# Patient Record
Sex: Male | Born: 1965
Health system: Southern US, Community
[De-identification: ages and names within clinical notes are randomized; demographics above are authoritative.]

## PROBLEM LIST (undated history)

## (undated) DIAGNOSIS — E119 Type 2 diabetes mellitus without complications: Secondary | ICD-10-CM

## (undated) DIAGNOSIS — I10 Essential (primary) hypertension: Secondary | ICD-10-CM

## (undated) DIAGNOSIS — K5090 Crohn's disease, unspecified, without complications: Secondary | ICD-10-CM

## (undated) HISTORY — DX: Crohn's disease, unspecified, without complications: K50.90

## (undated) HISTORY — PX: FINGER AMPUTATION: SHX636

## (undated) HISTORY — PX: SCROTAL SURGERY: SHX2387

---

## 2001-01-10 ENCOUNTER — Emergency Department (HOSPITAL_COMMUNITY): Admission: EM | Admit: 2001-01-10 | Discharge: 2001-01-10 | Payer: Self-pay | Admitting: Emergency Medicine

## 2001-01-10 ENCOUNTER — Encounter: Payer: Self-pay | Admitting: Emergency Medicine

## 2001-12-06 ENCOUNTER — Emergency Department (HOSPITAL_COMMUNITY): Admission: EM | Admit: 2001-12-06 | Discharge: 2001-12-06 | Payer: Self-pay | Admitting: Emergency Medicine

## 2002-03-08 ENCOUNTER — Inpatient Hospital Stay (HOSPITAL_COMMUNITY): Admission: EM | Admit: 2002-03-08 | Discharge: 2002-03-10 | Payer: Self-pay | Admitting: Emergency Medicine

## 2002-03-08 ENCOUNTER — Encounter: Payer: Self-pay | Admitting: Emergency Medicine

## 2002-03-16 ENCOUNTER — Emergency Department (HOSPITAL_COMMUNITY): Admission: EM | Admit: 2002-03-16 | Discharge: 2002-03-17 | Payer: Self-pay

## 2002-03-17 ENCOUNTER — Encounter: Payer: Self-pay | Admitting: Emergency Medicine

## 2002-03-21 ENCOUNTER — Emergency Department (HOSPITAL_COMMUNITY): Admission: EM | Admit: 2002-03-21 | Discharge: 2002-03-21 | Payer: Self-pay | Admitting: Emergency Medicine

## 2002-03-23 ENCOUNTER — Emergency Department (HOSPITAL_COMMUNITY): Admission: EM | Admit: 2002-03-23 | Discharge: 2002-03-23 | Payer: Self-pay | Admitting: Emergency Medicine

## 2002-03-27 ENCOUNTER — Encounter: Admission: RE | Admit: 2002-03-27 | Discharge: 2002-03-27 | Payer: Self-pay | Admitting: Family Medicine

## 2002-03-30 ENCOUNTER — Encounter: Admission: RE | Admit: 2002-03-30 | Discharge: 2002-03-30 | Payer: Self-pay | Admitting: Family Medicine

## 2002-04-24 ENCOUNTER — Ambulatory Visit (HOSPITAL_COMMUNITY): Admission: RE | Admit: 2002-04-24 | Discharge: 2002-04-24 | Payer: Self-pay | Admitting: Sports Medicine

## 2002-06-27 ENCOUNTER — Encounter: Admission: RE | Admit: 2002-06-27 | Discharge: 2002-06-27 | Payer: Self-pay | Admitting: Family Medicine

## 2002-07-10 ENCOUNTER — Encounter: Admission: RE | Admit: 2002-07-10 | Discharge: 2002-10-08 | Payer: Self-pay | Admitting: Family Medicine

## 2006-06-18 ENCOUNTER — Emergency Department (HOSPITAL_COMMUNITY): Admission: EM | Admit: 2006-06-18 | Discharge: 2006-06-18 | Payer: Self-pay | Admitting: Emergency Medicine

## 2006-11-15 DIAGNOSIS — K5090 Crohn's disease, unspecified, without complications: Secondary | ICD-10-CM

## 2006-11-15 HISTORY — DX: Crohn's disease, unspecified, without complications: K50.90

## 2009-01-27 ENCOUNTER — Encounter: Admission: RE | Admit: 2009-01-27 | Discharge: 2009-01-27 | Payer: Self-pay | Admitting: Gastroenterology

## 2009-09-13 ENCOUNTER — Emergency Department (HOSPITAL_COMMUNITY): Admission: EM | Admit: 2009-09-13 | Discharge: 2009-09-13 | Payer: Self-pay | Admitting: Emergency Medicine

## 2010-09-07 ENCOUNTER — Inpatient Hospital Stay (HOSPITAL_COMMUNITY): Admission: EM | Admit: 2010-09-07 | Discharge: 2010-09-10 | Payer: Self-pay | Admitting: Emergency Medicine

## 2010-10-16 ENCOUNTER — Emergency Department (HOSPITAL_COMMUNITY)
Admission: EM | Admit: 2010-10-16 | Discharge: 2010-10-16 | Payer: Self-pay | Source: Home / Self Care | Admitting: Emergency Medicine

## 2010-12-09 ENCOUNTER — Emergency Department (HOSPITAL_COMMUNITY)
Admission: EM | Admit: 2010-12-09 | Discharge: 2010-12-09 | Payer: Self-pay | Source: Home / Self Care | Admitting: Emergency Medicine

## 2010-12-09 LAB — RAPID STREP SCREEN (MED CTR MEBANE ONLY): Streptococcus, Group A Screen (Direct): NEGATIVE

## 2010-12-14 ENCOUNTER — Emergency Department (HOSPITAL_COMMUNITY)
Admission: EM | Admit: 2010-12-14 | Discharge: 2010-12-15 | Payer: Self-pay | Source: Home / Self Care | Admitting: Emergency Medicine

## 2011-01-26 LAB — GLUCOSE, CAPILLARY: Glucose-Capillary: 92 mg/dL (ref 70–99)

## 2011-01-27 LAB — POCT I-STAT, CHEM 8
BUN: 9 mg/dL (ref 6–23)
Calcium, Ion: 1.15 mmol/L (ref 1.12–1.32)
Chloride: 108 mEq/L (ref 96–112)
Creatinine, Ser: 0.9 mg/dL (ref 0.4–1.5)
Glucose, Bld: 188 mg/dL — ABNORMAL HIGH (ref 70–99)
HCT: 51 % (ref 39.0–52.0)
Hemoglobin: 17.3 g/dL — ABNORMAL HIGH (ref 13.0–17.0)
Potassium: 3.4 mEq/L — ABNORMAL LOW (ref 3.5–5.1)
Sodium: 143 mEq/L (ref 135–145)
TCO2: 24 mmol/L (ref 0–100)

## 2011-01-27 LAB — GLUCOSE, CAPILLARY
Glucose-Capillary: 104 mg/dL — ABNORMAL HIGH (ref 70–99)
Glucose-Capillary: 112 mg/dL — ABNORMAL HIGH (ref 70–99)
Glucose-Capillary: 147 mg/dL — ABNORMAL HIGH (ref 70–99)
Glucose-Capillary: 150 mg/dL — ABNORMAL HIGH (ref 70–99)
Glucose-Capillary: 153 mg/dL — ABNORMAL HIGH (ref 70–99)
Glucose-Capillary: 157 mg/dL — ABNORMAL HIGH (ref 70–99)
Glucose-Capillary: 163 mg/dL — ABNORMAL HIGH (ref 70–99)
Glucose-Capillary: 172 mg/dL — ABNORMAL HIGH (ref 70–99)
Glucose-Capillary: 180 mg/dL — ABNORMAL HIGH (ref 70–99)
Glucose-Capillary: 183 mg/dL — ABNORMAL HIGH (ref 70–99)
Glucose-Capillary: 188 mg/dL — ABNORMAL HIGH (ref 70–99)
Glucose-Capillary: 191 mg/dL — ABNORMAL HIGH (ref 70–99)
Glucose-Capillary: 192 mg/dL — ABNORMAL HIGH (ref 70–99)
Glucose-Capillary: 193 mg/dL — ABNORMAL HIGH (ref 70–99)
Glucose-Capillary: 199 mg/dL — ABNORMAL HIGH (ref 70–99)
Glucose-Capillary: 199 mg/dL — ABNORMAL HIGH (ref 70–99)
Glucose-Capillary: 208 mg/dL — ABNORMAL HIGH (ref 70–99)

## 2011-01-27 LAB — COMPREHENSIVE METABOLIC PANEL
ALT: 16 U/L (ref 0–53)
ALT: 18 U/L (ref 0–53)
AST: 14 U/L (ref 0–37)
AST: 19 U/L (ref 0–37)
Albumin: 2.8 g/dL — ABNORMAL LOW (ref 3.5–5.2)
Albumin: 3.7 g/dL (ref 3.5–5.2)
Alkaline Phosphatase: 63 U/L (ref 39–117)
Alkaline Phosphatase: 90 U/L (ref 39–117)
BUN: 10 mg/dL (ref 6–23)
BUN: 10 mg/dL (ref 6–23)
CO2: 24 mEq/L (ref 19–32)
CO2: 25 mEq/L (ref 19–32)
Calcium: 8.1 mg/dL — ABNORMAL LOW (ref 8.4–10.5)
Calcium: 9.2 mg/dL (ref 8.4–10.5)
Chloride: 107 mEq/L (ref 96–112)
Chloride: 108 mEq/L (ref 96–112)
Creatinine, Ser: 0.84 mg/dL (ref 0.4–1.5)
Creatinine, Ser: 0.87 mg/dL (ref 0.4–1.5)
GFR calc Af Amer: >60 mL/min (ref >60)
GFR calc Af Amer: >60 mL/min (ref >60)
GFR calc non Af Amer: >60 mL/min (ref >60)
GFR calc non Af Amer: >60 mL/min (ref >60)
Glucose, Bld: 194 mg/dL — ABNORMAL HIGH (ref 70–99)
Glucose, Bld: 216 mg/dL — ABNORMAL HIGH (ref 70–99)
Potassium: 3.3 mEq/L — ABNORMAL LOW (ref 3.5–5.1)
Potassium: 3.8 mEq/L (ref 3.5–5.1)
Sodium: 139 mEq/L (ref 135–145)
Sodium: 139 mEq/L (ref 135–145)
Total Bilirubin: 0.4 mg/dL (ref 0.3–1.2)
Total Bilirubin: 0.5 mg/dL (ref 0.3–1.2)
Total Protein: 6.2 g/dL (ref 6.0–8.3)
Total Protein: 7.8 g/dL (ref 6.0–8.3)

## 2011-01-27 LAB — SEDIMENTATION RATE: Sed Rate: 29 mm/hr — ABNORMAL HIGH (ref 0–16)

## 2011-01-27 LAB — BASIC METABOLIC PANEL
BUN: 8 mg/dL (ref 6–23)
BUN: 9 mg/dL (ref 6–23)
CO2: 25 mEq/L (ref 19–32)
CO2: 27 mEq/L (ref 19–32)
Calcium: 8 mg/dL — ABNORMAL LOW (ref 8.4–10.5)
Calcium: 8.6 mg/dL (ref 8.4–10.5)
Chloride: 108 mEq/L (ref 96–112)
Chloride: 108 mEq/L (ref 96–112)
Creatinine, Ser: 0.8 mg/dL (ref 0.4–1.5)
Creatinine, Ser: 0.82 mg/dL (ref 0.4–1.5)
GFR calc Af Amer: >60 mL/min (ref >60)
GFR calc Af Amer: >60 mL/min (ref >60)
GFR calc non Af Amer: >60 mL/min (ref >60)
GFR calc non Af Amer: >60 mL/min (ref >60)
Glucose, Bld: 154 mg/dL — ABNORMAL HIGH (ref 70–99)
Glucose, Bld: 163 mg/dL — ABNORMAL HIGH (ref 70–99)
Potassium: 3.6 mEq/L (ref 3.5–5.1)
Potassium: 4 mEq/L (ref 3.5–5.1)
Sodium: 141 mEq/L (ref 135–145)
Sodium: 141 mEq/L (ref 135–145)

## 2011-01-27 LAB — LACTIC ACID, PLASMA
Lactic Acid, Venous: 1.1 mmol/L (ref 0.5–2.2)
Lactic Acid, Venous: 2.5 mmol/L — ABNORMAL HIGH (ref 0.5–2.2)

## 2011-01-27 LAB — URINALYSIS, ROUTINE W REFLEX MICROSCOPIC
Glucose, UA: 250 mg/dL — AB
Ketones, ur: 15 mg/dL — AB
Leukocytes, UA: NEGATIVE
Nitrite: NEGATIVE
Protein, ur: 30 mg/dL — AB
Specific Gravity, Urine: 1.038 — ABNORMAL HIGH (ref 1.005–1.030)
Urobilinogen, UA: 1 mg/dL (ref 0.0–1.0)
pH: 5.5 (ref 5.0–8.0)

## 2011-01-27 LAB — CBC
HCT: 39.7 % (ref 39.0–52.0)
HCT: 41.3 % (ref 39.0–52.0)
HCT: 41.5 % (ref 39.0–52.0)
HCT: 47.7 % (ref 39.0–52.0)
Hemoglobin: 13.3 g/dL (ref 13.0–17.0)
Hemoglobin: 13.8 g/dL (ref 13.0–17.0)
Hemoglobin: 14 g/dL (ref 13.0–17.0)
Hemoglobin: 16.1 g/dL (ref 13.0–17.0)
MCH: 28.8 pg (ref 26.0–34.0)
MCH: 29.1 pg (ref 26.0–34.0)
MCH: 29.1 pg (ref 26.0–34.0)
MCH: 29.2 pg (ref 26.0–34.0)
MCHC: 33.1 g/dL (ref 30.0–36.0)
MCHC: 33.6 g/dL (ref 30.0–36.0)
MCHC: 33.7 g/dL (ref 30.0–36.0)
MCHC: 33.9 g/dL (ref 30.0–36.0)
MCV: 86.2 fL (ref 78.0–100.0)
MCV: 86.3 fL (ref 78.0–100.0)
MCV: 86.8 fL (ref 78.0–100.0)
MCV: 87 fL (ref 78.0–100.0)
Platelets: 259 10*3/uL (ref 150–400)
Platelets: 270 10*3/uL (ref 150–400)
Platelets: 274 10*3/uL (ref 150–400)
Platelets: 286 10*3/uL (ref 150–400)
RBC: 4.57 MIL/uL (ref 4.22–5.81)
RBC: 4.77 MIL/uL (ref 4.22–5.81)
RBC: 4.79 MIL/uL (ref 4.22–5.81)
RBC: 5.53 MIL/uL (ref 4.22–5.81)
RDW: 14.7 % (ref 11.5–15.5)
RDW: 15.1 % (ref 11.5–15.5)
RDW: 15.3 % (ref 11.5–15.5)
RDW: 15.4 % (ref 11.5–15.5)
WBC: 10.4 10*3/uL (ref 4.0–10.5)
WBC: 10.7 10*3/uL — ABNORMAL HIGH (ref 4.0–10.5)
WBC: 15.5 10*3/uL — ABNORMAL HIGH (ref 4.0–10.5)
WBC: 9.1 10*3/uL (ref 4.0–10.5)

## 2011-01-27 LAB — LIPASE, BLOOD: Lipase: 28 U/L (ref 11–59)

## 2011-01-27 LAB — TYPE AND SCREEN
ABO/RH(D): B POS
Antibody Screen: NEGATIVE

## 2011-01-27 LAB — PROTIME-INR
INR: 1.02 (ref 0.00–1.49)
INR: 1.15 (ref 0.00–1.49)
Prothrombin Time: 13.6 seconds (ref 11.6–15.2)
Prothrombin Time: 14.9 seconds (ref 11.6–15.2)

## 2011-01-27 LAB — MAGNESIUM: Magnesium: 2 mg/dL (ref 1.5–2.5)

## 2011-01-27 LAB — URINE MICROSCOPIC-ADD ON

## 2011-01-27 LAB — DIFFERENTIAL
Basophils Absolute: 0 10*3/uL (ref 0.0–0.1)
Basophils Relative: 0 % (ref 0–1)
Eosinophils Absolute: 0 10*3/uL (ref 0.0–0.7)
Eosinophils Relative: 0 % (ref 0–5)
Lymphocytes Relative: 5 % — ABNORMAL LOW (ref 12–46)
Lymphs Abs: 0.8 10*3/uL (ref 0.7–4.0)
Monocytes Absolute: 0.8 10*3/uL (ref 0.1–1.0)
Monocytes Relative: 5 % (ref 3–12)
Neutro Abs: 13.8 10*3/uL — ABNORMAL HIGH (ref 1.7–7.7)
Neutrophils Relative %: 89 % — ABNORMAL HIGH (ref 43–77)

## 2011-01-27 LAB — HEMOGLOBIN A1C
Hgb A1c MFr Bld: 8.6 % — ABNORMAL HIGH (ref <5.7)
Mean Plasma Glucose: 200 mg/dL — ABNORMAL HIGH (ref <117)

## 2011-01-27 LAB — APTT: aPTT: 28 seconds (ref 24–37)

## 2011-01-27 LAB — ABO/RH: ABO/RH(D): B POS
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
## 2011-04-02 NOTE — Discharge Summary (Signed)
[place]. [facility]  Patient:    Jared [name], Jared [name] Visit Number: [id number] MRN: [id number]          Service Type: MED Location: [place] Attending Physician:  Tiburcio Pea Dictated by:   Early Chars. Waymon Amato, M.D. Admit Date:  03/08/2002 Discharge Date: 03/10/2002   CC:         Kelle Darting, M.D.   Discharge Summary  DATE OF BIRTH:  12-09-1965.  DISCHARGE DIAGNOSES: 1. Headache. 2. Viral meningitis. 3. Questionable elevated glucose with family history of diabetes.  PROCEDURES:  Patient underwent a head CT scan which was negative on March 08, 2002 and a lumbar puncture with results as reported below.  DISCHARGE MEDICATIONS: 1. Ibuprofen 800 mg one tablet p.o. q.6-8h. p.r.n. with food. 2. Vicodin ES one tablet p.o. q.6h. p.r.n. as needed for pain. 3. Ultram 75 mg one tablet p.o. q.6-8h. p.r.n. pain.  ACTIVITY:  Bedrest, no heavy lifting, no straining or vigorous exercise for three to four days or until well.  HOSPITAL FOLLOW UP:  The patient is instructed to call [facility] for an appointment with either Dr. Anitra Lauth or Dr. Waymon Amato in four to five business days.  If patient worsens he is to contact [facility] or return to the emergency department at that time.  BRIEF HISTORY OF PRESENT ILLNESS:  The patient is a 45 year old African-American male who presented to [facility] emergency department with headache times two days that was progressively worsening. Upon arrival to the emergency department he rated the pain as 20:10, felt like pain going through his eyeballs and his neck.  He did admit to mild photophobia but no other neurological changes by his report.  The patient was evaluated  and it was considered that he potentially had an acute neurological change consistent with interventricular or intervascular hemorrhage or meningitis versus stroke, etc.  LABORATORY DATA:  Initial blood  work showed normal BMP except for glucose of 138.  Blood cultures were drawn.  CSF showed no organisms, no red blood cells, 345 white blood cells, 82% lymphocytes, protein was 65 and glucose 75.  The patient was thus admitted to [place] for observation and awaiting culture return.  HOSPITAL COURSE: #1 - HEADACHE:  By the day of discharge the patient reports his pain is still about 5 to 6:10, however, it was improved when he was given proper pain control.  The patient was controlled on morphine until yesterday when he was switched over to Percocet.  He did not feel the Percocet was adequately cont6rolling his pain.  Cultures had not returned by date of discharge, results are still pending.  The patients white blood cell count was 9.3 on admission and initial CSF cultures showed no organisms present at 24 hours and thus it was felt the patient was a good candidate for discharge.  #2 - ELEVATED GLUCOSE LEVEL:  On admission the patient had a glucose level of 138 on his BMP, was 136 on comprehensive metabolic panel later that evening and he had a value of 151 on March 09, 2002 at 4 a.m. when he was fasting which is suggestive of diabetes mellitus or glucose intolerance consistent with the patients family history of multiple members having diabetes mellitus. DISCHARGE HISTORY AND PHYSICAL EXAMINATION:  The patient is doing well today. He tolerated p.o. food and p.o. pain medications fairly well considering his viral meningitis.  DISPOSITION:  The patient will be  discharge in stable condition today to home and will have follow up with [facility].  The patient is given adequate prescriptions and will be added in Ultram 75 mg one p.o. q.6-8h. p.r.n. pain.  He will be given #15 of the Ultram and #30 of the ibuprofen, #30 of Vicodin. Dictated by:   Early Chars. Waymon Amato, M.D. Attending Physician:  Tiburcio Pea DD:  03/10/02 TD:   03/12/02 Job: [phone]

## 2011-07-14 ENCOUNTER — Emergency Department (HOSPITAL_COMMUNITY): Payer: Self-pay

## 2011-07-14 ENCOUNTER — Inpatient Hospital Stay (HOSPITAL_COMMUNITY)
Admission: EM | Admit: 2011-07-14 | Discharge: 2011-07-16 | DRG: 386 | Disposition: A | Discharge status: Home / Self Care | Payer: Self-pay | Attending: Internal Medicine | Admitting: Internal Medicine

## 2011-07-14 DIAGNOSIS — F17200 Nicotine dependence, unspecified, uncomplicated: Secondary | ICD-10-CM | POA: Diagnosis present

## 2011-07-14 DIAGNOSIS — E119 Type 2 diabetes mellitus without complications: Secondary | ICD-10-CM | POA: Diagnosis present

## 2011-07-14 DIAGNOSIS — K5669 Other intestinal obstruction: Secondary | ICD-10-CM | POA: Diagnosis present

## 2011-07-14 DIAGNOSIS — K56609 Unspecified intestinal obstruction, unspecified as to partial versus complete obstruction: Secondary | ICD-10-CM

## 2011-07-14 DIAGNOSIS — K5000 Crohn's disease of small intestine without complications: Principal | ICD-10-CM | POA: Diagnosis present

## 2011-07-14 DIAGNOSIS — K5090 Crohn's disease, unspecified, without complications: Secondary | ICD-10-CM

## 2011-07-14 DIAGNOSIS — E871 Hypo-osmolality and hyponatremia: Secondary | ICD-10-CM | POA: Diagnosis present

## 2011-07-14 LAB — GLUCOSE, CAPILLARY
Glucose-Capillary: 221 mg/dL — ABNORMAL HIGH (ref 70–99)
Glucose-Capillary: 103 mg/dL — ABNORMAL HIGH (ref 70–99)
Glucose-Capillary: 107 mg/dL — ABNORMAL HIGH (ref 70–99)

## 2011-07-14 LAB — CBC
HCT: 44.5 % (ref 39.0–52.0)
Hemoglobin: 15.1 g/dL (ref 13.0–17.0)
MCH: 28.6 pg (ref 26.0–34.0)
MCHC: 33.9 g/dL (ref 30.0–36.0)
MCV: 84.3 fL (ref 78.0–100.0)
Platelets: 298 10*3/uL (ref 150–400)
RBC: 5.28 MIL/uL (ref 4.22–5.81)
RDW: 13.4 % (ref 11.5–15.5)
WBC: 11.9 10*3/uL — ABNORMAL HIGH (ref 4.0–10.5)

## 2011-07-14 LAB — DIFFERENTIAL
Basophils Absolute: 0 10*3/uL (ref 0.0–0.1)
Basophils Relative: 0 % (ref 0–1)
Eosinophils Absolute: 0 10*3/uL (ref 0.0–0.7)
Eosinophils Relative: 0 % (ref 0–5)
Lymphocytes Relative: 17 % (ref 12–46)
Lymphs Abs: 2 10*3/uL (ref 0.7–4.0)
Monocytes Absolute: 1.1 10*3/uL — ABNORMAL HIGH (ref 0.1–1.0)
Monocytes Relative: 9 % (ref 3–12)
Neutro Abs: 8.8 10*3/uL — ABNORMAL HIGH (ref 1.7–7.7)
Neutrophils Relative %: 74 % (ref 43–77)

## 2011-07-14 LAB — HEPATIC FUNCTION PANEL
ALT: 37 U/L (ref 0–53)
AST: 17 U/L (ref 0–37)
Albumin: 3.4 g/dL — ABNORMAL LOW (ref 3.5–5.2)
Alkaline Phosphatase: 95 U/L (ref 39–117)
Bilirubin, Direct: 0.1 mg/dL (ref 0.0–0.3)
Indirect Bilirubin: 0.3 mg/dL (ref 0.3–0.9)
Total Bilirubin: 0.4 mg/dL (ref 0.3–1.2)
Total Protein: 7.3 g/dL (ref 6.0–8.3)

## 2011-07-14 LAB — LIPASE, BLOOD: Lipase: 34 U/L (ref 11–59)

## 2011-07-14 LAB — LACTIC ACID, PLASMA: Lactic Acid, Venous: 1.1 mmol/L (ref 0.5–2.2)

## 2011-07-14 NOTE — H&P (Signed)
Jared [name], Jared [name]               ACCOUNT NO.:  [id number]  MEDICAL RECORD NO.:  [id number]  LOCATION:  WLED                         FACILITY:  [facility]  PHYSICIAN:  Quintella Baton, MD      DATE OF BIRTH:  1966-01-27  DATE OF ADMISSION:  07/14/2011 DATE OF DISCHARGE:                             HISTORY & PHYSICAL   PCP:  None.  CODE STATUS:  FULL CODE.  The patient goes to team 2.  CHIEF COMPLAINTS:  Abdominal pain.  HISTORY OF PRESENT ILLNESS:  This is a 45 year old gentleman who has a history of Crohn disease as well as a history of small bowel obstruction once, approximately a year ago.  He states that today he developed abdominal pain and bloating, it was present all day; however, over the last couple of hours this has got severe, rated 10/10, sharp, crampy, and intermittent.  He had no nausea, no vomiting, no diarrhea, no fevers, no chills.  He states that the pain was epigastric in location and radiated down to suprapubic area centrally.  He finally decided to come to the ER.  He is on no medication for Crohn disease.  He does not follow with a gastroenterologist.  History obtained from the patient who appears reliable.  PAST MEDICAL HISTORY: 1. Crohn disease. 2. Diabetes mellitus.  PAST SURGICAL HISTORY:  None.  MEDICATIONS:  Metformin.  ALLERGIES:  No known drug allergies.  SOCIAL HISTORY:  Positive for tobacco.  Negative alcohol or illicit drugs.  No home oxygen.  Does use a walker, a cane.  FAMILY HISTORY:  Significant for diabetes mellitus and hypertension.  REVIEW OF SYSTEMS:  All 10-point systems reviewed and negative except as noted in HPI.  PHYSICAL EXAMINATION:  VITAL SIGNS:  Blood pressure 111/67, pulse 79, respirations 24, temperature 97.5, and satting 100% on room air. GENERAL:  Alert and oriented male in mild distress. HEENT:  Eyes, pink conjunctiva.  PERRLA.  ENT, moist oral mucosa. Trachea midline. NECK:  Supple.  No thyromegaly. LUNGS:   Clear to auscultation bilaterally.  No use of accessory muscles. No wheezing. CARDIOVASCULAR:  Regular rate and rhythm without murmurs, rigors, or gallops.  No JVD. ABDOMEN:  Soft.  No bowel sounds appreciated, slightly distended, positive tenderness to palpation.  Generally, no guarding and rebound, not acute surgical abdomen. NEURO:  Cranial nerves II through XII grossly intact.  Sensation intact. MUSCULOSKELETAL:  Strength 5/5 in all extremities.  No clubbing, cyanosis, or edema. SKIN:  No rash.  No subcutaneous crepitation.  LABORATORY DATA:  Lipase 34.  LFTs are normal.  Lactic acid 1.1.  White blood count 7.9, hemoglobin 15.1, and platelets 298.  KUB:  Dilated small bowel sounds, concerning for small bowel obstruction.  ASSESSMENT AND PLAN: 1. Abdominal pain. 2. Likely small bowel obstruction.  The patient will be admitted,     nothing by mouth, intravenous fluids, pain medications will be     ordered.  Dr. Lucia Gaskins, the surgeon will be called by the emergency     room physician. 3. History of Crohn disease.  The patient is on no medications for his     Crohn disease.  However, this seems to be stable.  A CAT scan in     the morning, if desired by morning team. 4. Diabetes mellitus.  The patient will be n.p.o.  We will place the     patient on sliding scale insulin q.4 hours.  Metformin will be     held.          ______________________________ Quintella Baton, MD     DC/MEDQ  D:  07/14/2011  T:  07/14/2011  Job:  [id number]  Electronically Signed by Quintella Baton MD on 07/14/2011 07:44:21 PM

## 2011-07-15 ENCOUNTER — Inpatient Hospital Stay (HOSPITAL_COMMUNITY): Payer: Self-pay

## 2011-07-15 LAB — CBC
HCT: 41.7 % (ref 39.0–52.0)
Hemoglobin: 14 g/dL (ref 13.0–17.0)
MCH: 28.4 pg (ref 26.0–34.0)
MCHC: 33.6 g/dL (ref 30.0–36.0)
MCV: 84.6 fL (ref 78.0–100.0)
Platelets: 257 10*3/uL (ref 150–400)
RBC: 4.93 MIL/uL (ref 4.22–5.81)
RDW: 13.3 % (ref 11.5–15.5)
WBC: 8.2 10*3/uL (ref 4.0–10.5)

## 2011-07-15 LAB — GLUCOSE, CAPILLARY
Glucose-Capillary: 173 mg/dL — ABNORMAL HIGH (ref 70–99)
Glucose-Capillary: 184 mg/dL — ABNORMAL HIGH (ref 70–99)
Glucose-Capillary: 207 mg/dL — ABNORMAL HIGH (ref 70–99)
Glucose-Capillary: 143 mg/dL — ABNORMAL HIGH (ref 70–99)
Glucose-Capillary: 173 mg/dL — ABNORMAL HIGH (ref 70–99)
Glucose-Capillary: 224 mg/dL — ABNORMAL HIGH (ref 70–99)

## 2011-07-15 LAB — BASIC METABOLIC PANEL
BUN: 14 mg/dL (ref 6–23)
CO2: 24 mEq/L (ref 19–32)
Calcium: 8.8 mg/dL (ref 8.4–10.5)
Chloride: 101 mEq/L (ref 96–112)
Creatinine, Ser: 0.83 mg/dL (ref 0.50–1.35)
GFR calc Af Amer: >60 mL/min (ref >60)
GFR calc non Af Amer: >60 mL/min (ref >60)
Glucose, Bld: 197 mg/dL — ABNORMAL HIGH (ref 70–99)
Potassium: 4.3 mEq/L (ref 3.5–5.1)
Sodium: 134 mEq/L — ABNORMAL LOW (ref 135–145)

## 2011-07-16 LAB — GLUCOSE, CAPILLARY
Glucose-Capillary: 245 mg/dL — ABNORMAL HIGH (ref 70–99)
Glucose-Capillary: 231 mg/dL — ABNORMAL HIGH (ref 70–99)
Glucose-Capillary: 264 mg/dL — ABNORMAL HIGH (ref 70–99)

## 2011-07-20 NOTE — Discharge Summary (Signed)
Jared [name], Jared [name]               ACCOUNT NO.:  [id number]  MEDICAL RECORD NO.:  [id number]  LOCATION:  8676                         FACILITY:  [facility]  PHYSICIAN:  Jacquelynn Cree, M.D.   DATE OF BIRTH:  Feb 20, 1966  DATE OF ADMISSION:  07/14/2011 DATE OF DISCHARGE:  07/16/2011                              DISCHARGE SUMMARY   PRIMARY CARE PHYSICIAN:  Dr. Maryan Rued at [facility]  GASTROENTEROLOGIST:  Dr. Amedeo Plenty.  DISCHARGE DIAGNOSES: 1. Small bowel obstruction secondary to Crohn disease flare. 2. Crohn disease. 3. Type 2 diabetes. 4. Mild hyponatremia.  DISCHARGE MEDICATIONS: 1. Pentasa 1000 mg p.o. q.i.d. for 8 weeks. 2. Prednisone 40 mg times 1 week then 30 mg times 1 week then 20 mg     times 1 week then 10 mg times 1 week. 3. Vicodin 5/500 1-2 tablets p.o. q.6 h p.r.n. pain. 4. Lasix at preadmission dosage 1 tablet p.o. daily. 5. Metformin 850 mg p.o. b.i.d.  CONSULTATIONS:  Wonda Horner, M.D. of Gastroenterology. Dr. Redmond Pulling of General Surgery.  BRIEF ADMISSION HISTORY OF PRESENT ILLNESS:  The patient is a 45 year old male past medical history of Crohn disease of the terminal ileum, who presented to the hospital with chief complaint of abdominal pain and upon initial evaluation, was found to have a small bowel obstruction. Subsequently, he was referred to the hospitalist service for further evaluation and treatment.  For the full details, please see the dictated report done by Dr. Claria Dice.  PROCEDURES AND DIAGNOSTIC STUDIES: 1. Acute abdominal series on July 14, 2011, showed dilated loops of     small bowel concerning for small bowel obstruction. 2. Two views of the abdomen on July 15, 2011, showed normal bowel     gas pattern with no acute findings.  DISCHARGE LABORATORY VALUES:  Sodium was 134, potassium 4.3, chloride 101, bicarb 24, BUN 14, creatinine 0.83, glucose 197, calcium 8.8. White blood cell count was 8.2, hemoglobin 14, hematocrit 41.7, platelets  257.  Lipase was 34.  Lactic acid was 1.1.  HOSPITAL COURSE BY PROBLEM: 1. Small bowel obstruction secondary to Crohn disease flare:  The     patient was admitted and an NG tube was placed for gastric     decompression.  This was promptly removed as the patient did not     have any preadmission nausea or vomiting and the NG tube was not     draining a significant amount of gastric material.  The patient was     treated symptomatically with steroids and had dramatic improvement     within 24 hours.  His follow up abdominal films showed resolution     of the small bowel obstruction and his diet was slowly advanced.     At this point, GI does recommend that he follow up next week with     them and that he be discharged on 40 mg of prednisone daily with a     slow taper.  They also recommended Pentasa and indicated that he     could possibly get samples from [facility] or Dr. Amedeo Plenty office.     The patient will follow up with [facility] as an outpatient for  further treatment recommendations. 2. Type 2 diabetes:  The patient was maintained on insulin therapy     while in the hospital.  He can resume metformin at discharge. 3. Mild hyponatremia:  Likely reflective of the patient's elevated     blood glucoses.  No further diagnostic evaluation was undertaken     given his extremely mild.  DISPOSITION:  The patient is medically stable and be discharged home later today as long as he tolerates advancement of diet.  DISCHARGE INSTRUCTIONS:  Activity:  No restrictions.  Diet:  As tolerated, diabetic.  Start with full liquids and slowly advance to solid as tolerated.  Follow up:  With Dr. Maryan Rued at [facility] on July 27, 2011, at 10:30 a.m.  Follow up with [facility] as needed.  Follow up with Dr. Watt Climes and Dr. Amedeo Plenty at [place] in 1-2 weeks, call for an appointment.  The patient has been instructed to call either Dr. Amedeo Plenty or [facility] to see if they have any  Pentasa samples on hand.  The patient indicates that he does have a supply of this at home and he will be provided with 3 days per the indigent fund.  Time spent coordinating care for discharge and discharge instructions including face-to-face time is approximately 35 minutes.     Jacquelynn Cree, M.D.     CR/MEDQ  D:  07/16/2011  T:  07/16/2011  Job:  [id number]  cc:   Dr. Maryan Rued  Dr. Amedeo Plenty  Electronically Signed by Jacquelynn Cree M.D. on 07/20/2011 05:23:58 PM

## 2011-07-28 NOTE — Consult Note (Signed)
  NAMEJAKORIAN, Jared [name] NO.:  [id number]  MEDICAL RECORD NO.:  [id number]  LOCATION:  4628                         FACILITY:  [place]:  Wonda Horner, M.D.   DATE OF BIRTH:  Apr 06, 1966  DATE OF CONSULTATION:  07/14/2011 DATE OF DISCHARGE:                                CONSULTATION   REASON FOR CONSULTATION:  The patient is a 45 year old black male, who has a history of Crohn disease of the terminal ileum.  He presented to the hospital with complaints of abdominal pain and abdominal x-ray revealed evidence of dilated loops of small bowel concerning for small bowel obstruction.  Patient had an NG tube placed, but it is out now. This has happened to him before and last year he was in the hospital for partial small bowel obstruction and was treated with steroids and sent home on a prednisone taper.  Patient states that he has not been on any medications at all for Crohn disease in quite some time.  His last steroid dose was some time around the end of December of last year. Patient has seen Dr. Amedeo Plenty in the office in the past, however, has not seen him in 2-1/2 years.  Patient has not been on any Crohn medication and apparently did not show up for a couple of appointments in the past at the office and now we are seeing him again in the hospital.  He was seen last year in the hospital by Dr. Watt Climes in consultation for this similar problem of small bowel obstruction related to Crohn disease.  PAST MEDICAL HISTORY:  Diabetes.  PRIOR SURGERIES:  None.  MEDICATIONS:  Metformin.  ALLERGIES:  None known.  SOCIAL HISTORY:  Smokes cigarettes.  Does not drink alcohol or use any drugs.  REVIEW OF SYSTEMS:  No complaints of chest pain, shortness of breath, cough, or sputum production.  PHYSICAL EXAMINATION:  VITAL SIGNS:  Stable. GENERAL:  He does not appear in any acute distress. HEENT:  Nonicteric. HEART:  Regular rhythm.  No murmurs. LUNGS:   Clear. ABDOMEN:  Soft, nontender.  LABORATORY DATA:  White blood cell count 11,900, hemoglobin 15.1.  Acute abdomen series shows dilated loops of small bowel concerning for small bowel obstruction.  IMPRESSION:  Small bowel obstruction related to Crohn ileitis, would be the most likely etiology at this time.  PLAN:  I would recommend starting Solu-Medrol 40 mg IV b.i.d. and then when he improves more, switch him over to prednisone.          ______________________________ Wonda Horner, M.D.     SFG/MEDQ  D:  07/14/2011  T:  07/14/2011  Job:  [id number]  cc:   [place]. Amedeo Plenty, M.D. Fax: [phone]  Electronically Signed by Anson Fret MD on 07/28/2011 08:55:13 AM

## 2011-07-30 NOTE — Consult Note (Signed)
Jared [name], Jared [name]               ACCOUNT NO.:  [id number]  MEDICAL RECORD NO.:  [id number]  LOCATION:  36                         FACILITY:  [facility]  PHYSICIAN:  Leighton Ruff. Redmond Pulling, MD     DATE OF BIRTH:  Oct 04, 1966  DATE OF CONSULTATION:  07/14/2011 DATE OF DISCHARGE:                                CONSULTATION   REFERRING PHYSICIAN:  Jacquelynn Cree, M.D.  CONSULTING SURGEON:  Leighton Ruff. Redmond Pulling, MD.  GASTROENTEROLOGIST:  Elyse Jarvis. Amedeo Plenty, M.D.  REASON FOR CONSULTATION:  Small bowel obstruction.  HISTORY OF PRESENT ILLNESS:  Jared [name] is a 45 year old black male with a history of Crohn disease and diabetes mellitus, who developed partial small bowel obstruction last year secondary to his Crohn disease.  He was medically treated with Crohn's medication and it ultimately resolved.  The patient has been doing well since then.  He took his normal Crohn's medication for a month after discharge, but has since not taken any medication.  He has not followed up with the gastroenterologist as he does not have any insurance.  Yesterday, the patient noticed he has some abdominal bloating.  His pain started later in the day and progressively worsened in the epigastric and suprapubic region as well as the lower and mid abdomen late last night around 12 p.m.  The patient denies any nausea or vomiting.  He is still passing flatus and had a bowel movement yesterday.  He presented to the emergency department for further evaluation.  He had an abdominal x-ray which revealed a small bowel obstruction.  The patient was then admitted.  We have been asked to evaluate the patient.  REVIEW OF SYSTEMS:  Please see HPI.  Otherwise, all other systems have been reviewed and are negative.  FAMILY HISTORY:  Noncontributory.  PAST MEDICAL HISTORY: 1. Crohn disease with a history of a partial small bowel obstruction. 2. Diabetes mellitus.  PAST SURGICAL HISTORY:  None.  SOCIAL HISTORY:  The patient is  single.  He is a Administrator.  He admits to smoking approximately 1 cigarette every 3 days.  He rarely uses alcohol.  ALLERGIES:  NKDA.  MEDICATIONS AT HOME:  Metformin.  PHYSICAL EXAMINATION:  GENERAL:  Jared [name] is a pleasant 45 year old black male, who is well developed, well nourished, and currently lying in bed in no acute distress. VITAL SIGNS:  Temperature 98.2, pulse 77, respirations 17, blood pressure 104/66. HEENT:  Head is normocephalic, atraumatic.  Sclerae noninjected.  Pupils are equal, round, and reactive to light.  Ears and nose without any obvious masses or lesions.  No rhinorrhea.  However, the patient does have an NG tube present with no output.  His mouth is pink.  Throat shows no exudate. HEART:  Regular rate and rhythm.  Normal S1 and S2.  No murmurs, gallops, or rubs are noted.  He does have palpable carotid, radial, and pedal pulses bilaterally. LUNGS:  Clear to auscultation bilaterally.  No wheezes, rhonchi, or rales noted.  Respirations nonlabored. ABDOMEN:  Soft with moderate diffuse tenderness, but no peritoneal signs, guarding, or rebounding.  He does have active bowel sounds.  He is, otherwise, mildly distended.  No masses, hernias, organomegaly, or  previous scars are noted. MUSCULOSKELETAL:  All 4 extremities are symmetrical with no cyanosis, clubbing, or edema. SKIN:  Warm and dry with no masses, lesions, or rashes. PSYCH:  The patient is alert and oriented x3 with an appropriate affect.  LABS:  Lipase is 34.  LFTs are unremarkable.  White blood cell count is 11,900, hemoglobin 15.1, hematocrit 44.5, platelet count is 298,000.  DIAGNOSTICS:  Abdominal film reveals dilated small bowel up to 5.2 cm with air-fluid level.  There is some gas seen within the colon, but is otherwise relatively decompressed.  This is suspicious for partial small bowel obstruction.  IMPRESSION: 1. Partial small bowel obstruction, likely secondary to Crohn disease. 2.  Diabetes mellitus.  PLAN:  Currently, the patient is without any nausea or vomiting.  He is still passing some flatus.  He does have an NG tube placed; however, this has had no output at all.  It has been checked and it is in the correct location.  At this time, we will discontinue this as it appears the patient does not currently need it.  We will, otherwise, continue him NPO and on bowel rest.  We agree with a Gastroenterology consultation as this is likely secondary to his Crohn disease.  The patient has no past surgical history that would indicate this is related to adhesive disease.  We will repeat abdominal films in the morning.  The patient may need steroids versus some other type of Crohn's medications; however, we will defer this to Gastroenterology. We will follow the patient.     Henreitta Cea, PA   ______________________________ Leighton Ruff. Redmond Pulling, MD    [place]  D:  07/14/2011  T:  07/15/2011  Job:  [id number]  cc:   Jacquelynn Cree, M.D.  [place] [facility]. Amedeo Plenty, M.D. Fax: [phone]  Electronically Signed by Saverio Danker PA on 07/21/2011 11:51:15 AM Electronically Signed by Greer Pickerel M.D. on 07/30/2011 08:01:35 AM

## 2011-10-22 ENCOUNTER — Ambulatory Visit (INDEPENDENT_AMBULATORY_CARE_PROVIDER_SITE_OTHER): Payer: Self-pay | Admitting: Surgery

## 2011-10-29 ENCOUNTER — Encounter (INDEPENDENT_AMBULATORY_CARE_PROVIDER_SITE_OTHER): Payer: Self-pay | Admitting: Surgery

## 2011-12-27 ENCOUNTER — Telehealth (INDEPENDENT_AMBULATORY_CARE_PROVIDER_SITE_OTHER): Payer: Self-pay

## 2011-12-27 NOTE — Telephone Encounter (Signed)
LMOM for sister Peaches to get the pt Jared [name] to call me back b/c Im trying to cancel his appt with  Dr Johney Maine. The pt does not have any good phone #'s to reach him on.

## 2011-12-28 ENCOUNTER — Ambulatory Visit (INDEPENDENT_AMBULATORY_CARE_PROVIDER_SITE_OTHER): Payer: Self-pay | Admitting: Surgery

## 2012-01-13 ENCOUNTER — Encounter (INDEPENDENT_AMBULATORY_CARE_PROVIDER_SITE_OTHER): Payer: Self-pay | Admitting: General Surgery

## 2012-01-17 ENCOUNTER — Ambulatory Visit (INDEPENDENT_AMBULATORY_CARE_PROVIDER_SITE_OTHER): Payer: Self-pay | Admitting: General Surgery

## 2012-01-20 ENCOUNTER — Encounter (HOSPITAL_COMMUNITY): Payer: Self-pay | Admitting: Emergency Medicine

## 2012-01-20 ENCOUNTER — Emergency Department (HOSPITAL_COMMUNITY): Payer: Self-pay

## 2012-01-20 ENCOUNTER — Inpatient Hospital Stay (HOSPITAL_COMMUNITY)
Admission: EM | Admit: 2012-01-20 | Discharge: 2012-02-08 | DRG: 329 | Disposition: A | Discharge status: Home Health | Payer: Self-pay | Attending: Internal Medicine | Admitting: Internal Medicine

## 2012-01-20 DIAGNOSIS — K56600 Partial intestinal obstruction, unspecified as to cause: Secondary | ICD-10-CM

## 2012-01-20 DIAGNOSIS — K651 Peritoneal abscess: Secondary | ICD-10-CM | POA: Diagnosis not present

## 2012-01-20 DIAGNOSIS — R82998 Other abnormal findings in urine: Secondary | ICD-10-CM | POA: Diagnosis not present

## 2012-01-20 DIAGNOSIS — R824 Acetonuria: Secondary | ICD-10-CM | POA: Diagnosis not present

## 2012-01-20 DIAGNOSIS — R000 Tachycardia, unspecified: Secondary | ICD-10-CM | POA: Diagnosis not present

## 2012-01-20 DIAGNOSIS — F17200 Nicotine dependence, unspecified, uncomplicated: Secondary | ICD-10-CM | POA: Diagnosis present

## 2012-01-20 DIAGNOSIS — S68118A Complete traumatic metacarpophalangeal amputation of other finger, initial encounter: Secondary | ICD-10-CM

## 2012-01-20 DIAGNOSIS — K929 Disease of digestive system, unspecified: Secondary | ICD-10-CM | POA: Diagnosis not present

## 2012-01-20 DIAGNOSIS — T8140XA Infection following a procedure, unspecified, initial encounter: Secondary | ICD-10-CM | POA: Diagnosis not present

## 2012-01-20 DIAGNOSIS — Z79899 Other long term (current) drug therapy: Secondary | ICD-10-CM

## 2012-01-20 DIAGNOSIS — A419 Sepsis, unspecified organism: Secondary | ICD-10-CM | POA: Diagnosis not present

## 2012-01-20 DIAGNOSIS — Z91199 Patient's noncompliance with other medical treatment and regimen due to unspecified reason: Secondary | ICD-10-CM

## 2012-01-20 DIAGNOSIS — K56609 Unspecified intestinal obstruction, unspecified as to partial versus complete obstruction: Secondary | ICD-10-CM | POA: Diagnosis present

## 2012-01-20 DIAGNOSIS — Z9119 Patient's noncompliance with other medical treatment and regimen: Secondary | ICD-10-CM

## 2012-01-20 DIAGNOSIS — Z9889 Other specified postprocedural states: Secondary | ICD-10-CM

## 2012-01-20 DIAGNOSIS — E119 Type 2 diabetes mellitus without complications: Secondary | ICD-10-CM | POA: Diagnosis present

## 2012-01-20 DIAGNOSIS — K5090 Crohn's disease, unspecified, without complications: Principal | ICD-10-CM | POA: Diagnosis present

## 2012-01-20 DIAGNOSIS — E876 Hypokalemia: Secondary | ICD-10-CM | POA: Diagnosis not present

## 2012-01-20 DIAGNOSIS — K560 Paralytic ileus: Secondary | ICD-10-CM | POA: Diagnosis not present

## 2012-01-20 HISTORY — DX: Type 2 diabetes mellitus without complications: E11.9

## 2012-01-20 LAB — GLUCOSE, CAPILLARY
Glucose-Capillary: 177 mg/dL — ABNORMAL HIGH (ref 70–99)
Glucose-Capillary: 171 mg/dL — ABNORMAL HIGH (ref 70–99)
Glucose-Capillary: 190 mg/dL — ABNORMAL HIGH (ref 70–99)
Glucose-Capillary: 188 mg/dL — ABNORMAL HIGH (ref 70–99)

## 2012-01-20 LAB — CBC
HCT: 44.9 % (ref 39.0–52.0)
Hemoglobin: 15.9 g/dL (ref 13.0–17.0)
MCH: 29.6 pg (ref 26.0–34.0)
MCHC: 35.4 g/dL (ref 30.0–36.0)
MCV: 83.5 fL (ref 78.0–100.0)
Platelets: 266 10*3/uL (ref 150–400)
RBC: 5.38 MIL/uL (ref 4.22–5.81)
RDW: 14 % (ref 11.5–15.5)
WBC: 8.4 10*3/uL (ref 4.0–10.5)

## 2012-01-20 LAB — DIFFERENTIAL
Basophils Absolute: 0 10*3/uL (ref 0.0–0.1)
Basophils Relative: 0 % (ref 0–1)
Eosinophils Absolute: 0.1 10*3/uL (ref 0.0–0.7)
Eosinophils Relative: 1 % (ref 0–5)
Lymphocytes Relative: 20 % (ref 12–46)
Lymphs Abs: 1.7 10*3/uL (ref 0.7–4.0)
Monocytes Absolute: 1.1 10*3/uL — ABNORMAL HIGH (ref 0.1–1.0)
Monocytes Relative: 13 % — ABNORMAL HIGH (ref 3–12)
Neutro Abs: 5.5 10*3/uL (ref 1.7–7.7)
Neutrophils Relative %: 66 % (ref 43–77)

## 2012-01-20 LAB — LACTIC ACID, PLASMA: Lactic Acid, Venous: 1 mmol/L (ref 0.5–2.2)

## 2012-01-20 LAB — COMPREHENSIVE METABOLIC PANEL
ALT: 23 U/L (ref 0–53)
AST: 24 U/L (ref 0–37)
Albumin: 3.6 g/dL (ref 3.5–5.2)
Alkaline Phosphatase: 85 U/L (ref 39–117)
BUN: 9 mg/dL (ref 6–23)
CO2: 22 mEq/L (ref 19–32)
Calcium: 9.7 mg/dL (ref 8.4–10.5)
Chloride: 103 mEq/L (ref 96–112)
Creatinine, Ser: 0.77 mg/dL (ref 0.50–1.35)
GFR calc Af Amer: >90 mL/min (ref >90)
GFR calc non Af Amer: >90 mL/min (ref >90)
Glucose, Bld: 186 mg/dL — ABNORMAL HIGH (ref 70–99)
Potassium: 3.6 mEq/L (ref 3.5–5.1)
Sodium: 138 mEq/L (ref 135–145)
Total Bilirubin: 0.3 mg/dL (ref 0.3–1.2)
Total Protein: 7.4 g/dL (ref 6.0–8.3)

## 2012-01-20 LAB — MAGNESIUM: Magnesium: 2 mg/dL (ref 1.5–2.5)

## 2012-01-20 LAB — HEMOGLOBIN A1C
Hgb A1c MFr Bld: 10.3 % — ABNORMAL HIGH (ref <5.7)
Mean Plasma Glucose: 249 mg/dL — ABNORMAL HIGH (ref <117)

## 2012-01-20 LAB — PHOSPHORUS: Phosphorus: 2.7 mg/dL (ref 2.3–4.6)

## 2012-01-20 LAB — LIPASE, BLOOD: Lipase: 51 U/L (ref 11–59)

## 2012-01-20 MED ORDER — ONDANSETRON HCL 4 MG/2ML IJ SOLN
4.0000 mg | Freq: Once | INTRAMUSCULAR | Status: AC
  Administered 2012-01-20: 4 mg via INTRAVENOUS
  Filled 2012-01-20: qty 2

## 2012-01-20 MED ORDER — METHYLPREDNISOLONE SODIUM SUCC 125 MG IJ SOLR
125.0000 mg | Freq: Once | INTRAMUSCULAR | Status: AC
  Administered 2012-01-20: 125 mg via INTRAVENOUS
  Filled 2012-01-20: qty 2

## 2012-01-20 MED ORDER — ONDANSETRON HCL 4 MG/2ML IJ SOLN: 4.0000 mg | Freq: Four times a day (QID) | INTRAMUSCULAR | Status: DC | PRN

## 2012-01-20 MED ORDER — ENOXAPARIN SODIUM 40 MG/0.4ML ~~LOC~~ SOLN
40.0000 mg | SUBCUTANEOUS | Status: DC
  Administered 2012-01-20 – 2012-01-23 (×4): 40 mg via SUBCUTANEOUS
  Filled 2012-01-20 (×6): qty 0.4

## 2012-01-20 MED ORDER — ONDANSETRON HCL 4 MG PO TABS: 4.0000 mg | ORAL_TABLET | Freq: Four times a day (QID) | ORAL | Status: DC | PRN

## 2012-01-20 MED ORDER — MORPHINE SULFATE 4 MG/ML IJ SOLN
4.0000 mg | Freq: Once | INTRAMUSCULAR | Status: AC
  Administered 2012-01-20: 4 mg via INTRAVENOUS
  Filled 2012-01-20: qty 1

## 2012-01-20 MED ORDER — HYDROMORPHONE HCL PF 1 MG/ML IJ SOLN
1.0000 mg | INTRAMUSCULAR | Status: DC | PRN
  Administered 2012-01-20 – 2012-01-24 (×7): 1 mg via INTRAVENOUS
  Filled 2012-01-20 (×8): qty 1

## 2012-01-20 MED ORDER — HYDROMORPHONE HCL PF 1 MG/ML IJ SOLN
INTRAMUSCULAR | Status: AC
  Filled 2012-01-20: qty 1

## 2012-01-20 MED ORDER — IOHEXOL 300 MG/ML  SOLN
80.0000 mL | Freq: Once | INTRAMUSCULAR | Status: AC | PRN
  Administered 2012-01-20: 80 mL via INTRAVENOUS

## 2012-01-20 MED ORDER — IOHEXOL 300 MG/ML  SOLN: 20.0000 mL | INTRAMUSCULAR | Status: AC

## 2012-01-20 MED ORDER — SODIUM CHLORIDE 0.9 % IV SOLN
INTRAVENOUS | Status: DC
  Administered 2012-01-20 – 2012-01-23 (×7): via INTRAVENOUS
  Administered 2012-01-23: 1000 mL via INTRAVENOUS
  Administered 2012-01-24 – 2012-01-25 (×4): via INTRAVENOUS

## 2012-01-20 MED ORDER — METHYLPREDNISOLONE SODIUM SUCC 40 MG IJ SOLR
40.0000 mg | Freq: Two times a day (BID) | INTRAMUSCULAR | Status: DC
  Administered 2012-01-20 – 2012-01-23 (×6): 40 mg via INTRAVENOUS
  Filled 2012-01-20 (×8): qty 1

## 2012-01-20 MED ORDER — INSULIN ASPART 100 UNIT/ML ~~LOC~~ SOLN
0.0000 [IU] | Freq: Three times a day (TID) | SUBCUTANEOUS | Status: DC
  Administered 2012-01-20 – 2012-01-21 (×3): 2 [IU] via SUBCUTANEOUS
  Filled 2012-01-20: qty 3

## 2012-01-20 NOTE — ED Provider Notes (Signed)
Ct Abdomen Pelvis W Contrast  01/20/2012  *RADIOLOGY REPORT*  Clinical Data: 46 year old male with right lower quadrant pain, nausea, vomiting.  History of Crohn disease.  CT ABDOMEN AND PELVIS WITH CONTRAST  Technique:  Multidetector CT imaging of the abdomen and pelvis was performed following the standard protocol during bolus administration of intravenous contrast.  Contrast: 20m OMNIPAQUE IOHEXOL 300 MG/ML IJ SOLN  Comparison: 09/06/2010.  Findings: Linear atelectasis in the left lower lobe.  No pericardial or pleural effusion.  Degenerative changes at the costovertebral junctions. No acute osseous abnormality identified.  Small volume of pelvic free fluid, decreased compared the prior. Rectum and sigmoid within normal limits.  Unremarkable bladder. Stable small pelvic phleboliths.  The colon appears normal to the level of the neo-terminal ileum. The TIA is abnormally thickened and patulous.  Wall thickening up to 7 mm (previously 9 mm).  Mild stenotic lesion in the proximal TI re-identified (series 2 image 75) with upstream flocculated material and mildly dilated small bowel loops up to the 42 mm diameter. There might be a second tandem stenosis in the distal small bowel on image 69, versus kinked appearance.  Trace interloop fluid (image 52).  Further upstream small bowel loops are dilated with some air-fluid levels.  Oral contrast has not reached the segment.  There is a low tapering of the more proximal small bowel.  No confluent mesenteric inflammation.  The most proximal jejunum is unremarkable.  The stomach and duodenum are not dilated.   Negative liver, gallbladder, spleen and adrenal glands.  Stable prominent main pancreatic duct, otherwise negative pancreas.  Portal venous system and major arterial structures are within normal limits for age.  Stable kidneys. Small retroperitoneal and mesenteric lymph nodes are stable. No free air.  IMPRESSION: 1.  Sequelae of chronic inflammatory bowel disease.   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  Ileum wall thickening has decreased from prior. Small volume of mesenteric and pelvic free fluid. 2.  No new abnormality in the abdomen or pelvis. 3.  The left lower lobe atelectasis.  Original Report Authenticated By: HRandall An M.D.   The patient was seen by Dr. CVanessa Kickthis morning. Please see his note for additional information   the CT scan suggests partial small bowel obstruction due to terminal ileum inflammatory stenosis. The patient is still complaining of pain and bloating. I will consult medicine for admission. I ordered a dose of steroids. Patient will likely need GI consultation as well.  JKathalene Frames MD 01/20/12 [phone]

## 2012-01-20 NOTE — ED Notes (Signed)
Patient transported to CT 

## 2012-01-20 NOTE — ED Notes (Signed)
PT reported morphine does not help him pain; when asked what did; pt reported "diludid".

## 2012-01-20 NOTE — ED Notes (Signed)
PT. REPORTS ABDOMINAL PAIN -" CROHNS FLARE UP" ONSET YESTERDAY WITH DIARREA, DENIES NAUSEA OR VOMITTING .

## 2012-01-20 NOTE — ED Provider Notes (Signed)
History     CSN: [id number]  Arrival date & time 01/20/12  0423   First MD Initiated Contact with Patient 01/20/12 0430      Chief Complaint  Patient presents with  . Abdominal Pain    (Consider location/radiation/quality/duration/timing/severity/associated sxs/prior treatment) Patient is a 46 y.o. male presenting with abdominal pain. The history is provided by the patient.  Abdominal Pain The primary symptoms of the illness include abdominal pain and diarrhea. The primary symptoms of the illness do not include fever, shortness of breath, nausea or vomiting.  Symptoms associated with the illness do not include chills or constipation.   the patient is a 46 year old, male, with history of Crohn's disease, and diabetes mellitus who presents to emergency department, abdominal pain.  He had one episode of diarrhea today, without blood in his stool.  He denies nausea, vomiting, fevers, chills, rash.  He has not changed his medications for his diabetes.  He is not taking any medications for Crohn's disease.  Past Medical History  Diagnosis Date  . Small bowel obstruction   . Crohn disease   . Type 2 diabetes mellitus   . Hyponatremia     mild  . Diabetes mellitus   . Crohn's disease     History reviewed. No pertinent past surgical history.  No family history on file.  History  Substance Use Topics  . Smoking status: Current Everyday Smoker  . Smokeless tobacco: Not on file  . Alcohol Use: Yes      Review of Systems  Constitutional: Negative for fever and chills.  Respiratory: Negative for cough and shortness of breath.   Gastrointestinal: Positive for abdominal pain and diarrhea. Negative for nausea, vomiting and constipation.  Skin: Negative for rash.  Neurological: Negative for weakness and headaches.  Psychiatric/Behavioral: Negative for confusion.  All other systems reviewed and are negative.    Allergies  Review of patient's allergies indicates no known  allergies.  Home Medications   Current Outpatient Rx  Name Route Sig Dispense Refill  . GLYBURIDE 5 MG PO TABS Oral Take 5 mg by mouth daily with breakfast.    . METFORMIN HCL 500 MG PO TABS Oral Take 500 mg by mouth 2 (two) times daily with a meal.      BP 136/76  Pulse 87  Temp(Src) 98 F (36.7 C) (Oral)  Resp 20  SpO2 99%  Physical Exam  Vitals reviewed. Constitutional: He is oriented to person, place, and time. He appears well-developed and well-nourished. No distress.  HENT:  Head: Normocephalic and atraumatic.  Eyes: EOM are normal. Pupils are equal, round, and reactive to light.  Neck: Normal range of motion. Neck supple.  Cardiovascular: Normal rate, regular rhythm and normal heart sounds.   No murmur heard. Pulmonary/Chest: Effort normal and breath sounds normal. No respiratory distress. He has no wheezes. He has no rales.  Abdominal: Soft. Bowel sounds are normal. He exhibits no distension and no mass. There is tenderness. There is guarding. There is no rebound.       Diffuse tenderness, mostly on the right side, with guarding.  Musculoskeletal: Normal range of motion. He exhibits no edema and no tenderness.  Neurological: He is alert and oriented to person, place, and time. No cranial nerve deficit.  Skin: Skin is warm and dry. He is not diaphoretic.  Psychiatric: He has a normal mood and affect. His behavior is normal.    ED Course  Procedures (including critical care time) 46 year old, male, with a history of  Crohn's disease, diabetes.  Presents to emergency are red with abdominal pain.  He has diffuse tenderness predominantly on the right side.  Lacunar right upper quadrant and right lower quadrant, associated with guarding.  We will perform laboratory testing.  Establish an IV and treat his symptoms and perform a CAT scan and is well Labs Reviewed  GLUCOSE, CAPILLARY - Abnormal; Notable for the following:    Glucose-Capillary 177 (*)    All other components  within normal limits  CBC  DIFFERENTIAL  COMPREHENSIVE METABOLIC PANEL  LIPASE, BLOOD   No results found.   No diagnosis found.    MDM  Abdominal pain        Elmer Picker, MD 01/21/12 [phone]

## 2012-01-20 NOTE — ED Notes (Signed)
Pt returned for CT; requesting pain control. MD to be made aware.

## 2012-01-20 NOTE — ED Notes (Signed)
CT called to let them know that pt has finished contrast and is ready to go home.

## 2012-01-20 NOTE — Consult Note (Signed)
[facility] Consult Note  Referring Provider: No ref. provider found Primary Care Physician:  Blanchie Serve., MD, MD Primary Gastroenterologist:  Dr.  Laurel Dimmer Complaint: Abdominal pain HPI: Jared [name] is an 46 y.o. black male.  Admitted with acute onset of abdominal pain bloating and distention with nausea but no vomiting. On CT scan he was found to have evidence of Crohn's disease of the terminal ileum with thickening and proximal distention. He has had similar bouts in the past similar presentation and disease location most recently in August 2012. He is been treated for short term with IV corticosteroids and has been on Pentasa at times. However his course has been hampered by lack of outpatient followup. It is not clear to me how long as it has been but he has not seen me in the office in several years. Apparently after his September admission when he was seen by Dr. Penelope Coop here he was given followup with me in did not keep it. He denies any diarrhea or vomiting.  Past Medical History  Diagnosis Date  . Crohn disease 2008    with hospital admission in 2011, and 8/12 for flare up and SBO relieved with bowel rest. no suregery, no meds for CD  . Diabetes mellitus type II     Past Surgical History  Procedure Date  . 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 Scrotal surgery ~ 1971    "took out a pellet"    Medications Prior to Admission  Medication Dose Route Frequency Provider Last Rate Last Dose  . 0.9 %  sodium chloride infusion   Intravenous Continuous Madhav Devani, MD      . enoxaparin (LOVENOX) injection 40 mg  40 mg Subcutaneous Q24H Madhav Devani, MD      . HYDROmorphone (DILAUDID) 1 MG/ML injection           . HYDROmorphone (DILAUDID) injection 1 mg  1 mg Intravenous Q4H PRN Lester Elko, MD   1 mg at 01/20/12 1338  . insulin aspart (novoLOG) injection 0-9 Units  0-9 Units Subcutaneous TID WC Madhav Devani, MD      . iohexol (OMNIPAQUE) 300 MG/ML  solution 20 mL  20 mL Oral Q1 Hr x 2 Medication Radiologist, MD      . iohexol (OMNIPAQUE) 300 MG/ML solution 80 mL  80 mL Intravenous Once PRN Medication Radiologist, MD   80 mL at 01/20/12 0714  . methylPREDNISolone sodium succinate (SOLU-MEDROL) 125 MG injection 125 mg  125 mg Intravenous Once Kathalene Frames, MD   125 mg at 01/20/12 0835  . methylPREDNISolone sodium succinate (SOLU-MEDROL) 40 MG injection 40 mg  40 mg Intravenous Q12H Madhav Devani, MD      . morphine 4 MG/ML injection 4 mg  4 mg Intravenous Once Elmer Picker, MD   4 mg at 01/20/12 0501  . morphine 4 MG/ML injection 4 mg  4 mg Intravenous Once Kathalene Frames, MD   4 mg at 01/20/12 1448  . ondansetron (ZOFRAN) injection 4 mg  4 mg Intravenous Once Elmer Picker, MD   4 mg at 01/20/12 0501  . ondansetron (ZOFRAN) injection 4 mg  4 mg Intravenous Once Kathalene Frames, MD   4 mg at 01/20/12 1856  . ondansetron (ZOFRAN) tablet 4 mg  4 mg Oral Q6H PRN Lester Campbellton, MD       Or  . ondansetron (ZOFRAN) injection 4 mg  4 mg Intravenous Q6H PRN Madhav Devani,  MD       No current outpatient prescriptions on file as of 01/20/2012.    Allergies: No Known Allergies  History reviewed. No pertinent family history.  Social History:  reports that he has been smoking Cigarettes.  He has a 7.5 pack-year smoking history. He quit smokeless tobacco use about 18 years ago. His smokeless tobacco use included Chew. He reports that he drinks alcohol. He reports that he does not use illicit drugs.  Negative except as above   Blood pressure 112/72, pulse 68, temperature 98 F (36.7 C), temperature source Oral, resp. rate 20, SpO2 95.00%. Head: Normocephalic, without obvious abnormality, atraumatic Neck: no adenopathy, no carotid bruit, no JVD, supple, symmetrical, trachea midline and thyroid not enlarged, symmetric, no tenderness/mass/nodules Resp: clear to auscultation bilaterally Cardio: regular rate and rhythm, S1, S2 normal, no murmur,  click, rub or gallop GI: Abdomen somewhat taut and firm with mild diffuse tenderness right lower quadrant greater than left. Extremities: extremities normal, atraumatic, no cyanosis or edema  Results for orders placed during the hospital encounter of 01/20/12 (from the past 48 hour(s))  GLUCOSE, CAPILLARY     Status: Abnormal   Collection Time   01/20/12  4:44 AM      Component Value Range Comment   Glucose-Capillary 177 (*) 70 - 99 (mg/dL)   CBC     Status: Normal   Collection Time   01/20/12  5:00 AM      Component Value Range Comment   WBC 8.4  4.0 - 10.5 (K/uL)    RBC 5.38  4.22 - 5.81 (MIL/uL)    Hemoglobin 15.9  13.0 - 17.0 (g/dL)    HCT 44.9  39.0 - 52.0 (%)    MCV 83.5  78.0 - 100.0 (fL)    MCH 29.6  26.0 - 34.0 (pg)    MCHC 35.4  30.0 - 36.0 (g/dL)    RDW 14.0  11.5 - 15.5 (%)    Platelets 266  150 - 400 (K/uL)   DIFFERENTIAL     Status: Abnormal   Collection Time   01/20/12  5:00 AM      Component Value Range Comment   Neutrophils Relative 66  43 - 77 (%)    Neutro Abs 5.5  1.7 - 7.7 (K/uL)    Lymphocytes Relative 20  12 - 46 (%)    Lymphs Abs 1.7  0.7 - 4.0 (K/uL)    Monocytes Relative 13 (*) 3 - 12 (%)    Monocytes Absolute 1.1 (*) 0.1 - 1.0 (K/uL)    Eosinophils Relative 1  0 - 5 (%)    Eosinophils Absolute 0.1  0.0 - 0.7 (K/uL)    Basophils Relative 0  0 - 1 (%)    Basophils Absolute 0.0  0.0 - 0.1 (K/uL)   COMPREHENSIVE METABOLIC PANEL     Status: Abnormal   Collection Time   01/20/12  5:00 AM      Component Value Range Comment   Sodium 138  135 - 145 (mEq/L)    Potassium 3.6  3.5 - 5.1 (mEq/L)    Chloride 103  96 - 112 (mEq/L)    CO2 22  19 - 32 (mEq/L)    Glucose, Bld 186 (*) 70 - 99 (mg/dL)    BUN 9  6 - 23 (mg/dL)    Creatinine, Ser 0.77  0.50 - 1.35 (mg/dL)    Calcium 9.7  8.4 - 10.5 (mg/dL)    Total Protein 7.4  6.0 -  8.3 (g/dL)    Albumin 3.6  3.5 - 5.2 (g/dL)    AST 24  0 - 37 (U/L)    ALT 23  0 - 53 (U/L)    Alkaline Phosphatase 85  39 - 117 (U/L)     Total Bilirubin 0.3  0.3 - 1.2 (mg/dL)    GFR calc non Af Amer >90  >90 (mL/min)    GFR calc Af Amer >90  >90 (mL/min)   LIPASE, BLOOD     Status: Normal   Collection Time   01/20/12  5:00 AM      Component Value Range Comment   Lipase 51  11 - 59 (U/L)   GLUCOSE, CAPILLARY     Status: Abnormal   Collection Time   01/20/12 10:04 AM      Component Value Range Comment   Glucose-Capillary 171 (*) 70 - 99 (mg/dL)   MAGNESIUM     Status: Normal   Collection Time   01/20/12 11:46 AM      Component Value Range Comment   Magnesium 2.0  1.5 - 2.5 (mg/dL)   PHOSPHORUS     Status: Normal   Collection Time   01/20/12 11:46 AM      Component Value Range Comment   Phosphorus 2.7  2.3 - 4.6 (mg/dL)   LACTIC ACID, PLASMA     Status: Normal   Collection Time   01/20/12 11:46 AM      Component Value Range Comment   Lactic Acid, Venous 1.0  0.5 - 2.2 (mmol/L)    Ct Abdomen Pelvis W Contrast  01/20/2012  *RADIOLOGY REPORT*  Clinical Data: 46 year old male with right lower quadrant pain, nausea, vomiting.  History of Crohn disease.  CT ABDOMEN AND PELVIS WITH CONTRAST  Technique:  Multidetector CT imaging of the abdomen and pelvis was performed following the standard protocol during bolus administration of intravenous contrast.  Contrast: 60m OMNIPAQUE IOHEXOL 300 MG/ML IJ SOLN  Comparison: 09/06/2010.  Findings: Linear atelectasis in the left lower lobe.  No pericardial or pleural effusion.  Degenerative changes at the costovertebral junctions. No acute osseous abnormality identified.  Small volume of pelvic free fluid, decreased compared the prior. Rectum and sigmoid within normal limits.  Unremarkable bladder. Stable small pelvic phleboliths.  The colon appears normal to the level of the neo-terminal ileum. The TIA is abnormally thickened and patulous.  Wall thickening up to 7 mm (previously 9 mm).  Mild stenotic lesion in the proximal TI re-identified (series 2 image 75) with upstream flocculated material and  mildly dilated small bowel loops up to the 42 mm diameter. There might be a second tandem stenosis in the distal small bowel on image 69, versus kinked appearance.  Trace interloop fluid (image 52).  Further upstream small bowel loops are dilated with some air-fluid levels.  Oral contrast has not reached the segment.  There is a low tapering of the more proximal small bowel.  No confluent mesenteric inflammation.  The most proximal jejunum is unremarkable.  The stomach and duodenum are not dilated.   Negative liver, gallbladder, spleen and adrenal glands.  Stable prominent main pancreatic duct, otherwise negative pancreas.  Portal venous system and major arterial structures are within normal limits for age.  Stable kidneys. Small retroperitoneal and mesenteric lymph nodes are stable. No free air.  IMPRESSION: 1.  Sequelae of chronic inflammatory bowel disease.  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  Ileum wall thickening has decreased from prior. Small  volume of mesenteric and pelvic free fluid. 2.  No new abnormality in the abdomen or pelvis. 3.  The left lower lobe atelectasis.  Original Report Authenticated By: Randall An, M.D.    Assessment: Active Crohn's disease with small bowel obstruction Plan:  Bowel rest IV corticosteroids and serial radiologic examination. Will transitioned him over to a 5-ASA product of small bowel obstruction improves Ercil Cassis C 01/20/2012, 2:46 PM

## 2012-01-20 NOTE — H&P (Signed)
Hospital Admission Note Date: 01/20/2012  Patient name:  Jared [name]   Medical record number:  [id number] Date of birth:  [birth year]  Age: 46 y.o. Gender:  male PCP:    Blanchie Serve., MD, MD  Medical Service:   Internal Medicine Teaching Service   Attending physician:  Dr. Orene Desanctis First Contact:   Dr. Nicoletta Dress      Pager: [phone] Second Contact:   Dr. Posey Pronto           Pager: [phone] After Hours:    First Contact   Pager: [phone]      Second Contact  Pager: [phone]   Chief Complaint: abdominal pain and distension  History of Present Illness: Patient is a 46 y.o. male with a PMHx of Crohn's disease and diabetes , who presents to [facility] for abdominal pain and distention since yesterday evening. Patient was fine until yesterday evening. After he had his dinner at about 6 PM he felt bloating and abdominal distention for a few hours. He also had one loose stool without any blood in it. At about 11 PM he started having sharp abdominal pain which was diffuse but more on the right side of his abdomen. He did not have any nausea or vomiting. He never noticed any blood in his stools. He did not eat or drink anything since the onset of pain. He had similar episodes before when he was diagnosed with Crohn's disease flareup and was treated with bowel rest and steroids in the hospital. His usual Crohn's disease flareup lasts for about a week. He does not take any maintenance medication for his Crohn's disease. He has never seen GI outpatient but has seen Dr. Amedeo Plenty in the hospital. He had a colonoscopy in the hospital last year which showed polyps.   Current Outpatient Medications: Metformin Gliburide  Allergies: No Known Allergies   Past Medical History: Past Medical History  Diagnosis Date  . Crohn disease 2008    with hospital admission in 2011, and 8/12 for flare up and SBO relieved with bowel rest. no suregery, no meds for CD  . Diabetes mellitus type II     Past Surgical History: Past Surgical  History  Procedure Date  . 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 Scrotal surgery ~ 1971    "took out a pellet"    Family History: History reviewed. No pertinent family history.  Social History: History   Social History  . Marital Status: Single    Spouse Name: N/A    Number of Children: N/A  . Years of Education: N/A   Occupational History  . Not on file.   Social History Main Topics  . Smoking status: Current Everyday Smoker -- 0.2 packs/day for 30 years    Types: Cigarettes  . Smokeless tobacco: Former Systems developer    Types: [place] date: 11/15/1993   Comment: consult entered (01/20/12)  . Alcohol Use: Yes     01/20/12 "a beer ~ q 3-4 months"  . Drug Use: No  . Sexually Active: Not Currently   Other Topics Concern  . Not on file   Social History Narrative   Lives in [place].Is single. Has a sister in [place]. Smokes 2-3 cigarettes a day. Drinks beer once every few months. No history of drug abuse. Studied up to 12th grade. Has orange card and follows [facility] . No health insurance. 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  Review of Systems: Constitutional:  Denies fever, chills, diaphoresis, appetite change and fatigue.  HEENT: Denies photophobia, eye pain, redness, hearing loss, ear pain, congestion, sore throat, rhinorrhea, sneezing, mouth sores, trouble swallowing, neck pain, neck stiffness and tinnitus.  Respiratory: Denies SOB, DOE, cough, chest tightness, and wheezing.  Cardiovascular: Denies chest pain, palpitations and leg swelling.  Gastrointestinal:  as per history of present illness   Genitourinary: Denies dysuria, urgency, frequency, hematuria, flank pain and difficulty urinating.  Musculoskeletal: Denies myalgias, back pain, joint swelling, arthralgias and gait problem.   Skin: Denies pallor, rash and wound.  Neurological: Denies dizziness, seizures, syncope, weakness, light-headedness, numbness and  headaches.   Hematological: Denies adenopathy. Easy bruising, personal or family bleeding history.  Psychiatric/ Behavioral: Denies suicidal ideation, mood changes, confusion, nervousness, sleep disturbance and agitation.   Filed Vitals:   01/20/12 0905  BP: 112/72  Pulse: 68  Temp:  afebrile   Resp: 20    Physical Exam: General: Vital signs reviewed and noted. Well-developed, well-nourished, in no acute distress; alert, appropriate and cooperative throughout examination.  Head: Normocephalic, atraumatic.  Eyes: PERRL, EOMI, No signs of anemia or jaundince.  Nose: Mucous membranes moist, not inflammed, nonerythematous.  Throat: Oropharynx nonerythematous, no exudate appreciated.   Neck: No deformities, masses, or tenderness noted.Supple, No carotid Bruits, no JVD.  Lungs:  Normal respiratory effort. Clear to auscultation BL without crackles or wheezes.  Heart: RRR. S1 and S2 normal without gallop, murmur, or rubs.  Abdomen:   distended with soreness on deep palpation diffusely, hyperactive bowel sounds on the right , no rigidity or guarding  Extremities: No pretibial edema.  Neurologic: A&O X3, CN II - XII are grossly intact. Motor strength is 5/5 in the all 4 extremities, Sensations intact to light touch, Cerebellar signs negative.  Skin: No visible rashes, scars.   Lab results: Basic Metabolic Panel: Recent Labs  Basename 01/20/12 1146 01/20/12 0500   NA -- 138   K -- 3.6   CL -- 103   CO2 -- 22   GLUCOSE -- 186*   BUN -- 9   CREATININE -- 0.77   CALCIUM -- 9.7   MG 2.0 --   PHOS 2.7 --   Liver Function Tests: Recent Labs  Basename 01/20/12 0500   AST 24   ALT 23   ALKPHOS 85   BILITOT 0.3   PROT 7.4   ALBUMIN 3.6   Recent Labs  Basename 01/20/12 0500   LIPASE 51   AMYLASE --   CBC: Recent Labs  Basename 01/20/12 0500   WBC 8.4   NEUTROABS 5.5   HGB 15.9   HCT 44.9   MCV 83.5   PLT 266   CBG: Recent Labs  Basename 01/20/12 1004 01/20/12 0444    GLUCAP 171* 177*     Imaging results:  Ct Abdomen Pelvis W Contrast  01/20/2012  *RADIOLOGY REPORT*  Clinical Data: 46 year old male with right lower quadrant pain, nausea, vomiting.  History of Crohn disease.  CT ABDOMEN AND PELVIS WITH CONTRAST  Technique:  Multidetector CT imaging of the abdomen and pelvis was performed following the standard protocol during bolus administration of intravenous contrast.  Contrast: 40m OMNIPAQUE IOHEXOL 300 MG/ML IJ SOLN  Comparison: 09/06/2010.  Findings: Linear atelectasis in the left lower lobe.  No pericardial or pleural effusion.  Degenerative changes at the costovertebral junctions. No acute osseous abnormality identified.  Small volume of pelvic free fluid, decreased compared the prior. Rectum and sigmoid within normal limits.  Unremarkable bladder. Stable small pelvic phleboliths.  The colon appears normal to the level of the neo-terminal ileum. The TIA is abnormally thickened and patulous.  Wall thickening up to 7 mm (previously 9 mm).  Mild stenotic lesion in the proximal TI re-identified (series 2 image 75) with upstream flocculated material and mildly dilated small bowel loops up to the 42 mm diameter. There might be a second tandem stenosis in the distal small bowel on image 69, versus kinked appearance.  Trace interloop fluid (image 52).  Further upstream small bowel loops are dilated with some air-fluid levels.  Oral contrast has not reached the segment.  There is a low tapering of the more proximal small bowel.  No confluent mesenteric inflammation.  The most proximal jejunum is unremarkable.  The stomach and duodenum are not dilated.   Negative liver, gallbladder, spleen and adrenal glands.  Stable prominent main pancreatic duct, otherwise negative pancreas.  Portal venous system and major arterial structures are within normal limits for age.  Stable kidneys. Small retroperitoneal and mesenteric lymph nodes are stable. No free air.  IMPRESSION: 1.  Sequelae  of chronic inflammatory bowel disease.  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  Ileum wall thickening has decreased from prior. Small volume of mesenteric and pelvic free fluid. 2.  No new abnormality in the abdomen or pelvis. 3.  The left lower lobe atelectasis.  Original Report Authenticated By: Randall An, M.D.    Assessment & Plan: 46 year old man with past medical history of Crohn's disease for past 5 years and diabetes comes to the emergency department with what seems to be his usual Crohn's disease flareup and small bowel obstruction as a complication.  #1 Crohn's disease flareup and SBO - Patient is currently not nauseous or vomiting. He is passing gas - His pain is controlled with narcotic pain medication and he is hemodynamically stable  Plan - N.p.o. with ice chips - IV fluid resuscitation with normal saline - IV Solu-Medrol 40 mg twice a day - Place NG tube if patient starts vomiting - Dilaudid for pain control if needed - Zofran for nausea and vomiting - Repeat abdominal x-ray in the morning to assess for obstruction - GI consult for advice on chronic maintenance medication. He was on Pentasa before but seems that it wasn't working. He last took her 2 years ago.  #2 diabetes - Patient has mild anion gap acidosis. Check lactate. Repeat chemistry in the morning - Check A1c - Correction insulin - Restart metformin and gliburide at discharge  #3 DVT PPX - Lovenox     Lester [place], M.D. (PGY3):  ____________________________________    Date/ Time:     ____________________________________

## 2012-01-20 NOTE — Progress Notes (Signed)
UR completed. Jared [name] [place] 01/20/2012 [phone]

## 2012-01-20 NOTE — ED Notes (Signed)
Admitting at bedside 

## 2012-01-20 NOTE — ED Notes (Signed)
EDP at bedside  

## 2012-01-20 NOTE — ED Notes (Addendum)
Pt dx with Cron's in 2008 at urgent care; no GI MD; has had hx of 5 flare ups. Was hospitalized in August for it but this incident is reportedly not as bad as that time; diffuse abd cramping 8/10. Pt takes metformin only. Pt reports his CBG's are not well controlled and metformin not controlling them well; checks CBG them when he "feels like sugars are up". Reports he tried to get insulin through [facility] but was not able.

## 2012-01-20 NOTE — ED Notes (Signed)
Patient is resting comfortably watching TV; no signs of distress; first cup of contrast completed.

## 2012-01-21 ENCOUNTER — Inpatient Hospital Stay (HOSPITAL_COMMUNITY): Payer: Self-pay

## 2012-01-21 LAB — URINALYSIS, ROUTINE W REFLEX MICROSCOPIC
Bilirubin Urine: NEGATIVE
Glucose, UA: 500 mg/dL — AB
Hgb urine dipstick: NEGATIVE
Ketones, ur: 15 mg/dL — AB
Leukocytes, UA: NEGATIVE
Nitrite: NEGATIVE
Protein, ur: NEGATIVE mg/dL
Specific Gravity, Urine: 1.011 (ref 1.005–1.030)
Urobilinogen, UA: 0.2 mg/dL (ref 0.0–1.0)
pH: 6 (ref 5.0–8.0)

## 2012-01-21 LAB — CBC
HCT: 44.3 % (ref 39.0–52.0)
Hemoglobin: 15.5 g/dL (ref 13.0–17.0)
MCH: 29.1 pg (ref 26.0–34.0)
MCHC: 35 g/dL (ref 30.0–36.0)
MCV: 83.3 fL (ref 78.0–100.0)
Platelets: 276 10*3/uL (ref 150–400)
RBC: 5.32 MIL/uL (ref 4.22–5.81)
RDW: 13.8 % (ref 11.5–15.5)
WBC: 10.7 10*3/uL — ABNORMAL HIGH (ref 4.0–10.5)

## 2012-01-21 LAB — GLUCOSE, CAPILLARY
Glucose-Capillary: 184 mg/dL — ABNORMAL HIGH (ref 70–99)
Glucose-Capillary: 174 mg/dL — ABNORMAL HIGH (ref 70–99)
Glucose-Capillary: 204 mg/dL — ABNORMAL HIGH (ref 70–99)
Glucose-Capillary: 212 mg/dL — ABNORMAL HIGH (ref 70–99)

## 2012-01-21 LAB — BASIC METABOLIC PANEL
BUN: 9 mg/dL (ref 6–23)
CO2: 24 mEq/L (ref 19–32)
Calcium: 8.7 mg/dL (ref 8.4–10.5)
Chloride: 101 mEq/L (ref 96–112)
Creatinine, Ser: 0.73 mg/dL (ref 0.50–1.35)
GFR calc Af Amer: >90 mL/min (ref >90)
GFR calc non Af Amer: >90 mL/min (ref >90)
Glucose, Bld: 186 mg/dL — ABNORMAL HIGH (ref 70–99)
Potassium: 3.8 mEq/L (ref 3.5–5.1)
Sodium: 136 mEq/L (ref 135–145)

## 2012-01-21 MED ORDER — INSULIN ASPART 100 UNIT/ML ~~LOC~~ SOLN
0.0000 [IU] | Freq: Three times a day (TID) | SUBCUTANEOUS | Status: DC
  Administered 2012-01-21: 5 [IU] via SUBCUTANEOUS
  Administered 2012-01-22: 3 [IU] via SUBCUTANEOUS
  Administered 2012-01-22: 2 [IU] via SUBCUTANEOUS
  Administered 2012-01-22 – 2012-01-23 (×2): 3 [IU] via SUBCUTANEOUS
  Administered 2012-01-23: 7 [IU] via SUBCUTANEOUS
  Filled 2012-01-21: qty 3

## 2012-01-21 NOTE — Progress Notes (Signed)
Medical Student Daily Progress Note  Subjective: Pt is a 46 yo male with a Hx of Crohn disease and diabetes, who presents with 1 day hx of abdominal pain and distention. CT is consistent with acute Crohn flare up. Pt had 7x flare ups in the past. Problem with med adherence due to socioeconomic reasons. 24 hr events:  - seen by GI consult, advanced to clears diet This morning:   - feeling "alright"  - pain is moderately ctrled with dilaudid (pain 5 -> 9 2hr after getting dilaudid)  - passing gas, but no stool yet.  - bloating is "better".  - ambulating  - V/S.  - no fever, wt loss, sweating, eye pain, vision probs, anal pain, blood in stool, mouth ulcers, diarrhea, erythema nodosum, arthritis, sob, chest pain.   Objective: Vital signs in last 24 hours: Filed Vitals:   01/20/12 0830 01/20/12 0905 01/20/12 2136 01/21/12 0502  BP: 120/68 112/72 114/70 125/75  Pulse: 71 68 76 78  Temp:   98 F (36.7 C) 97.7 F (36.5 C)  TempSrc:   Oral   Resp:  20 18 18   SpO2: 97% 95% 95% 96%   Weight change:   Intake/Output Summary (Last 24 hours) at 01/21/12 1334 Last data filed at 01/21/12 0700  Gross per 24 hour  Intake   2100 ml  Output      0 ml  Net   2100 ml   Physical Exam:  Vitals reviewed. General: resting in bed, eating popsicles, (standing up watching TV later), NAD Cardiac: RRR, no rubs, murmurs or gallops Pulm: clear to auscultation bilaterally, no wheezes, rales, or rhonchi Abd: BS hyperactive, distended, nontender, no rebound tenderness, percussion tympanic x4 quadrants. Ext: warm and well perfused, no pedal edema, no tenderness.  Lab Results: Basic Metabolic Panel:  Lab 88/82/80 0510 01/20/12 1146 01/20/12 0500  NA 136 -- 138  K 3.8 -- 3.6  CL 101 -- 103  CO2 24 -- 22  GLUCOSE 186* -- 186*  BUN 9 -- 9  CREATININE 0.73 -- 0.77  CALCIUM 8.7 -- 9.7  MG -- 2.0 --  PHOS -- 2.7 --   Liver Function Tests:  Lab 01/20/12 0500  AST 24  ALT 23  ALKPHOS 85  BILITOT  0.3  PROT 7.4  ALBUMIN 3.6    Lab 01/20/12 0500  LIPASE 51  AMYLASE --   No results found for this basename: AMMONIA:2 in the last 168 hours CBC:  Lab 01/21/12 0510 01/20/12 0500  WBC 10.7* 8.4  NEUTROABS -- 5.5  HGB 15.5 15.9  HCT 44.3 44.9  MCV 83.3 83.5  PLT 276 266    CBG:  Lab 01/21/12 1057 01/21/12 0617 01/20/12 2137 01/20/12 1631 01/20/12 1004 01/20/12 0444  GLUCAP 174* 184* 188* 190* 171* 177*   Hemoglobin A1C:  Lab 01/20/12 1312  HGBA1C 10.3*    Studies/Results: Abd 1 View (kub)  01/21/2012  *RADIOLOGY REPORT*  Clinical Data: Crohn's disease.  Abdominal pain.  ABDOMEN - 1 VIEW  Comparison: CT abdomen pelvis 01/20/2012.  Findings: Contrast material from the patient's CT scan is now in the ascending colon.  There is persistent dilatation of small bowel with loops measuring up to 5.5 cm.  IMPRESSION: Findings compatible with persistent partial small bowel obstruction.  Original Report Authenticated By: Arvid Right. Luther Parody, M.D.   Ct Abdomen Pelvis W Contrast  01/20/2012  *RADIOLOGY REPORT*  Clinical Data: 46 year old male with right lower quadrant pain, nausea, vomiting.  History of Crohn disease.  CT ABDOMEN AND PELVIS WITH CONTRAST  Technique:  Multidetector CT imaging of the abdomen and pelvis was performed following the standard protocol during bolus administration of intravenous contrast.  Contrast: 87m OMNIPAQUE IOHEXOL 300 MG/ML IJ SOLN  Comparison: 09/06/2010.  Findings: Linear atelectasis in the left lower lobe.  No pericardial or pleural effusion.  Degenerative changes at the costovertebral junctions. No acute osseous abnormality identified.  Small volume of pelvic free fluid, decreased compared the prior. Rectum and sigmoid within normal limits.  Unremarkable bladder. Stable small pelvic phleboliths.  The colon appears normal to the level of the neo-terminal ileum. The TIA is abnormally thickened and patulous.  Wall thickening up to 7 mm (previously 9 mm).  Mild  stenotic lesion in the proximal TI re-identified (series 2 image 75) with upstream flocculated material and mildly dilated small bowel loops up to the 42 mm diameter. There might be a second tandem stenosis in the distal small bowel on image 69, versus kinked appearance.  Trace interloop fluid (image 52).  Further upstream small bowel loops are dilated with some air-fluid levels.  Oral contrast has not reached the segment.  There is a low tapering of the more proximal small bowel.  No confluent mesenteric inflammation.  The most proximal jejunum is unremarkable.  The stomach and duodenum are not dilated.   Negative liver, gallbladder, spleen and adrenal glands.  Stable prominent main pancreatic duct, otherwise negative pancreas.  Portal venous system and major arterial structures are within normal limits for age.  Stable kidneys. Small retroperitoneal and mesenteric lymph nodes are stable. No free air.  IMPRESSION: 1.  Sequelae of chronic inflammatory bowel disease.  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  Ileum wall thickening has decreased from prior. Small volume of mesenteric and pelvic free fluid. 2.  No new abnormality in the abdomen or pelvis. 3.  The left lower lobe atelectasis.  Original Report Authenticated By: HRandall An M.D.   Medications: I have reviewed the patient's current medications. Scheduled Meds:   . enoxaparin  40 mg Subcutaneous Q24H  . HYDROmorphone      . insulin aspart  0-9 Units Subcutaneous TID WC  . methylPREDNISolone (SOLU-MEDROL) injection  40 mg Intravenous Q12H   Continuous Infusions:   . sodium chloride 150 mL/hr at 01/21/12 0934   PRN Meds:.HYDROmorphone, ondansetron (ZOFRAN) IV, ondansetron Assessment/Plan:  46year old man with past medical history of Crohn's disease for past 5 years and diabetes comes to the emergency department with what seems to be his usual Crohn's disease flareup and small bowel obstruction  as a complication.  #1 Crohn's disease flareup and SBO  - Patient is currently not nauseous or vomiting. He is passing gas. No stool yet. - His pain is moderately controlled with narcotic pain medication and he is hemodynamically stable. - WBC 8.4->10.7. Most likely due to steroids. Pt afebrile, no rebound tenderness.   - advanced to clears diet today. Monitor tomorrow for possible advancement if obstr improves.  - no change to pain med today. Spoke with pt, pain is acceptable on current regimen.  - monitor for passage of stool   - cont steroid for at least another day.  - KUB this am shows partial obstruction. Repeat KUB tomorrow.  - Per GI consult: could need surgical consult repeat CT scan at some point etc.  - f/u C diff test  - consider social work involvement due to socioeconomic factors in med adherence.  - consider counselling for smoking  cessation.  - f/u w GI consult recs tomorrow.   - IV fluid resuscitation with normal saline   - IV Solu-Medrol 40 mg twice a day   - Place NG tube if patient starts vomiting   - Dilaudid for pain control if needed   - Zofran for nausea and vomiting   - Repeat abdominal x-ray in the morning to assess for obstruction   - GI consult for advice on chronic maintenance medication. He was on Pentasa before but seems that it wasn't working. He last took her 2 years ago.   #2 diabetes  - CBG 184 - Patient has mild anion gap acidosis.   - lactate nl at 1.  - f/u w repeat lactate lvl. - A1c 10.3, was 8.6 a year ago. - Correction insulin  - Restart metformin and gliburide at discharge   #3 DVT PPX - Lovenox     LOS: 1 day   This is a Careers information officer Note.  The care of the patient was discussed with Dr. Nicoletta Dress and the assessment and plan formulated with their assistance.  Please see their attached note for official documentation of the daily encounter.  Anola Gurney 01/21/2012, 1:34 PM

## 2012-01-21 NOTE — Progress Notes (Signed)
01/21/12  HgbA1C is 10.3%.  Will need follow up with PCP on reevaluating diabetes medications at discharge.  Will continue to follow while in hospital.  Harvel Ricks RN BSN

## 2012-01-21 NOTE — Progress Notes (Signed)
Attending note   Jared [name] has flare of his Crohn disease very similar to previous admissions and had already been seen by Dr. Amedeo Plenty ,a gastroenterologist. I have reviewed his care and finding with [facility] Nicoletta Dress and housestaff team. He has moderate illeus and agree with bowel rest and parenteral steroids. His previous lack of health insurance has suppressed his seeing care but now that he has Medicaid he can access chronic suppressive medication.    His exam shows very quiet bowel sounds and focal tenderness but nothing to suggest perforation and CT of abdomen confirms. Lars Mage

## 2012-01-21 NOTE — Progress Notes (Signed)
[facility] Progress Note  Subjective: Still having lower abdominal pain and tenderness relieved by morphine abdomen feels subjective loose slightly less distended  Objective: Vital signs in last 24 hours: Temp:  [97.7 F (36.5 C)-98 F (36.7 C)] 97.7 F (36.5 C) (03/08 0502) Pulse Rate:  [76-78] 78  (03/08 0502) Resp:  [18] 18  (03/08 0502) BP: (114-125)/(70-75) 125/75 mmHg (03/08 0502) SpO2:  [95 %-96 %] 96 % (03/08 0502) Weight change:    PE: Abdomen still somewhat firm with mild to moderate lower abdominal tenderness worse on the right  Lab Results: Results for orders placed during the hospital encounter of 01/20/12 (from the past 24 hour(s))  HEMOGLOBIN A1C     Status: Abnormal   Collection Time   01/20/12  1:12 PM      Component Value Range   Hemoglobin A1C 10.3 (*) <5.7 (%)   Mean Plasma Glucose 249 (*) <117 (mg/dL)  GLUCOSE, CAPILLARY     Status: Abnormal   Collection Time   01/20/12  4:31 PM      Component Value Range   Glucose-Capillary 190 (*) 70 - 99 (mg/dL)   Comment 1 Notify RN    GLUCOSE, CAPILLARY     Status: Abnormal   Collection Time   01/20/12  9:37 PM      Component Value Range   Glucose-Capillary 188 (*) 70 - 99 (mg/dL)  BASIC METABOLIC PANEL     Status: Abnormal   Collection Time   01/21/12  5:10 AM      Component Value Range   Sodium 136  135 - 145 (mEq/L)   Potassium 3.8  3.5 - 5.1 (mEq/L)   Chloride 101  96 - 112 (mEq/L)   CO2 24  19 - 32 (mEq/L)   Glucose, Bld 186 (*) 70 - 99 (mg/dL)   BUN 9  6 - 23 (mg/dL)   Creatinine, Ser 0.73  0.50 - 1.35 (mg/dL)   Calcium 8.7  8.4 - 10.5 (mg/dL)   GFR calc non Af Amer >90  >90 (mL/min)   GFR calc Af Amer >90  >90 (mL/min)  CBC     Status: Abnormal   Collection Time   01/21/12  5:10 AM      Component Value Range   WBC 10.7 (*) 4.0 - 10.5 (K/uL)   RBC 5.32  4.22 - 5.81 (MIL/uL)   Hemoglobin 15.5  13.0 - 17.0 (g/dL)   HCT 44.3  39.0 - 52.0 (%)   MCV 83.3  78.0 - 100.0 (fL)   MCH 29.1  26.0 -  34.0 (pg)   MCHC 35.0  30.0 - 36.0 (g/dL)   RDW 13.8  11.5 - 15.5 (%)   Platelets 276  150 - 400 (K/uL)  GLUCOSE, CAPILLARY     Status: Abnormal   Collection Time   01/21/12  6:17 AM      Component Value Range   Glucose-Capillary 184 (*) 70 - 99 (mg/dL)  GLUCOSE, CAPILLARY     Status: Abnormal   Collection Time   01/21/12 10:57 AM      Component Value Range   Glucose-Capillary 174 (*) 70 - 99 (mg/dL)    Studies/Results: Abd 1 View (kub)  01/21/2012  *RADIOLOGY REPORT*  Clinical Data: Crohn's disease.  Abdominal pain.  ABDOMEN - 1 VIEW  Comparison: CT abdomen pelvis 01/20/2012.  Findings: Contrast material from the patient's CT scan is now in the ascending colon.  There is persistent dilatation of small bowel with loops measuring up  to 5.5 cm.  IMPRESSION: Findings compatible with persistent partial small bowel obstruction.  Original Report Authenticated By: Arvid Right. Luther Parody, M.D.   Ct Abdomen Pelvis W Contrast  01/20/2012  *RADIOLOGY REPORT*  Clinical Data: 46 year old male with right lower quadrant pain, nausea, vomiting.  History of Crohn disease.  CT ABDOMEN AND PELVIS WITH CONTRAST  Technique:  Multidetector CT imaging of the abdomen and pelvis was performed following the standard protocol during bolus administration of intravenous contrast.  Contrast: 64m OMNIPAQUE IOHEXOL 300 MG/ML IJ SOLN  Comparison: 09/06/2010.  Findings: Linear atelectasis in the left lower lobe.  No pericardial or pleural effusion.  Degenerative changes at the costovertebral junctions. No acute osseous abnormality identified.  Small volume of pelvic free fluid, decreased compared the prior. Rectum and sigmoid within normal limits.  Unremarkable bladder. Stable small pelvic phleboliths.  The colon appears normal to the level of the neo-terminal ileum. The TIA is abnormally thickened and patulous.  Wall thickening up to 7 mm (previously 9 mm).  Mild stenotic lesion in the proximal TI re-identified (series 2 image 75) with  upstream flocculated material and mildly dilated small bowel loops up to the 42 mm diameter. There might be a second tandem stenosis in the distal small bowel on image 69, versus kinked appearance.  Trace interloop fluid (image 52).  Further upstream small bowel loops are dilated with some air-fluid levels.  Oral contrast has not reached the segment.  There is a low tapering of the more proximal small bowel.  No confluent mesenteric inflammation.  The most proximal jejunum is unremarkable.  The stomach and duodenum are not dilated.   Negative liver, gallbladder, spleen and adrenal glands.  Stable prominent main pancreatic duct, otherwise negative pancreas.  Portal venous system and major arterial structures are within normal limits for age.  Stable kidneys. Small retroperitoneal and mesenteric lymph nodes are stable. No free air.  IMPRESSION: 1.  Sequelae of chronic inflammatory bowel disease.  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  Ileum wall thickening has decreased from prior. Small volume of mesenteric and pelvic free fluid. 2.  No new abnormality in the abdomen or pelvis. 3.  The left lower lobe atelectasis.  Original Report Authenticated By: HJoni FearsIII, M.D.    KUB reveals a 5-1/2 cm dilated small intestine or with partial small bowel obstruction. There is a moderate amount of stool in the right colon with contrast from the CT scan having reached the colon  Assessment: Partial small bowel obstruction associated with Crohn's disease.  Plan: We'll continue to treat with IV corticosteroids short-term for at least another day and repeat KUB. Will advance to clear liquid diet and reassess for improvement tomorrow. Could need surgical consult repeat CT scan at some point etc.    Ipek Westra C 01/21/2012, 12:00 PM

## 2012-01-21 NOTE — Progress Notes (Signed)
Patient NPO with a history of diabetes.  Noted that patient's CBG and insulin schedule is AC/HS.  Notified Dr. Dorthula Nettles of current CBG schedule and asked if he would like CBGs taken more frequently due to NPO status.  No new orders received at this time.  Will continue to monitor.

## 2012-01-21 NOTE — Progress Notes (Signed)
Resident Co-sign Daily Note: I have seen the patient and reviewed the daily progress note by Sharyon Cable MS 3 and discussed the care of the patient with them.  See below for documentation of my findings, assessment, and plans.  Subjective: abd pain improved since admission, distension improved as well. tolerated clears today. Required pain meds early this am, nothing after that.   Objective: Vital signs in last 24 hours: Filed Vitals:   01/20/12 0905 01/20/12 2136 01/21/12 0502 01/21/12 1348  BP: 112/72 114/70 125/75 116/68  Pulse: 68 76 78 83  Temp:  98 F (36.7 C) 97.7 F (36.5 C) 98.7 F (37.1 C)  TempSrc:  Oral  Oral  Resp: 20 18 18 20   SpO2: 95% 95% 96% 96%   Physical Exam:   General Appearance:     Filed Vitals:   01/20/12 0905 01/20/12 2136 01/21/12 0502 01/21/12 1348  BP: 112/72 114/70 125/75 116/68  Pulse: 68 76 78 83  Temp:  98 F (36.7 C) 97.7 F (36.5 C) 98.7 F (37.1 C)  TempSrc:  Oral  Oral  Resp: 20 18 18 20   SpO2: 95% 95% 96% 96%     Alert, cooperative, no distress, appears stated age  Lungs:     Clear to auscultation bilaterally, respirations unlabored  Chest wall:    No tenderness or deformity  Heart:    Regular rate and rhythm, S1 and S2 normal, no murmur, rub   or gallop  Abdomen:     Mildly distended, Soft, non-tender, bowel sounds active all four quadrants,    no masses, no organomegaly  Extremities:   Extremities normal, atraumatic, no cyanosis or edema  Pulses:   2+ and symmetric all extremities  Skin:   Skin color, texture, turgor normal, no rashes or lesions  Neurologic:  nonfocal grossly    Lab Results: Reviewed and documented in Electronic Record Micro Results: Reviewed and documented in Electronic Record Studies/Results: Reviewed and documented in Electronic Record Medications: I have reviewed the patient's current medications. Scheduled Meds:   . enoxaparin  40 mg Subcutaneous Q24H  . HYDROmorphone      . insulin aspart  0-9 Units  Subcutaneous TID WC  . methylPREDNISolone (SOLU-MEDROL) injection  40 mg Intravenous Q12H   Continuous Infusions:   . sodium chloride 150 mL/hr at 01/21/12 0934   PRN Meds:.HYDROmorphone, ondansetron (ZOFRAN) IV, ondansetron Assessment/Plan: Active Problems:  CD (Crohn's disease)  DM (diabetes mellitus)  1. CD flare - improving clinically with iv steroid 40 bid, dilaudid - repeat abd xray tomorrow am to follow progress to follow Small bowel loop dilatation (5.5 cm today)  - advance to full liquid tomorrow and may switch to po meds. GI recommends surgical consult, will consider if clinical course deteriorates.  Will appreciate GI recs on starting 5-ASA (pentasa did not work well 2 yrs ago) or other maintenance treatment  to prevent frequent exacerbations and hospitalization from CD flare in this patient.  - follow chemistry, C-diff pcr  2. DM - CBGs 170-190 on SSI. Change to moderate scale    LOS: 1 day   Juston Goheen 01/21/2012, 3:12 PM

## 2012-01-22 ENCOUNTER — Inpatient Hospital Stay (HOSPITAL_COMMUNITY): Payer: Self-pay

## 2012-01-22 DIAGNOSIS — K5090 Crohn's disease, unspecified, without complications: Principal | ICD-10-CM

## 2012-01-22 LAB — CLOSTRIDIUM DIFFICILE BY PCR: Toxigenic C. Difficile by PCR: NEGATIVE

## 2012-01-22 LAB — CBC
HCT: 41 % (ref 39.0–52.0)
Hemoglobin: 13.9 g/dL (ref 13.0–17.0)
MCH: 28.4 pg (ref 26.0–34.0)
MCHC: 33.9 g/dL (ref 30.0–36.0)
MCV: 83.8 fL (ref 78.0–100.0)
Platelets: 257 10*3/uL (ref 150–400)
RBC: 4.89 MIL/uL (ref 4.22–5.81)
RDW: 14.1 % (ref 11.5–15.5)
WBC: 7.4 10*3/uL (ref 4.0–10.5)

## 2012-01-22 LAB — GLUCOSE, CAPILLARY
Glucose-Capillary: 196 mg/dL — ABNORMAL HIGH (ref 70–99)
Glucose-Capillary: 143 mg/dL — ABNORMAL HIGH (ref 70–99)
Glucose-Capillary: 182 mg/dL — ABNORMAL HIGH (ref 70–99)
Glucose-Capillary: 245 mg/dL — ABNORMAL HIGH (ref 70–99)

## 2012-01-22 LAB — BASIC METABOLIC PANEL
BUN: 9 mg/dL (ref 6–23)
CO2: 24 mEq/L (ref 19–32)
Calcium: 8.3 mg/dL — ABNORMAL LOW (ref 8.4–10.5)
Chloride: 108 mEq/L (ref 96–112)
Creatinine, Ser: 0.74 mg/dL (ref 0.50–1.35)
GFR calc Af Amer: >90 mL/min (ref >90)
GFR calc non Af Amer: >90 mL/min (ref >90)
Glucose, Bld: 205 mg/dL — ABNORMAL HIGH (ref 70–99)
Potassium: 4 mEq/L (ref 3.5–5.1)
Sodium: 141 mEq/L (ref 135–145)

## 2012-01-22 MED ORDER — INSULIN GLARGINE 100 UNIT/ML ~~LOC~~ SOLN
5.0000 [IU] | Freq: Every day | SUBCUTANEOUS | Status: DC
  Administered 2012-01-22: 5 [IU] via SUBCUTANEOUS
  Filled 2012-01-22: qty 3

## 2012-01-22 NOTE — Progress Notes (Signed)
Subjective: Patient feels better. Did not have bowel movement yet- but feels it coming. Advanced to full liquid diet. Denies any chest pain, short of breath, nausea, vomiting, abdominal pain.  Objective: Vital signs in last 24 hours: Filed Vitals:   01/21/12 0502 01/21/12 1348 01/21/12 2206 01/22/12 0619  BP: 125/75 116/68 123/69 110/56  Pulse: 78 83 74 67  Temp: 97.7 F (36.5 C) 98.7 F (37.1 C) 98.2 F (36.8 C) 97 F (36.1 C)  TempSrc:  Oral    Resp: 18 20 18 18   SpO2: 96% 96% 96% 95%   Weight change:   Intake/Output Summary (Last 24 hours) at 01/22/12 1417 Last data filed at 01/21/12 2300  Gross per 24 hour  Intake 2967.5 ml  Output      0 ml  Net 2967.5 ml   Physical Exam: General: resting in bed HEENT: PERRL, EOMI, no scleral icterus Cardiac: RRR, no rubs, murmurs or gallops Pulm: clear to auscultation bilaterally, moving normal volumes of air Abd: soft, nontender, mildly distended, BS hypoactive. Ext: warm and well perfused, no pedal edema Neuro: alert and oriented X3, cranial nerves II-XII grossly intact  Lab Results: Basic Metabolic Panel:  Lab 54/62/70 0630 01/21/12 0510 01/20/12 1146  NA 141 136 --  K 4.0 3.8 --  CL 108 101 --  CO2 24 24 --  GLUCOSE 205* 186* --  BUN 9 9 --  CREATININE 0.74 0.73 --  CALCIUM 8.3* 8.7 --  MG -- -- 2.0  PHOS -- -- 2.7   Liver Function Tests:  Lab 01/20/12 0500  AST 24  ALT 23  ALKPHOS 85  BILITOT 0.3  PROT 7.4  ALBUMIN 3.6    Lab 01/20/12 0500  LIPASE 51  AMYLASE --   CBC:  Lab 01/22/12 0630 01/21/12 0510 01/20/12 0500  WBC 7.4 10.7* --  NEUTROABS -- -- 5.5  HGB 13.9 15.5 --  HCT 41.0 44.3 --  MCV 83.8 83.3 --  PLT 257 276 --   CBG:  Lab 01/22/12 1125 01/22/12 0559 01/21/12 2157 01/21/12 1640 01/21/12 1057 01/21/12 0617  GLUCAP 143* 196* 212* 204* 174* 184*   Hemoglobin A1C:  Lab 01/20/12 1312  HGBA1C 10.3*   Urinalysis:  Lab 01/21/12 1503  COLORURINE YELLOW  LABSPEC 1.011  PHURINE  6.0  GLUCOSEU 500*  HGBUR NEGATIVE  BILIRUBINUR NEGATIVE  KETONESUR 15*  PROTEINUR NEGATIVE  UROBILINOGEN 0.2  NITRITE NEGATIVE  LEUKOCYTESUR NEGATIVE   Studies/Results: Abd 1 View (kub)  01/21/2012  *RADIOLOGY REPORT*  Clinical Data: Crohn's disease.  Abdominal pain.  ABDOMEN - 1 VIEW  Comparison: CT abdomen pelvis 01/20/2012.  Findings: Contrast material from the patient's CT scan is now in the ascending colon.  There is persistent dilatation of small bowel with loops measuring up to 5.5 cm.  IMPRESSION: Findings compatible with persistent partial small bowel obstruction.  Original Report Authenticated By: Arvid Right. Luther Parody, M.D.   Dg Abd 2 Views  01/22/2012  *RADIOLOGY REPORT*  Clinical Data: 46 year old male - follow-up partial small bowel obstruction.  ABDOMEN - 2 VIEW  Comparison: 01/21/2012  Findings: Dilated small bowel loops of decreased in caliber since the prior study. Oral contrast within the colon is again noted. There is no evidence of pneumoperitoneum or suspicious calcifications.  IMPRESSION: Improving dilated small bowel loops/partial small bowel obstruction.  Original Report Authenticated By: Lura Em, M.D.   Medications: I have reviewed the patient's current medications. Scheduled Meds:   . enoxaparin  40 mg Subcutaneous Q24H  .  insulin aspart  0-15 Units Subcutaneous TID WC  . methylPREDNISolone (SOLU-MEDROL) injection  40 mg Intravenous Q12H  . DISCONTD: insulin aspart  0-9 Units Subcutaneous TID WC   Continuous Infusions:   . sodium chloride 150 mL/hr at 01/22/12 1151   PRN Meds:.HYDROmorphone, ondansetron (ZOFRAN) IV, ondansetron Assessment/Plan:    Acute CD (Crohn's disease)  Flare: Patient with partial small bowel obstruction likely due to acute CD flareup. 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 Not on any medications at home. - Continue IV Solu-Medrol- transition to by mouth on Monday and taper gradually. - When  necessary Zofran for nausea and vomiting. - Followup closely with GI as outpatient. - Will likely need immuno suppressive therapy- Azathioprine versus infliximab/adalimumab for maintenance therapy- per GI. - Will likely need surgery in near future.   DM (diabetes mellitus): Last HbA1c 10.3- 01/20/12  - Continue sensitive sliding scale. - Will add Lantus 5 units each bedtime.   DVT prophylaxis: Lovenox    LOS: 2 days   Kymberly Blomberg 01/22/2012, 2:17 PM

## 2012-01-22 NOTE — Progress Notes (Signed)
Subjective: No nausea, vomiting. Less abdominal pain. No stool, but is passing large amounts of flatus. Tolerating clear liquid diet.  Objective: Vital signs in last 24 hours: Temp:  [97 F (36.1 C)-98.7 F (37.1 C)] 97 F (36.1 C) (03/09 0619) Pulse Rate:  [67-83] 67  (03/09 0619) Resp:  [18-20] 18  (03/09 0619) BP: (110-123)/(56-69) 110/56 mmHg (03/09 0619) SpO2:  [95 %-96 %] 95 % (03/09 0619) Weight change:  Last BM Date: 01/19/12  PE: GEN:  NAD ABD:  Soft, very mildly distended, minimal tympany, normoactive bowel sounds  Lab Results: CBC    Component Value Date/Time   WBC 7.4 01/22/2012 0630   RBC 4.89 01/22/2012 0630   HGB 13.9 01/22/2012 0630   HCT 41.0 01/22/2012 0630   PLT 257 01/22/2012 0630   MCV 83.8 01/22/2012 0630   MCH 28.4 01/22/2012 0630   MCHC 33.9 01/22/2012 0630   RDW 14.1 01/22/2012 0630   LYMPHSABS 1.7 01/20/2012 0500   MONOABS 1.1* 01/20/2012 0500   EOSABS 0.1 01/20/2012 0500   BASOSABS 0.0 01/20/2012 0500    CMP     Component Value Date/Time   NA 141 01/22/2012 0630   K 4.0 01/22/2012 0630   CL 108 01/22/2012 0630   CO2 24 01/22/2012 0630   GLUCOSE 205* 01/22/2012 0630   BUN 9 01/22/2012 0630   CREATININE 0.74 01/22/2012 0630   CALCIUM 8.3* 01/22/2012 0630   PROT 7.4 01/20/2012 0500   ALBUMIN 3.6 01/20/2012 0500   AST 24 01/20/2012 0500   ALT 23 01/20/2012 0500   ALKPHOS 85 01/20/2012 0500   BILITOT 0.3 01/20/2012 0500   GFRNONAA >90 01/22/2012 0630   GFRAA >90 01/22/2012 0630   ABD xray:  I have reviewed; Small bowel diameter yesterday + today both ~ 5 cm.  Assessment:  1.  Partial small bowel obstruction from Crohn's disease.  I suspect component of fibrostenotic and active inflammatory disease as both contributing to his partial SBO.  Plan:  1.  Continue supportive care with IV steroids, as well as prn antiemetics. 2.  Advance diet to full liquids. 3.  IV steroids likely for another day or two, with hopeful transition to oral steroid taper thereafter, if he continues to  improve.   4.  Patient will need regular outpatient follow-up, and will likely require chronic immunomodulator (azathioprine) or biologic therapy (e.g., infliximab or adalimumab) for maintenance of remission therapy as outpatient. 5.  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   Landry Dyke 01/22/2012, 10:24 AM

## 2012-01-23 LAB — GLUCOSE, CAPILLARY
Glucose-Capillary: 197 mg/dL — ABNORMAL HIGH (ref 70–99)
Glucose-Capillary: 316 mg/dL — ABNORMAL HIGH (ref 70–99)
Glucose-Capillary: 301 mg/dL — ABNORMAL HIGH (ref 70–99)
Glucose-Capillary: 338 mg/dL — ABNORMAL HIGH (ref 70–99)

## 2012-01-23 MED ORDER — INSULIN ASPART 100 UNIT/ML ~~LOC~~ SOLN
0.0000 [IU] | Freq: Every day | SUBCUTANEOUS | Status: DC
  Administered 2012-01-23: 4 [IU] via SUBCUTANEOUS
  Administered 2012-01-24: 3 [IU] via SUBCUTANEOUS
  Administered 2012-01-25 – 2012-01-26 (×2): 2 [IU] via SUBCUTANEOUS

## 2012-01-23 MED ORDER — INSULIN GLARGINE 100 UNIT/ML ~~LOC~~ SOLN
20.0000 [IU] | Freq: Every day | SUBCUTANEOUS | Status: DC
  Administered 2012-01-23 – 2012-01-24 (×2): 20 [IU] via SUBCUTANEOUS

## 2012-01-23 MED ORDER — INSULIN ASPART 100 UNIT/ML ~~LOC~~ SOLN
0.0000 [IU] | Freq: Three times a day (TID) | SUBCUTANEOUS | Status: DC
  Administered 2012-01-23: 7 [IU] via SUBCUTANEOUS
  Administered 2012-01-24 (×3): 2 [IU] via SUBCUTANEOUS
  Administered 2012-01-26: 5 [IU] via SUBCUTANEOUS
  Administered 2012-01-26 – 2012-01-27 (×4): 3 [IU] via SUBCUTANEOUS
  Administered 2012-01-27: 09:00:00 via SUBCUTANEOUS
  Administered 2012-01-28 (×2): 3 [IU] via SUBCUTANEOUS

## 2012-01-23 MED ORDER — PREDNISONE 20 MG PO TABS
20.0000 mg | ORAL_TABLET | Freq: Two times a day (BID) | ORAL | Status: DC
  Administered 2012-01-23 – 2012-01-24 (×2): 20 mg via ORAL
  Filled 2012-01-23 (×4): qty 1

## 2012-01-23 NOTE — Progress Notes (Signed)
Subjective: Several bowel movements yesterday. Tolerating full liquids.  Objective: Vital signs in last 24 hours: Temp:  [97.7 F (36.5 C)-98.3 F (36.8 C)] 97.9 F (36.6 C) (03/10 0526) Pulse Rate:  [47-76] 47  (03/10 0526) Resp:  [16-18] 16  (03/10 0526) BP: (116-123)/(64-69) 123/67 mmHg (03/10 0526) SpO2:  [95 %-96 %] 96 % (03/09 2118) Weight change:  Last BM Date: 01/22/12  PE: GEN:  NAD ABD:  Still somewhat distended + tympanic, but non tender and no peritonitis  Assessment:  1.  Crohn's disease with partial small bowel obstruction, improving with medical therapy. 2.  Suspect patient has fibrostenotic and active inflammatory components to his Crohn's and small bowel obstruction.  Plan:  1.  Continue to advance diet. 2.  Transition to oral steroids. 3.  Hopefully home in the next day or two.  Will need close outpatient GI follow-up, for discussion of long-term maintenance of remission therapies.   Landry Dyke 01/23/2012, 10:12 AM

## 2012-01-23 NOTE — Progress Notes (Signed)
Subjective: Patient feels better. Had multiple bowel movements yesterday- mostly watery. Tolerated liquid diet yesterday- advanced to a regular diet today. Denies any chest pain, short of breath, nausea, vomiting, abdominal pain. DC home Monday or Tuesday per GI.  Objective: Vital signs in last 24 hours: Filed Vitals:   2012/02/18 0619 02-18-12 1417 February 18, 2012 2118 01/23/12 0526  BP: 110/56 116/69 121/64 123/67  Pulse: 67 62 76 47  Temp: 97 F (36.1 C) 98.3 F (36.8 C) 97.7 F (36.5 C) 97.9 F (36.6 C)  TempSrc:  Oral Oral Oral  Resp: 18 18 18 16   SpO2: 95% 95% 96%    Weight change:  No intake or output data in the 24 hours ending 01/23/12 1338 Physical Exam: General: resting in bed HEENT: PERRL, EOMI, no scleral icterus Cardiac: RRR, no rubs, murmurs or gallops Pulm: clear to auscultation bilaterally, moving normal volumes of air Abd: soft, nontender, mildly distended, BS normal. Ext: warm and well perfused, no pedal edema Neuro: alert and oriented X3, cranial nerves II-XII grossly intact  Lab Results: Basic Metabolic Panel:  Lab 68/34/19 0630 01/21/12 0510 01/20/12 1146  NA 141 136 --  K 4.0 3.8 --  CL 108 101 --  CO2 24 24 --  GLUCOSE 205* 186* --  BUN 9 9 --  CREATININE 0.74 0.73 --  CALCIUM 8.3* 8.7 --  MG -- -- 2.0  PHOS -- -- 2.7   Liver Function Tests:  Lab 01/20/12 0500  AST 24  ALT 23  ALKPHOS 85  BILITOT 0.3  PROT 7.4  ALBUMIN 3.6    Lab 01/20/12 0500  LIPASE 51  AMYLASE --   CBC:  Lab 02-18-12 0630 01/21/12 0510 01/20/12 0500  WBC 7.4 10.7* --  NEUTROABS -- -- 5.5  HGB 13.9 15.5 --  HCT 41.0 44.3 --  MCV 83.8 83.3 --  PLT 257 276 --   CBG:  Lab 01/23/12 1158 01/23/12 0529 18-Feb-2012 2117 02-18-12 1637 18-Feb-2012 1125 02/18/2012 0559  GLUCAP 316* 197* 245* 182* 143* 196*   Hemoglobin A1C:  Lab 01/20/12 1312  HGBA1C 10.3*   Urinalysis:  Lab 01/21/12 1503  COLORURINE YELLOW  LABSPEC 1.011  PHURINE 6.0  GLUCOSEU 500*  HGBUR  NEGATIVE  BILIRUBINUR NEGATIVE  KETONESUR 15*  PROTEINUR NEGATIVE  UROBILINOGEN 0.2  NITRITE NEGATIVE  LEUKOCYTESUR NEGATIVE   Studies/Results: Dg Abd 2 Views  02-18-2012  *RADIOLOGY REPORT*  Clinical Data: 46 year old male - follow-up partial small bowel obstruction.  ABDOMEN - 2 VIEW  Comparison: 01/21/2012  Findings: Dilated small bowel loops of decreased in caliber since the prior study. Oral contrast within the colon is again noted. There is no evidence of pneumoperitoneum or suspicious calcifications.  IMPRESSION: Improving dilated small bowel loops/partial small bowel obstruction.  Original Report Authenticated By: Lura Em, M.D.   Medications: I have reviewed the patient's current medications. Scheduled Meds:    . enoxaparin  40 mg Subcutaneous Q24H  . insulin aspart  0-5 Units Subcutaneous QHS  . insulin aspart  0-9 Units Subcutaneous TID WC  . insulin glargine  20 Units Subcutaneous QHS  . predniSONE  20 mg Oral BID WC  . DISCONTD: insulin aspart  0-15 Units Subcutaneous TID WC  . DISCONTD: insulin glargine  5 Units Subcutaneous QHS  . DISCONTD: methylPREDNISolone (SOLU-MEDROL) injection  40 mg Intravenous Q12H   Continuous Infusions:    . sodium chloride 1,000 mL (01/23/12 1300)   PRN Meds:.HYDROmorphone, ondansetron (ZOFRAN) IV, ondansetron Assessment/Plan:    Acute CD (Crohn's  disease)  Flare: Patient with partial small bowel obstruction likely due to acute CD flareup. 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 Not on any medications at home. - DC'd IV steroids today per Dr. Paulita Fujita- started on prednisone 20 mg twice a day. - When necessary Zofran for nausea and vomiting. - Followup closely with GI as outpatient. - Will likely need immuno suppressive therapy- Azathioprine versus infliximab/adalimumab for maintenance therapy- per GI. - Will likely need surgery in near future.   DM (diabetes mellitus): Last HbA1c 10.3- 01/20/12  -  Continue sensitive sliding scale and add bedtime coverage. - Increase Lantus 20 units each bedtime.   DVT prophylaxis: Lovenox    LOS: 3 days   Jared [name] 01/23/2012, 1:38 PM

## 2012-01-24 ENCOUNTER — Inpatient Hospital Stay (HOSPITAL_COMMUNITY): Payer: Self-pay

## 2012-01-24 DIAGNOSIS — K56609 Unspecified intestinal obstruction, unspecified as to partial versus complete obstruction: Secondary | ICD-10-CM

## 2012-01-24 DIAGNOSIS — K5090 Crohn's disease, unspecified, without complications: Secondary | ICD-10-CM

## 2012-01-24 LAB — BASIC METABOLIC PANEL
BUN: 9 mg/dL (ref 6–23)
CO2: 23 mEq/L (ref 19–32)
Calcium: 8.6 mg/dL (ref 8.4–10.5)
Chloride: 107 mEq/L (ref 96–112)
Creatinine, Ser: 0.78 mg/dL (ref 0.50–1.35)
GFR calc Af Amer: >90 mL/min (ref >90)
GFR calc non Af Amer: >90 mL/min (ref >90)
Glucose, Bld: 213 mg/dL — ABNORMAL HIGH (ref 70–99)
Potassium: 3.7 mEq/L (ref 3.5–5.1)
Sodium: 141 mEq/L (ref 135–145)

## 2012-01-24 LAB — SURGICAL PCR SCREEN
MRSA, PCR: NEGATIVE
Staphylococcus aureus: NEGATIVE

## 2012-01-24 LAB — GLUCOSE, CAPILLARY
Glucose-Capillary: 198 mg/dL — ABNORMAL HIGH (ref 70–99)
Glucose-Capillary: 183 mg/dL — ABNORMAL HIGH (ref 70–99)
Glucose-Capillary: 169 mg/dL — ABNORMAL HIGH (ref 70–99)
Glucose-Capillary: 265 mg/dL — ABNORMAL HIGH (ref 70–99)

## 2012-01-24 MED ORDER — METHYLPREDNISOLONE SODIUM SUCC 40 MG IJ SOLR
40.0000 mg | Freq: Two times a day (BID) | INTRAMUSCULAR | Status: DC
  Administered 2012-01-24: 40 mg via INTRAVENOUS
  Filled 2012-01-24 (×4): qty 1

## 2012-01-24 MED ORDER — TUBERCULIN PPD 5 UNIT/0.1ML ID SOLN
5.0000 [IU] | Freq: Once | INTRADERMAL | Status: AC
  Administered 2012-01-24: 5 [IU] via INTRADERMAL
  Filled 2012-01-24 (×2): qty 0.1

## 2012-01-24 MED ORDER — ERTAPENEM SODIUM 1 G IJ SOLR
1.0000 g | INTRAMUSCULAR | Status: AC
  Administered 2012-01-25: 1 g via INTRAVENOUS
  Filled 2012-01-24 (×2): qty 1

## 2012-01-24 MED ORDER — DOCUSATE SODIUM 100 MG PO CAPS
100.0000 mg | ORAL_CAPSULE | Freq: Two times a day (BID) | ORAL | Status: DC
  Administered 2012-01-24 (×2): 100 mg via ORAL
  Filled 2012-01-24 (×4): qty 1

## 2012-01-24 MED ORDER — HYDRALAZINE HCL 20 MG/ML IJ SOLN
10.0000 mg | INTRAMUSCULAR | Status: DC | PRN
  Filled 2012-01-24: qty 0.5

## 2012-01-24 MED ORDER — CHLORHEXIDINE GLUCONATE 4 % EX LIQD
1.0000 "application " | Freq: Once | CUTANEOUS | Status: AC
  Administered 2012-01-25: 1 via TOPICAL
  Filled 2012-01-24: qty 15

## 2012-01-24 MED ORDER — KETOROLAC TROMETHAMINE 30 MG/ML IJ SOLN
30.0000 mg | Freq: Four times a day (QID) | INTRAMUSCULAR | Status: DC | PRN
  Administered 2012-01-24 (×2): 30 mg via INTRAVENOUS
  Filled 2012-01-24 (×2): qty 1

## 2012-01-24 NOTE — Progress Notes (Signed)
Resident Co-sign Daily Note: I have seen the patient and reviewed the daily progress note by Anola Gurney MS 3 and discussed the care of the patient with them.  See below for documentation of my findings, assessment, and plans.  Medical Student Daily Progress Note  Subjective: Pt is a 46 yo male with a Hx of Crohn disease and diabetes, who presents with 1 day hx of abdominal pain and distention. CT is consistent with acute Crohn flare up. Pt had 7x flare ups in the past. Problem with med adherence due to socioeconomic reasons. 24 hr events:  - seen by GI consult: recs ADAT, po steroids, d/c today or tomorrow, f/u GI  - This morning:   - endorses worse pain on rt abd and bloating.  - last stool was on Sat 7-8x.  - ambulating  - V/S.  - no fever, sweating, eye pain, vision probs, anal pain, blood in stool, mouth ulcers, diarrhea, erythema nodosum, arthritis, sob, chest pain.   Objective: Vital signs in last 24 hours: Filed Vitals:   01/23/12 0526 01/23/12 1413 01/23/12 2245 01/24/12 0700  BP: 123/67 116/64 147/69 130/62  Pulse: 47 53 60 62  Temp: 97.9 F (36.6 C) 98.3 F (36.8 C) 98.4 F (36.9 C) 98.5 F (36.9 C)  TempSrc: Oral Oral    Resp: 16 18 18 18   SpO2:  98% 92% 92%   Weight change:   Intake/Output Summary (Last 24 hours) at 01/24/12 0851 Last data filed at 01/24/12 0100  Gross per 24 hour  Intake   7500 ml  Output      0 ml  Net   7500 ml   Physical Exam:  Vitals reviewed. General: resting in bed, already ate breakfast, good appetite, asked nurse for more. Cardiac: RRR, no rubs, murmurs or gallops Pulm: clear to auscultation bilaterally, no wheezes, rales, or rhonchi Abd: BS active, distended, nontender, no rebound tenderness, percussion tympanic x2 quadrants, dull in LLQ and RLQ. Ext: warm and well perfused, no pedal edema, no tenderness.  Lab Results: Basic Metabolic Panel:  Lab 88/50/27 0515 01/22/12 0630 01/20/12 1146  Simone Tuckey 141 141 --  K 3.7 4.0 --  CL 107  108 --  CO2 23 24 --  GLUCOSE 213* 205* --  BUN 9 9 --  CREATININE 0.78 0.74 --  CALCIUM 8.6 8.3* --  MG -- -- 2.0  PHOS -- -- 2.7   Liver Function Tests:  Lab 01/20/12 0500  AST 24  ALT 23  ALKPHOS 85  BILITOT 0.3  PROT 7.4  ALBUMIN 3.6    Lab 01/20/12 0500  LIPASE 51  AMYLASE --   No results found for this basename: AMMONIA:2 in the last 168 hours CBC:  Lab 01/22/12 0630 01/21/12 0510 01/20/12 0500  WBC 7.4 10.7* --  NEUTROABS -- -- 5.5  HGB 13.9 15.5 --  HCT 41.0 44.3 --  MCV 83.8 83.3 --  PLT 257 276 --    CBG:  Lab 01/24/12 0628 01/23/12 2210 01/23/12 1635 01/23/12 1158 01/23/12 0529 01/22/12 2117  GLUCAP 198* 338* 301* 316* 197* 245*   Hemoglobin A1C:  Lab 01/20/12 1312  HGBA1C 10.3*    Studies/Results: No results found. Medications: I have reviewed the patient's current medications. Scheduled Meds:    . docusate sodium  100 mg Oral BID  . enoxaparin  40 mg Subcutaneous Q24H  . insulin aspart  0-5 Units Subcutaneous QHS  . insulin aspart  0-9 Units Subcutaneous TID WC  . insulin glargine  20 Units Subcutaneous QHS  . predniSONE  20 mg Oral BID WC  . DISCONTD: insulin aspart  0-15 Units Subcutaneous TID WC  . DISCONTD: insulin glargine  5 Units Subcutaneous QHS  . DISCONTD: methylPREDNISolone (SOLU-MEDROL) injection  40 mg Intravenous Q12H   Continuous Infusions:    . sodium chloride 150 mL/hr at 01/24/12 0324   PRN Meds:.HYDROmorphone, ondansetron (ZOFRAN) IV, ondansetron Assessment/Plan:  46 year old man with past medical history of Crohn's disease for past 5 years and diabetes comes to the emergency department with what seems to be his usual Crohn's disease flareup and small bowel obstruction as a complication.  #1 Crohn's disease flareup and SBO  - Pt endorses worsening abd pain and bloating this am. Last stool was Sat 7-8x. - CBC and BMP wnl, stable.   - will keep pt for one more day bcz of worsening sx.  - per GI consult:    -  recs ADAT (currently on low fiber diet)   - IV solumedrol (was on po)   - likely need surg consult   - f/u GI outpt after d/c   - will likely require chronic immunomodulator (he was on pentasa before, but had prob w med adherence, dz worsened as a result)  - called surg consult, f/u with note  - f/u PPD test  - monitor for passage of stool  - on low fiber diet. ADAT.  - dilaudid -> toradol  - Last KUB on Sat shows obstruction is improving.  - C diff test negative  - consider SW/CM involvement due to socioeconomic factors in med adherence.  - consider counselling for smoking cessation.  - IV fluid resuscitation with normal saline   - Place NG tube if patient starts vomiting    - Zofran for nausea and vomiting    #2 DM, type II  - CBG 184 - A1c 10.3, was 8.6 a year ago. - SSI - lantus - will Restart metformin and gliburide at discharge   #3 DVT PPX - Lovenox     LOS: 4 days   This is a Careers information officer Note.  The care of the patient was discussed with Dr. Nicoletta Dress and the assessment and plan formulated with their assistance.  Please see their attached note for official documentation of the daily encounter.  Anola Gurney 01/24/2012, 8:51 AM

## 2012-01-24 NOTE — Progress Notes (Signed)
Internal Medicine Teaching Service Attending Note Date: 01/24/2012  Patient name: Jared [name]  Medical record number: [id number]  Date of birth: 11-01-1966    This patient has been seen and discussed with the house staff. Please see their note for complete details. I concur with their findings with the following additions/corrections:Mr. Wolfson is more distended over weekend and has quiet belly. Agree with Dr. Paulita Fujita that patient needs intensification of treatment and since he has appoinment with [facility] will get consult regarding ileal surgery given his many admissions for obstructive symptoms and narrowed ileum seen on abd CT 3 years ago.   Lars Mage 01/24/2012, 12:18 PM

## 2012-01-24 NOTE — Progress Notes (Signed)
Subjective: Worsening distention and pain after advancing diet. Passing flatus, but now no stool in  ~ 36 hours.  Objective: Vital signs in last 24 hours: Temp:  [98.3 F (36.8 C)-98.5 F (36.9 C)] 98.5 F (36.9 C) (03/11 0700) Pulse Rate:  [53-62] 62  (03/11 0700) Resp:  [18] 18  (03/11 0700) BP: (116-147)/(62-69) 130/62 mmHg (03/11 0700) SpO2:  [92 %-98 %] 92 % (03/11 0700) Weight change:  Last BM Date: 01/23/12  PE: GEN:  NAD ABD:  Worsening distention with tympany; mild generalized tenderness without peritonitis; + bowel sounds  Assessment:  1.  Crohn's with partial small bowel obstruction.  Symptoms worsening after advancing diet.  Plan:  1.  Decrease diet to sips clears. 2.  Change prednisone back to intravenous solumedrol. 3.  Abdominal xray today. 4.  Pending xray results, will likely need surgical consultation. 5.  Will follow.   Landry Dyke 01/24/2012, 8:56 AM

## 2012-01-24 NOTE — Consult Note (Signed)
Reason for Consult:Crohn's ileitis with stricture Consulting Surgeon: Cornett Referring Physician: Lane/Outlaw   HPI: Jared [name] is an 46 y.o. male with a hx of Crohn's X 5 yrs. He's been in the hospital multiple times for this and we have seen him before most recently in August of 2012. He has been on Prednisone and Pentasa in the past. He was actually scheduled to be seen as an outpt at [facility] but has now had another flare after eating some shrimp, vegetables, and rice. He has been admitted and treated by the medicine and GI teams. He did well over the past few days but his diet was advanced to solids yesterday and his distention and discomfort recurred. He is also no longer having BMs, though he is passing flatus. Surgery consult requested.  Past Medical History:  Past Medical History  Diagnosis Date  . Crohn disease 2008    with hospital admission in 2011, and 8/12 for flare up and SBO relieved with bowel rest. no suregery, no meds for CD  . Diabetes mellitus type II     Surgical History:  Past Surgical History  Procedure Date  . 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 Scrotal surgery ~ 1971    "took out a pellet"    Family History: History reviewed. No pertinent family history.  Social History:  reports that he has been smoking Cigarettes, about a pack a week.  He has a 7.5 pack-year smoking history. He quit smokeless tobacco use about 18 years ago. His smokeless tobacco use included Chew. He reports that he rarely drinks alcohol. He reports that he does not use illicit drugs.  Allergies: No Known Allergies  Medications:  Prior to Admission:  Prescriptions prior to admission  Medication Sig Dispense Refill  . glyBURIDE (DIABETA) 5 MG tablet Take 5 mg by mouth daily with breakfast.      . metFORMIN (GLUCOPHAGE) 500 MG tablet Take 500 mg by mouth 2 (two) times daily with a meal.        ROS: See HPI for pertinent findings, otherwise complete 10 system review  negative.  Physical Exam: Blood pressure 130/62, pulse 62, temperature 98.5 F (36.9 C), temperature source Oral, resp. rate 18, SpO2 92.00%.  General Appearance:  Alert, cooperative, no distress, appears stated age  Head:  Normocephalic, without obvious abnormality, atraumatic  ENT: Unremarkable  Neck: Supple, symmetrical, trachea midline, no adenopathy, thyroid: not enlarged, symmetric, no tenderness/mass/nodules  Lungs:   Clear to auscultation bilaterally, no w/r/r, respirations unlabored without use of accessory muscles.  Chest Wall:  No tenderness or deformity  Heart:  Regular rate and rhythm, S1, S2 normal, no murmur, rub or gallop. Carotids 2+ without bruit.  Abdomen:   Soft, but distended. Mildly tender with deep palpation of RLQ. Bowel sounds active all four quadrants,  no masses, no organomegaly.  Genitalia:  Normal. No hernias  Rectal:  Deferred.  Extremities: Extremities normal, atraumatic, no cyanosis or edema  Pulses: 2+ and symmetric  Skin: Skin color, texture, turgor normal, no rashes or lesions  Neurologic: Normal affect, no gross deficits.     Labs: CBC  Basename 01/22/12 0630  WBC 7.4  HGB 13.9  HCT 41.0  PLT 257   MET  Basename 01/24/12 0515 01/22/12 0630  NA 141 141  K 3.7 4.0  CL 107 108  CO2 23 24  GLUCOSE 213* 205*  BUN 9 9  CREATININE 0.78 0.74  CALCIUM 8.6 8.3*   No results  found for this basename: PROT,ALBUMIN,AST,ALT,ALKPHOS,BILITOT,BILIDIR,IBILI,LIPASE in the last 72 hours PT/INR No results found for this basename: LABPROT:2,INR:2 in the last 72 hours ABG No results found for this basename: PHART:2,PCO2:2,PO2:2,HCO3:2 in the last 72 hours    Dg Abd 2 Views  01/24/2012  *RADIOLOGY REPORT*  Clinical Data: Crohn's disease, small bowel obstruction and worsening abdominal distention.  ABDOMEN - 2 VIEW  Comparison: 01/22/2012 and CT from 01/20/2012  Findings: Upright and supine views of the abdomen were obtained. No evidence for free air.   There is gas and contrast within the colon.  There is dilated small bowel in the right abdomen but there appears to be fewer distended small bowel loops. Limited evaluation of the lung bases and difficult to exclude left basilar atelectasis.  IMPRESSION:  There are fewer dilated loops of small bowel in the right abdomen.  Findings are suggestive for improvement in the partial small bowel obstruction.  Original Report Authenticated By: Markus Daft, M.D.    Assessment/Plan: Active Problems:  Crohn's ileitis with stricture.  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 This may prove to require resection for definitive treatment, though his films actually look a little better. He is frustrated that it is interfering with his ability to work. Agree with reducing diet, IV steroids. Will d/w MD and return to discuss surgical options with pt. Thank you for consult.  DM (diabetes mellitus)-per primary.    Federico Flake PA-C 01/24/2012, 12:54 PM

## 2012-01-24 NOTE — Consult Note (Signed)
Pt seen and examined chart reviewed.  Agree with PA note.  He will need ileocecectomy at this point since he has failed medical management.  Risks include bleeding ,  Infection,  anastamosis leak,  Sepsis,  Hernia,  Wound infection,  Organ injury,  Death,  CVA,  MI,  DVT,  PE AND other operations.  He would like to proceed.

## 2012-01-24 NOTE — Progress Notes (Signed)
Inpatient Diabetes Program Recommendations  AACE/ADA: New Consensus Statement on Inpatient Glycemic Control (2009)  Target Ranges:  Prepandial:   less than 140 mg/dL      Peak postprandial:   less than 180 mg/dL (1-2 hours)      Critically ill patients:  140 - 180 mg/dL   Reason for Visit: Hyperglycemia  Inpatient Diabetes Program Recommendations Correction (SSI): Currently on sensitive correction scale.  Changing from prednisone to solumedrol.  Request increase in intensity of correction scale.  Request CBG's be checked q 4 hours and receive correction while NPO.  Thank you.  Results for Jared [name], Jared [name] (MRN [id number]) as of 01/24/2012 16:06  Ref. Range 01/23/2012 05:29 01/23/2012 11:58 01/23/2012 16:35 01/23/2012 22:10 01/24/2012 06:28  Glucose-Capillary Latest Range: 70-99 mg/dL 197 (H) 316 (H) 301 (H) 338 (H) 198 (H)

## 2012-01-24 NOTE — Progress Notes (Addendum)
Noted order for assistance with medication, pt received assistance in August 2012. Will attempt to assist however this is doubtful, also noted on last visit that application for Pentasa was completed by CM , however the CM was unable to reach the pt or family  post d/c to confirm completion of application.  Note also states that pt has an orange card and was referred to [facility] and [facility] on last admission.  Jasmine Pang RN MPH Case Manager [phone]

## 2012-01-24 NOTE — Progress Notes (Signed)
Met with pt and discussed d/c needs, pt states that he went to [facility] and was taking Metformin and gli  Only, nothing for his Crohns. Pt does not seem to recall application that was started for Pentasa during his last hospitalization, apparently this was not completed.  This CM discussed with pt that CM will attempt to start applications for any meds that GI consult may order however these will need to be completed by the patient post d/c as they often require copies of past tax records, etc. Also explained that some meds can be delivered to MD office only and may need to be administered by the MD.  Await GI consult.  Jasmine Pang RN MPH Case Manager [phone]

## 2012-01-24 NOTE — Progress Notes (Signed)
   Subjective: Patient states that he has not had any stool since Sunday. C/o abd "bloading."  endorses passing flatus. Objective: Vital signs in last 24 hours: Filed Vitals:   01/23/12 2245 01/24/12 0700 01/24/12 1300 01/24/12 1356  BP: 147/69 130/62 170/91 157/79  Pulse: 60 62 65 65  Temp: 98.4 F (36.9 C) 98.5 F (36.9 C)  98.5 F (36.9 C)  TempSrc:    Oral  Resp: 18 18 18 18   SpO2: 92% 92% 94% 100%   Physical Exam:  General: resting in bed, already ate breakfast, good appetite, asked nurse for more.  Cardiac: RRR, no rubs, murmurs or gallops  Pulm: clear to auscultation bilaterally, no wheezes, rales, or rhonchi  Abd: BS active, distended, nontender, no rebound tenderness, percussion tympanic. Ext: warm and well perfused, no pedal edema, no tenderness  Lab Results: Reviewed and documented in Electronic Record Micro Results: Reviewed and documented in Electronic Record Studies/Results: Reviewed and documented in Electronic Record Medications: I have reviewed the patient's current medications. Scheduled Meds:   . docusate sodium  100 mg Oral BID  . enoxaparin  40 mg Subcutaneous Q24H  . insulin aspart  0-5 Units Subcutaneous QHS  . insulin aspart  0-9 Units Subcutaneous TID WC  . insulin glargine  20 Units Subcutaneous QHS  . methylPREDNISolone (SOLU-MEDROL) injection  40 mg Intravenous Q12H  . tuberculin  5 Units Intradermal Once  . DISCONTD: predniSONE  20 mg Oral BID WC   Continuous Infusions:   . sodium chloride 150 mL/hr at 01/24/12 1242   PRN Meds:.hydrALAZINE, ketorolac, ondansetron (ZOFRAN) IV, ondansetron, DISCONTD: HYDROmorphone Assessment/Plan:  #1 Crohn's disease flareup and SBO      symptoms worsening with advanced diet. abd X ray imprpving. Gi recs increased steroids treatment and consult surgery. - surgery called and will follow - CL -steroids -IVF - PPD and LFT placed  #2 DM type II,  A1c 10.3, was 8.6 a year ago. -CBG monitor -SSI -  lantus  #3 DVT PPX - Lovenox   # Medical noncompliance, likely due to financial reasons -CM consulted.    LOS: 4 days   Lilliana Turner 01/24/2012, 2:14 PM

## 2012-01-25 ENCOUNTER — Other Ambulatory Visit: Payer: Self-pay

## 2012-01-25 ENCOUNTER — Encounter (HOSPITAL_COMMUNITY): Payer: Self-pay | Admitting: Anesthesiology

## 2012-01-25 ENCOUNTER — Inpatient Hospital Stay (HOSPITAL_COMMUNITY): Payer: Self-pay | Admitting: Anesthesiology

## 2012-01-25 ENCOUNTER — Encounter (HOSPITAL_COMMUNITY)
Admission: EM | Disposition: A | Discharge status: Home Health | Payer: Self-pay | Source: Home / Self Care | Attending: Internal Medicine

## 2012-01-25 DIAGNOSIS — K5090 Crohn's disease, unspecified, without complications: Secondary | ICD-10-CM

## 2012-01-25 HISTORY — PX: BOWEL RESECTION: SHX1257

## 2012-01-25 HISTORY — PX: LAPAROTOMY: SHX154

## 2012-01-25 LAB — GLUCOSE, CAPILLARY
Glucose-Capillary: 154 mg/dL — ABNORMAL HIGH (ref 70–99)
Glucose-Capillary: 124 mg/dL — ABNORMAL HIGH (ref 70–99)
Glucose-Capillary: 140 mg/dL — ABNORMAL HIGH (ref 70–99)
Glucose-Capillary: 141 mg/dL — ABNORMAL HIGH (ref 70–99)
Glucose-Capillary: 187 mg/dL — ABNORMAL HIGH (ref 70–99)
Glucose-Capillary: 213 mg/dL — ABNORMAL HIGH (ref 70–99)

## 2012-01-25 LAB — HEPATIC FUNCTION PANEL
ALT: 38 U/L (ref 0–53)
AST: 13 U/L (ref 0–37)
Albumin: 3 g/dL — ABNORMAL LOW (ref 3.5–5.2)
Alkaline Phosphatase: 74 U/L (ref 39–117)
Bilirubin, Direct: <0.1 mg/dL (ref 0.0–0.3)
Total Bilirubin: 0.4 mg/dL (ref 0.3–1.2)
Total Protein: 6.3 g/dL (ref 6.0–8.3)

## 2012-01-25 SURGERY — LAPAROTOMY, EXPLORATORY
Anesthesia: General | Site: Abdomen | Wound class: Clean Contaminated

## 2012-01-25 MED ORDER — VECURONIUM BROMIDE 10 MG IV SOLR
INTRAVENOUS | Status: DC | PRN
  Administered 2012-01-25: 1 mg via INTRAVENOUS

## 2012-01-25 MED ORDER — FENTANYL CITRATE 0.05 MG/ML IJ SOLN
INTRAMUSCULAR | Status: DC | PRN
  Administered 2012-01-25: 100 ug via INTRAVENOUS
  Administered 2012-01-25 (×4): 50 ug via INTRAVENOUS
  Administered 2012-01-25: 150 ug via INTRAVENOUS
  Administered 2012-01-25: 50 ug via INTRAVENOUS

## 2012-01-25 MED ORDER — NALOXONE HCL 0.4 MG/ML IJ SOLN: 0.4000 mg | INTRAMUSCULAR | Status: DC | PRN

## 2012-01-25 MED ORDER — KCL IN DEXTROSE-NACL 20-5-0.45 MEQ/L-%-% IV SOLN
INTRAVENOUS | Status: AC
  Administered 2012-01-25 – 2012-01-26 (×2): via INTRAVENOUS
  Administered 2012-01-26: 1000 mL via INTRAVENOUS
  Administered 2012-01-26 – 2012-01-27 (×2): via INTRAVENOUS
  Filled 2012-01-25 (×9): qty 1000

## 2012-01-25 MED ORDER — NEOSTIGMINE METHYLSULFATE 1 MG/ML IJ SOLN
INTRAMUSCULAR | Status: DC | PRN
  Administered 2012-01-25: 5 mg via INTRAVENOUS

## 2012-01-25 MED ORDER — KETOROLAC TROMETHAMINE 30 MG/ML IJ SOLN
INTRAMUSCULAR | Status: AC
  Filled 2012-01-25: qty 1

## 2012-01-25 MED ORDER — 0.9 % SODIUM CHLORIDE (POUR BTL) OPTIME
TOPICAL | Status: DC | PRN
  Administered 2012-01-25: 3000 mL

## 2012-01-25 MED ORDER — KETOROLAC TROMETHAMINE 15 MG/ML IJ SOLN
15.0000 mg | Freq: Four times a day (QID) | INTRAMUSCULAR | Status: AC | PRN
  Administered 2012-01-25: 15 mg via INTRAVENOUS
  Filled 2012-01-25: qty 1

## 2012-01-25 MED ORDER — SODIUM CHLORIDE 0.9 % IJ SOLN: 9.0000 mL | INTRAMUSCULAR | Status: DC | PRN

## 2012-01-25 MED ORDER — LACTATED RINGERS IV SOLN
INTRAVENOUS | Status: DC | PRN
  Administered 2012-01-25 (×3): via INTRAVENOUS

## 2012-01-25 MED ORDER — DIPHENHYDRAMINE HCL 50 MG/ML IJ SOLN
12.5000 mg | Freq: Four times a day (QID) | INTRAMUSCULAR | Status: DC | PRN
  Administered 2012-02-05 – 2012-02-06 (×5): 12.5 mg via INTRAVENOUS
  Administered 2012-02-06: 25 mg via INTRAVENOUS
  Administered 2012-02-06: 12.5 mg via INTRAVENOUS
  Filled 2012-01-25 (×6): qty 1

## 2012-01-25 MED ORDER — DIPHENHYDRAMINE HCL 12.5 MG/5ML PO ELIX: 12.5000 mg | ORAL_SOLUTION | Freq: Four times a day (QID) | ORAL | Status: DC | PRN

## 2012-01-25 MED ORDER — ONDANSETRON HCL 4 MG/2ML IJ SOLN
INTRAMUSCULAR | Status: DC | PRN
  Administered 2012-01-25: 4 mg via INTRAVENOUS

## 2012-01-25 MED ORDER — ONDANSETRON HCL 4 MG/2ML IJ SOLN: 4.0000 mg | Freq: Four times a day (QID) | INTRAMUSCULAR | Status: DC | PRN

## 2012-01-25 MED ORDER — GLYCOPYRROLATE 0.2 MG/ML IJ SOLN
INTRAMUSCULAR | Status: DC | PRN
  Administered 2012-01-25: .8 mg via INTRAVENOUS

## 2012-01-25 MED ORDER — ROCURONIUM BROMIDE 100 MG/10ML IV SOLN
INTRAVENOUS | Status: DC | PRN
  Administered 2012-01-25 (×2): 10 mg via INTRAVENOUS
  Administered 2012-01-25: 30 mg via INTRAVENOUS

## 2012-01-25 MED ORDER — MIDAZOLAM HCL 5 MG/5ML IJ SOLN
INTRAMUSCULAR | Status: DC | PRN
  Administered 2012-01-25: 2 mg via INTRAVENOUS

## 2012-01-25 MED ORDER — PROPOFOL 10 MG/ML IV EMUL
INTRAVENOUS | Status: DC | PRN
  Administered 2012-01-25: 180 mg via INTRAVENOUS
  Administered 2012-01-25: 20 mg via INTRAVENOUS

## 2012-01-25 MED ORDER — ENOXAPARIN SODIUM 40 MG/0.4ML ~~LOC~~ SOLN
40.0000 mg | SUBCUTANEOUS | Status: DC
  Administered 2012-01-26 – 2012-02-08 (×14): 40 mg via SUBCUTANEOUS
  Filled 2012-01-25 (×15): qty 0.4

## 2012-01-25 MED ORDER — ONDANSETRON HCL 4 MG/2ML IJ SOLN
4.0000 mg | Freq: Four times a day (QID) | INTRAMUSCULAR | Status: DC | PRN
Start: 2012-01-25 — End: 2012-01-25

## 2012-01-25 MED ORDER — HYDROMORPHONE 0.3 MG/ML IV SOLN
INTRAVENOUS | Status: DC
  Administered 2012-01-25: 0.6 mg via INTRAVENOUS
  Administered 2012-01-25: 1.1 mg via INTRAVENOUS
  Administered 2012-01-25: 12:00:00 via INTRAVENOUS
  Administered 2012-01-26: 3.3 mg via INTRAVENOUS
  Administered 2012-01-26: 1.8 mg via INTRAVENOUS
  Administered 2012-01-26: 0.3 mg via INTRAVENOUS
  Administered 2012-01-26: 14:00:00 via INTRAVENOUS
  Administered 2012-01-26: 2.4 mg via INTRAVENOUS
  Administered 2012-01-26: 05:00:00 via INTRAVENOUS
  Administered 2012-01-26 (×2): 1.8 mg via INTRAVENOUS
  Administered 2012-01-27: 1.5 mg via INTRAVENOUS
  Administered 2012-01-27: 06:00:00 via INTRAVENOUS
  Administered 2012-01-27: 0.3 mg via INTRAVENOUS
  Administered 2012-01-27: 0.6 mg via INTRAVENOUS
  Administered 2012-01-27: 3.6 mg via INTRAVENOUS
  Administered 2012-01-28: 1.2 mg via INTRAVENOUS
  Administered 2012-01-28: 3.3 mg via INTRAVENOUS
  Administered 2012-01-28: 1.5 mg via INTRAVENOUS
  Administered 2012-01-29: 2.7 mg via INTRAVENOUS
  Administered 2012-01-29: 3.6 mg via INTRAVENOUS
  Administered 2012-01-29: 23:00:00 via INTRAVENOUS
  Administered 2012-01-29: 3.6 mg via INTRAVENOUS
  Administered 2012-01-29: 12:00:00 via INTRAVENOUS
  Administered 2012-01-29 – 2012-01-30 (×2): 0.9 mg via INTRAVENOUS
  Administered 2012-01-30: 1.5 mg via INTRAVENOUS
  Administered 2012-01-30: 3.8 mg via INTRAVENOUS
  Administered 2012-01-30 – 2012-01-31 (×2): 0.9 mg via INTRAVENOUS
  Administered 2012-01-31: 13:00:00 via INTRAVENOUS
  Administered 2012-01-31: 0.9 mg via INTRAVENOUS
  Administered 2012-01-31: 1.5 mg via INTRAVENOUS
  Administered 2012-01-31: 0.9 mg via INTRAVENOUS
  Administered 2012-01-31: 0.3 mg via INTRAVENOUS
  Administered 2012-02-01: 16:00:00 via INTRAVENOUS
  Administered 2012-02-01: 0.6 mg via INTRAVENOUS
  Administered 2012-02-01: 2.1 mg via INTRAVENOUS
  Administered 2012-02-01: 2.7 mg via INTRAVENOUS
  Administered 2012-02-02: 1.2 mg via INTRAVENOUS
  Administered 2012-02-02: 15:00:00 via INTRAVENOUS
  Administered 2012-02-02: 0.6 mg via INTRAVENOUS
  Administered 2012-02-02: 3.9 mg via INTRAVENOUS
  Administered 2012-02-03: 1.2 mg via INTRAVENOUS
  Administered 2012-02-03: 0.6 mg via INTRAVENOUS
  Administered 2012-02-03: 1.8 mg via INTRAVENOUS
  Administered 2012-02-03: 4.5 mg via INTRAVENOUS
  Administered 2012-02-03: 09:00:00 via INTRAVENOUS
  Administered 2012-02-03: 1.2 mg via INTRAVENOUS
  Administered 2012-02-04: 2.4 mg via INTRAVENOUS
  Administered 2012-02-04: 0.6 mg via INTRAVENOUS
  Administered 2012-02-04: 20:00:00 via INTRAVENOUS
  Administered 2012-02-04: 1.8 mg via INTRAVENOUS
  Administered 2012-02-04: 4.2 mg via INTRAVENOUS
  Administered 2012-02-04: 0.6 mg via INTRAVENOUS
  Administered 2012-02-05: 2.1 mg via INTRAVENOUS
  Administered 2012-02-05: 2.7 mg via INTRAVENOUS
  Administered 2012-02-05: 4.2 mg via INTRAVENOUS
  Administered 2012-02-05: 1.8 mg via INTRAVENOUS
  Administered 2012-02-05: 3.12 mg via INTRAVENOUS
  Administered 2012-02-05: 1.2 mg via INTRAVENOUS
  Administered 2012-02-06: 1.83 mg via INTRAVENOUS
  Administered 2012-02-06: 2.7 mg via INTRAVENOUS
  Administered 2012-02-06: 1.5 mg via INTRAVENOUS
  Administered 2012-02-06 – 2012-02-07 (×3): 2.1 mg via INTRAVENOUS

## 2012-01-25 MED ORDER — ONDANSETRON HCL 4 MG PO TABS: 4.0000 mg | ORAL_TABLET | Freq: Four times a day (QID) | ORAL | Status: DC | PRN

## 2012-01-25 MED ORDER — METHYLPREDNISOLONE SODIUM SUCC 40 MG IJ SOLR
40.0000 mg | INTRAMUSCULAR | Status: DC
  Administered 2012-01-26: 40 mg via INTRAVENOUS
  Filled 2012-01-25 (×2): qty 1

## 2012-01-25 MED ORDER — HYDROMORPHONE HCL PF 1 MG/ML IJ SOLN
INTRAMUSCULAR | Status: AC
  Administered 2012-01-25: 0.5 mg via INTRAVENOUS
  Filled 2012-01-25: qty 1

## 2012-01-25 MED ORDER — HETASTARCH-ELECTROLYTES 6 % IV SOLN
INTRAVENOUS | Status: DC | PRN
  Administered 2012-01-25: 10:00:00 via INTRAVENOUS

## 2012-01-25 MED ORDER — LACTATED RINGERS IV SOLN
INTRAVENOUS | Status: DC
  Administered 2012-01-25: 09:00:00 via INTRAVENOUS

## 2012-01-25 MED ORDER — HYDROMORPHONE HCL PF 1 MG/ML IJ SOLN
0.2500 mg | INTRAMUSCULAR | Status: DC | PRN
  Administered 2012-01-25 (×4): 0.5 mg via INTRAVENOUS

## 2012-01-25 MED ORDER — HYDROMORPHONE 0.3 MG/ML IV SOLN
INTRAVENOUS | Status: AC
  Filled 2012-01-25: qty 25

## 2012-01-25 MED ORDER — SUCCINYLCHOLINE CHLORIDE 20 MG/ML IJ SOLN
INTRAMUSCULAR | Status: DC | PRN
  Administered 2012-01-25: 100 mg via INTRAVENOUS

## 2012-01-25 SURGICAL SUPPLY — 53 items
BANDAGE GAUZE ELAST BULKY 4 IN (GAUZE/BANDAGES/DRESSINGS) ×1 IMPLANT
BLADE SURG ROTATE 9660 (MISCELLANEOUS) ×1 IMPLANT
CANISTER SUCTION 2500CC (MISCELLANEOUS) ×2 IMPLANT
CHLORAPREP W/TINT 26ML (MISCELLANEOUS) ×2 IMPLANT
CLOTH BEACON ORANGE TIMEOUT ST (SAFETY) ×2 IMPLANT
COVER SURGICAL LIGHT HANDLE (MISCELLANEOUS) ×2 IMPLANT
DRAPE LAPAROSCOPIC ABDOMINAL (DRAPES) ×2 IMPLANT
DRAPE UTILITY 15X26 W/TAPE STR (DRAPE) ×4 IMPLANT
DRAPE WARM FLUID 44X44 (DRAPE) ×2 IMPLANT
ELECT BLADE 6.5 EXT (BLADE) ×1 IMPLANT
ELECT CAUTERY BLADE 6.4 (BLADE) ×1 IMPLANT
ELECT REM PT RETURN 9FT ADLT (ELECTROSURGICAL) ×2
ELECTRODE REM PT RTRN 9FT ADLT (ELECTROSURGICAL) ×1 IMPLANT
GLOVE BIO SURGEON STRL SZ7 (GLOVE) ×2 IMPLANT
GLOVE BIO SURGEON STRL SZ7.5 (GLOVE) ×1 IMPLANT
GLOVE BIO SURGEON STRL SZ8 (GLOVE) ×3 IMPLANT
GLOVE BIOGEL PI IND STRL 7.0 (GLOVE) IMPLANT
GLOVE BIOGEL PI IND STRL 7.5 (GLOVE) IMPLANT
GLOVE BIOGEL PI IND STRL 8 (GLOVE) ×1 IMPLANT
GLOVE BIOGEL PI INDICATOR 7.0 (GLOVE) ×2
GLOVE BIOGEL PI INDICATOR 7.5 (GLOVE) ×2
GLOVE BIOGEL PI INDICATOR 8 (GLOVE) ×2
GLOVE ECLIPSE 7.0 STRL STRAW (GLOVE) ×1 IMPLANT
GLOVE SURG SS PI 6.5 STRL IVOR (GLOVE) ×3 IMPLANT
GOWN STRL NON-REIN LRG LVL3 (GOWN DISPOSABLE) ×6 IMPLANT
KIT BASIN OR (CUSTOM PROCEDURE TRAY) ×2 IMPLANT
KIT ROOM TURNOVER OR (KITS) ×2 IMPLANT
LIGASURE IMPACT 36 18CM CVD LR (INSTRUMENTS) ×1 IMPLANT
NS IRRIG 1000ML POUR BTL (IV SOLUTION) ×3 IMPLANT
PACK GENERAL/GYN (CUSTOM PROCEDURE TRAY) ×2 IMPLANT
PAD ARMBOARD 7.5X6 YLW CONV (MISCELLANEOUS) ×4 IMPLANT
RELOAD PROXIMATE 75MM BLUE (ENDOMECHANICALS) ×4 IMPLANT
RELOAD STAPLE 75 3.8 BLU REG (ENDOMECHANICALS) IMPLANT
SPECIMEN JAR LARGE (MISCELLANEOUS) ×1 IMPLANT
SPONGE GAUZE 4X4 12PLY (GAUZE/BANDAGES/DRESSINGS) ×2 IMPLANT
SPONGE LAP 18X18 X RAY DECT (DISPOSABLE) ×1 IMPLANT
STAPLER GUN LINEAR PROX 60 (STAPLE) ×1 IMPLANT
STAPLER PROXIMATE 75MM BLUE (STAPLE) ×1 IMPLANT
STAPLER VISISTAT 35W (STAPLE) ×2 IMPLANT
SUCTION POOLE TIP (SUCTIONS) ×1 IMPLANT
SUT ETHILON 2 0 FS 18 (SUTURE) ×1 IMPLANT
SUT PDS AB 1 CTX 36 (SUTURE) IMPLANT
SUT VIC AB 2-0 SH 18 (SUTURE) ×2 IMPLANT
SUT VIC AB 2-0 SH 27 (SUTURE) ×4
SUT VIC AB 2-0 SH 27X BRD (SUTURE) IMPLANT
SUT VIC AB 3-0 SH 18 (SUTURE) ×4 IMPLANT
SUT VICRYL AB 3 0 TIES (SUTURE) ×2 IMPLANT
TAPE CLOTH SURG 4X10 WHT LF (GAUZE/BANDAGES/DRESSINGS) ×1 IMPLANT
TOWEL OR 17X24 6PK STRL BLUE (TOWEL DISPOSABLE) ×2 IMPLANT
TOWEL OR 17X26 10 PK STRL BLUE (TOWEL DISPOSABLE) ×2 IMPLANT
TRAY FOLEY CATH 14FRSI W/METER (CATHETERS) ×1 IMPLANT
WATER STERILE IRR 1000ML POUR (IV SOLUTION) ×1 IMPLANT
YANKAUER SUCT BULB TIP NO VENT (SUCTIONS) ×1 IMPLANT

## 2012-01-25 NOTE — Op Note (Signed)
Preoperative diagnosis: Small bowel obstruction secondary to Crohn's stricture x2 of terminal ileum  Postoperative diagnosis: Same  Procedure: Exploratory laparotomy with ileocecectomy  Surgeon: Erroll Luna M.D.  Assistant: Donnie Mesa M.D.  Anesthesia: Gen. endotracheal anesthesia  IV fluids 2 L of crystalloid  EBL 100 cc  Specimen: Terminal ileum and cecum  Drains none  Indications for procedure: The patient presents due to small bowel obstruction. 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 He has been managed medically for a number of years. He was admitted with a small bowel structure in a medical management was restarted. They tried to advance his diet but he developed nausea vomiting abdominal pain. After discussion of treatment options, she wished to proceed with surgical resection given he has failed medical management. Risk of bleeding, infection, anastomotic leakage require more surgery, sepsis, wound complications, organ injury, ureteral injury, any injury, DVT, death, are cardial infarction, stroke, and the need for further surgery discussed. He agreed to proceed.  Description of procedure: The patient was met in the holding area and questions were answered. He is taken back to the operating room and placed supine. After induction of general anesthesia, the abdomen was prepped and draped in a sterile fashion. Foley catheter was inserted sterilely. Timeout was done. He received 1 g of Invanz. A midline incision was used. Dissection was carried to the midline fascia this was opened under direct vision. A retractor was placed. The small bowel was run run the ligament of Treitz to the terminal ileum. He had to strictures in the terminal ileum the first involves the terminal ileum and ileocecal valve a second was 10 cm proximal. She had ample small bowel and the remainder of the small bowel was normal.  Stomach and colon were normal. I elected to resect the terminal ileum no strictures. A GIA stapling device was used to divide the small bowel. I mobilized the terminal ileum which was stuck to the right lower quadrant retroperitoneum carefully. The ureter was medial to this and identified. I mobilized the right colon from the white line of Toldt. I then used a LigaSure and 2-0 Vicryl ties to secure the mesentery. This was taken until I reached the proximal cecum which was divided just above the ileocecal valve with a stapler. A side-to-side functional end anastomosis was created using the GIA 75 stapling device and a TA 60. The staple line was oversewn with 3-0 Vicryl. The staple line was hemostatic. I then draped a small piece of pericolonic fat over the staple line and secured this with 3-0 Vicryl. A crotch stitch of 2-0 Vicryl was placed. The lumen was open 2 fingerbreadths in the anastomosis and was under no tension. The blood supply was excellent. Common mesenteric defect closed with 2-0 Vicryl irrigation was used and suctioned out. The small bowel was otherwise normal. The colon and rectum were normal. Stomach was normal. There are no liver masses. Gallbladder grossly normal. The fascia was then closed with #1 PDS. The skin was left open due to chronic steroid use. We was packed with saline soaked gauze. Dry dressings applied. All final counts sponge, needle instruments correct. The patient was extubated taken to recovery in satisfactory condition.

## 2012-01-25 NOTE — Anesthesia Preprocedure Evaluation (Addendum)
Anesthesia Evaluation  Patient identified by MRN, date of birth, ID band Patient awake    Reviewed: Allergy & Precautions, H&P , NPO status , Patient's Chart, lab work & pertinent test results, reviewed documented beta blocker date and time   Airway Mallampati: II  Neck ROM: full    Dental  (+) Teeth Intact and Dental Advisory Given   Pulmonary Current Smoker,          Cardiovascular     Neuro/Psych    GI/Hepatic   Endo/Other  Diabetes mellitus-, Type 2, Oral Hypoglycemic Agents  Renal/GU      Musculoskeletal   Abdominal   Peds  Hematology   Anesthesia Other Findings   Reproductive/Obstetrics                          Anesthesia Physical Anesthesia Plan  ASA: II  Anesthesia Plan: General   Post-op Pain Management:    Induction: Intravenous  Airway Management Planned: Oral ETT  Additional Equipment:   Intra-op Plan:   Post-operative Plan: Extubation in OR  Informed Consent: I have reviewed the patients History and Physical, chart, labs and discussed the procedure including the risks, benefits and alternatives for the proposed anesthesia with the patient or authorized representative who has indicated his/her understanding and acceptance.   Dental advisory given  Plan Discussed with: CRNA and Surgeon  Anesthesia Plan Comments:        Anesthesia Quick Evaluation

## 2012-01-25 NOTE — Interval H&P Note (Signed)
History and Physical Interval Note:  01/25/2012 9:44 AM  Jared [name]  has presented today for surgery, with the diagnosis of CHRONS Disease  The various methods of treatment have been discussed with the patient and family. After consideration of risks, benefits and other options for treatment, the patient has consented to  Procedure(s) (LRB): EXPLORATORY LAPAROTOMY (N/A) SMALL BOWEL RESECTION (N/A) as a surgical intervention .  The patients' history has been reviewed, patient examined, no change in status, stable for surgery.  I have reviewed the patients' chart and labs.  Questions were answered to the patient's satisfaction.     [name]

## 2012-01-25 NOTE — Progress Notes (Signed)
Patient ID: Jared [name], male   DOB: 26-Apr-1966, 47 y.o.   MRN: [id number] Patient heading to surgery this morning. Agree with that plan for treatment of his Crohn's and partial SBO with failure for symptoms to resolve with medical management.

## 2012-01-25 NOTE — Anesthesia Procedure Notes (Signed)
Procedure Name: Intubation Date/Time: 01/25/2012 9:52 AM Performed by: Chales Abrahams Pre-anesthesia Checklist: Patient identified, Timeout performed, Emergency Drugs available, Suction available and Patient being monitored Patient Re-evaluated:Patient Re-evaluated prior to inductionOxygen Delivery Method: Circle system utilized Preoxygenation: Pre-oxygenation with 100% oxygen Intubation Type: IV induction, Rapid sequence and Cricoid Pressure applied Laryngoscope Size: Mac and 3 Grade View: Grade I Tube type: Oral Tube size: 8.0 mm Number of attempts: 1 Airway Equipment and Method: Stylet Placement Confirmation: ETT inserted through vocal cords under direct vision,  breath sounds checked- equal and bilateral and positive ETCO2 Secured at: 23 cm Tube secured with: Tape Dental Injury: Teeth and Oropharynx as per pre-operative assessment

## 2012-01-25 NOTE — Progress Notes (Signed)
Medical Student Daily Progress Note  Subjective: Pt is a 46 yo male with a Hx of Crohn disease and diabetes, who presents with 1 day hx of abdominal pain and distention. CT is consistent with acute Crohn flare up. Pt had 7x flare ups in the past. Problem with med adherence due to socioeconomic reasons. 24 hr events:  - seen by Surg consult: ileocecectomy this am.  - seen by GI consult: steroids iv, f/u abd xray, surg consult  - this am: Pt is already out to surgery at 9:40am. No complaints per surgery note.   Objective: Vital signs in last 24 hours: Filed Vitals:   2012/02/08 1356 February 08, 2012 2130 01/25/12 0524 01/25/12 0742  BP: 157/79 150/85 133/79 158/82  Pulse: 65 57 59 57  Temp: 98.5 F (36.9 C) 98.3 F (36.8 C) 97.7 F (36.5 C) 98.3 F (36.8 C)  TempSrc: Oral Oral    Resp: 18 18 18 20   SpO2: 100% 99% 96% 97%   Weight change:   Intake/Output Summary (Last 24 hours) at 01/25/12 1011 Last data filed at 01/25/12 0900  Gross per 24 hour  Intake   1990 ml  Output    200 ml  Net   1790 ml   Physical Exam:  Pt is out to surgery this am.  Lab Results: Basic Metabolic Panel:  Lab 38/75/64 0515 01/22/12 0630 01/20/12 1146  NA 141 141 --  K 3.7 4.0 --  CL 107 108 --  CO2 23 24 --  GLUCOSE 213* 205* --  BUN 9 9 --  CREATININE 0.78 0.74 --  CALCIUM 8.6 8.3* --  MG -- -- 2.0  PHOS -- -- 2.7   Liver Function Tests:  Lab 01/25/12 0610 01/20/12 0500  AST 13 24  ALT 38 23  ALKPHOS 74 85  BILITOT 0.4 0.3  PROT 6.3 7.4  ALBUMIN 3.0* 3.6    Lab 01/20/12 0500  LIPASE 51  AMYLASE --   No results found for this basename: AMMONIA:2 in the last 168 hours CBC:  Lab 01/22/12 0630 01/21/12 0510 01/20/12 0500  WBC 7.4 10.7* --  NEUTROABS -- -- 5.5  HGB 13.9 15.5 --  HCT 41.0 44.3 --  MCV 83.8 83.3 --  PLT 257 276 --    CBG:  Lab 01/25/12 0830 01/25/12 0623 02-08-12 2206 02/08/12 1603 Feb 08, 2012 1125 08-Feb-2012 0628  GLUCAP 124* 154* 265* 169* 183* 198*   Hemoglobin  A1C:  Lab 01/20/12 1312  HGBA1C 10.3*    Studies/Results: Dg Abd 2 Views  Feb 08, 2012  *RADIOLOGY REPORT*  Clinical Data: Crohn's disease, small bowel obstruction and worsening abdominal distention.  ABDOMEN - 2 VIEW  Comparison: 01/22/2012 and CT from 01/20/2012  Findings: Upright and supine views of the abdomen were obtained. No evidence for free air.  There is gas and contrast within the colon.  There is dilated small bowel in the right abdomen but there appears to be fewer distended small bowel loops. Limited evaluation of the lung bases and difficult to exclude left basilar atelectasis.  IMPRESSION:  There are fewer dilated loops of small bowel in the right abdomen.  Findings are suggestive for improvement in the partial small bowel obstruction.  Original Report Authenticated By: Markus Daft, M.D.   Medications: I have reviewed the patient's current medications. Scheduled Meds:    . chlorhexidine  1 application Topical Once  . docusate sodium  100 mg Oral BID  . enoxaparin  40 mg Subcutaneous Q24H  . ertapenem  1 g  Intravenous 60 min Pre-Op  . insulin aspart  0-5 Units Subcutaneous QHS  . insulin aspart  0-9 Units Subcutaneous TID WC  . insulin glargine  20 Units Subcutaneous QHS  . methylPREDNISolone (SOLU-MEDROL) injection  40 mg Intravenous Q12H  . tuberculin  5 Units Intradermal Once   Continuous Infusions:    . sodium chloride 150 mL/hr at 01/25/12 0500  . lactated ringers 50 mL/hr at 01/25/12 0835   PRN Meds:.hydrALAZINE, ketorolac, ondansetron (ZOFRAN) IV, ondansetron Assessment/Plan:  46 year old man with past medical history of Crohn's disease for past 5 years and diabetes comes to the emergency department with what seems to be his usual Crohn's disease flareup and small bowel obstruction as a complication.  #1 Crohn's disease flareup and SBO  - Pt endorses worsening abd pain and bloating this am. Last stool was Sat 7-8x. - CBC and BMP wnl, stable.  - Surgery today.  F/u after surgery for sx, changes to pain meds, etc  - NPO, ADAT after surgery  - IV solumedrol  - f/u GI outpt after d/c  - will likely require chronic immunomodulator (he was on pentasa before, but had prob w med adherence, dz worsened as a result)  - f/u PPD test  - monitor for passage of stool  - Toradol for pain, f/u after surgery  - Last KUB on Mon shows obstruction is improving.  - C diff test negative  - consider SW/CM involvement due to socioeconomic factors in med adherence.  - consider counselling for smoking cessation.  - IV fluid resuscitation with normal saline   - Place NG tube if patient starts vomiting    - Zofran for nausea and vomiting    #2 DM, type II  - CBG 184 - A1c 10.3, was 8.6 a year ago. - SSI - lantus - will Restart metformin and gliburide at discharge   #3 DVT PPX - Lovenox     LOS: 5 days   This is a Careers information officer Note.  The care of the patient was discussed with Dr. Posey Pronto and the assessment and plan formulated with their assistance.  Please see their attached note for official documentation of the daily encounter.  Anola Gurney 01/25/2012, 10:11 AM  Resident Co-sign Daily Note: I have seen the patient and reviewed the daily progress note by Anola Gurney MS 3 and discussed the care of the patient with them.  See below for documentation of my findings, assessment, and plans.  Subjective:  Patient in no acute distress. Is getting surgery today. Denies any nausea, vomiting, abdominal pain- per discussing with RN.  Objective: Vital signs in last 24 hours: Filed Vitals:   01/24/12 1356 01/24/12 2130 01/25/12 0524 01/25/12 0742  BP: 157/79 150/85 133/79 158/82  Pulse: 65 57 59 57  Temp: 98.5 F (36.9 C) 98.3 F (36.8 C) 97.7 F (36.5 C) 98.3 F (36.8 C)  TempSrc: Oral Oral    Resp: 18 18 18 20   SpO2: 100% 99% 96% 97%   Physical Exam:  General: resting in bed HEENT: PERRL, EOMI, no scleral icterus Cardiac: RRR, no rubs, murmurs or  gallops Pulm: clear to auscultation bilaterally, moving normal volumes of air Abd: soft, nontender, nondistended, BS present Ext: warm and well perfused, no pedal edema Neuro: alert and oriented X3, cranial nerves II-XII grossly intact  Lab Results: Reviewed and documented in Electronic Record Micro Results: Reviewed and documented in Electronic Record Studies/Results: Reviewed and documented in Electronic Record Medications: I have reviewed the patient's current medications.  Scheduled Meds:   . chlorhexidine  1 application Topical Once  . docusate sodium  100 mg Oral BID  . enoxaparin  40 mg Subcutaneous Q24H  . ertapenem  1 g Intravenous 60 min Pre-Op  . insulin aspart  0-5 Units Subcutaneous QHS  . insulin aspart  0-9 Units Subcutaneous TID WC  . insulin glargine  20 Units Subcutaneous QHS  . methylPREDNISolone (SOLU-MEDROL) injection  40 mg Intravenous Q12H  . tuberculin  5 Units Intradermal Once   Continuous Infusions:   . sodium chloride 150 mL/hr at 01/25/12 0500  . lactated ringers 50 mL/hr at 01/25/12 0835   PRN Meds:.0.9 % irrigation (POUR BTL), hydrALAZINE, ketorolac, ondansetron (ZOFRAN) IV, ondansetron Assessment/Plan: Active Problems:  CD flareup (Crohn's disease): Patient getting ileocecectomy this morning. Already in surgery.    DM (diabetes mellitus): Continue Lantus and sliding scale.  Will followup post surgery.   LOS: 5 days   Tyhir Schwan 01/25/2012, 11:40 AM

## 2012-01-25 NOTE — H&P (View-Only) (Signed)
Day of Surgery  Subjective: No COMPLAINTS  Objective: Vital signs in last 24 hours: Temp:  [97.7 F (36.5 C)-98.5 F (36.9 C)] 98.3 F (36.8 C) (03/12 0742) Pulse Rate:  [57-65] 57  (03/12 0742) Resp:  [18-20] 20  (03/12 0742) BP: (133-170)/(79-91) 158/82 mmHg (03/12 0742) SpO2:  [94 %-100 %] 97 % (03/12 0742) Last BM Date: 01/23/12  Intake/Output from previous day: 02/08/23 0701 - 03/12 0700 In: 2230 [P.O.:840; I.V.:1390] Out: 200 [Urine:200] Intake/Output this shift:    GI: SLIGHT DISTENSION AND rlq TENTERNESS  Lab Results:  No results found for this basename: WBC:2,HGB:2,HCT:2,PLT:2 in the last 72 hours BMET  [facility] 08-Feb-2012 0515  NA 141  K 3.7  CL 107  CO2 23  GLUCOSE 213*  BUN 9  CREATININE 0.78  CALCIUM 8.6   PT/INR No results found for this basename: LABPROT:2,INR:2 in the last 72 hours ABG No results found for this basename: PHART:2,PCO2:2,PO2:2,HCO3:2 in the last 72 hours  Studies/Results: Dg Abd 2 Views  02-08-12  *RADIOLOGY REPORT*  Clinical Data: Crohn's disease, small bowel obstruction and worsening abdominal distention.  ABDOMEN - 2 VIEW  Comparison: 01/22/2012 and CT from 01/20/2012  Findings: Upright and supine views of the abdomen were obtained. No evidence for free air.  There is gas and contrast within the colon.  There is dilated small bowel in the right abdomen but there appears to be fewer distended small bowel loops. Limited evaluation of the lung bases and difficult to exclude left basilar atelectasis.  IMPRESSION:  There are fewer dilated loops of small bowel in the right abdomen.  Findings are suggestive for improvement in the partial small bowel obstruction.  Original Report Authenticated By: Markus Daft, M.D.    Anti-infectives: Anti-infectives     Start     Dose/Rate Route Frequency Ordered Stop   February 08, 2012 1722   ertapenem (INVANZ) 1 g in sodium chloride 0.9 % 50 mL IVPB        1 g 100 mL/hr over 30 Minutes Intravenous 60 min pre-op  Feb 08, 2012 1722            Assessment/Plan: s/p Procedure(s) (LRB): EXPLORATORY LAPAROTOMY (N/A) SMALL BOWEL RESECTION (N/A) Small bowel obstruction chronic Recommend exploratory laparotomy for chronic small bowel obstruction.  Risks include bleeding,  Infection,  Poor wound healing,  anastamotic leak and sepsis,  CVA ,MI,  Death,  Hernia .  Alternatives include medical management.  He wishes to proceed with surgery.  LOS: 5 days    Jared Mano A. 01/25/2012

## 2012-01-25 NOTE — Transfer of Care (Signed)
Immediate Anesthesia Transfer of Care Note  Patient: Jared [name]  Procedure(s) Performed: Procedure(s) (LRB): EXPLORATORY LAPAROTOMY (N/A) SMALL BOWEL RESECTION (N/A)  Patient Location: PACU  Anesthesia Type: General  Level of Consciousness: awake and alert   Airway & Oxygen Therapy: Patient Spontanous Breathing and Patient connected to face mask oxygen  Post-op Assessment: Report given to PACU RN and Post -op Vital signs reviewed and stable  Post vital signs: Reviewed  Complications: No apparent anesthesia complications

## 2012-01-25 NOTE — Progress Notes (Signed)
Day of Surgery  Subjective: No COMPLAINTS  Objective: Vital signs in last 24 hours: Temp:  [97.7 F (36.5 C)-98.5 F (36.9 C)] 98.3 F (36.8 C) (03/12 0742) Pulse Rate:  [57-65] 57  (03/12 0742) Resp:  [18-20] 20  (03/12 0742) BP: (133-170)/(79-91) 158/82 mmHg (03/12 0742) SpO2:  [94 %-100 %] 97 % (03/12 0742) Last BM Date: 01/23/12  Intake/Output from previous day: February 04, 2023 0701 - 03/12 0700 In: 2230 [P.O.:840; I.V.:1390] Out: 200 [Urine:200] Intake/Output this shift:    GI: SLIGHT DISTENSION AND rlq TENTERNESS  Lab Results:  No results found for this basename: WBC:2,HGB:2,HCT:2,PLT:2 in the last 72 hours BMET  [facility] 02-04-12 0515  NA 141  K 3.7  CL 107  CO2 23  GLUCOSE 213*  BUN 9  CREATININE 0.78  CALCIUM 8.6   PT/INR No results found for this basename: LABPROT:2,INR:2 in the last 72 hours ABG No results found for this basename: PHART:2,PCO2:2,PO2:2,HCO3:2 in the last 72 hours  Studies/Results: Dg Abd 2 Views  02-04-12  *RADIOLOGY REPORT*  Clinical Data: Crohn's disease, small bowel obstruction and worsening abdominal distention.  ABDOMEN - 2 VIEW  Comparison: 01/22/2012 and CT from 01/20/2012  Findings: Upright and supine views of the abdomen were obtained. No evidence for free air.  There is gas and contrast within the colon.  There is dilated small bowel in the right abdomen but there appears to be fewer distended small bowel loops. Limited evaluation of the lung bases and difficult to exclude left basilar atelectasis.  IMPRESSION:  There are fewer dilated loops of small bowel in the right abdomen.  Findings are suggestive for improvement in the partial small bowel obstruction.  Original Report Authenticated By: Markus Daft, M.D.    Anti-infectives: Anti-infectives     Start     Dose/Rate Route Frequency Ordered Stop   Feb 04, 2012 1722   ertapenem (INVANZ) 1 g in sodium chloride 0.9 % 50 mL IVPB        1 g 100 mL/hr over 30 Minutes Intravenous 60 min pre-op  February 04, 2012 1722            Assessment/Plan: s/p Procedure(s) (LRB): EXPLORATORY LAPAROTOMY (N/A) SMALL BOWEL RESECTION (N/A) Small bowel obstruction chronic Recommend exploratory laparotomy for chronic small bowel obstruction.  Risks include bleeding,  Infection,  Poor wound healing,  anastamotic leak and sepsis,  CVA ,MI,  Death,  Hernia .  Alternatives include medical management.  He wishes to proceed with surgery.  LOS: 5 days    Doctor Sheahan A. 01/25/2012

## 2012-01-25 NOTE — Preoperative (Signed)
Beta Blockers   Reason not to administer Beta Blockers:Not Applicable. [place]

## 2012-01-25 NOTE — Anesthesia Postprocedure Evaluation (Signed)
Anesthesia Post Note  Patient: Jared [name]  Procedure(s) Performed: Procedure(s) (LRB): EXPLORATORY LAPAROTOMY (N/A) SMALL BOWEL RESECTION (N/A)  Anesthesia type: General  Patient location: PACU  Post pain: Pain level controlled and Adequate analgesia  Post assessment: Post-op Vital signs reviewed, Patient's Cardiovascular Status Stable, Respiratory Function Stable, Patent Airway and Pain level controlled  Last Vitals:  Filed Vitals:   01/25/12 1245  BP: 148/82  Pulse: 87  Temp:   Resp: 22    Post vital signs: Reviewed and stable  Level of consciousness: awake, alert  and oriented  Complications: No apparent anesthesia complications

## 2012-01-26 LAB — GLUCOSE, CAPILLARY
Glucose-Capillary: 209 mg/dL — ABNORMAL HIGH (ref 70–99)
Glucose-Capillary: 227 mg/dL — ABNORMAL HIGH (ref 70–99)
Glucose-Capillary: 236 mg/dL — ABNORMAL HIGH (ref 70–99)
Glucose-Capillary: 236 mg/dL — ABNORMAL HIGH (ref 70–99)
Glucose-Capillary: 240 mg/dL — ABNORMAL HIGH (ref 70–99)
Glucose-Capillary: 268 mg/dL — ABNORMAL HIGH (ref 70–99)

## 2012-01-26 LAB — BASIC METABOLIC PANEL
BUN: 7 mg/dL (ref 6–23)
CO2: 27 mEq/L (ref 19–32)
Calcium: 7.7 mg/dL — ABNORMAL LOW (ref 8.4–10.5)
Chloride: 100 mEq/L (ref 96–112)
Creatinine, Ser: 0.85 mg/dL (ref 0.50–1.35)
GFR calc Af Amer: >90 mL/min (ref >90)
GFR calc non Af Amer: >90 mL/min (ref >90)
Glucose, Bld: 234 mg/dL — ABNORMAL HIGH (ref 70–99)
Potassium: 3.2 mEq/L — ABNORMAL LOW (ref 3.5–5.1)
Sodium: 135 mEq/L (ref 135–145)

## 2012-01-26 LAB — CBC
HCT: 43.3 % (ref 39.0–52.0)
Hemoglobin: 14.6 g/dL (ref 13.0–17.0)
MCH: 28.4 pg (ref 26.0–34.0)
MCHC: 33.7 g/dL (ref 30.0–36.0)
MCV: 84.2 fL (ref 78.0–100.0)
Platelets: 292 10*3/uL (ref 150–400)
RBC: 5.14 MIL/uL (ref 4.22–5.81)
RDW: 13.9 % (ref 11.5–15.5)
WBC: 11.1 10*3/uL — ABNORMAL HIGH (ref 4.0–10.5)

## 2012-01-26 LAB — MAGNESIUM: Magnesium: 1.6 mg/dL (ref 1.5–2.5)

## 2012-01-26 MED ORDER — HYDROCORTISONE SOD SUCCINATE 100 MG IJ SOLR
50.0000 mg | Freq: Every day | INTRAMUSCULAR | Status: AC
  Administered 2012-01-27: 50 mg via INTRAVENOUS
  Filled 2012-01-26: qty 1

## 2012-01-26 MED ORDER — HYDROMORPHONE 0.3 MG/ML IV SOLN
INTRAVENOUS | Status: AC
  Filled 2012-01-26: qty 25

## 2012-01-26 MED ORDER — POTASSIUM CHLORIDE 10 MEQ/100ML IV SOLN
10.0000 meq | INTRAVENOUS | Status: AC
  Administered 2012-01-26 (×4): 10 meq via INTRAVENOUS
  Filled 2012-01-26 (×4): qty 100

## 2012-01-26 NOTE — Progress Notes (Signed)
Patient ID: Jared [name], male   DOB: [birth year], 46 y.o.   MRN: [id number] 1 Day Post-Op  Subjective: Pt doesn't feel well this morning.  Having abdominal pain.  No flatus.  Objective: Vital signs in last 24 hours: Temp:  [98 F (36.7 C)-100.2 F (37.9 C)] 99.9 F (37.7 C) (03/13 0544) Pulse Rate:  [87-116] 116  (03/13 0544) Resp:  [15-96] 27  (03/13 0900) BP: (122-160)/(58-88) 126/80 mmHg (03/13 0544) SpO2:  [91 %-100 %] 96 % (03/13 0900) Last BM Date: 01/23/12  Intake/Output from previous day: 03/12 0701 - 03/13 0700 In: 4235 [I.V.:3735; IV Piggyback:500] Out: 1390 [Urine:1375; Emesis/NG output:15] Intake/Output this shift:    PE: Abd: soft, mildly distended, -BS, quite tender, incision is clean.  NGT has minimal output. Heart: regular Lungs: CTAB  Lab Results:   Basename 01/26/12 0500  WBC 11.1*  HGB 14.6  HCT 43.3  PLT 292   BMET  Basename 01/26/12 0500 February 12, 2012 0515  NA 135 141  K 3.2* 3.7  CL 100 107  CO2 27 23  GLUCOSE 234* 213*  BUN 7 9  CREATININE 0.85 0.78  CALCIUM 7.7* 8.6   PT/INR No results found for this basename: LABPROT:2,INR:2 in the last 72 hours   Studies/Results: Dg Abd 2 Views  February 12, 2012  *RADIOLOGY REPORT*  Clinical Data: Crohn's disease, small bowel obstruction and worsening abdominal distention.  ABDOMEN - 2 VIEW  Comparison: 01/22/2012 and CT from 01/20/2012  Findings: Upright and supine views of the abdomen were obtained. No evidence for free air.  There is gas and contrast within the colon.  There is dilated small bowel in the right abdomen but there appears to be fewer distended small bowel loops. Limited evaluation of the lung bases and difficult to exclude left basilar atelectasis.  IMPRESSION:  There are fewer dilated loops of small bowel in the right abdomen.  Findings are suggestive for improvement in the partial small bowel obstruction.  Original Report Authenticated By: Markus Daft, M.D.    Anti-infectives: Anti-infectives       Start     Dose/Rate Route Frequency Ordered Stop   2012-02-12 1722   ertapenem (INVANZ) 1 g in sodium chloride 0.9 % 50 mL IVPB        1 g 100 mL/hr over 30 Minutes Intravenous 60 min pre-op 02/12/12 1722 01/25/12 0948           Assessment/Plan  1. Crohn's stricture x2, s/p ex lap with SBR 2. Post op ileus  Plan: 1. Cont bowel rest and NGT for now. 2. OOB to chair today 3. Foley has been discontinued.  4. Pt is having quite a bit of abdominal pain this morning, suspect he is sensitive to pain and will watch closely.  He does not appear septic or as if he has any complications.   LOS: 6 days    Allexis Bordenave E 01/26/2012

## 2012-01-26 NOTE — Progress Notes (Signed)
IM Attending Note          Patient seen and examined with housestaff team. He has expected ileus after resection and so far is stable. It will be several days for recovery of peristalsis. Will turn over fluid management to surgery until advised other wise since multiple prescribers has not been helpful in my experience. Lars Mage

## 2012-01-26 NOTE — Progress Notes (Signed)
Pt's PPD test was read. 67m induration noted. Test result is negative.

## 2012-01-26 NOTE — Progress Notes (Signed)
Medical Student Daily Progress Note  Subjective: Pt is a 46 yo male with a Hx of Crohn disease and diabetes, who presents with 1 day hx of abdominal pain and distention. CT is consistent with acute Crohn flare up. Pt had 7x flare ups in the past. Problem with med adherence due to socioeconomic reasons. 24 hr events:   - s/p ileocecectomy [name]/12.  - temperature 100.2 -> 99.9 this am.  - having pain. On PCA dilaudid.  - endorses continued distention.  - NG tube in.  - diet: ice chips.  - foley out POD1 am, V/S  - have not had chance to ambulate after surgery.  - have not passed after surgery.  - denies CP, endorses some SOB when swallowing.     Objective: Vital signs in last 24 hours: Filed Vitals:   01/26/12 0205 01/26/12 0400 01/26/12 0544 01/26/12 0900  BP: 124/73  126/80   Pulse: 106  116   Temp: 100.2 F (37.9 C)  99.9 F (37.7 C)   TempSrc:      Resp: 18 96 20 27  SpO2: 97%  95% 96%   Weight change:   Intake/Output Summary (Last 24 hours) at 01/26/12 1210 Last data filed at 01/26/12 0600  Gross per 24 hour  Intake   1435 ml  Output   1015 ml  Net    420 ml   Physical Exam:  Vitals reviewed. General: resting in bed, looks uncomfortable, but NAD. NG tube in. Cardiac: RRR, no rubs, murmurs or gallops Pulm: clear to auscultation bilaterally, no wheezes, rales, or rhonchi Abd: BS not heard, very tender, mildly distended. Unable to palpate deeply due to pain. Dressing over midline incision intact, no leakage. Ext: warm and well perfused, no pedal edema, no tenderness, SCDs in place.   Lab Results: Basic Metabolic Panel:  Lab 36/14/43 0500 01/24/12 0515 01/20/12 1146  Jared [name] 135 141 --  K [name].2* [name].7 --  CL 100 107 --  CO2 27 23 --  GLUCOSE 234* 213* --  BUN 7 9 --  CREATININE 0.85 0.78 --  CALCIUM 7.7* 8.6 --  MG 1.6 -- 2.0  PHOS -- -- 2.7   Liver Function Tests:  Lab 01/25/12 0610 01/20/12 0500  AST 13 24  ALT 38 23  ALKPHOS 74 85  BILITOT 0.4 0.[name]  PROT  6.[name] 7.4  ALBUMIN [name].0* [name].6    Lab 01/20/12 0500  LIPASE 51  AMYLASE --   No results found for this basename: AMMONIA:2 in the last 168 hours CBC:  Lab 01/26/12 0500 01/22/12 0630 01/20/12 0500  WBC 11.1* 7.4 --  NEUTROABS -- -- 5.5  HGB 14.6 13.9 --  HCT 43.[name] 41.0 --  MCV 84.2 83.8 --  PLT 292 257 --    CBG:  Lab 01/26/12 1138 01/26/12 0626 01/26/12 0400 01/26/12 01/25/12 2243 01/25/12 2001  GLUCAP 268* 240* 236* 209* 213* 187*   Hemoglobin A1C:  Lab 01/20/12 1312  HGBA1C 10.[name]*    Studies/Results: No results found. Medications: I have reviewed the patient's current medications. Scheduled Meds:    . enoxaparin  40 mg Subcutaneous Q24H  . HYDROmorphone PCA 0.[name] mg/mL   Intravenous Q4H  . HYDROmorphone PCA 0.[name] mg/mL      . insulin aspart  0-5 Units Subcutaneous QHS  . insulin aspart  0-9 Units Subcutaneous TID WC  . ketorolac      . methylPREDNISolone (SOLU-MEDROL) injection  40 mg Intravenous Q24H  . potassium chloride  10 mEq Intravenous  Q1 Hr x 4  . DISCONTD: docusate sodium  100 mg Oral BID  . DISCONTD: enoxaparin  40 mg Subcutaneous Q24H  . DISCONTD: insulin glargine  20 Units Subcutaneous QHS  . DISCONTD: methylPREDNISolone (SOLU-MEDROL) injection  40 mg Intravenous Q12H   Continuous Infusions:    . dextrose 5 % and 0.45 % NaCl with KCl 20 mEq/L 100 mL/hr at 01/26/12 0241  . DISCONTD: sodium chloride 150 mL/hr at 01/25/12 0500  . DISCONTD: lactated ringers 50 mL/hr at 01/25/12 0835   PRN Meds:.diphenhydrAMINE, diphenhydrAMINE, hydrALAZINE, ketorolac, naloxone, ondansetron (ZOFRAN) IV, ondansetron, sodium chloride, DISCONTD: 0.9 % irrigation (POUR BTL), DISCONTD: HYDROmorphone, DISCONTD: ketorolac, DISCONTD: ondansetron (ZOFRAN) IV, DISCONTD: ondansetron (ZOFRAN) IV, DISCONTD: ondansetron (ZOFRAN) IV, DISCONTD: ondansetron Assessment/Plan:  46 year old man with past medical history of Crohn's disease for past 5 years and diabetes comes to the emergency  department with what seems to be his usual Crohn's disease flareup and small bowel obstruction as a complication.  #1 Crohn's disease flareup and SBO  - Pt s/p ileocecectomy POD1. Endorses pain and continued distention. - H 116, R 27 - CBC: WBC 11.1, H/H normal. BMP: [name] 135, K [name].2, Mg 1.6 (lower end of normal), BUN 7, Crt 0.85. Low lytes possibly due to NG suction or dilution effect.  - Pain despite PCA dilaudid, consider tylenol prn.  - Tolerating ice chips, ADAT  - monitor for passage of gas and stool  - NG tube in. Monitor lytes.  - Hypokalemia, KCl x4 ordered. Replete prn.  - Mg is 1.6 lower end of normal. Cont monitor, replete prn.  - encourage IS, OOB.  - IV solumedrol  - f/u GI outpt after d/c  - will likely require chronic immunomodulator (he was on pentasa before, but had prob w med adherence, dz worsened as a result)  - f/u PPD test  - f/u Path images for surg specimen.  - Toradol for pain, f/u after surgery  - Last KUB on Mon shows obstruction is improving.  - C diff test negative  - consider SW/CM involvement due to socioeconomic factors in med adherence.  - consider counselling for smoking cessation.  - IV fluid resuscitation with normal saline   - Zofran for nausea and vomiting    #2 DM, type II  - CBG 268 - A1c 10.[name], was 8.6 a year ago. - Novolog - lantus - will Restart metformin and gliburide at discharge   #[name] DVT PPX - Lovenox, SCDs     LOS: 6 days   This is a Careers information officer Note.  The care of the patient was discussed with Dr. Nicoletta Dress and the assessment and plan formulated with their assistance.  Please see their attached note for official documentation of the daily encounter.  [name]/13/2013, 12:10 PM  Resident Co-sign Daily Note: I have seen the patient and reviewed the daily progress note by Jared [name] and discussed the care of the patient with them.  See below for documentation of my findings, assessment, and plans.  Subjective: Post op Day  #1 Still c/o abd pain, which is relatively controlled with PCA dilaudid.  Objective: Vital signs in last 24 hours: Filed Vitals:   01/26/12 0544 01/26/12 0900 01/26/12 1250 01/26/12 1500  BP: 126/80   137/77  Pulse: 116   108  Temp: 99.9 F (37.7 C)   97.6 F (36.4 C)  TempSrc:      Resp: 20 27 14 17   SpO2: 95% 96% 96% 94%   Physical Exam:  General: resting in bed, looks uncomfortable, but NAD. NG tube in.  Cardiac: RRR, no rubs, murmurs or gallops  Pulm: clear to auscultation bilaterally, no wheezes, rales, or rhonchi  Abd: BS not heard, very tender, mildly distended.  Dressing over midline incision intact, no leakage.  Ext: warm and well perfused, no pedal edema, no tenderness, SCDs in place.   Lab Results: Reviewed and documented in Electronic Record Micro Results: Reviewed and documented in Electronic Record Studies/Results: Reviewed and documented in Electronic Record Medications: I have reviewed the patient's current medications. Scheduled Meds:   . enoxaparin  40 mg Subcutaneous Q24H  . hydrocortisone sod succinate (SOLU-CORTEF) injection  50 mg Intravenous Daily  . HYDROmorphone PCA 0.[name] mg/mL   Intravenous Q4H  . HYDROmorphone PCA 0.[name] mg/mL      . HYDROmorphone PCA 0.[name] mg/mL      . insulin aspart  0-5 Units Subcutaneous QHS  . insulin aspart  0-9 Units Subcutaneous TID WC  . ketorolac      . potassium chloride  10 mEq Intravenous Q1 Hr x 4  . DISCONTD: methylPREDNISolone (SOLU-MEDROL) injection  40 mg Intravenous Q24H   Continuous Infusions:   . dextrose 5 % and 0.45 % NaCl with KCl 20 mEq/L 100 mL/hr at 01/26/12 1248   PRN Meds:.diphenhydrAMINE, diphenhydrAMINE, hydrALAZINE, ketorolac, naloxone, ondansetron (ZOFRAN) IV, ondansetron, sodium chloride, DISCONTD: HYDROmorphone, DISCONTD: ondansetron (ZOFRAN) IV Assessment/Plan:   # CD flareup (Crohn's disease): s/p ileocecectomy day one. Pain control and Surgical service follow.  # DM (diabetes mellitus):  Continue Lantus and sliding scale.       LOS: 6 days   Marvelle Caudill [name]/13/2013, 4:23 PM

## 2012-01-26 NOTE — Progress Notes (Signed)
Patient ID: Jared [name], male   DOB: June 10, 1966, 46 y.o.   MRN: [id number]  S/P Ileocecectomy yesterday for his Crohn's and pSBO. Postop care with bowel rest and Dilaudid PCA by surgery. Pt. Reports ice chips overnight. Role of an immunomodulator like azathioprine will need to be determined by his GI doc as outpt and will depend on his compliance with regular lab checks and follow ups etc. No new recs. Call us if questions.

## 2012-01-26 NOTE — Progress Notes (Signed)
Stable.  oob will cont to wean steroids.

## 2012-01-26 NOTE — Progress Notes (Signed)
Inpatient Diabetes Recommendations  Results for Jared [name], Jared [name] (MRN [id number]) as of 01/26/2012 11:30  Ref. Range 01/25/2012 08:30 01/25/2012 12:16 01/25/2012 16:02 01/25/2012 20:01 01/25/2012 22:43 01/26/2012 00:00 01/26/2012 04:00 01/26/2012 06:26  Glucose-Capillary Latest Range: 70-99 mg/dL 124 (H) 140 (H) 141 (H) 187 (H) 213 (H) 209 (H) 236 (H) 240 (H)    Pt will likely need basal insulin even though NPO.  Recommend Lantus 15 units QHS.  Will follow.

## 2012-01-27 ENCOUNTER — Telehealth (INDEPENDENT_AMBULATORY_CARE_PROVIDER_SITE_OTHER): Payer: Self-pay | Admitting: General Surgery

## 2012-01-27 LAB — GLUCOSE, CAPILLARY
Glucose-Capillary: 239 mg/dL — ABNORMAL HIGH (ref 70–99)
Glucose-Capillary: 231 mg/dL — ABNORMAL HIGH (ref 70–99)
Glucose-Capillary: 238 mg/dL — ABNORMAL HIGH (ref 70–99)
Glucose-Capillary: 194 mg/dL — ABNORMAL HIGH (ref 70–99)

## 2012-01-27 LAB — BASIC METABOLIC PANEL
BUN: 7 mg/dL (ref 6–23)
CO2: 29 mEq/L (ref 19–32)
Calcium: 8.8 mg/dL (ref 8.4–10.5)
Chloride: 97 mEq/L (ref 96–112)
Creatinine, Ser: 0.8 mg/dL (ref 0.50–1.35)
GFR calc Af Amer: >90 mL/min (ref >90)
GFR calc non Af Amer: >90 mL/min (ref >90)
Glucose, Bld: 222 mg/dL — ABNORMAL HIGH (ref 70–99)
Potassium: 3.6 mEq/L (ref 3.5–5.1)
Sodium: 136 mEq/L (ref 135–145)

## 2012-01-27 LAB — MAGNESIUM: Magnesium: 2.2 mg/dL (ref 1.5–2.5)

## 2012-01-27 MED ORDER — PHENOL 1.4 % MT LIQD
1.0000 | OROMUCOSAL | Status: DC | PRN
  Filled 2012-01-27: qty 177

## 2012-01-27 MED ORDER — HYDROMORPHONE 0.3 MG/ML IV SOLN
INTRAVENOUS | Status: AC
  Filled 2012-01-27: qty 25

## 2012-01-27 MED ORDER — MENTHOL 3 MG MT LOZG
1.0000 | LOZENGE | OROMUCOSAL | Status: DC | PRN
  Filled 2012-01-27 (×2): qty 9

## 2012-01-27 NOTE — Telephone Encounter (Signed)
Calling back to report she re-listened to phone message about NO SHOW for appt 01/17/12.  Since then Jared [name] has had surgery (Dr. Brantley Stage.)

## 2012-01-27 NOTE — Progress Notes (Signed)
Patient ID: Jared [name], male   DOB: Nov 08, 1966, 46 y.o.   MRN: [id number] 2 Days Post-Op  Subjective: Pt still c/o some pain, but feeling a little better.  Had over 800cc of NGT output overnight.  No flatus. Objective: Vital signs in last 24 hours: Temp:  [97.5 F (36.4 C)-97.7 F (36.5 C)] 97.5 F (36.4 C) (03/14 1093) Pulse Rate:  [98-116] 98  (03/14 0633) Resp:  [14-23] 16  (03/14 0900) BP: (130-151)/(72-77) 130/74 mmHg (03/14 0633) SpO2:  [94 %-96 %] 95 % (03/14 0900) FiO2 (%):  [32 %-36 %] 34 % (03/14 0900) Last BM Date: 01/23/12  Intake/Output from previous day: 03/13 0701 - 03/14 0700 In: 20 [NG/GT:20] Out: 2370 [Urine:2350; Emesis/NG output:20] Intake/Output this shift:    PE: Abd: soft, -BS, still quite tender, distended still.  NGT with bilious output.  Lab Results:   Basename 01/26/12 0500  WBC 11.1*  HGB 14.6  HCT 43.3  PLT 292   BMET  Basename 01/26/12 0500  NA 135  K 3.2*  CL 100  CO2 27  GLUCOSE 234*  BUN 7  CREATININE 0.85  CALCIUM 7.7*   PT/INR No results found for this basename: LABPROT:2,INR:2 in the last 72 hours   Studies/Results: No results found.  Anti-infectives: Anti-infectives     Start     Dose/Rate Route Frequency Ordered Stop   01/24/12 1722   ertapenem (INVANZ) 1 g in sodium chloride 0.9 % 50 mL IVPB        1 g 100 mL/hr over 30 Minutes Intravenous 60 min pre-op 01/24/12 1722 01/25/12 0948           Assessment/Plan  1. S/p ex lap with SBR 2. Post op ileus  Plan: 1. Cont NGT as after flushing, he started having a good deal of output. 2. Pt needs to get OOB today and ambulate. 3. Await bowel function. 4. D/c steroids after today.   LOS: 7 days    Dailey Buccheri E 01/27/2012

## 2012-01-27 NOTE — Progress Notes (Signed)
Medical Student Daily Progress Note  Subjective: Pt is a 46 yo male with a Hx of Crohn disease and diabetes, who presents with 1 day hx of abdominal pain and distention. CT is consistent with acute Crohn flare up. Pt had 7x flare ups in the past. Problem with med adherence due to socioeconomic reasons. 24 hr events:   - s/p ileocecectomy 3/12.  - temperature 100.2 -> 99.9 -> 97.5.  - pain partially controlled by PCA dilaudid. But has a very good output from his NG tube suction.             - have not passed gas after surgery (endorses "almost" passed gas)   - diet: ice chips, no n/v.  - foley out POD1 am, V/S  - have not had chance to ambulate after surgery..  - denies CP, SOB.     Objective: Vital signs in last 24 hours: Filed Vitals:   01/27/12 0607 01/27/12 0633 01/27/12 0900 01/27/12 1210  BP:  130/74    Pulse:  98    Temp:  97.5 F (36.4 C)    TempSrc:      Resp: 23 20 16 19   SpO2: 96% 95% 95% 92%   Weight change:   Intake/Output Summary (Last 24 hours) at 01/27/12 1324 Last data filed at 01/27/12 0636  Gross per 24 hour  Intake      0 ml  Output   2350 ml  Net  -2350 ml   Physical Exam:  Vitals reviewed. General: resting in bed, looks uncomfortable, but NAD. NG tube in. HEENT: NG in place. Brown drainage collected. Cardiac: RRR, no rubs, murmurs or gallops Pulm: clear to auscultation bilaterally, no wheezes, rales, or rhonchi Abd: BS not heard, mild tenderness noted.  mildly distended. Unable to palpate deeply due to pain. Dressing over midline incision intact, no leakage. No rebound. Ext: warm and well perfused, no pedal edema, no tenderness, SCDs in place.   Lab Results: Basic Metabolic Panel:  Lab 51/88/41 1157 01/26/12 0500  Chyanne Kohut 136 135  K 3.6 3.2*  CL 97 100  CO2 29 27  GLUCOSE 222* 234*  BUN 7 7  CREATININE 0.80 0.85  CALCIUM 8.8 7.7*  MG 2.2 1.6  PHOS -- --   Liver Function Tests:  Lab 01/25/12 0610  AST 13  ALT 38  ALKPHOS 74  BILITOT  0.4  PROT 6.3  ALBUMIN 3.0*   CBC:  Lab 01/26/12 0500 01/22/12 0630  WBC 11.1* 7.4  NEUTROABS -- --  HGB 14.6 13.9  HCT 43.3 41.0  MCV 84.2 83.8  PLT 292 257    CBG:  Lab 01/27/12 1110 01/27/12 0631 01/26/12 2114 01/26/12 1642 01/26/12 1138 01/26/12 0626  GLUCAP 231* 239* 227* 236* 268* 240*   Hemoglobin A1C: No results found for this basename: HGBA1C in the last 168 hours  Studies/Results: No results found. Medications: I have reviewed the patient's current medications. Scheduled Meds:    . enoxaparin  40 mg Subcutaneous Q24H  . hydrocortisone sod succinate (SOLU-CORTEF) injection  50 mg Intravenous Daily  . HYDROmorphone PCA 0.3 mg/mL   Intravenous Q4H  . insulin aspart  0-5 Units Subcutaneous QHS  . insulin aspart  0-9 Units Subcutaneous TID WC  . potassium chloride  10 mEq Intravenous Q1 Hr x 4  . DISCONTD: methylPREDNISolone (SOLU-MEDROL) injection  40 mg Intravenous Q24H   Continuous Infusions:    . dextrose 5 % and 0.45 % NaCl with KCl 20 mEq/L 1,000 mL (01/26/12  2310)   PRN Meds:.diphenhydrAMINE, diphenhydrAMINE, hydrALAZINE, ketorolac, menthol-cetylpyridinium, naloxone, ondansetron (ZOFRAN) IV, ondansetron, phenol, sodium chloride Assessment/Plan:  46 year old man with past medical history of Crohn's disease for past 5 years and diabetes comes to the emergency department with what seems to be his usual Crohn's disease flareup and small bowel obstruction as a complication.  #1 Crohn's disease flareup and SBO  - Pt s/p ileocecectomy POD1. Endorses pain and continued distention. - H 98, R 16, afebrile - CBC: WBC 11.1, H/H normal. BMP: Britiney Blahnik 135, K 3.2, Mg 1.6 (lower end of normal), BUN 7, Crt 0.85. Low lytes possibly due to NG suction or dilution effect.  - continue PCA dilaudid.   - throat lozenges for discomfort 2/2 NG.  - Tolerating ice chips, ADAT  - monitor for passage of gas and stool (none yet)  - NG tube in. Monitor lytes.  - F/u Hypokalemia, Labs  N/A this am. Yesterday KCl x4 ordered. Replete prn.  - F/u Mg. Was 1.6 yesterday. Cont monitor, replete prn.  - encourage IS, OOB.  - IV solumedrol. Dr Maxwell Caul recs d/c steroids after today.  - f/u GI outpt after d/c  - will likely require chronic immunomodulator (he was on pentasa before, but had prob w med adherence, dz worsened as a result)  - PPD negative  - Toradol for pain  - Last KUB on Mon shows obstruction is improving.  - C diff test negative  - consider SW/CM involvement due to socioeconomic factors in med adherence.  - consider counselling for smoking cessation.  - IV fluid resuscitation with normal saline   - Zofran for nausea and vomiting    #2 DM, type II  - CBG 239 - A1c 10.3, was 8.6 a year ago. - Novolog - lantus - will Restart metformin and gliburide at discharge   #3 DVT PPX - Lovenox, SCDs     LOS: 7 days   This is a Careers information officer Note.  The care of the patient was discussed with Dr. Nicoletta Dress and the assessment and plan formulated with their assistance.  Please see their attached note for official documentation of the daily encounter. Resident Co-sign Daily Note: I have seen the patient and reviewed the daily progress note by Nicoletta Dress MS 3 and discussed the care of the patient with them.  See below for documentation of my findings, assessment, and plans.  Subjective: Post op Day #2  Still c/o mild abd pain, which is relatively controlled with PCA dilaudid  Objective: Vital signs in last 24 hours: Filed Vitals:   01/27/12 0607 01/27/12 0633 01/27/12 0900 01/27/12 1210  BP:  130/74    Pulse:  98    Temp:  97.5 F (36.4 C)    TempSrc:      Resp: 23 20 16 19   SpO2: 96% 95% 95% 92%   Physical Exam: General: resting in bed, looks uncomfortable, but NAD. NG tube in.  Cardiac: RRR, no rubs, murmurs or gallops  Pulm: clear to auscultation bilaterally, no wheezes, rales, or rhonchi  Abd: BS not heard, mild tender, mildly distended. Dressing over midline incision  intact, no leakage.  Ext: warm and well perfused, no pedal edema, no tenderness, SCDs in place.   Lab Results: Reviewed and documented in Electronic Record Micro Results: Reviewed and documented in Electronic Record Studies/Results: Reviewed and documented in Electronic Record Medications: I have reviewed the patient's current medications. Scheduled Meds:   . enoxaparin  40 mg Subcutaneous Q24H  . hydrocortisone sod succinate (SOLU-CORTEF)  injection  50 mg Intravenous Daily  . HYDROmorphone PCA 0.3 mg/mL   Intravenous Q4H  . insulin aspart  0-5 Units Subcutaneous QHS  . insulin aspart  0-9 Units Subcutaneous TID WC  . potassium chloride  10 mEq Intravenous Q1 Hr x 4  . DISCONTD: methylPREDNISolone (SOLU-MEDROL) injection  40 mg Intravenous Q24H   Continuous Infusions:   . dextrose 5 % and 0.45 % NaCl with KCl 20 mEq/L 1,000 mL (01/26/12 2310)   PRN Meds:.diphenhydrAMINE, diphenhydrAMINE, hydrALAZINE, ketorolac, menthol-cetylpyridinium, naloxone, ondansetron (ZOFRAN) IV, ondansetron, phenol, sodium chloride Assessment/Plan:   # CD flareup (Crohn's disease): s/p ileocecectomy day one. Pain control and Surgical service follow.  Surgical service follows on wound, pain and diet. We will follow on fluid and electrolytes and other medical issues.  # DM (diabetes mellitus): Continue Lantus and sliding scale     LOS: 7 days   Yovana Scogin 01/27/2012, 1:25 PM  Brinlynn Gorton 01/27/2012, 1:24 PM

## 2012-01-27 NOTE — Progress Notes (Signed)
Ileus.  Wean steroids to off.

## 2012-01-27 NOTE — Telephone Encounter (Signed)
Calling to return missed call, but doesn't know who called.

## 2012-01-28 DIAGNOSIS — E46 Unspecified protein-calorie malnutrition: Secondary | ICD-10-CM

## 2012-01-28 LAB — GLUCOSE, CAPILLARY
Glucose-Capillary: 229 mg/dL — ABNORMAL HIGH (ref 70–99)
Glucose-Capillary: 241 mg/dL — ABNORMAL HIGH (ref 70–99)
Glucose-Capillary: 156 mg/dL — ABNORMAL HIGH (ref 70–99)
Glucose-Capillary: 217 mg/dL — ABNORMAL HIGH (ref 70–99)

## 2012-01-28 MED ORDER — POTASSIUM CHLORIDE IN NACL 20-0.45 MEQ/L-% IV SOLN
INTRAVENOUS | Status: DC
  Administered 2012-01-28 – 2012-01-29 (×3): via INTRAVENOUS
  Filled 2012-01-28 (×6): qty 1000

## 2012-01-28 MED ORDER — SODIUM CHLORIDE 0.9 % IJ SOLN
10.0000 mL | INTRAMUSCULAR | Status: DC | PRN
  Administered 2012-01-30 – 2012-02-08 (×5): 10 mL

## 2012-01-28 MED ORDER — HYDROMORPHONE 0.3 MG/ML IV SOLN
INTRAVENOUS | Status: AC
  Administered 2012-01-28: 06:00:00
  Filled 2012-01-28: qty 25

## 2012-01-28 MED ORDER — FAT EMULSION 20 % IV EMUL
250.0000 mL | INTRAVENOUS | Status: AC
  Administered 2012-01-28: 250 mL via INTRAVENOUS
  Filled 2012-01-28: qty 250

## 2012-01-28 MED ORDER — TRACE MINERALS CR-CU-MN-SE-ZN 10-1000-500-60 MCG/ML IV SOLN
INTRAVENOUS | Status: AC
  Administered 2012-01-28: 18:00:00 via INTRAVENOUS
  Filled 2012-01-28: qty 1000

## 2012-01-28 MED ORDER — HYDROMORPHONE 0.3 MG/ML IV SOLN
INTRAVENOUS | Status: AC
  Administered 2012-01-28: 22:00:00
  Filled 2012-01-28: qty 25

## 2012-01-28 MED ORDER — INSULIN ASPART 100 UNIT/ML ~~LOC~~ SOLN
0.0000 [IU] | SUBCUTANEOUS | Status: DC
  Administered 2012-01-28: 3 [IU] via SUBCUTANEOUS
  Administered 2012-01-28: 2 [IU] via SUBCUTANEOUS
  Administered 2012-01-29: 3 [IU] via SUBCUTANEOUS
  Administered 2012-01-29: 5 [IU] via SUBCUTANEOUS
  Administered 2012-01-29 – 2012-01-30 (×5): 3 [IU] via SUBCUTANEOUS
  Administered 2012-01-30: 5 [IU] via SUBCUTANEOUS

## 2012-01-28 NOTE — Progress Notes (Signed)
Medical Student Daily Progress Note  Subjective: Pt is a 46 yo male with a Hx of Crohn disease and diabetes, who presents with 1 day hx of abdominal pain and distention. CT is consistent with acute Crohn flare up. Pt had 7x flare ups in the past. Problem with med adherence due to socioeconomic reasons. 24 hr events:   - s/p ileocecectomy 3/12. POD3  - afebrile  - endorses bloating LLQ this am. Still endorses worsened pain, bloating. Good NG output.             - still have not passed gas after surgery (endorses "almost" passed gas)   - diet: ice chips, swab, endorses 1 episode of nausea, no emesis.  - foley out POD1 am, V/S  - ambulated yesterday to chair.  - denies CP, SOB.     Objective: Vital signs in last 24 hours: Filed Vitals:   01/27/12 2207 01/28/12 0005 01/28/12 0340 01/28/12 0606  BP: 148/69   139/57  Pulse: 106   107  Temp: 98 F (36.7 C)   98.4 F (36.9 C)  TempSrc:      Resp: 20 21 24 18   SpO2: 93% 94% 96% 96%   Weight change:   Intake/Output Summary (Last 24 hours) at 01/28/12 0826 Last data filed at 01/28/12 8182  Gross per 24 hour  Intake    800 ml  Output   1750 ml  Net   -950 ml   Physical Exam:  Vitals reviewed. General: resting in bed, looks uncomfortable, but NAD. NG tube in. HEENT: NG in place. Brown drainage collected. Cardiac: RRR, no rubs, gallops. Flow murmur heard loudest at the aortic valve. Pulm: clear to auscultation bilaterally, no wheezes, rales, or rhonchi Abd: BS not heard, tender, mildly distended. Unable to palpate deeply due to pain. Dressing over midline incision intact, no leakage. No rebound. Ext: warm and well perfused, no pedal edema, no tenderness, SCDs off (pt endorses discomfort, nurse said will put back on).   Lab Results: Basic Metabolic Panel:  Lab 99/37/16 1157 01/26/12 0500  Halia Franey 136 135  K 3.6 3.2*  CL 97 100  CO2 29 27  GLUCOSE 222* 234*  BUN 7 7  CREATININE 0.80 0.85  CALCIUM 8.8 7.7*  MG 2.2 1.6  PHOS --  --   Liver Function Tests:  Lab 01/25/12 0610  AST 13  ALT 38  ALKPHOS 74  BILITOT 0.4  PROT 6.3  ALBUMIN 3.0*   CBC:  Lab 01/26/12 0500 01/22/12 0630  WBC 11.1* 7.4  NEUTROABS -- --  HGB 14.6 13.9  HCT 43.3 41.0  MCV 84.2 83.8  PLT 292 257    CBG:  Lab 01/28/12 0603 01/27/12 2205 01/27/12 1638 01/27/12 1110 01/27/12 0631 01/26/12 2114  GLUCAP 229* 194* 238* 231* 239* 227*  Medications: I have reviewed the patient's current medications. Scheduled Meds:    . enoxaparin  40 mg Subcutaneous Q24H  . hydrocortisone sod succinate (SOLU-CORTEF) injection  50 mg Intravenous Daily  . HYDROmorphone PCA 0.3 mg/mL   Intravenous Q4H  . HYDROmorphone PCA 0.3 mg/mL      . insulin aspart  0-5 Units Subcutaneous QHS  . insulin aspart  0-9 Units Subcutaneous TID WC   Continuous Infusions:    . dextrose 5 % and 0.45 % NaCl with KCl 20 mEq/L 100 mL/hr at 01/27/12 1533   PRN Meds:.diphenhydrAMINE, diphenhydrAMINE, hydrALAZINE, ketorolac, menthol-cetylpyridinium, naloxone, ondansetron (ZOFRAN) IV, ondansetron, phenol, sodium chloride Assessment/Plan:  46 year old man with past medical  history of Crohn's disease for past 5 years and diabetes comes to the emergency department with what seems to be his usual Crohn's disease flareup and small bowel obstruction as a complication.  #1 Crohn's disease flareup and SBO  - Pt s/p ileocecectomy POD1. Endorses pain and continued distention. - H 107, R 18, afebrile - CBC: WBC 11.1, H/H normal. BMP: Andrell Tallman 135 -> 136, K 3.2 -> 3.6, Mg 1.6 -> 2.2, BUN 7, Crt 0.80. Low lytes possibly due to NG suction or dilution effect.  - continue PCA dilaudid.   - throat lozenges for discomfort 2/2 NG.  - Tolerating ice chips, ADAT  - monitor for passage of gas and stool (none yet)  - NG tube in. Lytes (K, Mg) wnl, cont monitor, replete prn.  - encourage IS, OOB.  - steroid d/ced.  - f/u GI outpt after d/c  - will likely require chronic immunomodulator (he was  on pentasa before, but had prob w med adherence, dz worsened as a result)  - PPD negative  - Last KUB on 3/11 shows obstruction is improving.  - C diff test negative  - D5 1/2NS w 74mq KCl 100cc/hr  - consider SW/CM involvement due to socioeconomic factors in med adherence.  - consider counselling for smoking cessation.  - Zofran for nausea and vomiting    #2 DM, type II  - CBG 229 - A1c 10.3, was 8.6 a year ago. - Novolog - lantus d/ced - will Restart metformin and gliburide at discharge   #3 DVT PPX - Lovenox, SCDs     LOS: 8 days  Resident Co-sign Daily Note: I have seen the patient and reviewed the daily progress note by LNicoletta DressMS 3 and discussed the care of the patient with them.  See below for documentation of my findings, assessment, and plans.  Subjective: Patient states that he feels a little better. Pain well controlled by PCA. Not passing gas yet. Still NPO with NG suction. Tolerated well.  Objective: Vital signs in last 24 hours: Filed Vitals:   01/28/12 0606 01/28/12 1500 01/28/12 1651 01/28/12 2048  BP: 139/57 107/62  133/83  Pulse: 107 101  118  Temp: 98.4 F (36.9 C) 99.4 F (37.4 C)  99.7 F (37.6 C)  TempSrc:      Resp: 18 20 14 20   Height:  6' 2"  (1.88 m)    Weight:  210 lb (95.255 kg)    SpO2: 96% 94% 98% 93%   Physical Exam: Vitals reviewed.  General: resting in bed, looks uncomfortable, but NAD. NG tube in.  HEENT: NG in place. Brown drainage collected.  Cardiac: RRR, no rubs, gallops. Pulm: clear to auscultation bilaterally, no wheezes, rales, or rhonchi  Abd: BS not heard, tender, mildly distended. Unable to palpate deeply due to pain. Dressing over midline incision intact, no leakage. No rebound.  Ext: warm and well perfused, no pedal edema, no tenderness, SCDs off (pt endorses discomfort, nurse said will put back on).     Lab Results: Reviewed and documented in Electronic Record Micro Results: Reviewed and documented in Electronic  Record Studies/Results: Reviewed and documented in Electronic Record Medications: I have reviewed the patient's current medications. Scheduled Meds:   . enoxaparin  40 mg Subcutaneous Q24H  . HYDROmorphone PCA 0.3 mg/mL   Intravenous Q4H  . HYDROmorphone PCA 0.3 mg/mL      . insulin aspart  0-9 Units Subcutaneous Q4H  . DISCONTD: insulin aspart  0-5 Units Subcutaneous QHS  . DISCONTD:  insulin aspart  0-9 Units Subcutaneous TID WC   Continuous Infusions:   . 0.45 % NaCl with KCl 20 mEq / L 60 mL/hr at 01/28/12 1351  . dextrose 5 % and 0.45 % NaCl with KCl 20 mEq/L 100 mL/hr at 01/27/12 1533  . TPN (CLINIMIX) +/- additives 40 mL/hr at 01/28/12 1823   And  . fat emulsion 250 mL (01/28/12 1824)   PRN Meds:.diphenhydrAMINE, diphenhydrAMINE, hydrALAZINE, ketorolac, menthol-cetylpyridinium, naloxone, ondansetron (ZOFRAN) IV, ondansetron, phenol, sodium chloride, sodium chloride Assessment/Plan:  # Crohn disease flareup and ABO - continue NG suction, pain control, IVF - monitor electrolytes imbalance - encourage OOB -GI consult follow  #2 DM, type II  - CBG 229  - A1c 10.3, was 8.6 a year ago.  - Novolog  - will Restart metformin and gliburide at discharge    LOS: 8 days   Etta Gassett 01/28/2012, 9:38 PM

## 2012-01-28 NOTE — Progress Notes (Signed)
PARENTERAL NUTRITION CONSULT NOTE - INITIAL  Pharmacy Consult for TPN Indication: post-op ileus s/p SBR  No Known Allergies  Patient Measurements:  RN to weigh pt after PICC placement procedure over  Vital Signs: Temp: 98.4 F (36.9 C) (03/15 0606) BP: 139/57 mmHg (03/15 0606) Pulse Rate: 107  (03/15 0606) Intake/Output from previous day: 03/14 0701 - 03/15 0700 In: 800 [I.V.:800] Out: 1750 [Urine:700; Emesis/NG output:1050] Intake/Output from this shift:    Labs:  Basename 01/26/12 0500  WBC 11.1*  HGB 14.6  HCT 43.3  PLT 292  APTT --  INR --     Basename 01/27/12 1157 01/26/12 0500  NA 136 135  K 3.6 3.2*  CL 97 100  CO2 29 27  GLUCOSE 222* 234*  BUN 7 7  CREATININE 0.80 0.85  LABCREA -- --  CREAT24HRUR -- --  CALCIUM 8.8 7.7*  MG 2.2 1.6  PHOS -- --  PROT -- --  ALBUMIN -- --  AST -- --  ALT -- --  ALKPHOS -- --  BILITOT -- --  BILIDIR -- --  IBILI -- --  PREALBUMIN -- --  TRIG -- --  CHOLHDL -- --  CHOL -- --  phosphorus 2.7 on 01/20/12  CrCl is unknown because there is no height on file for the current visit.   Medical History: Past Medical History  Diagnosis Date  . Crohn disease 2008    with hospital admission in 2011, and 8/12 for flare up and SBO relieved with bowel rest. no suregery, no meds for CD  . Diabetes mellitus type II     Medications:  Scheduled:    . enoxaparin  40 mg Subcutaneous Q24H  . HYDROmorphone PCA 0.3 mg/mL   Intravenous Q4H  . HYDROmorphone PCA 0.3 mg/mL      . insulin aspart  0-5 Units Subcutaneous QHS  . insulin aspart  0-9 Units Subcutaneous TID WC   Infusions:    . dextrose 5 % and 0.45 % NaCl with KCl 20 mEq/L 100 mL/hr at 01/27/12 1533    Basename 01/28/12 1134 01/28/12 0603 01/27/12 2205  GLUCAP 241* 229* 194*   Insulin Requirements in the past 24 hours:  6 units SSI on sens scale TIDac with HS coverage  Current Nutrition:  NPO  Nutritional Goals:   kCal,  grams of protein per  day  Assessment: 46 yo M, 3 days post-op SBR for Crohn's dz, now with ileus.  GI ngt with thick dark bilious outpt; abd soft w/some distention  Endo cbg elevated at baseline  Lytes/Renal No labs this am but have been stable  Hepatobil lfts wnl  Pulm/Neuro 96% on RA  Cardiology bp 139/57 P100s  ID afeb off abx  Px Sq lovenox    Plan:  1.  Start tpn with Clinimix E 5/15 at 40 ml/hr 2.  MVI/TE/lipids at 10 ml/hr MWF only 2/2 national shortage 3.  F/u am tna labs 4.  Add insulin to tna 20 units/bag (=10 units/L)  5.  Change cbg / ssi to q4h 6.  Change IVF to 0.45NSw20K at 60 ml/hr when tna starts 7.  F/u pt ht/wt and RD rec's for goals (no ht/wt on file; currently placing PICC line, RN to weight pt after procedure completed)  Bryson Ha L. Amada Jupiter, PharmD, [place] Clinical Pharmacist Pager: [phone] 01/28/2012 12:21 PM

## 2012-01-28 NOTE — Progress Notes (Signed)
Await ileus resolution.  Add TNA for ileus and nutritional supplementation

## 2012-01-28 NOTE — Progress Notes (Signed)
Received call from Marcelino Scot of [facility], per Ms Ma Rings, pt is activate with [facility] and has an orange card.  Will continue to follow for d/c needs.  Jasmine Pang RN MPH Case manager [phone]

## 2012-01-28 NOTE — Progress Notes (Signed)
Patient ID: Jared [name], male   DOB: 09-13-1966, 46 y.o.   MRN: [id number] 3 Days Post-Op  Subjective: Pt c/o abdominal pain.  No flatus.  Did get up once yesterday.  Objective: Vital signs in last 24 hours: Temp:  [97.6 F (36.4 C)-98.4 F (36.9 C)] 98.4 F (36.9 C) (03/15 0606) Pulse Rate:  [97-107] 107  (03/15 0606) Resp:  [16-26] 18  (03/15 0606) BP: (129-148)/(57-69) 139/57 mmHg (03/15 0606) SpO2:  [92 %-96 %] 96 % (03/15 0606) FiO2 (%):  [31 %-34 %] 34 % (03/14 1600) Last BM Date: 01/23/12  Intake/Output from previous day: 03/14 0701 - 03/15 0700 In: 800 [I.V.:800] Out: 1750 [Urine:700; Emesis/NG output:1050] Intake/Output this shift:    PE: Abd: soft, still with some distention.  Still quite tender.  Dressing removed.  Wound unpacked.  Wound is clean.  NGT with thick dark bilious output.  Lab Results:   Basename 01/26/12 0500  WBC 11.1*  HGB 14.6  HCT 43.3  PLT 292   BMET  Basename 01/27/12 1157 01/26/12 0500  NA 136 135  K 3.6 3.2*  CL 97 100  CO2 29 27  GLUCOSE 222* 234*  BUN 7 7  CREATININE 0.80 0.85  CALCIUM 8.8 7.7*   PT/INR No results found for this basename: LABPROT:2,INR:2 in the last 72 hours   Studies/Results: No results found.  Anti-infectives: Anti-infectives     Start     Dose/Rate Route Frequency Ordered Stop   01/24/12 1722   ertapenem (INVANZ) 1 g in sodium chloride 0.9 % 50 mL IVPB        1 g 100 mL/hr over 30 Minutes Intravenous 60 min pre-op 01/24/12 1722 01/25/12 0948           Assessment/Plan  1. Crohn's disease 2. S/p SBR 3. Post op ileus 4. Steroid use, d/c 5. PCM/TNA  Plan: 1. Given that the patient has been on steroids and has an ileus, we will start TNA today to give patient nutritional support and protein to help with healing of wound and anastomosis.   2. Cont NGT and bowel rest 3. Begin NS WD dressing changes to abdominal wound 4. Steroids stopped yesterday 5. OOB and ambulate TID.   LOS: 8 days     Veanna Dower E 01/28/2012

## 2012-01-29 LAB — CBC
HCT: 42.2 % (ref 39.0–52.0)
Hemoglobin: 14.3 g/dL (ref 13.0–17.0)
MCH: 28.8 pg (ref 26.0–34.0)
MCHC: 33.9 g/dL (ref 30.0–36.0)
MCV: 84.9 fL (ref 78.0–100.0)
Platelets: 288 10*3/uL (ref 150–400)
RBC: 4.97 MIL/uL (ref 4.22–5.81)
RDW: 13.8 % (ref 11.5–15.5)
WBC: 13.6 10*3/uL — ABNORMAL HIGH (ref 4.0–10.5)

## 2012-01-29 LAB — COMPREHENSIVE METABOLIC PANEL
ALT: 13 U/L (ref 0–53)
AST: 10 U/L (ref 0–37)
Albumin: 2.4 g/dL — ABNORMAL LOW (ref 3.5–5.2)
Alkaline Phosphatase: 65 U/L (ref 39–117)
BUN: 9 mg/dL (ref 6–23)
CO2: 27 mEq/L (ref 19–32)
Calcium: 8.6 mg/dL (ref 8.4–10.5)
Chloride: 93 mEq/L — ABNORMAL LOW (ref 96–112)
Creatinine, Ser: 0.79 mg/dL (ref 0.50–1.35)
GFR calc Af Amer: >90 mL/min (ref >90)
GFR calc non Af Amer: >90 mL/min (ref >90)
Glucose, Bld: 220 mg/dL — ABNORMAL HIGH (ref 70–99)
Potassium: 3.2 mEq/L — ABNORMAL LOW (ref 3.5–5.1)
Sodium: 133 mEq/L — ABNORMAL LOW (ref 135–145)
Total Bilirubin: 0.8 mg/dL (ref 0.3–1.2)
Total Protein: 6.4 g/dL (ref 6.0–8.3)

## 2012-01-29 LAB — MAGNESIUM: Magnesium: 2 mg/dL (ref 1.5–2.5)

## 2012-01-29 LAB — DIFFERENTIAL
Basophils Absolute: 0 10*3/uL (ref 0.0–0.1)
Basophils Relative: 0 % (ref 0–1)
Eosinophils Absolute: 0.1 10*3/uL (ref 0.0–0.7)
Eosinophils Relative: 1 % (ref 0–5)
Lymphocytes Relative: 11 % — ABNORMAL LOW (ref 12–46)
Lymphs Abs: 1.5 10*3/uL (ref 0.7–4.0)
Monocytes Absolute: 1.9 10*3/uL — ABNORMAL HIGH (ref 0.1–1.0)
Monocytes Relative: 14 % — ABNORMAL HIGH (ref 3–12)
Neutro Abs: 10.6 10*3/uL — ABNORMAL HIGH (ref 1.7–7.7)
Neutrophils Relative %: 75 % (ref 43–77)

## 2012-01-29 LAB — GLUCOSE, CAPILLARY
Glucose-Capillary: 215 mg/dL — ABNORMAL HIGH (ref 70–99)
Glucose-Capillary: 220 mg/dL — ABNORMAL HIGH (ref 70–99)
Glucose-Capillary: 234 mg/dL — ABNORMAL HIGH (ref 70–99)
Glucose-Capillary: 248 mg/dL — ABNORMAL HIGH (ref 70–99)
Glucose-Capillary: 266 mg/dL — ABNORMAL HIGH (ref 70–99)
Glucose-Capillary: 276 mg/dL — ABNORMAL HIGH (ref 70–99)

## 2012-01-29 LAB — PHOSPHORUS: Phosphorus: 3.1 mg/dL (ref 2.3–4.6)

## 2012-01-29 LAB — TRIGLYCERIDES: Triglycerides: 167 mg/dL — ABNORMAL HIGH (ref <150)

## 2012-01-29 LAB — PREALBUMIN: Prealbumin: 8 mg/dL — ABNORMAL LOW (ref 17.0–34.0)

## 2012-01-29 LAB — CHOLESTEROL, TOTAL: Cholesterol: 158 mg/dL (ref 0–200)

## 2012-01-29 MED ORDER — INSULIN REGULAR HUMAN 100 UNIT/ML IJ SOLN
INTRAVENOUS | Status: AC
  Administered 2012-01-29: 18:00:00 via INTRAVENOUS
  Filled 2012-01-29: qty 2000

## 2012-01-29 MED ORDER — HYDROMORPHONE 0.3 MG/ML IV SOLN
INTRAVENOUS | Status: AC
  Administered 2012-01-29: 1.8 mg
  Filled 2012-01-29: qty 25

## 2012-01-29 MED ORDER — POTASSIUM CHLORIDE 10 MEQ/50ML IV SOLN
10.0000 meq | INTRAVENOUS | Status: AC
  Administered 2012-01-29 (×3): 10 meq via INTRAVENOUS
  Filled 2012-01-29 (×3): qty 50

## 2012-01-29 MED ORDER — HYDROMORPHONE 0.3 MG/ML IV SOLN
INTRAVENOUS | Status: AC
  Filled 2012-01-29: qty 25

## 2012-01-29 NOTE — Progress Notes (Signed)
INITIAL ADULT NUTRITION ASSESSMENT Date: 01/29/2012   Time: 10:23 AM  Reason for Assessment: New TPN  ASSESSMENT: Male 45 y.o.  Dx: Small Bowel Resection  Hx:  Past Medical History  Diagnosis Date  . Crohn disease 2008    with hospital admission in 2011, and 8/12 for flare up and SBO relieved with bowel rest. no suregery, no meds for CD  . Diabetes mellitus type II    Related Meds:     . enoxaparin  40 mg Subcutaneous Q24H  . HYDROmorphone PCA 0.3 mg/mL   Intravenous Q4H  . HYDROmorphone PCA 0.3 mg/mL      . insulin aspart  0-9 Units Subcutaneous Q4H  . DISCONTD: insulin aspart  0-5 Units Subcutaneous QHS  . DISCONTD: insulin aspart  0-9 Units Subcutaneous TID WC   Ht: 6' 2"  (188 cm)  Wt: 210 lb (95.255 kg)  Ideal Wt: 86.4 kg % Ideal Wt: 110%  Usual Wt: 220 lb (100 kg) per patient  % Usual Wt: 95.3%  Body mass index is 26.96 kg/(m^2). Pt meets criteria for overweight status.  Food/Nutrition Related Hx: unable to obtain much hx from pt at this time; pt somnolent. Noted elevated HgbA1c of 10.3 and hx of medical noncompliance.  Labs:  CMP     Component Value Date/Time   NA 133* 01/29/2012 0600   K 3.2* 01/29/2012 0600   CL 93* 01/29/2012 0600   CO2 27 01/29/2012 0600   GLUCOSE 220* 01/29/2012 0600   BUN 9 01/29/2012 0600   CREATININE 0.79 01/29/2012 0600   CALCIUM 8.6 01/29/2012 0600   PROT 6.4 01/29/2012 0600   ALBUMIN 2.4* 01/29/2012 0600   AST 10 01/29/2012 0600   ALT 13 01/29/2012 0600   ALKPHOS 65 01/29/2012 0600   BILITOT 0.8 01/29/2012 0600   GFRNONAA >90 01/29/2012 0600   GFRAA >90 01/29/2012 0600  Phosphorus 3.1 WNL Magnesium 2.0 WNL  CBG (last 3)   Basename 01/29/12 0854 01/29/12 0412 01/29/12 0038  GLUCAP 266* 276* 215*   Lab Results  Component Value Date   HGBA1C 10.3* 01/20/2012   Lipid Panel     Component Value Date/Time   CHOL 158 01/29/2012 0600   TRIG 167* 01/29/2012 0600   Intake/Output I/O last 3 completed shifts: In: 2120 [I.V.:1520] Out:  4900 [Urine:2450; Emesis/NG output:2450]    Diet Order:   NPO  Supplements/Tube Feeding: TPN  IVF:    0.45 % NaCl with KCl 20 mEq / L Last Rate: 60 mL/hr at 01/29/12 0120  dextrose 5 % and 0.45 % NaCl with KCl 20 mEq/L Last Rate: 100 mL/hr at 01/27/12 1533  TPN (CLINIMIX) +/- additives Last Rate: 40 mL/hr at 01/28/12 1823  And   fat emulsion Last Rate: 250 mL (01/28/12 1824)   Estimated Nutritional Needs:   Kcal: 2100 - 2300 kcal Protein:  115 - 130 grams  Fluid:  2.1 - 2.3 L/d  Admitted 3/7 with abdominal pain and distention. Notable hx of Crohn's disease for 5 years and DM. Work-up has revealed sx consistent with pt's usual Crohn's flare-up and small bowel obstruction as a complication. Hx of medical noncompliance 2/2 financial reasons.  Initially made NPO. Advanced to clears by day 2 of admission and tolerated well. Advanced to full liquids on day 3, tolerating well and then ordered for low fiber on day 4 of admission. Symptoms worsened with advancement of diet, BMs stopped and distention reoccured. Diet subsequently downgraded to clears.  Exp-lap and small bowel resection (ileocecectomy) performed  3/12. Developed expected ileus s/p surgery. NGT placed for suctioning. Output within 24 hours has been +1400 cc.  TPN initiated 3/15 for post-op ileus. Patient is receiving TPN with Clinimix E 5/15 @ 40 ml/hr.  Lipids (20% IVFE @ 10 ml/hr), multivitamins, and trace elements are provided 3 times weekly (MWF) due to national backorder.  Provides 1162 kcal and 48 grams protein daily (based on weekly average).  Meets 55% minimum estimated kcal and 42% minimum estimated protein needs.  Additional IVF with 0.45% NaCl with KCl 20 mEq @ 60 ml/hr.  NUTRITION DIAGNOSIS: -Inadequate oral intake (NI-2.1).  Status: Ongoing  RELATED TO: inability to eat  AS EVIDENCE BY: NPO status, need for TPN.  MONITORING/EVALUATION(Goals): Goal: TPN to provide at least 90% of kcal and protein needs.  Unmet. Monitor: TPN use, weights, labs, ability to advance diet  EDUCATION NEEDS: -Education not appropriate at this time  INTERVENTION: 1. TPN per pharmacy, RD to adjust interventions in accordance with TPN pharmacist 2. Pt would benefit from DM education as HgbA1c is 10.3. Will educate when appropriate. 3. RD to follow nutrition care plan  Dietitian #: [phone]  [place] Per approved criteria  -Not Applicable    Asencion Partridge 01/29/2012, 10:23 AM

## 2012-01-29 NOTE — Progress Notes (Signed)
Medical Student Daily Progress Note  Subjective: Pt is a 46 yo male with a Hx of Crohn disease and diabetes, who presents with 1 day hx of abdominal pain and distention. CT is consistent with acute Crohn flare up. Pt had 7x flare ups in the past. Problem with med adherence due to socioeconomic reasons. 24 hr events:   - s/p ileocecectomy 3/12. POD3  - afebrile  - still endorses bloating and pain LLQ this am. Said severity is about the same. Good NG output.             - still have not passed gas after surgery (endorses "almost" passed gas)   - diet: TPN, ice chips  - foley out POD1 am, V/S  - ambulating.  - denies CP, SOB.     Objective: Vital signs in last 24 hours: Filed Vitals:   01/28/12 2048 01/29/12 0004 01/29/12 0327 01/29/12 0614  BP: 133/83   142/75  Pulse: 118   117  Temp: 99.7 F (37.6 C)   98 F (36.7 C)  TempSrc:      Resp: 20 21 20 20   Height:      Weight:      SpO2: 93% 94% 92% 91%   Weight change:   Intake/Output Summary (Last 24 hours) at 01/29/12 1116 Last data filed at 01/29/12 0500  Gross per 24 hour  Intake   1320 ml  Output   2900 ml  Net  -1580 ml   Physical Exam:  Vitals reviewed. General: resting in bed, looks uncomfortable, but NAD. NG tube in. HEENT: NG in place. Brown drainage collected. Cardiac: RRR, no rubs, gallops. Flow murmur heard loudest at the aortic valve. Pulm: clear to auscultation bilaterally, no wheezes, rales, or rhonchi Abd: BS not heard, minimally distended, tender, but much improved from previously. Unable to palpate deeply due to pain. Dressing over midline incision intact, no leakage. No rebound. Ext: warm and well perfused, no pedal edema, no tenderness, SCDs off.   Lab Results: Basic Metabolic Panel:  Lab 16/10/96 0600 01/27/12 1157  Gal Feldhaus 133* 136  K 3.2* 3.6  CL 93* 97  CO2 27 29  GLUCOSE 220* 222*  BUN 9 7  CREATININE 0.79 0.80  CALCIUM 8.6 8.8  MG 2.0 2.2  PHOS 3.1 --   Liver Function Tests:  Lab  01/29/12 0600 01/25/12 0610  AST 10 13  ALT 13 38  ALKPHOS 65 74  BILITOT 0.8 0.4  PROT 6.4 6.3  ALBUMIN 2.4* 3.0*   CBC:  Lab 01/29/12 0600 01/26/12 0500  WBC 13.6* 11.1*  NEUTROABS 10.6* --  HGB 14.3 14.6  HCT 42.2 43.3  MCV 84.9 84.2  PLT 288 292    CBG:  Lab 01/29/12 0854 01/29/12 0412 01/29/12 0038 01/28/12 2021 01/28/12 1633 01/28/12 1134  GLUCAP 266* 276* 215* 217* 156* 241*  Medications: I have reviewed the patient's current medications. Scheduled Meds:    . enoxaparin  40 mg Subcutaneous Q24H  . HYDROmorphone PCA 0.3 mg/mL   Intravenous Q4H  . HYDROmorphone PCA 0.3 mg/mL      . insulin aspart  0-9 Units Subcutaneous Q4H  . potassium chloride  10 mEq Intravenous Q1 Hr x 3  . DISCONTD: insulin aspart  0-5 Units Subcutaneous QHS  . DISCONTD: insulin aspart  0-9 Units Subcutaneous TID WC   Continuous Infusions:    . 0.45 % NaCl with KCl 20 mEq / L 60 mL/hr at 01/29/12 0120  . dextrose 5 % and  0.45 % NaCl with KCl 20 mEq/L 100 mL/hr at 01/27/12 1533  . TPN (CLINIMIX) +/- additives 40 mL/hr at 01/28/12 1823   And  . fat emulsion 250 mL (01/28/12 1824)  . TPN (CLINIMIX) +/- additives     PRN Meds:.diphenhydrAMINE, diphenhydrAMINE, hydrALAZINE, ketorolac, menthol-cetylpyridinium, naloxone, ondansetron (ZOFRAN) IV, ondansetron, phenol, sodium chloride, sodium chloride Assessment/Plan:  46 year old man with past medical history of Crohn's disease for past 5 years and diabetes comes to the emergency department with what seems to be his usual Crohn's disease flareup and small bowel obstruction as a complication.  #1 Crohn's disease flareup and SBO  - Pt s/p ileocecectomy POD1. Endorses pain and continued distention. - H 117, R 20, afebrile - CBC: WBC 11.1 -> 13.6 , H/H normal. BMP: Finnlee Guarnieri 136 -> 133, K 3.6 -> 3.2, Mg 2.2 -> 2.0, BUN 9, Crt 0.79. Low lytes possibly due to NG suction or dilution effect.  - continue PCA dilaudid.   - throat lozenges for discomfort 2/2  NG.  - TPN, ice chips, ADAT  - monitor for passage of gas and stool (none yet)  - NG tube in. K is low at 3.2, replete.  - encourage IS, OOB.  - steroid d/ced.  - f/u GI outpt after d/c  - will likely require chronic immunomodulator (he was on pentasa before, but had prob w med adherence, dz worsened as a result)  - PPD negative  - Last KUB on 3/11 shows obstruction is improving.  - C diff test negative  - 1/2NS w 45mq KCl 100cc/hr  - consider SW/CM involvement due to socioeconomic factors in med adherence.  - consider counselling for smoking cessation.  - Zofran for nausea and vomiting    #2 DM, type II  - CBG 220 - A1c 10.3, was 8.6 a year ago. - Novolog - lantus d/ced - will Restart metformin and gliburide at discharge   #3 DVT PPX - Lovenox, SCDs    LOS: 9 days   This is a MCareers information officerNote. The care of the patient was discussed with Dr. LNicoletta Dressand the assessment and plan formulated with their assistance. Please see their attached note for official documentation of the daily encounter.   LAnola Gurney3/16/2013, 11:16 AM Resident Co-sign Daily Note: I have seen the patient and reviewed the daily progress note by LNicoletta DressMS 3 and discussed the care of the patient with them.  See below for documentation of my findings, assessment, and plans.  Subjective: Patient states that his pain is well controlled by PCA. NG suction 1400/last 24 hours. No c/o N/V.  Objective: Vital signs in last 24 hours: Filed Vitals:   01/28/12 2048 01/29/12 0004 01/29/12 0327 01/29/12 0614  BP: 133/83   142/75  Pulse: 118   117  Temp: 99.7 F (37.6 C)   98 F (36.7 C)  TempSrc:      Resp: 20 21 20 20   Height:      Weight:      SpO2: 93% 94% 92% 91%   Physical Exam:. Vitals reviewed.  General: resting in bed, looks uncomfortable, but NAD. NG tube in.  HEENT: NG in place. Brown drainage collected.  Cardiac: RRR, no rubs, gallops. Pulm: clear to auscultation bilaterally, no wheezes, rales, or  rhonchi  Abd: BS not heard, minimally distended, tender, but much improved from previously. Unable to palpate deeply due to pain. Dressing over midline incision intact, no leakage. No rebound.  Ext: warm and well perfused, no pedal edema,  no tenderness, SCDs off.  Lab Results: Reviewed and documented in Electronic Record Micro Results: Reviewed and documented in Electronic Record Studies/Results: Reviewed and documented in Electronic Record Medications: I have reviewed the patient's current medications. Scheduled Meds:   . enoxaparin  40 mg Subcutaneous Q24H  . HYDROmorphone PCA 0.3 mg/mL   Intravenous Q4H  . HYDROmorphone PCA 0.3 mg/mL      . HYDROmorphone PCA 0.3 mg/mL      . insulin aspart  0-9 Units Subcutaneous Q4H  . potassium chloride  10 mEq Intravenous Q1 Hr x 3   Continuous Infusions:   . 0.45 % NaCl with KCl 20 mEq / L 60 mL/hr at 01/29/12 0120  . dextrose 5 % and 0.45 % NaCl with KCl 20 mEq/L 100 mL/hr at 01/27/12 1533  . TPN (CLINIMIX) +/- additives 40 mL/hr at 01/28/12 1823   And  . fat emulsion 250 mL (01/28/12 1824)  . TPN (CLINIMIX) +/- additives     PRN Meds:.diphenhydrAMINE, diphenhydrAMINE, hydrALAZINE, ketorolac, menthol-cetylpyridinium, naloxone, ondansetron (ZOFRAN) IV, ondansetron, phenol, sodium chloride, sodium chloride Assessment/Plan:  LOS: 9 days   # Crohn disease flareup and ABO  - continue NG suction, pain control, IVF  - monitor electrolytes imbalance  - encourage OOB  -GI consult follow   # hypokalemia Replaced Follow BMP  #2 DM, type II  - CBG 229  - A1c 10.3, was 8.6 a year ago.  - Novolog  - will Restart metformin and gliburide at discharge     [place], Lavern Crimi 01/29/2012, 2:07 PM

## 2012-01-29 NOTE — Progress Notes (Signed)
PARENTERAL NUTRITION CONSULT NOTE - FOLLOW UP  Pharmacy Consult for TPN Indication: post-op ileus s/p SBR   No Known Allergies  Patient Measurements: Height: 6' 2"  (188 cm) Weight: 210 lb (95.255 kg) IBW/kg (Calculated) : 82.2   Vital Signs: Temp: 98 F (36.7 C) (03/16 0614) BP: 142/75 mmHg (03/16 0614) Pulse Rate: 117  (03/16 0614) Intake/Output from previous day: 03/15 0701 - 03/16 0700 In: 1320 [I.V.:720; TPN:600] Out: 2900 [Urine:1500; Emesis/NG output:1400] Intake/Output from this shift:    Labs:  Basename 01/29/12 0600  WBC 13.6*  HGB 14.3  HCT 42.2  PLT 288  APTT --  INR --     Basename 01/29/12 0600 01/27/12 1157  NA 133* 136  K 3.2* 3.6  CL 93* 97  CO2 27 29  GLUCOSE 220* 222*  BUN 9 7  CREATININE 0.79 0.80  LABCREA -- --  CREAT24HRUR -- --  CALCIUM 8.6 8.8  MG 2.0 2.2  PHOS 3.1 --  PROT 6.4 --  ALBUMIN 2.4* --  AST 10 --  ALT 13 --  ALKPHOS 65 --  BILITOT 0.8 --  BILIDIR -- --  IBILI -- --  PREALBUMIN -- --  TRIG 167* --  CHOLHDL -- --  CHOL 158 --   Estimated Creatinine Clearance: 135.6 ml/min (by C-G formula based on Cr of 0.79).    Basename 01/29/12 0854 01/29/12 0412 01/29/12 0038  GLUCAP 266* 276* 215*    Medications:  Scheduled:    . enoxaparin  40 mg Subcutaneous Q24H  . HYDROmorphone PCA 0.3 mg/mL   Intravenous Q4H  . HYDROmorphone PCA 0.3 mg/mL      . insulin aspart  0-9 Units Subcutaneous Q4H  . DISCONTD: insulin aspart  0-5 Units Subcutaneous QHS  . DISCONTD: insulin aspart  0-9 Units Subcutaneous TID WC    Insulin Requirements in the past 24 hours:  11 units  Current Nutrition:  NPO  Assessment: 46 yo M, 3 days post-op SBR for Crohn's dz, now with ileus.  1.  Lytes- K is low 2.  Endo- cbg's increasing on reduced rate of TPN 3.  Nutr- TG 167, palb is pending.  RD consult  Nutritional Goals:   Plan:  1.  K runs x 3 2.  Continue TPN at current rate due to cbg's 3.  BMet in AM 4.  RD consult 5.   Increase insulin in tpn  Corinthian Kemler P 01/29/2012,10:38 AM

## 2012-01-29 NOTE — Progress Notes (Signed)
Patient ID: Jared [name], male   DOB: May 27, 1966, 46 y.o.   MRN: [id number] 4 Days Post-Op  Subjective: Feels about the same.  Some abdominal pain not severe. No flatus or BM  Objective: Vital signs in last 24 hours: Temp:  [98 F (36.7 C)-99.7 F (37.6 C)] 98 F (36.7 C) (03/16 0034) Pulse Rate:  [101-118] 117  (03/16 0614) Resp:  [14-21] 20  (03/16 0614) BP: (107-142)/(62-83) 142/75 mmHg (03/16 0614) SpO2:  [91 %-98 %] 91 % (03/16 0614) FiO2 (%):  [31 %-94 %] 94 % (03/16 0800) Weight:  [210 lb (95.255 kg)] 210 lb (95.255 kg) (03/15 1500) Last BM Date: 01/23/12  Intake/Output from previous day: 03/15 0701 - 03/16 0700 In: 1320 [I.V.:720; TPN:600] Out: 2900 [Urine:1500; Emesis/NG output:1400] Intake/Output this shift:    General appearance: alert and mild distress GI: abnormal findings:  mild tenderness in the entire abdomen Incision/Wound: Packed, clean  Lab Results:   Basename 01/29/12 0600  WBC 13.6*  HGB 14.3  HCT 42.2  PLT 288   BMET  Basename 01/29/12 0600 01/27/12 1157  NA 133* 136  K 3.2* 3.6  CL 93* 97  CO2 27 29  GLUCOSE 220* 222*  BUN 9 7  CREATININE 0.79 0.80  CALCIUM 8.6 8.8     Studies/Results: No results found.  Anti-infectives: Anti-infectives     Start     Dose/Rate Route Frequency Ordered Stop   01/24/12 1722   ertapenem (INVANZ) 1 g in sodium chloride 0.9 % 50 mL IVPB        1 g 100 mL/hr over 30 Minutes Intravenous 60 min pre-op 01/24/12 1722 01/25/12 0948          Assessment/Plan: s/p Procedure(s): EXPLORATORY LAPAROTOMY SMALL BOWEL RESECTION Ileus, not enexpected.  Continue TNA, NG.  Encouraged to cough, OOB Hypokalemia- pharmacy managing TNA Check lab in AM   LOS: 9 days    Adaleena Mooers T 01/29/2012

## 2012-01-30 LAB — BASIC METABOLIC PANEL
BUN: 9 mg/dL (ref 6–23)
CO2: 27 mEq/L (ref 19–32)
Calcium: 8.6 mg/dL (ref 8.4–10.5)
Chloride: 95 mEq/L — ABNORMAL LOW (ref 96–112)
Creatinine, Ser: 0.82 mg/dL (ref 0.50–1.35)
GFR calc Af Amer: >90 mL/min (ref >90)
GFR calc non Af Amer: >90 mL/min (ref >90)
Glucose, Bld: 252 mg/dL — ABNORMAL HIGH (ref 70–99)
Potassium: 3.8 mEq/L (ref 3.5–5.1)
Sodium: 132 mEq/L — ABNORMAL LOW (ref 135–145)

## 2012-01-30 LAB — CBC
HCT: 40.2 % (ref 39.0–52.0)
Hemoglobin: 14 g/dL (ref 13.0–17.0)
MCH: 29 pg (ref 26.0–34.0)
MCHC: 34.8 g/dL (ref 30.0–36.0)
MCV: 83.4 fL (ref 78.0–100.0)
Platelets: 261 10*3/uL (ref 150–400)
RBC: 4.82 MIL/uL (ref 4.22–5.81)
RDW: 13.4 % (ref 11.5–15.5)
WBC: 15.5 10*3/uL — ABNORMAL HIGH (ref 4.0–10.5)

## 2012-01-30 LAB — GLUCOSE, CAPILLARY
Glucose-Capillary: 235 mg/dL — ABNORMAL HIGH (ref 70–99)
Glucose-Capillary: 243 mg/dL — ABNORMAL HIGH (ref 70–99)
Glucose-Capillary: 252 mg/dL — ABNORMAL HIGH (ref 70–99)
Glucose-Capillary: 253 mg/dL — ABNORMAL HIGH (ref 70–99)
Glucose-Capillary: 259 mg/dL — ABNORMAL HIGH (ref 70–99)
Glucose-Capillary: 260 mg/dL — ABNORMAL HIGH (ref 70–99)

## 2012-01-30 LAB — MAGNESIUM: Magnesium: 2 mg/dL (ref 1.5–2.5)

## 2012-01-30 MED ORDER — SODIUM CHLORIDE 0.9 % IV BOLUS (SEPSIS)
500.0000 mL | Freq: Once | INTRAVENOUS | Status: AC
  Administered 2012-01-30: 500 mL via INTRAVENOUS

## 2012-01-30 MED ORDER — INSULIN ASPART 100 UNIT/ML ~~LOC~~ SOLN
0.0000 [IU] | SUBCUTANEOUS | Status: DC
  Administered 2012-01-30: 11 [IU] via SUBCUTANEOUS
  Administered 2012-01-30 – 2012-01-31 (×4): 7 [IU] via SUBCUTANEOUS
  Administered 2012-01-31: 11 [IU] via SUBCUTANEOUS
  Administered 2012-01-31 (×2): 4 [IU] via SUBCUTANEOUS
  Administered 2012-02-01: 3 [IU] via SUBCUTANEOUS
  Administered 2012-02-01 (×3): 4 [IU] via SUBCUTANEOUS
  Administered 2012-02-04: 3 [IU] via SUBCUTANEOUS
  Administered 2012-02-04: 11 [IU] via SUBCUTANEOUS
  Administered 2012-02-04: 3 [IU] via SUBCUTANEOUS
  Administered 2012-02-05: 7 [IU] via SUBCUTANEOUS
  Administered 2012-02-05: 3 [IU] via SUBCUTANEOUS
  Administered 2012-02-06: 4 [IU] via SUBCUTANEOUS
  Administered 2012-02-06: 7 [IU] via SUBCUTANEOUS
  Administered 2012-02-06: 4 [IU] via SUBCUTANEOUS
  Administered 2012-02-06: 7 [IU] via SUBCUTANEOUS
  Administered 2012-02-06: 4 [IU] via SUBCUTANEOUS
  Administered 2012-02-06: 7 [IU] via SUBCUTANEOUS
  Administered 2012-02-07 (×2): 11 [IU] via SUBCUTANEOUS
  Administered 2012-02-07: 3 [IU] via SUBCUTANEOUS
  Administered 2012-02-07: 4 [IU] via SUBCUTANEOUS

## 2012-01-30 MED ORDER — INSULIN REGULAR HUMAN 100 UNIT/ML IJ SOLN
INTRAVENOUS | Status: AC
  Administered 2012-01-30: 18:00:00 via INTRAVENOUS
  Filled 2012-01-30: qty 2000

## 2012-01-30 MED ORDER — HYDROMORPHONE 0.3 MG/ML IV SOLN
INTRAVENOUS | Status: AC
  Administered 2012-01-30: 1.8 mg
  Filled 2012-01-30: qty 25

## 2012-01-30 NOTE — Progress Notes (Signed)
PARENTERAL NUTRITION CONSULT NOTE - FOLLOW UP  Pharmacy Consult for TPN Indication: post-op ileus s/p SBR  No Known Allergies  Patient Measurements: Height: 6' 2"  (188 cm) Weight: 210 lb (95.255 kg) IBW/kg (Calculated) : 82.2   Vital Signs: Temp: 99.8 F (37.7 C) (03/17 0514) BP: 128/78 mmHg (03/17 0514) Pulse Rate: 119  (03/17 0514) Intake/Output from previous day: 03/16 0701 - 03/17 0700 In: 1440 [P.O.:240; I.V.:480; NG/GT:400; TPN:320] Out: 1550 [Urine:1150; Emesis/NG output:400] Intake/Output from this shift:    Labs:  Basename 01/30/12 0530 01/29/12 0600  WBC 15.5* 13.6*  HGB 14.0 14.3  HCT 40.2 42.2  PLT 261 288  APTT -- --  INR -- --     Basename 01/30/12 0530 01/29/12 0600 01/27/12 1157  NA 132* 133* 136  K 3.8 3.2* 3.6  CL 95* 93* 97  CO2 27 27 29   GLUCOSE 252* 220* 222*  BUN 9 9 7   CREATININE 0.82 0.79 0.80  LABCREA -- -- --  CREAT24HRUR -- -- --  CALCIUM 8.6 8.6 8.8  MG 2.0 2.0 2.2  PHOS -- 3.1 --  PROT -- 6.4 --  ALBUMIN -- 2.4* --  AST -- 10 --  ALT -- 13 --  ALKPHOS -- 65 --  BILITOT -- 0.8 --  BILIDIR -- -- --  IBILI -- -- --  PREALBUMIN -- 8.0* --  TRIG -- 167* --  CHOLHDL -- -- --  CHOL -- 158 --   Estimated Creatinine Clearance: 132.3 ml/min (by C-G formula based on Cr of 0.82).    Basename 01/30/12 1114 01/30/12 0816 01/30/12 0515  GLUCAP 252* 259* 253*    Medications:  Scheduled:    . enoxaparin  40 mg Subcutaneous Q24H  . HYDROmorphone PCA 0.3 mg/mL   Intravenous Q4H  . HYDROmorphone PCA 0.3 mg/mL      . insulin aspart  0-9 Units Subcutaneous Q4H  . potassium chloride  10 mEq Intravenous Q1 Hr x 3  . sodium chloride  500 mL Intravenous Once    Insulin Requirements in the past 24 hours:  11 units SSI, on sensitive scale q4.  Current Nutrition:  NPO  Assessment: 46 yo M, 3 days post-op SBR for Crohn's dz, now with ileus.  GI ?passing flatus  Endo ?cbgs > 200.   Pt with h/o DM, on glybride/metformin PTA    Lytes/Renal ?wnl.  Good uop  Hepatobil ?LFTs wnl on 3/16  Pulm/Neuro ?RA  Cardiology ?128/78, 119- fluid bolus to be given  ID ?afeb, on no abx  Px   Enoxaparin for vte px      Nutritional Goals:  2100-2300 kcal, 115-130 gm pro  Plan:  1.  Continue to hold advancement of tpn rate as cbgs elevated 2.  Increase insulin in tpn and change ssi to resistant scale 3.  TPN labs in AM  Shaheen Star P 01/30/2012,11:48 AM

## 2012-01-30 NOTE — Progress Notes (Signed)
Subjective: Patient is still having significant pain requiring PCA.  Still no flatus or BM yet.     Objective: Vital signs in last 24 hours: Filed Vitals:   01/29/12 2317 01/30/12 0207 01/30/12 0457 01/30/12 0514  BP:  113/76  128/78  Pulse:  119  119  Temp:  97.8 F (36.6 C)  99.8 F (37.7 C)  TempSrc:      Resp: 24 20 18 20   Height:      Weight:      SpO2: 94% 96% 96% 96%   Weight change:   Intake/Output Summary (Last 24 hours) at 01/30/12 0919 Last data filed at 01/30/12 0600  Gross per 24 hour  Intake   1440 ml  Output   1550 ml  Net   -110 ml   Physical Exam:  General: resting in bed, looks uncomfortable, but NAD. NG tube in.  HEENT: NG in place. Brown drainage collected.  Cardiac: RRR, no rubs, gallops.  Pulm: clear to auscultation bilaterally, no wheezes, rales, or rhonchi  Abd: BS not heard, minimally distended, tender diffusely to light touch.  Unable to palpate deeply due to pain. Dressing over midline incision intact, no leakage.  Ext: warm and well perfused, no pedal edema, no tenderness,   Lab Results: Basic Metabolic Panel:  Lab 74/25/95 0530 01/29/12 0600  NA 132* 133*  K 3.8 3.2*  CL 95* 93*  CO2 27 27  GLUCOSE 252* 220*  BUN 9 9  CREATININE 0.82 0.79  CALCIUM 8.6 8.6  MG 2.0 2.0  PHOS -- 3.1   Liver Function Tests:  Lab 01/29/12 0600 01/25/12 0610  AST 10 13  ALT 13 38  ALKPHOS 65 74  BILITOT 0.8 0.4  PROT 6.4 6.3  ALBUMIN 2.4* 3.0*   CBC:  Lab 01/30/12 0530 01/29/12 0600  WBC 15.5* 13.6*  NEUTROABS -- 10.6*  HGB 14.0 14.3  HCT 40.2 42.2  MCV 83.4 84.9  PLT 261 288   CBG:  Lab 01/30/12 0816 01/30/12 0515 01/30/12 0043 01/29/12 2028 01/29/12 1631 01/29/12 1205  GLUCAP 259* 253* 243* 220* 248* 234*   Fasting Lipid Panel:  Lab 01/29/12 0600  CHOL 158  HDL --  LDLCALC --  TRIG 167*  CHOLHDL --  LDLDIRECT --    Micro Results: Recent Results (from the past 240 hour(s))  CLOSTRIDIUM DIFFICILE BY PCR     Status: Normal    Collection Time   01/22/12  7:38 PM      Component Value Range Status Comment   C difficile by pcr NEGATIVE  NEGATIVE  Final   SURGICAL PCR SCREEN     Status: Normal   Collection Time   01/24/12  8:45 PM      Component Value Range Status Comment   MRSA, PCR NEGATIVE  NEGATIVE  Final    Staphylococcus aureus NEGATIVE  NEGATIVE  Final    Medications: I have reviewed the patient's current medications. Scheduled Meds:   . enoxaparin  40 mg Subcutaneous Q24H  . HYDROmorphone PCA 0.3 mg/mL   Intravenous Q4H  . HYDROmorphone PCA 0.3 mg/mL      . insulin aspart  0-9 Units Subcutaneous Q4H  . potassium chloride  10 mEq Intravenous Q1 Hr x 3   Continuous Infusions:   . 0.45 % NaCl with KCl 20 mEq / L 60 mL/hr at 01/30/12 0600  . TPN (CLINIMIX) +/- additives 40 mL/hr at 01/28/12 1823   And  . fat emulsion 250 mL (01/28/12 1824)  .  TPN (CLINIMIX) +/- additives 40 mL/hr at 01/30/12 0600   PRN Meds:.diphenhydrAMINE, diphenhydrAMINE, hydrALAZINE, ketorolac, menthol-cetylpyridinium, naloxone, ondansetron (ZOFRAN) IV, ondansetron, phenol, sodium chloride, sodium chloride Assessment/Plan:   # Crohn disease flareup and ABO:  Underwent ileocecectomy on 3/12.  Still no BM or flatus since surgery, postoperative ileus.  Surgery is managing wound, bowels, pain control.  Pharmacy is managing TPN. NGT output 400 recorded in past 24 hrs.  If this is being recorded correctly, then NGT output is slowing.  Urine output 1150cc yesterday.   -Pain control via PCA per surgery team -NGT management per surgery team -Will monitor electrolytes along with pharmacy team administering TPN - encourage OOB as tolerated.  # hypokalemia: K 3.8 today, doing well on current TPN and fluids. Recheck BMET in AM  #2 DM, type II: Sugars consistently/all in the 200s for the past day.  Getting regular insulin as part of TPN and then SSI sensitive.  - Spoke with pharmacy this morning, they are going to increase insulin given  with TPN - A1c 10.3, was 8.6 a year ago.  - Continue SSI sensitive - will Restart metformin and gliburide at discharge     LOS: 10 days   Orvilla Fus, BRAD 01/30/2012, 9:19 AM

## 2012-01-30 NOTE — Progress Notes (Signed)
5 Days Post-Op  Subjective: Tiny flatus. Has not been ambulating  Objective: Vital signs in last 24 hours: Temp:  [97.8 F (36.6 C)-100.2 F (37.9 C)] 99.8 F (37.7 C) (03/17 0514) Pulse Rate:  [112-119] 119  (03/17 0514) Resp:  [18-24] 20  (03/17 0514) BP: (113-128)/(65-78) 128/78 mmHg (03/17 0514) SpO2:  [94 %-96 %] 96 % (03/17 0514) Last BM Date: 01/23/12  Intake/Output from previous day: 03/16 0701 - 03/17 0700 In: 1440 [P.O.:240; I.V.:480; NG/GT:400; TPN:320] Out: 1550 [Urine:1150; Emesis/NG output:400] Intake/Output this shift:    General appearance: alert and cooperative Resp: clear to auscultation bilaterally Cardio: regular rate and rhythm GI: dressing dry, quiet, NT tachy Lab Results:   Basename 01/30/12 0530 01/29/12 0600  WBC 15.5* 13.6*  HGB 14.0 14.3  HCT 40.2 42.2  PLT 261 288   BMET  Basename 01/30/12 0530 01/29/12 0600  NA 132* 133*  K 3.8 3.2*  CL 95* 93*  CO2 27 27  GLUCOSE 252* 220*  BUN 9 9  CREATININE 0.82 0.79  CALCIUM 8.6 8.6   PT/INR No results found for this basename: LABPROT:2,INR:2 in the last 72 hours ABG No results found for this basename: PHART:2,PCO2:2,PO2:2,HCO3:2 in the last 72 hours  Studies/Results: No results found.  Anti-infectives: Anti-infectives     Start     Dose/Rate Route Frequency Ordered Stop   01/24/12 1722   ertapenem (INVANZ) 1 g in sodium chloride 0.9 % 50 mL IVPB        1 g 100 mL/hr over 30 Minutes Intravenous 60 min pre-op 01/24/12 1722 01/25/12 0948          Assessment/Plan: s/p Procedure(s) (LRB): EXPLORATORY LAPAROTOMY (N/A) SMALL BOWEL RESECTION (N/A) S/P SBR Ileus - needs to ambulate, cont NGT Mild tachy - try fluid bolus FEN TNA VTE - lovenox  LOS: 10 days    Jared [name] 01/30/2012

## 2012-01-31 ENCOUNTER — Inpatient Hospital Stay (HOSPITAL_COMMUNITY): Payer: Self-pay

## 2012-01-31 ENCOUNTER — Encounter (HOSPITAL_COMMUNITY): Payer: Self-pay | Admitting: Surgery

## 2012-01-31 LAB — URINALYSIS, ROUTINE W REFLEX MICROSCOPIC
Glucose, UA: >1000 mg/dL — AB
Hgb urine dipstick: NEGATIVE
Ketones, ur: >80 mg/dL — AB
Nitrite: POSITIVE — AB
Protein, ur: 30 mg/dL — AB
Specific Gravity, Urine: 1.035 — ABNORMAL HIGH (ref 1.005–1.030)
Urobilinogen, UA: 2 mg/dL — ABNORMAL HIGH (ref 0.0–1.0)
pH: 6 (ref 5.0–8.0)

## 2012-01-31 LAB — GLUCOSE, CAPILLARY
Glucose-Capillary: 166 mg/dL — ABNORMAL HIGH (ref 70–99)
Glucose-Capillary: 173 mg/dL — ABNORMAL HIGH (ref 70–99)
Glucose-Capillary: 211 mg/dL — ABNORMAL HIGH (ref 70–99)
Glucose-Capillary: 214 mg/dL — ABNORMAL HIGH (ref 70–99)
Glucose-Capillary: 237 mg/dL — ABNORMAL HIGH (ref 70–99)
Glucose-Capillary: 262 mg/dL — ABNORMAL HIGH (ref 70–99)

## 2012-01-31 LAB — DIFFERENTIAL
Basophils Absolute: 0 10*3/uL (ref 0.0–0.1)
Basophils Relative: 0 % (ref 0–1)
Eosinophils Absolute: 0 10*3/uL (ref 0.0–0.7)
Eosinophils Relative: 0 % (ref 0–5)
Lymphocytes Relative: 5 % — ABNORMAL LOW (ref 12–46)
Lymphs Abs: 1.2 10*3/uL (ref 0.7–4.0)
Monocytes Absolute: 2.7 10*3/uL — ABNORMAL HIGH (ref 0.1–1.0)
Monocytes Relative: 11 % (ref 3–12)
Neutro Abs: 20.8 10*3/uL — ABNORMAL HIGH (ref 1.7–7.7)
Neutrophils Relative %: 84 % — ABNORMAL HIGH (ref 43–77)

## 2012-01-31 LAB — MAGNESIUM: Magnesium: 2.2 mg/dL (ref 1.5–2.5)

## 2012-01-31 LAB — COMPREHENSIVE METABOLIC PANEL
ALT: 16 U/L (ref 0–53)
AST: 18 U/L (ref 0–37)
Albumin: 2 g/dL — ABNORMAL LOW (ref 3.5–5.2)
Alkaline Phosphatase: 93 U/L (ref 39–117)
BUN: 10 mg/dL (ref 6–23)
CO2: 26 mEq/L (ref 19–32)
Calcium: 8.8 mg/dL (ref 8.4–10.5)
Chloride: 96 mEq/L (ref 96–112)
Creatinine, Ser: 0.78 mg/dL (ref 0.50–1.35)
GFR calc Af Amer: >90 mL/min (ref >90)
GFR calc non Af Amer: >90 mL/min (ref >90)
Glucose, Bld: 216 mg/dL — ABNORMAL HIGH (ref 70–99)
Potassium: 4 mEq/L (ref 3.5–5.1)
Sodium: 136 mEq/L (ref 135–145)
Total Bilirubin: 1.7 mg/dL — ABNORMAL HIGH (ref 0.3–1.2)
Total Protein: 6.5 g/dL (ref 6.0–8.3)

## 2012-01-31 LAB — CBC
HCT: 39.8 % (ref 39.0–52.0)
Hemoglobin: 14 g/dL (ref 13.0–17.0)
MCH: 29.2 pg (ref 26.0–34.0)
MCHC: 35.2 g/dL (ref 30.0–36.0)
MCV: 82.9 fL (ref 78.0–100.0)
Platelets: 217 10*3/uL (ref 150–400)
RBC: 4.8 MIL/uL (ref 4.22–5.81)
RDW: 13.4 % (ref 11.5–15.5)
WBC: 24.7 10*3/uL — ABNORMAL HIGH (ref 4.0–10.5)

## 2012-01-31 LAB — CHOLESTEROL, TOTAL: Cholesterol: 148 mg/dL (ref 0–200)

## 2012-01-31 LAB — TRIGLYCERIDES: Triglycerides: 146 mg/dL (ref <150)

## 2012-01-31 LAB — PHOSPHORUS: Phosphorus: 3.7 mg/dL (ref 2.3–4.6)

## 2012-01-31 LAB — URINE MICROSCOPIC-ADD ON

## 2012-01-31 LAB — PREALBUMIN: Prealbumin: 4.3 mg/dL — ABNORMAL LOW (ref 17.0–34.0)

## 2012-01-31 MED ORDER — HYDROMORPHONE 0.3 MG/ML IV SOLN
INTRAVENOUS | Status: AC
  Filled 2012-01-31: qty 25

## 2012-01-31 MED ORDER — PIPERACILLIN-TAZOBACTAM 3.375 G IVPB
3.3750 g | Freq: Three times a day (TID) | INTRAVENOUS | Status: DC
  Administered 2012-01-31 – 2012-02-02 (×6): 3.375 g via INTRAVENOUS
  Filled 2012-01-31 (×7): qty 50

## 2012-01-31 MED ORDER — IOHEXOL 300 MG/ML  SOLN: 80.0000 mL | Freq: Once | INTRAMUSCULAR | Status: AC | PRN

## 2012-01-31 MED ORDER — INSULIN GLARGINE 100 UNIT/ML ~~LOC~~ SOLN: 5.0000 [IU] | Freq: Every day | SUBCUTANEOUS | Status: DC

## 2012-01-31 MED ORDER — TRACE MINERALS CR-CU-MN-SE-ZN 10-1000-500-60 MCG/ML IV SOLN
INTRAVENOUS | Status: AC
  Administered 2012-01-31: 18:00:00 via INTRAVENOUS
  Filled 2012-01-31: qty 1000

## 2012-01-31 MED ORDER — IOHEXOL 300 MG/ML  SOLN
20.0000 mL | INTRAMUSCULAR | Status: AC
  Administered 2012-01-31 (×2): 20 mL via ORAL

## 2012-01-31 MED ORDER — FAT EMULSION 20 % IV EMUL
250.0000 mL | INTRAVENOUS | Status: AC
  Administered 2012-01-31: 250 mL via INTRAVENOUS
  Filled 2012-01-31: qty 250

## 2012-01-31 MED ORDER — INSULIN GLARGINE 100 UNIT/ML ~~LOC~~ SOLN
10.0000 [IU] | Freq: Every day | SUBCUTANEOUS | Status: DC
  Administered 2012-02-01 – 2012-02-03 (×3): 10 [IU] via SUBCUTANEOUS

## 2012-01-31 MED ORDER — POTASSIUM CHLORIDE IN NACL 20-0.9 MEQ/L-% IV SOLN
INTRAVENOUS | Status: DC
  Administered 2012-01-31: 12:00:00 via INTRAVENOUS
  Filled 2012-01-31 (×8): qty 1000

## 2012-01-31 NOTE — Progress Notes (Signed)
Medical Student Daily Progress Note  Subjective: Pt is a 46 yo male with a Hx of Crohn disease and diabetes, who presents with 1 day hx of abdominal pain and distention. CT is consistent with acute Crohn flare up. Pt had 7x flare ups in the past. Problem with med adherence due to socioeconomic reasons. 24 hr events:   - s/p ileocecectomy 3/12. POD6  - afebrile  - LLQ pain and bloating. Said severity is worse. Good NG output.             - still have not passed gas after surgery (endorses "almost" passed gas)  - endorses nausea that pt attributes to pain meds, no emesis.   - diet: TPN, ice chips, had ginger ale at bedside, said uses it to wash mouth but not swallow.  - foley out POD1 am, V/S. Endorses burning urination.  - ambulating.  - endorses CP on left anterior axillary line below nipple, and some SOB.     Objective: Vital signs in last 24 hours: Filed Vitals:   01/31/12 0000 01/31/12 0400 01/31/12 0649 01/31/12 0750  BP:   128/65   Pulse:   113   Temp:   98.7 F (37.1 C)   TempSrc:      Resp: 16 20 15 22   Height:      Weight:      SpO2: 96% 97% 95% 95%   Weight change:   Intake/Output Summary (Last 24 hours) at 01/31/12 0921 Last data filed at 01/31/12 0700  Gross per 24 hour  Intake   2130 ml  Output   1600 ml  Net    530 ml   Physical Exam:  Vitals reviewed. General: resting in bed, looks uncomfortable, but NAD. NG tube in. HEENT: NG in place. Brown drainage collected. Cardiac: tachycardic, RRR, no rubs, gallops. Flow murmur. Pulm: clear to auscultation bilaterally, no wheezes, rales, or rhonchi. Left anterior axillary line listened, no abnormal sounds. Tender to palp. Abd: BS active, minimally distended, tender at LLQ. Positive rebound tenderness. Dressing over midline incision intact, no leakage. Ext: warm and well perfused, no pedal edema, no tenderness, SCDs off.   Lab Results: Basic Metabolic Panel:  Lab 01/77/93 0635 01/30/12 0530 01/29/12 0600  Jared [name]  136 132* --  K 4.0 3.8 --  CL 96 95* --  CO2 26 27 --  GLUCOSE 216* 252* --  BUN 10 9 --  CREATININE 0.78 0.82 --  CALCIUM 8.8 8.6 --  MG 2.2 2.0 --  PHOS 3.7 -- 3.1   Liver Function Tests:  Lab 01/31/12 0635 01/29/12 0600  AST 18 10  ALT 16 13  ALKPHOS 93 65  BILITOT 1.7* 0.8  PROT 6.5 6.4  ALBUMIN 2.0* 2.4*   CBC:  Lab 01/31/12 0635 01/30/12 0530 01/29/12 0600  WBC 24.7* 15.5* --  NEUTROABS 20.8* -- 10.6*  HGB 14.0 14.0 --  HCT 39.8 40.2 --  MCV 82.9 83.4 --  PLT 217 261 --    CBG:  Lab 01/31/12 0820 01/31/12 0421 01/31/12 0019 01/30/12 2018 01/30/12 1625 01/30/12 1114  GLUCAP 237* 211* 166* 235* 260* 252*  Medications: I have reviewed the patient's current medications. Scheduled Meds:    . enoxaparin  40 mg Subcutaneous Q24H  . HYDROmorphone PCA 0.3 mg/mL   Intravenous Q4H  . HYDROmorphone PCA 0.3 mg/mL      . insulin aspart  0-20 Units Subcutaneous Q4H  . sodium chloride  500 mL Intravenous Once  . DISCONTD: insulin aspart  0-9 Units Subcutaneous Q4H   Continuous Infusions:    . 0.45 % NaCl with KCl 20 mEq / L 60 mL/hr at 01/31/12 0639  . TPN (CLINIMIX) +/- additives 40 mL/hr at 01/30/12 0600  . TPN (CLINIMIX) +/- additives 40 mL/hr at 01/31/12 0639   PRN Meds:.diphenhydrAMINE, diphenhydrAMINE, hydrALAZINE, ketorolac, menthol-cetylpyridinium, naloxone, ondansetron (ZOFRAN) IV, ondansetron, phenol, sodium chloride, sodium chloride Assessment/Plan:  46 year old man with past medical history of Crohn's disease for past 5 years and diabetes comes to the emergency department with what seems to be his usual Crohn's disease flareup and small bowel obstruction as a complication.  #1 Crohn's disease flareup and SBO  - Pt s/p ileocecectomy POD1. Endorses pain and continued distention. - H 113, R 22, afebrile - CBC: WBC 15.5 -> 24.7 , H/H normal. BMP: Jared [name] 136, K 4, Mg 2.2, BUN 10, Crt 0.78.    - For leukocytosis   - Per surg team: get CT of abd & pelvis to  eval for abscess. Start IV abx.   - get UA, blood cx, CXR  - continue PCA dilaudid.   - throat lozenges for discomfort 2/2 NG.  - TPN, ice chips, ADAT  - monitor for passage of gas and stool (none yet)  - NG tube in. Monitor lytes.  - encourage IS, OOB.  - steroid d/ced.  - f/u GI outpt after d/c  - will likely require chronic immunomodulator (he was on pentasa before, but had prob w med adherence, dz worsened as a result)  - PPD negative  - Last KUB on 3/11 shows obstruction is improving.  - C diff test negative  - 1/2NS w 12mq KCl 100cc/hr  - consider SW/CM involvement due to socioeconomic factors in med adherence.  - consider counselling for smoking cessation.  - Zofran for nausea and vomiting    #2 DM, type II  - CBG 237 - A1c 10.3, was 8.6 a year ago. - Novolog - Pharmacy will inc insulin given with TPN. - lantus d/ced - will Restart metformin and gliburide at discharge   #3 DVT PPX - Lovenox, SCDs    LOS: 11 days   This is a MCareers information officerNote. The care of the patient was discussed with Dr. LNicoletta Dressand the assessment and plan formulated with their assistance. Please see their attached note for official documentation of the daily encounter.   Jared Gurney3/18/2013, 9:21 AM  Resident Co-sign Daily Note: I have seen the patient and reviewed the daily progress note by LNicoletta DressMS 3 and discussed the care of the patient with them.  See below for documentation of my findings, assessment, and plans.  Subjective: Patient c/o mild abd pain.relatively controlled by PCA. NG suction dark green/brown fluid noted.  Objective: Vital signs in last 24 hours: Filed Vitals:   01/31/12 0750 01/31/12 1226 01/31/12 1450 01/31/12 1555  BP:   128/71   Pulse:   110   Temp:   98.3 F (36.8 C)   TempSrc:   Oral   Resp: 22 16 18 26   Height:      Weight:      SpO2: 95% 95% 98% 93%   Physical Exam:  Lab Results: Reviewed and documented in Electronic Record Micro Results: Reviewed and  documented in Electronic Record Studies/Results: Reviewed and documented in Electronic Record Medications: I have reviewed the patient's current medications. Scheduled Meds:   . enoxaparin  40 mg Subcutaneous Q24H  . HYDROmorphone PCA 0.3 mg/mL   Intravenous Q4H  .  HYDROmorphone PCA 0.3 mg/mL      . HYDROmorphone PCA 0.3 mg/mL      . insulin aspart  0-20 Units Subcutaneous Q4H  . insulin glargine  10 Units Subcutaneous Daily  . iohexol  20 mL Oral Q1 Hr x 2  . piperacillin-tazobactam (ZOSYN)  IV  3.375 g Intravenous Q8H  . DISCONTD: insulin glargine  5 Units Subcutaneous Daily   Continuous Infusions:   . 0.9 % NaCl with KCl 20 mEq / L 60 mL/hr at 01/31/12 1217  . fat emulsion    . TPN (CLINIMIX) +/- additives    . TPN (CLINIMIX) +/- additives 40 mL/hr at 01/30/12 0600  . TPN (CLINIMIX) +/- additives 40 mL/hr at 01/31/12 0639  . DISCONTD: 0.45 % NaCl with KCl 20 mEq / L 60 mL/hr at 01/31/12 0639   PRN Meds:.diphenhydrAMINE, diphenhydrAMINE, hydrALAZINE, iohexol, menthol-cetylpyridinium, naloxone, ondansetron (ZOFRAN) IV, ondansetron, phenol, sodium chloride, sodium chloride Assessment/Plan:  Crohn disease flareup and ABO  New leukocytosis noted. Afebrile  - continue NG suction, pain control, IVF  - monitor electrolytes imbalance  - Zosyn  -abd CT pending, see my note at 330pm on 3/18 - Surgical service f/u # hypokalemia  Replaced  Follow BMP   #2 DM, type II  - CBG 229  - A1c 10.3, was 8.6 a year ago.  - Novolog  - will Restart metformin and gliburide at discharge    LOS: 11 days   Jared [name] 01/31/2012, 5:20 PM

## 2012-01-31 NOTE — Progress Notes (Signed)
Internal Medicine Attending  Date: 01/31/2012  Patient name: Jabori Henegar Medical record number: [id number] Date of birth: 04-30-1966 Age: 46 y.o. Gender: male  I saw and evaluated the patient. I reviewed the resident's note by Dr.Li and I agree with the resident's findings and plans as documented in her progress note.  Mr.Herrbin was seen and examined this afternoon on rounds. He looks acutely ill and in mild discomfort. He just returned to his chair after getting up with physical therapy and walking the halls despite his discomfort. When the NG tube was placed back to suction a fair amount of dark brown fluid was aspirated into the wall canister. His abdominal exam shows some mild left-sided tenderness to palpation and hypoactive bowel sounds, some of which may have been related to the NG tube being placed back on suction. CT scan suggests possible peri anastomotic abscess not amenable to CT drainage. Surgery has evaluated Mr. Man and feel the best course at this point is to continue the IV antibiotics and to follow him clinically.  If there is no improvement or worsening in his clinical status over the next several days he will likely require intra-abdominal re-evaluation via surgery.  We will continue current supportive care.

## 2012-01-31 NOTE — Progress Notes (Signed)
6 Days Post-Op  Subjective: Complains of pain. mimimal flatus  Objective: Vital signs in last 24 hours: Temp:  [98.7 F (37.1 C)-99.1 F (37.3 C)] 98.7 F (37.1 C) (03/18 0649) Pulse Rate:  [113-125] 113  (03/18 0649) Resp:  [15-22] 22  (03/18 0750) BP: (127-129)/(60-70) 128/65 mmHg (03/18 0649) SpO2:  [95 %-97 %] 95 % (03/18 0750) Last BM Date: 01/23/12  Intake/Output from previous day: 03/17 0701 - 03/18 0700 In: 2130 [P.O.:480; I.V.:720; NG/GT:450; TPN:480] Out: 1600 [Urine:1300; Emesis/NG output:300] Intake/Output this shift:    Wound clean, no purulence or erythema Distended and tender abdomen  Lab Results:   Basename 01/31/12 0635 01/30/12 0530  WBC 24.7* 15.5*  HGB 14.0 14.0  HCT 39.8 40.2  PLT 217 261   BMET  Basename 01/31/12 0635 01/30/12 0530  NA 136 132*  K 4.0 3.8  CL 96 95*  CO2 26 27  GLUCOSE 216* 252*  BUN 10 9  CREATININE 0.78 0.82  CALCIUM 8.8 8.6   PT/INR No results found for this basename: LABPROT:2,INR:2 in the last 72 hours ABG No results found for this basename: PHART:2,PCO2:2,PO2:2,HCO3:2 in the last 72 hours  Studies/Results: No results found.  Anti-infectives: Anti-infectives     Start     Dose/Rate Route Frequency Ordered Stop   01/24/12 1722   ertapenem (INVANZ) 1 g in sodium chloride 0.9 % 50 mL IVPB        1 g 100 mL/hr over 30 Minutes Intravenous 60 min pre-op 01/24/12 1722 01/25/12 0948          Assessment/Plan: s/p Procedure(s) (LRB): EXPLORATORY LAPAROTOMY (N/A) SMALL BOWEL RESECTION (N/A)  WBC increasing.  Will get CT of abdomen and pelvis today to evaluate for abscess Start IV antibiotics  LOS: 11 days    Alyxis Grippi A 01/31/2012

## 2012-01-31 NOTE — Progress Notes (Signed)
OT Cancellation Note  Treatment cancelled today due to medical issues with patient which prohibited therapy. Pt not feeling well. N/V. Will attempt to complete eval tomorrow.  [place], OTR/L  [phone] 01/31/2012 01/31/2012, 3:28 PM

## 2012-01-31 NOTE — Progress Notes (Signed)
Physical Therapy Evaluation Patient Details Name: Jared [name] MRN: [id number] DOB: 1966-04-27 Today's Date: 01/31/2012  Problem List:  Patient Active Problem List  Diagnoses  . CD (Crohn's disease)  . DM (diabetes mellitus)    Past Medical History:  Past Medical History  Diagnosis Date  . Crohn disease 2008    with hospital admission in 2011, and 8/12 for flare up and SBO relieved with bowel rest. no suregery, no meds for CD  . Diabetes mellitus type II    Past Surgical History:  Past Surgical History  Procedure Date  . 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 Scrotal surgery ~ 1971    "took out a pellet"  . Laparotomy 01/25/2012    Procedure: EXPLORATORY LAPAROTOMY;  Surgeon: Jared Faster. Cornett, MD;  Location: [facility];  Service: General;  Laterality: N/A;  . Bowel resection 01/25/2012    Procedure: SMALL BOWEL RESECTION;  Surgeon: Jared Faster. Cornett, MD;  Location: [facility];  Service: General;  Laterality: N/A;    PT Assessment/Plan/Recommendation PT Assessment Clinical Impression Statement: 46 yo male s/p small bowel resection who has been slow to progress with mobility presents with decr functional mobility, decr activity tolerance; will benefit from PT to maximize independence and safety with mobility/amb/activity tol and enable safe dc home PT Recommendation/Assessment: Patient will need skilled PT in the acute care venue PT Problem List: Decreased strength;Decreased range of motion;Decreased activity tolerance;Decreased mobility;Decreased coordination;Decreased knowledge of use of DME;Pain PT Therapy Diagnosis : Difficulty walking;Acute pain;Generalized weakness PT Plan PT Frequency: Min 3X/week PT Treatment/Interventions: DME instruction;Gait training;Stair training;Functional mobility training;Therapeutic activities;Therapeutic exercise;Patient/family education PT Recommendation Recommendations for Other Services: OT consult Follow Up Recommendations: Home  health PT;Supervision - Intermittent Equipment Recommended: Rolling walker with 5" wheels;3 in 1 bedside comode (May progress to not needing these) PT Goals  Acute Rehab PT Goals PT Goal Formulation: With patient Time For Goal Achievement: 2 weeks Pt will go Supine/Side to Sit: with modified independence PT Goal: Supine/Side to Sit - Progress: Goal set today Pt will go Sit to Supine/Side: with modified independence PT Goal: Sit to Supine/Side - Progress: Goal set today Pt will go Sit to Stand: with modified independence PT Goal: Sit to Stand - Progress: Goal set today Pt will go Stand to Sit: with modified independence PT Goal: Stand to Sit - Progress: Goal set today Pt will Ambulate: >150 feet;with modified independence;with least restrictive assistive device PT Goal: Ambulate - Progress: Goal set today Pt will Go Up / Down Stairs: 3-5 stairs;with modified independence;with rail(s) PT Goal: Up/Down Stairs - Progress: Goal set today  PT Evaluation Precautions/Restrictions  Precautions Precaution Comments: NG tube Prior Functioning  Home Living Lives With: Alone Receives Help From: Family (Sister plans to come stay wih pt) Type of Home: House Home Layout: One level Home Access: Stairs to enter Entrance Stairs-Rails: None Entrance Stairs-Number of Steps: 2 Prior Function Level of Independence: Independent with homemaking with ambulation Driving: Yes Vocation: Full time employment Cognition Cognition Arousal/Alertness: Awake/alert Overall Cognitive Status: Appears within functional limits for tasks assessed Orientation Level: Oriented X4 Sensation/Coordination Coordination Gross Motor Movements are Fluid and Coordinated: No Coordination and Movement Description: Limited by pain Extremity Assessment RUE Assessment RUE Assessment: Within Functional Limits LUE Assessment LUE Assessment: Within Functional Limits RLE Assessment RLE Assessment:  (generally weak, requiring mod  assist for sit to stand) LLE Assessment LLE Assessment:  (generally weak, requiring mod assist for sit to stand) Mobility (including Balance) Bed Mobility Bed  Mobility: Yes Rolling Left: 4: Min assist Rolling Left Details (indicate cue type and reason): cues for technique, use of rails to minimize abdominal discomfort Left Sidelying to Sit: 3: Mod assist Left Sidelying to Sit Details (indicate cue type and reason): cues for technique Sitting - Scoot to Edge of Bed: 4: Min assist Sitting - Scoot to [place] of Bed Details (indicate cue type and reason): cues for technique; very good use of bil UEs to unweigh hips to scoot to EOB Transfers Transfers: Yes Sit to Stand: 3: Mod assist;With upper extremity assist;From bed Sit to Stand Details (indicate cue type and reason): cues for safe technique and to initiate Stand to Sit: 1: +2 Total assist;Patient percentage (comment);To chair/3-in-1 (Pt= 60%) Stand to Sit Details: Plus 2 for safety, as pt began heaving/resembling vomiting with whole body shuddering, so pulled chair behind pt to sit down quickly  Ambulation/Gait Ambulation/Gait: Yes Ambulation/Gait Assistance: 4: Min assist (second person for many lines (2 IV poles)) Ambulation/Gait Assistance Details (indicate cue type and reason): cues for RW proximity, posture; overall did well, however amb ended early secondary to heaving/vomiting-like action; RN aware Ambulation Distance (Feet): 45 Feet Assistive device: Rolling walker Gait Pattern: Step-through pattern Gait velocity: slow Stairs: No        End of Session PT - End of Session Activity Tolerance:  (limited by nausea/dry-heaving) Patient left: in chair;with call bell in reach (RN with pt) Nurse Communication: Mobility status for ambulation General Behavior During Session: [facility] for tasks performed Cognition: [facility] for tasks performed  Jared [name] Jared [name] [facility], [place]  01/31/2012, 5:11 PM

## 2012-01-31 NOTE — Progress Notes (Signed)
S: Patient still c/o abd pain relatively controlled with PCA pump O: General: acute ill appearing      Lungs Bibasilar lung sounds diminished. No rales or wheezing.      Heart: ST, No M/G/R      Abd: still tender to touch, LLL rebound tenderness. abd distension noted. A/P -Discussed with Radiologist about patient Abd CT findings>>>Postive fluid collection>>>?abcess - Surgeon Dr, Rush Farmer was notified by radiologist - Zosyn started today. - will follow

## 2012-01-31 NOTE — Progress Notes (Signed)
PARENTERAL NUTRITION CONSULT NOTE - FOLLOW UP  Pharmacy Consult for TPN Indication: post-op ileus s/p SBR  No Known Allergies  Patient Measurements: Height: 6' 2"  (188 cm) Weight: 210 lb (95.255 kg) IBW/kg (Calculated) : 82.2   Vital Signs: Temp: 98.7 F (37.1 C) (03/18 0649) BP: 128/65 mmHg (03/18 0649) Pulse Rate: 113  (03/18 0649) Intake/Output from previous day: 03/17 0701 - 03/18 0700 In: 2130 [P.O.:480; I.V.:720; NG/GT:450; TPN:480] Out: 1600 [Urine:1300; Emesis/NG output:300] Intake/Output from this shift:    Labs:  [facility] 01/31/12 0635 01/30/12 0530 01/29/12 0600  WBC 24.7* 15.5* 13.6*  HGB 14.0 14.0 14.3  HCT 39.8 40.2 42.2  PLT 217 261 288  APTT -- -- --  INR -- -- --     Basename 01/31/12 0635 01/30/12 0530 01/29/12 0600  NA 136 132* 133*  K 4.0 3.8 3.2*  CL 96 95* 93*  CO2 26 27 27   GLUCOSE 216* 252* 220*  BUN 10 9 9   CREATININE 0.78 0.82 0.79  LABCREA -- -- --  CREAT24HRUR -- -- --  CALCIUM 8.8 8.6 8.6  MG 2.2 2.0 2.0  PHOS 3.7 -- 3.1  PROT 6.5 -- 6.4  ALBUMIN 2.0* -- 2.4*  AST 18 -- 10  ALT 16 -- 13  ALKPHOS 93 -- 65  BILITOT 1.7* -- 0.8  BILIDIR -- -- --  IBILI -- -- --  PREALBUMIN -- -- 8.0*  TRIG 146 -- 167*  CHOLHDL -- -- --  CHOL 148 -- 158   Estimated Creatinine Clearance: 135.6 ml/min (by C-G formula based on Cr of 0.78).    Basename 01/31/12 0820 01/31/12 0421 01/31/12 0019  GLUCAP 237* 211* 166*    Medications:  Scheduled:     . enoxaparin  40 mg Subcutaneous Q24H  . HYDROmorphone PCA 0.3 mg/mL   Intravenous Q4H  . HYDROmorphone PCA 0.3 mg/mL      . insulin aspart  0-20 Units Subcutaneous Q4H  . piperacillin-tazobactam (ZOSYN)  IV  3.375 g Intravenous Q8H  . sodium chloride  500 mL Intravenous Once  . DISCONTD: insulin aspart  0-9 Units Subcutaneous Q4H    Insulin Requirements in the past 24 hours:  29 units SSI (scale changed to resistant)  Nutritional Goals:  2100-2300 kcal, 115-130 gm protein  daily  Assessment: 4 YOM with post-op ileus s/p SBR (POD#6).  Patient to continue on TPN. - Lytes all WNL - Endo:  CBGs uncontrolled with 50 units insulin in TPN.  Will increase to max amount today and add Lantus - Renal:  SCr stable, decent UOP, 45NS with 20 mEq KCL at 60 ml/hr - LFTs / tbili / TG / TC WNL   Plan:  - Continue Clinimix E 5/15 at 40 ml/hr, will not advance to goal rate d/t elevated CBGs - Lipids 10 ml/hr on MWF d/t national shortage - Multivitamins and trace elements on MWF - Increase insulin to 65 units in TPN and add Lantus 5 units SQ daily - Adjust IVF to prevent hyponatremia (keep KCL at 38mq/L and rate at 60 ml/hr) - F/U daily    TJohnnette Gourd PharmD 01/31/2012,10:08 AM

## 2012-01-31 NOTE — Progress Notes (Signed)
ANTIBIOTIC CONSULT NOTE - INITIAL  Pharmacy Consult for Zosyn Indication: r/o intraabdominal infection/abscess  No Known Allergies  Patient Measurements: Height: 6' 2"  (188 cm) Weight: 210 lb (95.255 kg) IBW/kg (Calculated) : 82.2    Vital Signs: Temp: 98.7 F (37.1 C) (03/18 0649) BP: 128/65 mmHg (03/18 0649) Pulse Rate: 113  (03/18 0649) Intake/Output from previous day: 03/17 0701 - 03/18 0700 In: 2130 [P.O.:480; I.V.:720; NG/GT:450; TPN:480] Out: 1600 [Urine:1300; Emesis/NG output:300] Intake/Output from this shift:    Labs:  Basename 01/31/12 0635 01/30/12 0530 01/29/12 0600  WBC 24.7* 15.5* 13.6*  HGB 14.0 14.0 14.3  PLT 217 261 288  LABCREA -- -- --  CREATININE 0.78 0.82 0.79   Estimated Creatinine Clearance: 135.6 ml/min (by C-G formula based on Cr of 0.78). No results found for this basename: VANCOTROUGH:2,VANCOPEAK:2,VANCORANDOM:2,GENTTROUGH:2,GENTPEAK:2,GENTRANDOM:2,TOBRATROUGH:2,TOBRAPEAK:2,TOBRARND:2,AMIKACINPEAK:2,AMIKACINTROU:2,AMIKACIN:2, in the last 72 hours   Microbiology: Recent Results (from the past 720 hour(s))  CLOSTRIDIUM DIFFICILE BY PCR     Status: Normal   Collection Time   01/22/12  7:38 PM      Component Value Range Status Comment   C difficile by pcr NEGATIVE  NEGATIVE  Final   SURGICAL PCR SCREEN     Status: Normal   Collection Time   01/24/12  8:45 PM      Component Value Range Status Comment   MRSA, PCR NEGATIVE  NEGATIVE  Final    Staphylococcus aureus NEGATIVE  NEGATIVE  Final     Medical History: Past Medical History  Diagnosis Date  . Crohn disease 2008    with hospital admission in 2011, and 8/12 for flare up and SBO relieved with bowel rest. no suregery, no meds for CD  . Diabetes mellitus type II     Medications:  Prescriptions prior to admission  Medication Sig Dispense Refill  . glyBURIDE (DIABETA) 5 MG tablet Take 5 mg by mouth daily with breakfast.      . metFORMIN (GLUCOPHAGE) 500 MG tablet Take 500 mg by mouth 2  (two) times daily with a meal.       Assessment: 46 yo man with h/o Chron's disease s/p small bowel resection with increasing WBC to start empiric zosyn r/o intraabdominal infection.  CrCl >100 ml/min.  Goal of Therapy:  Appropriate antibiotic therapy  Plan:  Zosyn 3.375 gm IV q8 hours. F/u CT results  Excell Seltzer Poteet 01/31/2012,10:03 AM

## 2012-01-31 NOTE — Progress Notes (Signed)
Patient ID: Jared [name], male   DOB: Oct 31, 1966, 46 y.o.   MRN: [id number]   CT shows fluid around anastomosis consistent with developing abscess.  Does not appear to be leaking contrast.  Not a clear window to drain with CT guidance.  Patient with mildly tender abdomen.  Will continue antibiotics and repeat CT in a few days unless he worsens.  Then, wound have to proceed with laparotomy

## 2012-02-01 LAB — CBC
HCT: 40 % (ref 39.0–52.0)
Hemoglobin: 13.8 g/dL (ref 13.0–17.0)
MCH: 28.9 pg (ref 26.0–34.0)
MCHC: 34.5 g/dL (ref 30.0–36.0)
MCV: 83.7 fL (ref 78.0–100.0)
Platelets: 271 10*3/uL (ref 150–400)
RBC: 4.78 MIL/uL (ref 4.22–5.81)
RDW: 13.7 % (ref 11.5–15.5)
WBC: 17.5 10*3/uL — ABNORMAL HIGH (ref 4.0–10.5)

## 2012-02-01 LAB — GLUCOSE, CAPILLARY
Glucose-Capillary: 189 mg/dL — ABNORMAL HIGH (ref 70–99)
Glucose-Capillary: 138 mg/dL — ABNORMAL HIGH (ref 70–99)
Glucose-Capillary: 126 mg/dL — ABNORMAL HIGH (ref 70–99)
Glucose-Capillary: 120 mg/dL — ABNORMAL HIGH (ref 70–99)

## 2012-02-01 LAB — BASIC METABOLIC PANEL
BUN: 12 mg/dL (ref 6–23)
CO2: 32 mEq/L (ref 19–32)
Calcium: 8.7 mg/dL (ref 8.4–10.5)
Chloride: 94 mEq/L — ABNORMAL LOW (ref 96–112)
Creatinine, Ser: 0.87 mg/dL (ref 0.50–1.35)
GFR calc Af Amer: >90 mL/min (ref >90)
GFR calc non Af Amer: >90 mL/min (ref >90)
Glucose, Bld: 185 mg/dL — ABNORMAL HIGH (ref 70–99)
Potassium: 3.2 mEq/L — ABNORMAL LOW (ref 3.5–5.1)
Sodium: 138 mEq/L (ref 135–145)

## 2012-02-01 LAB — MAGNESIUM: Magnesium: 2.3 mg/dL (ref 1.5–2.5)

## 2012-02-01 MED ORDER — POTASSIUM CHLORIDE 10 MEQ/100ML IV SOLN
10.0000 meq | INTRAVENOUS | Status: AC
  Administered 2012-02-01 (×4): 10 meq via INTRAVENOUS
  Filled 2012-02-01 (×4): qty 100

## 2012-02-01 MED ORDER — IOHEXOL 300 MG/ML  SOLN: 80.0000 mL | Freq: Once | INTRAMUSCULAR | Status: AC | PRN

## 2012-02-01 MED ORDER — INSULIN REGULAR HUMAN 100 UNIT/ML IJ SOLN
INTRAVENOUS | Status: AC
  Administered 2012-02-01: 18:00:00 via INTRAVENOUS
  Filled 2012-02-01: qty 2000

## 2012-02-01 MED ORDER — HYDROMORPHONE 0.3 MG/ML IV SOLN
INTRAVENOUS | Status: AC
  Administered 2012-02-01: 16:00:00
  Filled 2012-02-01: qty 25

## 2012-02-01 NOTE — Progress Notes (Addendum)
Subjective:  Patient states that he pass minimum flatulence  4 time yesterday. He still reports generalized abd pain especially in LLQ. States that his abd pain only partially relieved by PCA.  Patient states that he vomited a few times last night. Per nursing report, NG tube suction > 800 ml last night  Objective: Vital signs in last 24 hours: Filed Vitals:   01/31/12 1555 01/31/12 2149 02/01/12 0017 02/01/12 0606  BP:  124/78  118/77  Pulse:  115  114  Temp:  97.3 F (36.3 C)  97.9 F (36.6 C)  TempSrc:      Resp: 26 20  20   Height:      Weight:      SpO2: 93% 97% 96% 96%   Weight change:   Intake/Output Summary (Last 24 hours) at 02/01/12 0819 Last data filed at 02/01/12 0600  Gross per 24 hour  Intake   3225 ml  Output   3500 ml  Net   -275 ml   Physical Exam: General: Acute ill appearing Nose: NG tube with suction, dark brownish output noted. Lungs: Bibasilar lung sounds diminished. No wheezing, rales or rhonchi noted. Heart: RRR, Sinus tachycardia HR 110's, no M/G/R Abdomen: soft, tenderness noted at LLQ with ? Rebound tenderness. Mild to moderate distension noted. No BS  Ext: No edema. SCDs on  Lab Results: Basic Metabolic Panel:  Lab 22/48/25 0550 01/31/12 0635 01/29/12 0600  Breindel Collier 138 136 --  K 3.2* 4.0 --  CL 94* 96 --  CO2 32 26 --  GLUCOSE 185* 216* --  BUN 12 10 --  CREATININE 0.87 0.78 --  CALCIUM 8.7 8.8 --  MG 2.3 2.2 --  PHOS -- 3.7 3.1   Liver Function Tests:  Lab 01/31/12 0635 01/29/12 0600  AST 18 10  ALT 16 13  ALKPHOS 93 65  BILITOT 1.7* 0.8  PROT 6.5 6.4  ALBUMIN 2.0* 2.4*   CBC:  Lab 02/01/12 0550 01/31/12 0635 01/29/12 0600  WBC 17.5* 24.7* --  NEUTROABS -- 20.8* 10.6*  HGB 13.8 14.0 --  HCT 40.0 39.8 --  MCV 83.7 82.9 --  PLT 271 217 --   CBG:  Lab 02/01/12 0516 01/31/12 2134 01/31/12 1649 01/31/12 1120 01/31/12 0820 01/31/12 0421  GLUCAP 189* 173* 214* 262* 237* 211*   Fasting Lipid Panel:  Lab 01/31/12 0635    CHOL 148  HDL --  LDLCALC --  TRIG 146  CHOLHDL --  LDLDIRECT --   Urinalysis:  Lab 01/31/12 1116  COLORURINE ORANGE*  LABSPEC 1.035*  PHURINE 6.0  GLUCOSEU >1000*  HGBUR NEGATIVE  BILIRUBINUR MODERATE*  KETONESUR >80*  PROTEINUR 30*  UROBILINOGEN 2.0*  NITRITE POSITIVE*  LEUKOCYTESUR SMALL*    Micro Results: Recent Results (from the past 240 hour(s))  CLOSTRIDIUM DIFFICILE BY PCR     Status: Normal   Collection Time   01/22/12  7:38 PM      Component Value Range Status Comment   C difficile by pcr NEGATIVE  NEGATIVE  Final   SURGICAL PCR SCREEN     Status: Normal   Collection Time   01/24/12  8:45 PM      Component Value Range Status Comment   MRSA, PCR NEGATIVE  NEGATIVE  Final    Staphylococcus aureus NEGATIVE  NEGATIVE  Final   CULTURE, BLOOD (ROUTINE X 2)     Status: Normal (Preliminary result)   Collection Time   01/31/12 10:51 AM      Component Value Range  Status Comment   Specimen Description BLOOD LEFT ARM   Final    Special Requests     Final    Value: BOTTLES DRAWN AEROBIC AND ANAEROBIC AERO 10CC ANA 5CC   Culture  Setup Time [id number]   Final    Culture     Final    Value:        BLOOD CULTURE RECEIVED NO GROWTH TO DATE CULTURE WILL BE HELD FOR 5 DAYS BEFORE ISSUING A FINAL NEGATIVE REPORT   Report Status PENDING   Incomplete   CULTURE, BLOOD (ROUTINE X 2)     Status: Normal (Preliminary result)   Collection Time   01/31/12 10:56 AM      Component Value Range Status Comment   Specimen Description BLOOD LEFT HAND   Final    Special Requests BOTTLES DRAWN AEROBIC ONLY 5CC   Final    Culture  Setup Time [id number]   Final    Culture     Final    Value:        BLOOD CULTURE RECEIVED NO GROWTH TO DATE CULTURE WILL BE HELD FOR 5 DAYS BEFORE ISSUING A FINAL NEGATIVE REPORT   Report Status PENDING   Incomplete    Studies/Results: Ct Abdomen Pelvis W Contrast  01/31/2012  *RADIOLOGY REPORT*  Clinical Data: Small bowel resection last week secondary to  obstruction.  History of Crohn's disease.  Lower abdominal pain. Elevated white blood cell count.  CT ABDOMEN AND PELVIS WITH CONTRAST  Technique:  Multidetector CT imaging of the abdomen and pelvis was performed following the standard protocol during bolus administration of intravenous contrast.  Contrast:  80 ml Omnipaque-300  Comparison: Plain films of 01/24/2012.  CT of 01/20/2012.  Findings: Patchy right greater than left bibasilar atelectasis. Normal heart size with trace right-sided pleural fluid.  Mild degradation secondary patient arm position, not raised above the head.  Normal liver.  Small volume perihepatic ascites.  A splenule.  Normal stomach, pancreas, gallbladder, biliary tract, adrenal glands.  Too small to characterize left renal lesions.  Small retroperitoneal nodes which are unchanged and likely reactive.  Similar prominent gastrohepatic ligament nodes.  Under distended sigmoid colon.  Surgical sutures in the region of the terminal ileum and cecum.  The neoterminal ileum is underdistended.  Prominent jejunal mesenteric nodes, also likely reactive.  No free intraperitoneal air.  A multiloculated fluid gas collection is identified within the leaves of the ileocolic mesentery.  This is somewhat ill-defined superiorly on image 50, but better defined more inferiorly on image 58, measures 5.3 x 3.2 cm.  It is contiguous with a central left paracentral fluid collection which measures 5.9 x 3.7 cm on image 67.  This has increased density dependently, including on images 59 and 68.  Mild surrounding mesenteric edema.  No spill of contrast in this collection.  No pelvic adenopathy.    Normal urinary bladder and prostate. Small volume complex cul-de-sac fluid on image 88.  This is hyperattenuating dependently.  Midline laparotomy changes.  Sacroiliac joint partial fusion may relate to sacroiliitis.  IMPRESSION:  1.  Complex fluid and gas collection within the ileocolic mesentery.  Increased density within.   Suspicious for developing abscess or infected hematoma.  Increased density within is favored to represent blood products.  Enteric contrast within could look similar.  Case discussed with Dr. Ninfa Linden, at 325pm. 2.  Complex fluid within the pelvic cul-de-sac.  Also either blood products or contrast.  No surrounding inflammation or enhancement to suggest developing  abscess. 3.  Bibasilar atelectasis with trace right pleural fluid. 4.  Small volume perihepatic ascites. 5.  Ileocolic anastomoses with surrounding postoperative edema. 6.  Borderline abdominal adenopathy, similar and likely reactive.  Original Report Authenticated By: Areta Haber, M.D.   Medications: I have reviewed the patient's current medications. Scheduled Meds:   . enoxaparin  40 mg Subcutaneous Q24H  . HYDROmorphone PCA 0.3 mg/mL   Intravenous Q4H  . HYDROmorphone PCA 0.3 mg/mL      . insulin aspart  0-20 Units Subcutaneous Q4H  . insulin glargine  10 Units Subcutaneous Daily  . iohexol  20 mL Oral Q1 Hr x 2  . piperacillin-tazobactam (ZOSYN)  IV  3.375 g Intravenous Q8H  . DISCONTD: insulin glargine  5 Units Subcutaneous Daily   Continuous Infusions:   . 0.9 % NaCl with KCl 20 mEq / L 60 mL/hr at 01/31/12 1217  . fat emulsion 250 mL (01/31/12 1742)  . TPN (CLINIMIX) +/- additives 40 mL/hr at 01/31/12 1741  . TPN (CLINIMIX) +/- additives 40 mL/hr at 01/31/12 0639  . DISCONTD: 0.45 % NaCl with KCl 20 mEq / L 60 mL/hr at 01/31/12 0639   PRN Meds:.diphenhydrAMINE, diphenhydrAMINE, hydrALAZINE, iohexol, iohexol, menthol-cetylpyridinium, naloxone, ondansetron (ZOFRAN) IV, ondansetron, phenol, sodium chloride, sodium chloride Assessment/Plan:  # Sepsis    The clinical manifestation is consistent with Sepsis including SIRS symptoms and new-discovered abscess vs infected hematoma.  Surgeon Dr. Rush Farmer aware of Abd CT findings. No surgery suggested for now.  Plan - Follow up with blood cultures - IVF hydration - Broaden  spectrum ABX Zosyn started on 3/18 - monitor him closely for s/s severe sepsis or septic shock. Given his hospital-acquired intra abd abscess, may consider Vancomycin if he is    not clinical improving within next 1-2 days. - low threshold for transfer to ICU should he becomes hemodynamically unstable  - surgical service to follow  # Abd abscess s/p ileocecectomy 3/12 POD day #7   Dr. Rush Farmer aware of abd CT findings. - will follow   # Pyuria    Foley D/C'd POD#1, Patient c/o urinary burning sensation and UA showed positive Nitrates, small leukocytes, WBC 02/HPF  -will obtain UCx - on Zosyn  # Ketonuria    No Acidosis or gap, likely starvation induced. TPN just started. Will monitor.     # Hypokalemia, hypomagnesemia    High risk for above problems due to n.p.o. status, NG tube suctioning, and TPN infusion. -Will monitor closely -K. is 3.2 today, will replace with 4 rounds IV potassium -Magnesium 2.3  # DM, type II  -Oral anti-hyperglycemics medications are on hold since admission -NPO andTPN started -Monitor CBGs -Lantus 10 units daily  # Crohn disease    Patient has had 5 years of Crohn's disease and presented with flareup on this admission.  Status post ileocecectomy on 3/12. -Will discuss with GI Dr. Paulita Fujita for outpatient medical management once his acute issue resolved    # VTE: SCDs   LOS: 12 days   Lonza Shimabukuro 02/01/2012, 8:19 AM

## 2012-02-01 NOTE — Progress Notes (Signed)
I have seen and examined the patient and agree with the assessment and plans.  WBC improved.  Still may need to be explored.  Hayes Czaja A. Ninfa Linden  MD, FACS

## 2012-02-01 NOTE — Progress Notes (Signed)
Resident Co-sign Daily Note: I have seen the patient and reviewed the daily progress note by Nicoletta Dress MS 3 and discussed the care of the patient with them.  See below for documentation of my findings, assessment, and plans. Please see my note  Medical Student Daily Progress Note  Subjective: Pt is a 46 yo male with a Hx of Crohn disease and diabetes, who presents with 1 day hx of abdominal pain and distention. CT is consistent with acute Crohn flare up. Pt had 7x flare ups in the past. Problem with med adherence due to socioeconomic reasons. - s/p ileocecectomy 3/12. POD7 24 hr events: CT shows abscess, started IV zosyn, WBC 24.7 -> 17.5   - afebrile  - LLQ pain and bloating. Sx better this am after PCA use.             - passed flatus yesterday.  - endorses nausea and emesis x 8 last night.   - still endorses HA and throat discomfort from NG.  - diet: TPN, ice chips  - foley out POD1 am, V/S. Endorses burning urination.  - ambulating.      Objective: Vital signs in last 24 hours: Filed Vitals:   01/31/12 1555 01/31/12 2149 02/01/12 0017 02/01/12 0606  BP:  124/78  118/77  Pulse:  115  114  Temp:  97.3 F (36.3 C)  97.9 F (36.6 C)  TempSrc:      Resp: 26 20  20   Height:      Weight:      SpO2: 93% 97% 96% 96%   Weight change:   Intake/Output Summary (Last 24 hours) at 02/01/12 0854 Last data filed at 02/01/12 0600  Gross per 24 hour  Intake   3225 ml  Output   3500 ml  Net   -275 ml   Physical Exam:  Vitals reviewed. General: resting in bed, looks uncomfortable, but NAD. HEENT: NG in place, good output. Brown drainage collected. Cardiac: tachycardic, RRR, no rubs, gallops.  Pulm: clear to auscultation bilaterally, no wheezes, rales, or rhonchi.  Abd: BS quiet, minimally distended, tender at LLQ. No rebound tenderness. Dressing over midline incision intact, no leakage. Ext: warm and well perfused, no pedal edema, no tenderness, SCDs intermittently taken off.   Lab  Results: Basic Metabolic Panel:  Lab 96/04/54 0550 01/31/12 0635 01/29/12 0600  Jamica Woodyard 138 136 --  K 3.2* 4.0 --  CL 94* 96 --  CO2 32 26 --  GLUCOSE 185* 216* --  BUN 12 10 --  CREATININE 0.87 0.78 --  CALCIUM 8.7 8.8 --  MG 2.3 2.2 --  PHOS -- 3.7 3.1   Liver Function Tests:  Lab 01/31/12 0635 01/29/12 0600  AST 18 10  ALT 16 13  ALKPHOS 93 65  BILITOT 1.7* 0.8  PROT 6.5 6.4  ALBUMIN 2.0* 2.4*   CBC:  Lab 02/01/12 0550 01/31/12 0635 01/29/12 0600  WBC 17.5* 24.7* --  NEUTROABS -- 20.8* 10.6*  HGB 13.8 14.0 --  HCT 40.0 39.8 --  MCV 83.7 82.9 --  PLT 271 217 --    CBG:  Lab 02/01/12 0516 01/31/12 2134 01/31/12 1649 01/31/12 1120 01/31/12 0820 01/31/12 0421  GLUCAP 189* 173* 214* 262* 237* 211*  Medications: I have reviewed the patient's current medications. Scheduled Meds:    . enoxaparin  40 mg Subcutaneous Q24H  . HYDROmorphone PCA 0.3 mg/mL   Intravenous Q4H  . HYDROmorphone PCA 0.3 mg/mL      . insulin aspart  0-20 Units Subcutaneous Q4H  . insulin glargine  10 Units Subcutaneous Daily  . iohexol  20 mL Oral Q1 Hr x 2  . piperacillin-tazobactam (ZOSYN)  IV  3.375 g Intravenous Q8H  . potassium chloride  10 mEq Intravenous Q1 Hr x 4  . DISCONTD: insulin glargine  5 Units Subcutaneous Daily   Continuous Infusions:    . 0.9 % NaCl with KCl 20 mEq / L 60 mL/hr at 01/31/12 1217  . fat emulsion 250 mL (01/31/12 1742)  . TPN (CLINIMIX) +/- additives 40 mL/hr at 01/31/12 1741  . TPN (CLINIMIX) +/- additives    . TPN (CLINIMIX) +/- additives 40 mL/hr at 01/31/12 0639  . DISCONTD: 0.45 % NaCl with KCl 20 mEq / L 60 mL/hr at 01/31/12 0639   PRN Meds:.diphenhydrAMINE, diphenhydrAMINE, hydrALAZINE, iohexol, iohexol, menthol-cetylpyridinium, naloxone, ondansetron (ZOFRAN) IV, ondansetron, phenol, sodium chloride, sodium chloride Assessment/Plan:  46 year old man with past medical history of Crohn's disease for past 5 years and diabetes comes to the emergency  department with what seems to be his usual Crohn's disease flareup and small bowel obstruction as a complication.  #1 Crohn's disease flareup and SBO  - Pt s/p ileocecectomy POD7. Endorses pain and continued distention. - H 114, R 22, afebrile, BP good - CBC: WBC 24.7 -> 17.5 , H/H normal. BMP: Merrick Feutz 136, K 3.2, Mg 2.3    - CT demonstrated abscess. No window for perc drainage. Pt on Zosyn, repeat CT in a few days. If not resolved, then back to OR.  - TPN, NG, IV fluids  - blood cx no growth day 1.  - continue PCA dilaudid.   - throat lozenges for discomfort 2/2 NG.  - TPN, ice chips, ADAT  - passed gas yesterday.  - NG tube in. K low this am, repleted x 4.  - encourage IS, OOB.  - steroid d/ced.  - f/u GI outpt after d/c  - will likely require chronic immunomodulator (he was on pentasa before, but had prob w med adherence, dz worsened as a result)  - PPD negative  - Last KUB on 3/11 shows obstruction is improving.  - C diff test negative  - 1/2NS w 65mq KCl 100cc/hr  - consider SW/CM involvement due to socioeconomic factors in med adherence.  - consider counselling for smoking cessation.  - Zofran for nausea and vomiting    #2 DM, type II  - CBG 237 - A1c 10.3, was 8.6 a year ago. - Novolog - [facility]acy [facility] insulin given with TPN 40->65 U. - lantus restarted, 5 U sq daily - will Restart metformin and gliburide at discharge   #3 DVT PPX - Lovenox, SCDs    LOS: 12 days   This is a MCareers information officerNote. The care of the patient was discussed with Dr. LNicoletta Dressand the assessment and plan formulated with their assistance. Please see their attached note for official documentation of the daily encounter.   LAnola Gurney3/19/2013, 8:54 AM

## 2012-02-01 NOTE — Progress Notes (Signed)
Occupational Therapy Evaluation Patient Details Name: Nolen Lindamood MRN: [id number] DOB: 02/08/1966 Today's Date: 02/01/2012  Problem List:  Patient Active Problem List  Diagnoses  . CD (Crohn's disease)  . DM (diabetes mellitus)    Past Medical History:  Past Medical History  Diagnosis Date  . Crohn disease 2008    with hospital admission in 2011, and 8/12 for flare up and SBO relieved with bowel rest. no suregery, no meds for CD  . Diabetes mellitus type II    Past Surgical History:  Past Surgical History  Procedure Date  . 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 Scrotal surgery ~ 1971    "took out a pellet"  . Laparotomy 01/25/2012    Procedure: EXPLORATORY LAPAROTOMY;  Surgeon: [name], MD;  Location: [facility];  Service: General;  Laterality: N/A;  . Bowel resection 01/25/2012    Procedure: SMALL BOWEL RESECTION;  Surgeon: [name], MD;  Location: [facility];  Service: General;  Laterality: N/A;    OT Assessment/Plan/Recommendation OT Assessment Clinical Impression Statement: 46 yo s/p small bowel resection. Pt presents with the below noted deficits and will benefit from skilled OT services to increase strength and endurance to max indep with ADL and mobility for ADL with nec AE and DME to facilitate safe D/C home. Pt will most likely not need OT after D/C, pending progress. OT Recommendation/Assessment: Patient will need skilled OT in the acute care venue OT Problem List: Decreased strength;Decreased activity tolerance;Decreased knowledge of use of DME or AE;Pain Barriers to Discharge: None OT Therapy Diagnosis : Generalized weakness OT Plan OT Frequency: Min 2X/week OT Treatment/Interventions: Self-care/ADL training;Therapeutic exercise;Energy conservation;DME and/or AE instruction;Therapeutic activities;Patient/family education OT Recommendation Follow Up Recommendations: No OT follow up Equipment Recommended: Rolling walker with 5" wheels;3  in 1 bedside comode Individuals Consulted Consulted and Agree with Results and Recommendations: Patient OT Goals Acute Rehab OT Goals OT Goal Formulation: With patient Time For Goal Achievement: 2 weeks ADL Goals Pt Will Perform Lower Body Bathing: with modified independence;Sit to stand from chair;Unsupported;with adaptive equipment ADL Goal: Lower Body Bathing - Progress: Goal set today Pt Will Perform Lower Body Dressing: with modified independence;Sit to stand from chair;Unsupported;with adaptive equipment ADL Goal: Lower Body Dressing - Progress: Goal set today Pt Will Transfer to Toilet: with modified independence;Ambulation;3-in-1 ADL Goal: Toilet Transfer - Progress: Goal set today Pt Will Perform Toileting - Clothing Manipulation: with modified independence;Standing ADL Goal: Toileting - Clothing Manipulation - Progress: Goal set today Pt Will Perform Toileting - Hygiene: Independently;Sit to stand from 3-in-1/toilet ADL Goal: Toileting - Hygiene - Progress: Goal set today Pt Will Perform Tub/Shower Transfer: Tub transfer;with modified independence;with DME;Ambulation ADL Goal: Tub/Shower Transfer - Progress: Goal set today Additional ADL Goal #1: Independently demonstrate 1 energy conservation technique during ADL session ADL Goal: Additional Goal #1 - Progress: Goal set today Arm Goals Pt Will Complete Theraband Exer: Independently;Bilateral upper extremities;to increase strength;Level 4 Theraband Arm Goal: Theraband Exercises - Progress: Goal set today  OT Evaluation Precautions/Restrictions  Precautions Precaution Comments: NG tube Restrictions Weight Bearing Restrictions: No Prior Functioning Home Living Lives With: Alone Receives Help From: Family (Sister plans to come stay wih pt) Type of Home: House Home Layout: One level Home Access: Stairs to enter Entrance Stairs-Rails: None Entrance Stairs-Number of Steps: 2 Prior Function Level of Independence:  Independent with homemaking with ambulation Driving: Yes Vocation: Full time employment ADL ADL Eating/Feeding: NPO;Other (comment) (NG Tube) Grooming: Set up;Performed Where  Assessed - Grooming: Supine, head of bed up Upper Body Bathing: Simulated;Set up Lower Body Bathing: Simulated;Maximal assistance Where Assessed - Lower Body Bathing: Supine, head of bed up Upper Body Dressing: Simulated;Minimal assistance Where Assessed - Upper Body Dressing: Supine, head of bed up Lower Body Dressing: Simulated;Maximal assistance Where Assessed - Lower Body Dressing: Supine, head of bed up;Rolling right and/or left Ambulation Related to ADLs: NT secondary to pt feeling that he was going to vomit ADL Comments: /assistance needed primarily due to weakness/poor endurance Vision/Perception  Vision - History Baseline Vision: No visual deficits Cognition Cognition Arousal/Alertness: Awake/alert Overall Cognitive Status: Appears within functional limits for tasks assessed Orientation Level: Oriented X4 Sensation/Coordination Coordination Gross Motor Movements are Fluid and Coordinated: Yes Fine Motor Movements are Fluid and Coordinated: Yes Extremity Assessment RUE Assessment RUE Assessment: Within Functional Limits LUE Assessment LUE Assessment: Within Functional Limits Mobility  Bed Mobility Bed Mobility: No Rolling Left: Other (comment) (bed mobility ) Transfers Transfers: No (TBA as pt is more able to participate) Exercises General Exercises - Upper Extremity Shoulder Flexion: Theraband;Both Theraband Level (Shoulder Flexion): Level 4 (Blue) Shoulder Extension:  (general UB strengthening ex with theraband ) End of Session OT - End of Session Activity Tolerance: Patient limited by fatigue;Other (comment) (limited eval due to N/V) Patient left: in bed;with call bell in reach General Behavior During Session: [facility] for tasks performed Cognition: [facility] for tasks performed    [facility] 02/01/2012, 9:30 AM  [facility], OTR/L  [phone] 02/01/2012

## 2012-02-01 NOTE — Progress Notes (Signed)
Patient ID: Jared [name], male   DOB: 1966-04-02, 46 y.o.   MRN: [id number] 7 Days Post-Op  Subjective: Pt feels the same today.  Still having a lot of abdominal pain, but also c/o throat pain from NGT.  He threw up 3 times around his NGT last night.  Objective: Vital signs in last 24 hours: Temp:  [97.3 F (36.3 C)-98.3 F (36.8 C)] 97.9 F (36.6 C) (03/19 0606) Pulse Rate:  [110-115] 114  (03/19 0606) Resp:  [16-26] 20  (03/19 0606) BP: (118-128)/(71-78) 118/77 mmHg (03/19 0606) SpO2:  [93 %-98 %] 96 % (03/19 0606) Last BM Date: 01/23/12  Intake/Output from previous day: 03/18 0701 - 03/19 0700 In: 3225 [P.O.:480; I.V.:1580; NG/GT:675; TPN:490] Out: 3500 [Urine:2000; Emesis/NG output:1500] Intake/Output this shift:    PE: Abd: soft, mild distention, still very tender, wound is clean and packed.  NGT with thick bilious output. Heart: tachy Lungs: CTAB  Lab Results:   Basename 02/01/12 0550 01/31/12 0635  WBC 17.5* 24.7*  HGB 13.8 14.0  HCT 40.0 39.8  PLT 271 217   BMET  Basename 02/01/12 0550 01/31/12 0635  NA 138 136  K 3.2* 4.0  CL 94* 96  CO2 32 26  GLUCOSE 185* 216*  BUN 12 10  CREATININE 0.87 0.78  CALCIUM 8.7 8.8   PT/INR No results found for this basename: LABPROT:2,INR:2 in the last 72 hours CMP     Component Value Date/Time   NA 138 02/01/2012 0550   K 3.2* 02/01/2012 0550   CL 94* 02/01/2012 0550   CO2 32 02/01/2012 0550   GLUCOSE 185* 02/01/2012 0550   BUN 12 02/01/2012 0550   CREATININE 0.87 02/01/2012 0550   CALCIUM 8.7 02/01/2012 0550   PROT 6.5 01/31/2012 0635   ALBUMIN 2.0* 01/31/2012 0635   AST 18 01/31/2012 0635   ALT 16 01/31/2012 0635   ALKPHOS 93 01/31/2012 0635   BILITOT 1.7* 01/31/2012 0635   GFRNONAA >90 02/01/2012 0550   GFRAA >90 02/01/2012 0550   Lipase     Component Value Date/Time   LIPASE 51 01/20/2012 0500       Studies/Results: Ct Abdomen Pelvis W Contrast  01/31/2012  *RADIOLOGY REPORT*  Clinical Data: Small bowel  resection last week secondary to obstruction.  History of Crohn's disease.  Lower abdominal pain. Elevated white blood cell count.  CT ABDOMEN AND PELVIS WITH CONTRAST  Technique:  Multidetector CT imaging of the abdomen and pelvis was performed following the standard protocol during bolus administration of intravenous contrast.  Contrast:  80 ml Omnipaque-300  Comparison: Plain films of 01/24/2012.  CT of 01/20/2012.  Findings: Patchy right greater than left bibasilar atelectasis. Normal heart size with trace right-sided pleural fluid.  Mild degradation secondary patient arm position, not raised above the head.  Normal liver.  Small volume perihepatic ascites.  A splenule.  Normal stomach, pancreas, gallbladder, biliary tract, adrenal glands.  Too small to characterize left renal lesions.  Small retroperitoneal nodes which are unchanged and likely reactive.  Similar prominent gastrohepatic ligament nodes.  Under distended sigmoid colon.  Surgical sutures in the region of the terminal ileum and cecum.  The neoterminal ileum is underdistended.  Prominent jejunal mesenteric nodes, also likely reactive.  No free intraperitoneal air.  A multiloculated fluid gas collection is identified within the leaves of the ileocolic mesentery.  This is somewhat ill-defined superiorly on image 50, but better defined more inferiorly on image 58, measures 5.3 x 3.2 cm.  It is contiguous with  a central left paracentral fluid collection which measures 5.9 x 3.7 cm on image 67.  This has increased density dependently, including on images 59 and 68.  Mild surrounding mesenteric edema.  No spill of contrast in this collection.  No pelvic adenopathy.    Normal urinary bladder and prostate. Small volume complex cul-de-sac fluid on image 88.  This is hyperattenuating dependently.  Midline laparotomy changes.  Sacroiliac joint partial fusion may relate to sacroiliitis.  IMPRESSION:  1.  Complex fluid and gas collection within the ileocolic  mesentery.  Increased density within.  Suspicious for developing abscess or infected hematoma.  Increased density within is favored to represent blood products.  Enteric contrast within could look similar.  Case discussed with Dr. Ninfa Linden, at 325pm. 2.  Complex fluid within the pelvic cul-de-sac.  Also either blood products or contrast.  No surrounding inflammation or enhancement to suggest developing abscess. 3.  Bibasilar atelectasis with trace right pleural fluid. 4.  Small volume perihepatic ascites. 5.  Ileocolic anastomoses with surrounding postoperative edema. 6.  Borderline abdominal adenopathy, similar and likely reactive.  Original Report Authenticated By: Areta Haber, M.D.    Anti-infectives: Anti-infectives     Start     Dose/Rate Route Frequency Ordered Stop   01/31/12 1100  piperacillin-tazobactam (ZOSYN) IVPB 3.375 g       3.375 g 12.5 mL/hr over 240 Minutes Intravenous Every 8 hours 01/31/12 0957     01/24/12 1722   ertapenem (INVANZ) 1 g in sodium chloride 0.9 % 50 mL IVPB        1 g 100 mL/hr over 30 Minutes Intravenous 60 min pre-op 01/24/12 1722 01/25/12 0948           Assessment/Plan  1. S/p SBR for crohn's stricture 2. Fluid collection around anastomosis.  Plan: 1. Will d/w Dr. Ninfa Linden about further plan. These fluid collections are not in a place where a percutaneous drain can be placed.  There is no definite visualization of contrast extravasation to confirm an anastomotic leak, but this still can't be completely ruled out.  Will make further recs after d/w MD.   LOS: 12 days    Danney Bungert E 02/01/2012

## 2012-02-01 NOTE — Progress Notes (Signed)
Internal Medicine Attending  Date: 02/01/2012  Patient name: Jared [name] Medical record number: [id number] Date of birth: [birth year] Age: 46 y.o. Gender: male  I saw and evaluated the patient. I reviewed the resident's note by Dr. Nicoletta Dress and I agree with the resident's findings and plans as documented in her progress note.  [name] states that his pain is unchanged from yesterday. He had nausea overnight despite NG tube suction he continues to have a lot of NG output. CT scan demonstrated a peri anastomotic fluid collection concerning for an abscess. Unfortunately this is not easily approached and drained percutaneously. The plan is to continue with broad-spectrum antibiotics and follow clinically. Surgery is closely following with [place]. If he were to decompensate, surgery would likely be required and broadening of his antibiotic coverage to include vancomycin may be necessary.

## 2012-02-01 NOTE — Progress Notes (Signed)
PARENTERAL NUTRITION CONSULT NOTE - FOLLOW UP  Pharmacy Consult for TPN Indication: post-op ileus s/p SBR  No Known Allergies  Patient Measurements: Height: 6' 2"  (188 cm) Weight: 210 lb (95.255 kg) IBW/kg (Calculated) : 82.2   Vital Signs: Temp: 97.9 F (36.6 C) (03/19 0606) BP: 118/77 mmHg (03/19 0606) Pulse Rate: 114  (03/19 0606) Intake/Output from previous day: 03/18 0701 - 03/19 0700 In: 3225 [P.O.:480; I.V.:1580; NG/GT:675; TPN:490] Out: 3500 [Urine:2000; Emesis/NG output:1500] Intake/Output from this shift:    Labs:  [facility] 02/01/12 0550 01/31/12 0635 01/30/12 0530  WBC 17.5* 24.7* 15.5*  HGB 13.8 14.0 14.0  HCT 40.0 39.8 40.2  PLT 271 217 261  APTT -- -- --  INR -- -- --     Basename 02/01/12 0550 01/31/12 0635 01/30/12 0530  NA 138 136 132*  K 3.2* 4.0 3.8  CL 94* 96 95*  CO2 32 26 27  GLUCOSE 185* 216* 252*  BUN 12 10 9   CREATININE 0.87 0.78 0.82  LABCREA -- -- --  CREAT24HRUR -- -- --  CALCIUM 8.7 8.8 8.6  MG 2.3 2.2 2.0  PHOS -- 3.7 --  PROT -- 6.5 --  ALBUMIN -- 2.0* --  AST -- 18 --  ALT -- 16 --  ALKPHOS -- 93 --  BILITOT -- 1.7* --  BILIDIR -- -- --  IBILI -- -- --  PREALBUMIN -- 4.3* --  TRIG -- 146 --  CHOLHDL -- -- --  CHOL -- 148 --   Estimated Creatinine Clearance: 124.7 ml/min (by C-G formula based on Cr of 0.87).    Basename 02/01/12 0516 01/31/12 2134 01/31/12 1649  GLUCAP 189* 173* 214*    Medications:  Scheduled:     . enoxaparin  40 mg Subcutaneous Q24H  . HYDROmorphone PCA 0.3 mg/mL   Intravenous Q4H  . HYDROmorphone PCA 0.3 mg/mL      . insulin aspart  0-20 Units Subcutaneous Q4H  . insulin glargine  10 Units Subcutaneous Daily  . iohexol  20 mL Oral Q1 Hr x 2  . piperacillin-tazobactam (ZOSYN)  IV  3.375 g Intravenous Q8H  . potassium chloride  10 mEq Intravenous Q1 Hr x 4  . DISCONTD: insulin glargine  5 Units Subcutaneous Daily    Insulin Requirements in the past 24 hours:  40 units on resistant  SSI   Nutritional Goals:  2100-2300 kcal, 115-130 gm protein daily  Assessment: 54 YOM with post-op ileus s/p SBR (POD#7) to continue on TPN.  Noted CT showing development of abscess. - Lytes: hypokalemia, others WNL.  MD ordered 4 runs KCL. - Endo:  CBGs improving but remain above goal of < 150 mg/dL, noted Lantus dose increased to 10 units SQ daily - Renal:  SCr stable, decent UOP, NS with 20 mEq KCL at 60 ml/hr - LFTs / tbili / TG / TC WNL   Plan:  - Increase Clinimix E 5/15 to 65 ml/hr (goal ~105 ml/hr), will not advance to goal rate d/t elevated CBGs - Lipids 10 ml/hr on MWF d/t national shortage - Multivitamins and trace elements on MWF - Increase insulin to 130 units in TPN (proportionate increase since TPN volume increased) - F/U CBGs, lytes    Johnnette Gourd, PharmD 02/01/2012,8:40 AM

## 2012-02-01 NOTE — Progress Notes (Signed)
Physical Therapy Treatment Patient Details Name: Jared [name] MRN: [id number] DOB: March 11, 1966 Today's Date: 02/01/2012  PT Assessment/Plan  PT - Assessment/Plan Comments on Treatment Session: Pt with good participation despite pain PT Plan: Discharge plan remains appropriate PT Frequency: Min 3X/week Follow Up Recommendations: Home health PT;Supervision - Intermittent Equipment Recommended: Rolling walker with 5" wheels;3 in 1 bedside comode PT Goals  Acute Rehab PT Goals Time For Goal Achievement: 2 weeks Pt will go Supine/Side to Sit: with modified independence PT Goal: Supine/Side to Sit - Progress: Progressing toward goal Pt will go Sit to Supine/Side: with modified independence Pt will go Sit to Stand: with modified independence PT Goal: Sit to Stand - Progress: Progressing toward goal Pt will go Stand to Sit: with modified independence PT Goal: Stand to Sit - Progress: Progressing toward goal Pt will Ambulate: >150 feet;with modified independence;with least restrictive assistive device PT Goal: Ambulate - Progress: Progressing toward goal (slowly)  PT Treatment Precautions/Restrictions  Precautions Precaution Comments: NG tube Restrictions Weight Bearing Restrictions: No Mobility (including Balance) Bed Mobility Rolling Left: 4: Min assist (guard assist) Rolling Left Details (indicate cue type and reason): continued cues for technique Left Sidelying to Sit: 3: Mod assist;With rails Left Sidelying to Sit Details (indicate cue type and reason): continued cues for technique Transfers Sit to Stand: 3: Mod assist;From bed;With upper extremity assist Sit to Stand Details (indicate cue type and reason): continued cues for safety and technique; seems weaker today Stand to Sit: 3: Mod assist Stand to Sit Details: cues for hand placement and control Ambulation/Gait Ambulation/Gait Assistance: 4: Min assist (Second person for multiple lines (2 IV poles)) Ambulation/Gait  Assistance Details (indicate cue type and reason): walked less distance today with smaller steps, and slower cadence; pt in a large amount of pain Ambulation Distance (Feet): 15 Feet Assistive device: Rolling walker Gait Pattern: Step-through pattern Gait velocity: slow Stairs: No    End of Session PT - End of Session Activity Tolerance: Patient limited by fatigue;Patient limited by pain Patient left: in chair;with call bell in reach Nurse Communication: Mobility status for ambulation General Behavior During Session: [facility] for tasks performed Cognition: [facility] for tasks performed  Roney Marion [facility] [place], [place]  02/01/2012, 4:55 PM

## 2012-02-02 DIAGNOSIS — K651 Peritoneal abscess: Secondary | ICD-10-CM

## 2012-02-02 LAB — GLUCOSE, CAPILLARY
Glucose-Capillary: 102 mg/dL — ABNORMAL HIGH (ref 70–99)
Glucose-Capillary: 103 mg/dL — ABNORMAL HIGH (ref 70–99)
Glucose-Capillary: 111 mg/dL — ABNORMAL HIGH (ref 70–99)
Glucose-Capillary: 115 mg/dL — ABNORMAL HIGH (ref 70–99)
Glucose-Capillary: 121 mg/dL — ABNORMAL HIGH (ref 70–99)
Glucose-Capillary: 69 mg/dL — ABNORMAL LOW (ref 70–99)

## 2012-02-02 LAB — BASIC METABOLIC PANEL
BUN: 14 mg/dL (ref 6–23)
CO2: 31 mEq/L (ref 19–32)
Calcium: 8.9 mg/dL (ref 8.4–10.5)
Chloride: 95 mEq/L — ABNORMAL LOW (ref 96–112)
Creatinine, Ser: 0.91 mg/dL (ref 0.50–1.35)
GFR calc Af Amer: >90 mL/min (ref >90)
GFR calc non Af Amer: >90 mL/min (ref >90)
Glucose, Bld: 109 mg/dL — ABNORMAL HIGH (ref 70–99)
Potassium: 3.3 mEq/L — ABNORMAL LOW (ref 3.5–5.1)
Sodium: 137 mEq/L (ref 135–145)

## 2012-02-02 LAB — CBC
HCT: 39.7 % (ref 39.0–52.0)
Hemoglobin: 13.5 g/dL (ref 13.0–17.0)
MCH: 28.7 pg (ref 26.0–34.0)
MCHC: 34 g/dL (ref 30.0–36.0)
MCV: 84.5 fL (ref 78.0–100.0)
Platelets: 295 10*3/uL (ref 150–400)
RBC: 4.7 MIL/uL (ref 4.22–5.81)
RDW: 14 % (ref 11.5–15.5)
WBC: 12.4 10*3/uL — ABNORMAL HIGH (ref 4.0–10.5)

## 2012-02-02 LAB — URINE CULTURE
Colony Count: NO GROWTH
Culture  Setup Time: 201303191612
Culture: NO GROWTH

## 2012-02-02 LAB — MAGNESIUM: Magnesium: 2.6 mg/dL — ABNORMAL HIGH (ref 1.5–2.5)

## 2012-02-02 MED ORDER — PIPERACILLIN-TAZOBACTAM 3.375 G IVPB
3.3750 g | Freq: Three times a day (TID) | INTRAVENOUS | Status: DC
  Administered 2012-02-02 – 2012-02-08 (×18): 3.375 g via INTRAVENOUS
  Filled 2012-02-02 (×21): qty 50

## 2012-02-02 MED ORDER — SODIUM CHLORIDE 0.9 % IV SOLN
INTRAVENOUS | Status: DC
  Administered 2012-02-03: 09:00:00 via INTRAVENOUS
  Filled 2012-02-02 (×11): qty 1000

## 2012-02-02 MED ORDER — HYDROMORPHONE 0.3 MG/ML IV SOLN
INTRAVENOUS | Status: AC
  Filled 2012-02-02: qty 25

## 2012-02-02 MED ORDER — DEXTROSE 50 % IV SOLN
INTRAVENOUS | Status: AC
  Administered 2012-02-02: 25 mL
  Filled 2012-02-02: qty 50

## 2012-02-02 MED ORDER — FAT EMULSION 20 % IV EMUL
250.0000 mL | INTRAVENOUS | Status: AC
  Administered 2012-02-02: 250 mL via INTRAVENOUS
  Filled 2012-02-02: qty 250

## 2012-02-02 MED ORDER — SODIUM CHLORIDE 0.9 % IV SOLN
INTRAVENOUS | Status: DC
  Administered 2012-02-02 – 2012-02-07 (×4): via INTRAVENOUS
  Filled 2012-02-02 (×12): qty 1000

## 2012-02-02 MED ORDER — POTASSIUM CHLORIDE 10 MEQ/50ML IV SOLN
10.0000 meq | INTRAVENOUS | Status: AC
  Administered 2012-02-02 (×3): 10 meq via INTRAVENOUS
  Filled 2012-02-02 (×3): qty 50

## 2012-02-02 MED ORDER — VANCOMYCIN HCL IN DEXTROSE 1-5 GM/200ML-% IV SOLN
1000.0000 mg | Freq: Three times a day (TID) | INTRAVENOUS | Status: DC
  Administered 2012-02-02 – 2012-02-03 (×3): 1000 mg via INTRAVENOUS
  Filled 2012-02-02 (×5): qty 200

## 2012-02-02 MED ORDER — TRACE MINERALS CR-CU-MN-SE-ZN 10-1000-500-60 MCG/ML IV SOLN
INTRAVENOUS | Status: AC
  Administered 2012-02-02: 17:00:00 via INTRAVENOUS
  Filled 2012-02-02: qty 2520

## 2012-02-02 NOTE — Progress Notes (Signed)
Patient placed on contact isolation per infectious disease policy and procedure. Possible MRSA Delfin Edis [facility] [place]

## 2012-02-02 NOTE — Progress Notes (Signed)
CBG: 69  Treatment: D50 IV 25 mL  Symptoms: None  Follow-up CBG: Time:2200 CBG Result:103  Possible Reasons for Event: Inadequate meal intake NPO  Comments/MD notified:Will continue to monitor closely    Hively, Luciano Cutter

## 2012-02-02 NOTE — Progress Notes (Signed)
Pt, per rept, admitted with abdominal pain and resulted in s/p small bowel resection. Pt has hypoactive bowel sounds x 4. Pt repts intermittent nausea that occurs with pain or "strong smells". Pt denies nausea at this time and pt instructed to call with any s/sx nausea. Pt verb understanding and agrees to comply. Pt abdomen is distended, tight and tender at incision site. Pt has decreased ROM of abdomen related to pain. Pt is weak and deconditioned. Pt guards with slight abdominal palpation. Pt has a NG to R nares that is draining mod to large amount of dark green-brown secretions. NG is at low intermittent suction as per order. Pt repts LBM 3/14. Pt has no pressure skin issues of heels or sacral area. Pt refuses to turn related to abdominal pain. Pt is alert and oriented x 3. Neuro check is negative. Pt MAEx 4. Pt voids quantity sufficient in urinal without difficulty. Pt lungs CTA and heart rate regular rate and rhythm. Heart rate noted to be in the low 100's at times. No s/sx cardiac or resp distress currently. Pt repts that he is occasionally SOB due to irritation of "tube in my nose". Pt denies any change in SOB. Pt has a prod cough with mod amount of clear to white phlegm. Pt sats 97% 3lpm.  Pt has a dry gauze dressing to abdomen that per rept was changed per order this AM.

## 2012-02-02 NOTE — Progress Notes (Signed)
NG tube clamped per order @ noon today. Pt. Began c/o nausea at 1700. NG tube suction turned back on. Margurite Auerbach

## 2012-02-02 NOTE — Progress Notes (Signed)
Patient ID: Jared [name], male   DOB: 1966/04/19, 46 y.o.   MRN: [id number] 8 Days Post-Op  Subjective: Pt still feels about the same, but he is passing flatus.  Objective: Vital signs in last 24 hours: Temp:  [97.6 F (36.4 C)-100.8 F (38.2 C)] 97.6 F (36.4 C) (03/20 0558) Pulse Rate:  [108-111] 108  (03/20 0558) Resp:  [15-22] 20  (03/20 0558) BP: (109-116)/(72-77) 109/73 mmHg (03/20 0558) SpO2:  [91 %-100 %] 97 % (03/20 0558) Last BM Date: 01/23/12  Intake/Output from previous day: 03/19 0701 - 03/20 0700 In: 2460 [P.O.:960; I.V.:720; TPN:780] Out: 1500 [Urine:600; Emesis/NG output:900] Intake/Output this shift:    PE: Abd: soft, still tender, some BS, wound is clean.  NGT output appears somewhat watered down.  PO:960cc, NGT: 900cc  Lab Results:   Basename 02/02/12 0535 02/01/12 0550  WBC 12.4* 17.5*  HGB 13.5 13.8  HCT 39.7 40.0  PLT 295 271   BMET  Basename 02/02/12 0535 02/01/12 0550  NA 137 138  K 3.3* 3.2*  CL 95* 94*  CO2 31 32  GLUCOSE 109* 185*  BUN 14 12  CREATININE 0.91 0.87  CALCIUM 8.9 8.7   PT/INR No results found for this basename: LABPROT:2,INR:2 in the last 72 hours CMP     Component Value Date/Time   NA 137 02/02/2012 0535   K 3.3* 02/02/2012 0535   CL 95* 02/02/2012 0535   CO2 31 02/02/2012 0535   GLUCOSE 109* 02/02/2012 0535   BUN 14 02/02/2012 0535   CREATININE 0.91 02/02/2012 0535   CALCIUM 8.9 02/02/2012 0535   PROT 6.5 01/31/2012 0635   ALBUMIN 2.0* 01/31/2012 0635   AST 18 01/31/2012 0635   ALT 16 01/31/2012 0635   ALKPHOS 93 01/31/2012 0635   BILITOT 1.7* 01/31/2012 0635   GFRNONAA >90 02/02/2012 0535   GFRAA >90 02/02/2012 0535   Lipase     Component Value Date/Time   LIPASE 51 01/20/2012 0500       Studies/Results: Ct Abdomen Pelvis W Contrast  01/31/2012  *RADIOLOGY REPORT*  Clinical Data: Small bowel resection last week secondary to obstruction.  History of Crohn's disease.  Lower abdominal pain. Elevated white blood cell  count.  CT ABDOMEN AND PELVIS WITH CONTRAST  Technique:  Multidetector CT imaging of the abdomen and pelvis was performed following the standard protocol during bolus administration of intravenous contrast.  Contrast:  80 ml Omnipaque-300  Comparison: Plain films of 01/24/2012.  CT of 01/20/2012.  Findings: Patchy right greater than left bibasilar atelectasis. Normal heart size with trace right-sided pleural fluid.  Mild degradation secondary patient arm position, not raised above the head.  Normal liver.  Small volume perihepatic ascites.  A splenule.  Normal stomach, pancreas, gallbladder, biliary tract, adrenal glands.  Too small to characterize left renal lesions.  Small retroperitoneal nodes which are unchanged and likely reactive.  Similar prominent gastrohepatic ligament nodes.  Under distended sigmoid colon.  Surgical sutures in the region of the terminal ileum and cecum.  The neoterminal ileum is underdistended.  Prominent jejunal mesenteric nodes, also likely reactive.  No free intraperitoneal air.  A multiloculated fluid gas collection is identified within the leaves of the ileocolic mesentery.  This is somewhat ill-defined superiorly on image 50, but better defined more inferiorly on image 58, measures 5.3 x 3.2 cm.  It is contiguous with a central left paracentral fluid collection which measures 5.9 x 3.7 cm on image 67.  This has increased density dependently, including on  images 59 and 68.  Mild surrounding mesenteric edema.  No spill of contrast in this collection.  No pelvic adenopathy.    Normal urinary bladder and prostate. Small volume complex cul-de-sac fluid on image 88.  This is hyperattenuating dependently.  Midline laparotomy changes.  Sacroiliac joint partial fusion may relate to sacroiliitis.  IMPRESSION:  1.  Complex fluid and gas collection within the ileocolic mesentery.  Increased density within.  Suspicious for developing abscess or infected hematoma.  Increased density within is  favored to represent blood products.  Enteric contrast within could look similar.  Case discussed with Dr. Ninfa Linden, at 325pm. 2.  Complex fluid within the pelvic cul-de-sac.  Also either blood products or contrast.  No surrounding inflammation or enhancement to suggest developing abscess. 3.  Bibasilar atelectasis with trace right pleural fluid. 4.  Small volume perihepatic ascites. 5.  Ileocolic anastomoses with surrounding postoperative edema. 6.  Borderline abdominal adenopathy, similar and likely reactive.  Original Report Authenticated By: Areta Haber, M.D.    Anti-infectives: Anti-infectives     Start     Dose/Rate Route Frequency Ordered Stop   02/02/12 1400  piperacillin-tazobactam (ZOSYN) IVPB 3.375 g       3.375 g 12.5 mL/hr over 240 Minutes Intravenous 3 times per day 02/02/12 0532     02/02/12 1200   vancomycin (VANCOCIN) IVPB 1000 mg/200 mL premix        1,000 mg 200 mL/hr over 60 Minutes Intravenous Every 8 hours 02/02/12 1059     01/31/12 1100   piperacillin-tazobactam (ZOSYN) IVPB 3.375 g  Status:  Discontinued        3.375 g 12.5 mL/hr over 240 Minutes Intravenous Every 8 hours 01/31/12 0957 02/02/12 0532   01/24/12 1722   ertapenem (INVANZ) 1 g in sodium chloride 0.9 % 50 mL IVPB        1 g 100 mL/hr over 30 Minutes Intravenous 60 min pre-op 01/24/12 1722 01/25/12 0948           Assessment/Plan  1. S/p SBR for crohn's stricture 2. Post op fluid collection with decreasing WBC   Plan: 1. Will try to clamp NGT as output is made up by PO intake of ice chips.  Will limit ice chips with NGT clamped and see how he does. 2. Cont to clinically follow as WBC trending down. 3. Cont to mobilize.  LOS: 13 days    Trenisha Lafavor E 02/02/2012

## 2012-02-02 NOTE — Progress Notes (Signed)
I have seen and examined the patient and agree with the assessment and plans.  Aniyah Nobis A. Ninfa Linden  MD, FACS

## 2012-02-02 NOTE — Progress Notes (Signed)
ANTIBIOTIC CONSULT NOTE - INITIAL  Pharmacy Consult for vancomycin Indication: r/o intraabdominal infection/abscess, coverage for possible MRSA  No Known Allergies  Patient Measurements: Height: 6' 2"  (188 cm) Weight: 210 lb (95.255 kg) IBW/kg (Calculated) : 82.2    Vital Signs: Temp: 97.6 F (36.4 C) (03/20 0558) BP: 109/73 mmHg (03/20 0558) Pulse Rate: 108  (03/20 0558) Intake/Output from previous day: 03/19 0701 - 03/20 0700 In: 2460 [P.O.:960; I.V.:720; TPN:780] Out: 1500 [Urine:600; Emesis/NG output:900] Intake/Output from this shift:    Labs:  Basename 02/02/12 0535 02/01/12 0550 01/31/12 0635  WBC 12.4* 17.5* 24.7*  HGB 13.5 13.8 14.0  PLT 295 271 217  LABCREA -- -- --  CREATININE 0.91 0.87 0.78   Estimated Creatinine Clearance: 119.2 ml/min (by C-G formula based on Cr of 0.91). No results found for this basename: VANCOTROUGH:2,VANCOPEAK:2,VANCORANDOM:2,GENTTROUGH:2,GENTPEAK:2,GENTRANDOM:2,TOBRATROUGH:2,TOBRAPEAK:2,TOBRARND:2,AMIKACINPEAK:2,AMIKACINTROU:2,AMIKACIN:2, in the last 72 hours   Microbiology: Recent Results (from the past 720 hour(s))  CLOSTRIDIUM DIFFICILE BY PCR     Status: Normal   Collection Time   01/22/12  7:38 PM      Component Value Range Status Comment   C difficile by pcr NEGATIVE  NEGATIVE  Final   SURGICAL PCR SCREEN     Status: Normal   Collection Time   01/24/12  8:45 PM      Component Value Range Status Comment   MRSA, PCR NEGATIVE  NEGATIVE  Final    Staphylococcus aureus NEGATIVE  NEGATIVE  Final   CULTURE, BLOOD (ROUTINE X 2)     Status: Normal (Preliminary result)   Collection Time   01/31/12 10:51 AM      Component Value Range Status Comment   Specimen Description BLOOD LEFT ARM   Final    Special Requests     Final    Value: BOTTLES DRAWN AEROBIC AND ANAEROBIC AERO 10CC ANA 5CC   Culture  Setup Time [id number]   Final    Culture     Final    Value:        BLOOD CULTURE RECEIVED NO GROWTH TO DATE CULTURE WILL BE HELD FOR 5  DAYS BEFORE ISSUING A FINAL NEGATIVE REPORT   Report Status PENDING   Incomplete   CULTURE, BLOOD (ROUTINE X 2)     Status: Normal (Preliminary result)   Collection Time   01/31/12 10:56 AM      Component Value Range Status Comment   Specimen Description BLOOD LEFT HAND   Final    Special Requests BOTTLES DRAWN AEROBIC ONLY 5CC   Final    Culture  Setup Time [id number]   Final    Culture     Final    Value: GRAM POSITIVE COCCI IN CLUSTERS     Note: Gram Stain Report Called to,Read Back By and Verified With: [name] @0135  ON 02/02/2012 BY MCLET   Report Status PENDING   Incomplete   URINE CULTURE     Status: Normal (Preliminary result)   Collection Time   01/31/12 11:16 AM      Component Value Range Status Comment   Specimen Description URINE, CLEAN CATCH   Final    Special Requests ADDED 02/01/12 1539   Final    Culture  Setup Time [id number]   Final    Colony Count PENDING   Incomplete    Culture NO GROWTH   Final    Report Status PENDING   Incomplete     Medical History: Past Medical History  Diagnosis Date  . Crohn disease 2008  with hospital admission in 2011, and 8/12 for flare up and SBO relieved with bowel rest. no suregery, no meds for CD  . Diabetes mellitus type II     Medications:  Prescriptions prior to admission  Medication Sig Dispense Refill  . glyBURIDE (DIABETA) 5 MG tablet Take 5 mg by mouth daily with breakfast.      . metFORMIN (GLUCOPHAGE) 500 MG tablet Take 500 mg by mouth 2 (two) times daily with a meal.       Assessment: 46 yo man with h/o Chron's disease s/p small bowel resection to start vancomycin for possible MRSA.  1/2 BC GPC, intraabdominal abscess.  Goal of Therapy:  Vanc trough 15-20 mg/L  Plan:  Vancomycin 1gm IV q8 hours. F/u cultures, clinical progress. Check vanc trough when appropriate.  Jared [name] 02/02/2012,10:53 AM

## 2012-02-02 NOTE — Progress Notes (Signed)
Internal Medicine Attending  Date: 02/02/2012  Patient name: Jared [name] Medical record number: [id number] Date of birth: 11-01-1966 Age: 46 y.o. Gender: male  I saw and evaluated the patient. I reviewed the resident's note by Dr. Nicoletta Dress and I agree with the resident's findings and plans as documented in her progress note.  [name] continues to note mild nausea and mild abdominal pain. His biggest complaint is his chronic sore throat. He states he has passed some flatus. He is tolerating ice chips well. Surgery has decided to intermittently clamp his NG tube and is continuing to follow closely. He had one of 2 blood cultures positive for gram-positive cocci in clusters and we are waiting the identification of the bacteria. In the meantime, we are continuing broad-spectrum antibiotics with improvement in his white blood cell count. My exam today demonstrated a soft and only mildly tender abdominal exam which is improved over the last 48 hours. We will continue current supportive care and monitor the progress of his ileus.

## 2012-02-02 NOTE — Progress Notes (Signed)
PARENTERAL NUTRITION CONSULT NOTE - FOLLOW UP  Pharmacy Consult for TPN Indication: post-op ileus s/p SBR  No Known Allergies  Patient Measurements: Height: 6' 2"  (188 cm) Weight: 210 lb (95.255 kg) IBW/kg (Calculated) : 82.2   Vital Signs: Temp: 97.6 F (36.4 C) (03/20 0558) BP: 109/73 mmHg (03/20 0558) Pulse Rate: 108  (03/20 0558) Intake/Output from previous day: 03/19 0701 - 03/20 0700 In: 2460 [P.O.:960; I.V.:720; TPN:780] Out: 1500 [Urine:600; Emesis/NG output:900] Intake/Output from this shift:    Labs:  [facility] 02/02/12 0535 02/01/12 0550 01/31/12 0635  WBC 12.4* 17.5* 24.7*  HGB 13.5 13.8 14.0  HCT 39.7 40.0 39.8  PLT 295 271 217  APTT -- -- --  INR -- -- --     Basename 02/02/12 0535 02/01/12 0550 01/31/12 0635  NA 137 138 136  K 3.3* 3.2* 4.0  CL 95* 94* 96  CO2 31 32 26  GLUCOSE 109* 185* 216*  BUN 14 12 10   CREATININE 0.91 0.87 0.78  LABCREA -- -- --  CREAT24HRUR -- -- --  CALCIUM 8.9 8.7 8.8  MG 2.6* 2.3 2.2  PHOS -- -- 3.7  PROT -- -- 6.5  ALBUMIN -- -- 2.0*  AST -- -- 18  ALT -- -- 16  ALKPHOS -- -- 93  BILITOT -- -- 1.7*  BILIDIR -- -- --  IBILI -- -- --  PREALBUMIN -- -- 4.3*  TRIG -- -- 146  CHOLHDL -- -- --  CHOL -- -- 148   Estimated Creatinine Clearance: 119.2 ml/min (by C-G formula based on Cr of 0.91).    Basename 02/02/12 0419 02/02/12 0041 02/01/12 1951  GLUCAP 115* 121* 120*    Medications:  Scheduled:     . enoxaparin  40 mg Subcutaneous Q24H  . HYDROmorphone PCA 0.3 mg/mL   Intravenous Q4H  . HYDROmorphone PCA 0.3 mg/mL      . insulin aspart  0-20 Units Subcutaneous Q4H  . insulin glargine  10 Units Subcutaneous Daily  . piperacillin-tazobactam (ZOSYN)  IV  3.375 g Intravenous Q8H  . potassium chloride  10 mEq Intravenous Q1 Hr x 4  . DISCONTD: piperacillin-tazobactam (ZOSYN)  IV  3.375 g Intravenous Q8H    Insulin Requirements in the past 24 hours:  15 units on resistant SSI   Nutritional Goals:    2100-2300 kcal, 115-130 gm protein daily  Current Nutrition: Clinimix + lipids = 2269 kcal + 126 gm protein Clinimix without lipids = 1789 kcal + 126 gm protein Daily average per week = 1995 kcal + 126 gm protein (meeting 95% of kcal and 100% of protein needs)  Assessment: 75 YOM with post-op ileus s/p SBR (POD#8) to continue on TPN.  Noted CT showing development of abscess. - Lytes: hypokalemia, mild hypochloremia - Endo:  CBGs controlled with 130 units in TPN + SSI + Lantus 10 units SQ daily - Renal:  SCr stable, decreased UOP, NS with 20 mEq KCL at 125 ml/hr - LFTs / tbili / TG / TC WNL   Plan:  - Increase Clinimix E 5/15 goal rate of 105 ml/hr since CBGs well controlled - Lipids 10 ml/hr on MWF d/t national shortage - Multivitamins and trace elements on MWF - Increase insulin to 164 units in TPN (proportionate increase since TPN volume increased) - Increase KCL in IVF (20 --> 19mq/L), KCL x 3 runs - F/U treatment plans and AM labs   TJohnnette Gourd PharmD 02/02/2012,8:39 AM

## 2012-02-02 NOTE — Progress Notes (Signed)
Subjective: Patient states that his abd is still the same which is relatively controlled by PCA. He reports that he vomited twice last night with minimum stomach contents. Reports passing a little flatulence. Objective: Vital signs in last 24 hours: Filed Vitals:   02/02/12 0800 02/02/12 0900 02/02/12 1259 02/02/12 1435  BP:      Pulse:      Temp:      TempSrc:      Resp: 16  16 20   Height:      Weight:  188 lb 3.2 oz (85.367 kg)    SpO2: 95%  96% 99%   Weight change:   Intake/Output Summary (Last 24 hours) at 02/02/12 1552 Last data filed at 02/02/12 1222  Gross per 24 hour  Intake   1740 ml  Output   1375 ml  Net    365 ml   Physical Exam: General: Acute ill appearing  Nose: NG tube with suction, dark brownish output noted.  Lungs: Bibasilar lung sounds diminished. No wheezing, rales or rhonchi noted.  Heart: RRR, Sinus tachycardia HR 110's, no M/G/R  Abdomen: soft, tenderness noted at LLQ with. Mild to moderate distension noted. No BS  Ext: No edema. SCDs on  Lab Results: Basic Metabolic Panel:  Lab 07/37/10 0535 02/01/12 0550 01/31/12 0635 01/29/12 0600  Jared [name] 137 138 -- --  K 3.3* 3.2* -- --  CL 95* 94* -- --  CO2 31 32 -- --  GLUCOSE 109* 185* -- --  BUN 14 12 -- --  CREATININE 0.91 0.87 -- --  CALCIUM 8.9 8.7 -- --  MG 2.6* 2.3 -- --  PHOS -- -- 3.7 3.1   Liver Function Tests:  Lab 01/31/12 0635 01/29/12 0600  AST 18 10  ALT 16 13  ALKPHOS 93 65  BILITOT 1.7* 0.8  PROT 6.5 6.4  ALBUMIN 2.0* 2.4*   CBC:  Lab 02/02/12 0535 02/01/12 0550 01/31/12 0635 01/29/12 0600  WBC 12.4* 17.5* -- --  NEUTROABS -- -- 20.8* 10.6*  HGB 13.5 13.8 -- --  HCT 39.7 40.0 -- --  MCV 84.5 83.7 -- --  PLT 295 271 -- --    CBG:  Lab 02/02/12 1222 02/02/12 0419 02/02/12 0041 02/01/12 1951 02/01/12 1611 02/01/12 1057  GLUCAP 102* 115* 121* 120* 126* 138*    Fasting Lipid Panel:  Lab 01/31/12 0635  CHOL 148  HDL --  LDLCALC --  TRIG 146  CHOLHDL --  LDLDIRECT  --   Urinalysis:  Lab 01/31/12 1116  COLORURINE ORANGE*  LABSPEC 1.035*  PHURINE 6.0  GLUCOSEU >1000*  HGBUR NEGATIVE  BILIRUBINUR MODERATE*  KETONESUR >80*  PROTEINUR 30*  UROBILINOGEN 2.0*  NITRITE POSITIVE*  LEUKOCYTESUR SMALL*    Micro Results: Recent Results (from the past 240 hour(s))  SURGICAL PCR SCREEN     Status: Normal   Collection Time   01/24/12  8:45 PM      Component Value Range Status Comment   MRSA, PCR NEGATIVE  NEGATIVE  Final    Staphylococcus aureus NEGATIVE  NEGATIVE  Final   CULTURE, BLOOD (ROUTINE X 2)     Status: Normal (Preliminary result)   Collection Time   01/31/12 10:51 AM      Component Value Range Status Comment   Specimen Description BLOOD LEFT ARM   Final    Special Requests     Final    Value: BOTTLES DRAWN AEROBIC AND ANAEROBIC AERO 10CC [place]   Culture  Setup Time [id number]  Final    Culture     Final    Value:        BLOOD CULTURE RECEIVED NO GROWTH TO DATE CULTURE WILL BE HELD FOR 5 DAYS BEFORE ISSUING A FINAL NEGATIVE REPORT   Report Status PENDING   Incomplete   CULTURE, BLOOD (ROUTINE X 2)     Status: Normal (Preliminary result)   Collection Time   01/31/12 10:56 AM      Component Value Range Status Comment   Specimen Description BLOOD LEFT HAND   Final    Special Requests BOTTLES DRAWN AEROBIC ONLY 5CC   Final    Culture  Setup Time [id number]   Final    Culture     Final    Value: GRAM POSITIVE COCCI IN CLUSTERS     Note: Gram Stain Report Called to,Read Back By and Verified With: [name] @0135  ON 02/02/2012 BY MCLET   Report Status PENDING   Incomplete   URINE CULTURE     Status: Normal   Collection Time   01/31/12 11:16 AM      Component Value Range Status Comment   Specimen Description URINE, CLEAN CATCH   Final    Special Requests ADDED 02/01/12 1539   Final    Culture  Setup Time [id number]   Final    Colony Count NO GROWTH   Final    Culture NO GROWTH   Final    Report Status 02/02/2012 FINAL    Final    Studies/Results: No results found. Medications: I have reviewed the patient's current medications. Scheduled Meds:   . enoxaparin  40 mg Subcutaneous Q24H  . HYDROmorphone PCA 0.3 mg/mL   Intravenous Q4H  . HYDROmorphone PCA 0.3 mg/mL      . insulin aspart  0-20 Units Subcutaneous Q4H  . insulin glargine  10 Units Subcutaneous Daily  . piperacillin-tazobactam (ZOSYN)  IV  3.375 g Intravenous Q8H  . potassium chloride  10 mEq Intravenous Q1 Hr x 3  . vancomycin  1,000 mg Intravenous Q8H  . DISCONTD: piperacillin-tazobactam (ZOSYN)  IV  3.375 g Intravenous Q8H   Continuous Infusions:   . fat emulsion 250 mL (01/31/12 1742)  . fat emulsion    . sodium chloride 0.9 % 1,000 mL with potassium chloride 30 mEq infusion 125 mL/hr at 02/02/12 1434  . sodium chloride 0.9 % 1,000 mL with potassium chloride 30 mEq infusion    . TPN (CLINIMIX) +/- additives 40 mL/hr at 01/31/12 1741  . TPN (CLINIMIX) +/- additives 65 mL/hr at 02/01/12 1801  . TPN (CLINIMIX) +/- additives    . DISCONTD: 0.9 % NaCl with KCl 20 mEq / L 60 mL/hr at 01/31/12 1217   PRN Meds:.diphenhydrAMINE, diphenhydrAMINE, hydrALAZINE, menthol-cetylpyridinium, naloxone, ondansetron (ZOFRAN) IV, ondansetron, phenol, sodium chloride, sodium chloride Assessment/Plan:  # Sepsis  The clinical manifestation is consistent with Sepsis including SIRS symptoms and new-discovered intra-abdominal abscess.  Patient shows some good responds to IVF and broaden spectrum ABX Tx and his WBC trending down.  However, he spiked fever at 100.8 last night and his Blood culture 1/2 showed GPC in clusters>>>concerning MRSA infection given his heath care acquired intra abdominal abscess.   Plan  - Follow up with blood cultures  - IVF hydration  - Broaden spectrum ABX Zosyn started on 3/18 and Vancomycin added for ? MRSA - monitor him closely for s/s severe sepsis or septic shock. - low threshold for transfer to ICU should he becomes  hemodynamically unstable  -  surgical service to follow   # Abd abscess s/p ileocecectomy 3/12  POD day #8, less nausea, passing a little gas, abd soft with tenderness relatively controlled by PCA.  - Surgical service recs >>>clamp NG tube>>>patient c/o nausea at 1700 and NG suction again per nurse report   - Surgery to follow   # Pyuria  Foley D/C'd POD#1, Patient c/o urinary burning sensation and UA showed positive Nitrates, small leukocytes, WBC 02/HPF  - Ucx shows no growth  # Ketonuria  No Acidosis or gap, likely starvation induced. TPN just started.  Will monitor.   # Hypokalemia, hypomagnesemia  High risk for above problems due to n.p.o. status, NG tube suctioning, and TPN infusion.  -Will monitor closely  -K. is 3.3 today, will replace  # DM, type II  -Oral anti-hyperglycemics medications are on hold since admission  -NPO andTPN started  -Monitor CBGs  -Lantus 10 units daily   # Crohn disease  Patient has had 5 years of Crohn's disease and presented with flareup on this admission. Status post ileocecectomy on 3/12.  -Will discuss with GI Dr. Paulita Fujita for outpatient medical management once his acute issue resolved      LOS: 13 days   Jared [name] 02/02/2012, 3:52 PM

## 2012-02-03 LAB — COMPREHENSIVE METABOLIC PANEL
ALT: 17 U/L (ref 0–53)
AST: 20 U/L (ref 0–37)
Albumin: 2.1 g/dL — ABNORMAL LOW (ref 3.5–5.2)
Alkaline Phosphatase: 106 U/L (ref 39–117)
BUN: 14 mg/dL (ref 6–23)
CO2: 31 mEq/L (ref 19–32)
Calcium: 8.3 mg/dL — ABNORMAL LOW (ref 8.4–10.5)
Chloride: 98 mEq/L (ref 96–112)
Creatinine, Ser: 0.91 mg/dL (ref 0.50–1.35)
GFR calc Af Amer: >90 mL/min (ref >90)
GFR calc non Af Amer: >90 mL/min (ref >90)
Glucose, Bld: 147 mg/dL — ABNORMAL HIGH (ref 70–99)
Potassium: 3.9 mEq/L (ref 3.5–5.1)
Sodium: 137 mEq/L (ref 135–145)
Total Bilirubin: 1.1 mg/dL (ref 0.3–1.2)
Total Protein: 6.1 g/dL (ref 6.0–8.3)

## 2012-02-03 LAB — CBC
HCT: 36.4 % — ABNORMAL LOW (ref 39.0–52.0)
Hemoglobin: 12.1 g/dL — ABNORMAL LOW (ref 13.0–17.0)
MCH: 28.5 pg (ref 26.0–34.0)
MCHC: 33.2 g/dL (ref 30.0–36.0)
MCV: 85.6 fL (ref 78.0–100.0)
Platelets: 275 10*3/uL (ref 150–400)
RBC: 4.25 MIL/uL (ref 4.22–5.81)
RDW: 14.1 % (ref 11.5–15.5)
WBC: 9.2 10*3/uL (ref 4.0–10.5)

## 2012-02-03 LAB — GLUCOSE, CAPILLARY
Glucose-Capillary: 66 mg/dL — ABNORMAL LOW (ref 70–99)
Glucose-Capillary: 72 mg/dL (ref 70–99)
Glucose-Capillary: 73 mg/dL (ref 70–99)
Glucose-Capillary: 76 mg/dL (ref 70–99)
Glucose-Capillary: 83 mg/dL (ref 70–99)
Glucose-Capillary: 89 mg/dL (ref 70–99)
Glucose-Capillary: 98 mg/dL (ref 70–99)

## 2012-02-03 LAB — URINE CULTURE
Colony Count: NO GROWTH
Culture  Setup Time: 201303200400
Culture: NO GROWTH

## 2012-02-03 LAB — CULTURE, BLOOD (ROUTINE X 2): Culture  Setup Time: 201303181421

## 2012-02-03 LAB — MAGNESIUM: Magnesium: 2.4 mg/dL (ref 1.5–2.5)

## 2012-02-03 LAB — PHOSPHORUS: Phosphorus: 4.2 mg/dL (ref 2.3–4.6)

## 2012-02-03 MED ORDER — HYDROMORPHONE 0.3 MG/ML IV SOLN
INTRAVENOUS | Status: AC
  Filled 2012-02-03: qty 25

## 2012-02-03 MED ORDER — LIDOCAINE VISCOUS 2 % MT SOLN
20.0000 mL | Freq: Four times a day (QID) | OROMUCOSAL | Status: DC
  Administered 2012-02-03 – 2012-02-06 (×11): 20 mL via OROMUCOSAL
  Administered 2012-02-07: 15 mL via OROMUCOSAL
  Administered 2012-02-07 (×2): 20 mL via OROMUCOSAL
  Administered 2012-02-08 (×2): 15 mL via OROMUCOSAL
  Filled 2012-02-03 (×26): qty 20

## 2012-02-03 MED ORDER — INSULIN REGULAR HUMAN 100 UNIT/ML IJ SOLN
INTRAVENOUS | Status: DC
  Administered 2012-02-03: 18:00:00 via INTRAVENOUS
  Filled 2012-02-03: qty 2520

## 2012-02-03 MED ORDER — MAGIC MOUTHWASH
5.0000 mL | Freq: Four times a day (QID) | ORAL | Status: DC
  Administered 2012-02-03 – 2012-02-08 (×19): 5 mL via ORAL
  Filled 2012-02-03 (×26): qty 5

## 2012-02-03 NOTE — Progress Notes (Signed)
Internal Medicine Attending  Date: 02/03/2012  Patient name: [name] Medical record number: [id number] Date of birth: September 28, 1966 Age: 46 y.o. Gender: male  I saw and evaluated the patient. I reviewed the resident's note by Dr. Nicoletta Dress and I agree with the resident's findings and plans as documented in her progress note.  Jared [name] feels slightly better today. He notes a decrease in his nausea, decrease in his subjective abdominal bloating, and a slight decrease in his pain. Exam continues to note some tenderness now worse in the right lower quadrant and hypoactive to absent bowel sounds. His white blood cell count is markedly improved with the antibiotic therapy and we're waiting identification of the gram-positive cocci in clusters before making a decision on vancomycin. Surgery continues to closely follow. We will continue supportive care.

## 2012-02-03 NOTE — Progress Notes (Signed)
9 Days Post-Op  Subjective: Feeling better.  +flatus.  Tolerated NG clamped  Objective: Vital signs in last 24 hours: Temp:  [97.9 F (36.6 C)-100.3 F (37.9 C)] 99.1 F (37.3 C) (03/21 0700) Pulse Rate:  [101-107] 101  (03/21 0700) Resp:  [14-23] 17  (03/21 0700) BP: (112-126)/(64-73) 126/73 mmHg (03/21 0700) SpO2:  [96 %-99 %] 99 % (03/21 0800) Weight:  [195 lb 12.8 oz (88.814 kg)] 195 lb 12.8 oz (88.814 kg) (03/21 0700) Last BM Date: 01/27/12  Intake/Output from previous day: 03/20 0701 - 03/21 0700 In: 1270 [I.V.:500; IV Piggyback:300; TPN:470] Out: 2325 [Urine:1525; Emesis/NG output:800] Intake/Output this shift: Total I/O In: 20 [TPN:20] Out: 225 [Urine:225]  Abdomen softer, much less tender, good bowel sounds  Lab Results:   [facility] 02/03/12 0525 02/02/12 0535  WBC 9.2 12.4*  HGB 12.1* 13.5  HCT 36.4* 39.7  PLT 275 295   BMET  Basename 02/03/12 0525 02/02/12 0535  NA 137 137  K 3.9 3.3*  CL 98 95*  CO2 31 31  GLUCOSE 147* 109*  BUN 14 14  CREATININE 0.91 0.91  CALCIUM 8.3* 8.9   PT/INR No results found for this basename: LABPROT:2,INR:2 in the last 72 hours ABG No results found for this basename: PHART:2,PCO2:2,PO2:2,HCO3:2 in the last 72 hours  Studies/Results: No results found.  Anti-infectives: Anti-infectives     Start     Dose/Rate Route Frequency Ordered Stop   02/02/12 1400   piperacillin-tazobactam (ZOSYN) IVPB 3.375 g        3.375 g 12.5 mL/hr over 240 Minutes Intravenous 3 times per day 02/02/12 0532     02/02/12 1200   vancomycin (VANCOCIN) IVPB 1000 mg/200 mL premix        1,000 mg 200 mL/hr over 60 Minutes Intravenous Every 8 hours 02/02/12 1059     01/31/12 1100   piperacillin-tazobactam (ZOSYN) IVPB 3.375 g  Status:  Discontinued        3.375 g 12.5 mL/hr over 240 Minutes Intravenous Every 8 hours 01/31/12 0957 02/02/12 0532   01/24/12 1722   ertapenem (INVANZ) 1 g in sodium chloride 0.9 % 50 mL IVPB        1 g 100 mL/hr  over 30 Minutes Intravenous 60 min pre-op 01/24/12 1722 01/25/12 0948          Assessment/Plan: s/p Procedure(s) (LRB): EXPLORATORY LAPAROTOMY (N/A) SMALL BOWEL RESECTION (N/A)  WBC now normal.  Ileus slowly resolving Will d/c ng and start sips Continue iv antibiotics  LOS: 14 days    Jaana Brodt A 02/03/2012

## 2012-02-03 NOTE — Progress Notes (Addendum)
PARENTERAL NUTRITION CONSULT NOTE - FOLLOW UP  Pharmacy Consult for TPN Indication: post-op ileus s/p SBR  No Known Allergies  Patient Measurements: Height: 6' 2"  (188 cm) Weight: 195 lb 12.8 oz (88.814 kg) IBW/kg (Calculated) : 82.2   Vital Signs: Temp: 99.1 F (37.3 C) (03/21 0700) BP: 126/73 mmHg (03/21 0700) Pulse Rate: 101  (03/21 0700) Intake/Output from previous day: 03/20 0701 - 03/21 0700 In: 1270 [I.V.:500; IV Piggyback:300; TPN:470] Out: 2325 [Urine:1525; Emesis/NG output:800] Intake/Output from this shift: Total I/O In: 20 [TPN:20] Out: 225 [Urine:225]  Labs:  [facility] 02/03/12 0525 02/02/12 0535 02/01/12 0550  WBC 9.2 12.4* 17.5*  HGB 12.1* 13.5 13.8  HCT 36.4* 39.7 40.0  PLT 275 295 271  APTT -- -- --  INR -- -- --     Basename 02/03/12 0525 02/02/12 0535 02/01/12 0550  NA 137 137 138  K 3.9 3.3* 3.2*  CL 98 95* 94*  CO2 31 31 32  GLUCOSE 147* 109* 185*  BUN 14 14 12   CREATININE 0.91 0.91 0.87  LABCREA -- -- --  CREAT24HRUR -- -- --  CALCIUM 8.3* 8.9 8.7  MG 2.4 2.6* 2.3  PHOS 4.2 -- --  PROT 6.1 -- --  ALBUMIN 2.1* -- --  AST 20 -- --  ALT 17 -- --  ALKPHOS 106 -- --  BILITOT 1.1 -- --  BILIDIR -- -- --  IBILI -- -- --  PREALBUMIN -- -- --  TRIG -- -- --  CHOLHDL -- -- --  CHOL -- -- --   Estimated Creatinine Clearance: 119.2 ml/min (by C-G formula based on Cr of 0.91).   Medications:  Scheduled:     . dextrose      . enoxaparin  40 mg Subcutaneous Q24H  . HYDROmorphone PCA 0.3 mg/mL   Intravenous Q4H  . HYDROmorphone PCA 0.3 mg/mL      . insulin aspart  0-20 Units Subcutaneous Q4H  . insulin glargine  10 Units Subcutaneous Daily  . lidocaine  20 mL Mouth/Throat Q6H  . magic mouthwash  5 mL Oral Q6H  . piperacillin-tazobactam (ZOSYN)  IV  3.375 g Intravenous Q8H  . potassium chloride  10 mEq Intravenous Q1 Hr x 3  . vancomycin  1,000 mg Intravenous Q8H    Basename 02/03/12 0818 02/03/12 0410 02/03/12 0011  GLUCAP 76 72  83   Insulin Requirements in the past 24 hours:  No SSI required; 164 units insulin in TNA, 10 units lantus qday  Nutritional Goals:  2100-2300 kcal, 115-130 gm protein daily  Current Nutrition: Clinimix + lipids = 2269 kcal + 126 gm protein Clinimix without lipids = 1789 kcal + 126 gm protein Daily average per week = 1995 kcal + 126 gm protein (meeting 95% of kcal and 100% of protein needs)  Assessment: 59 YOM with post-op ileus s/p SBR (POD#9) to continue on TPN.  Noted CT showing development of abscess.  Passed some flatus overnight.  Had some n/v with NG clamped. Plan to remove NG today and start sips with ileus slowly resolving per MD.  Evaristo Bury: hypokalemia, mild hypochloremia - Endo:  CBGs well controlled with 164 units in TPN + SSI + Lantus 10 units SQ daily; perhaps slightly low - CBG of 60 required 1 amp D50 - Renal:  SCr stable, UOP appears improved this am - LFTs / tbili / TG / TC WNL   Plan:  - Continue Clinimix E 5/15 goal rate of 105 ml/hr - Lipids 10 ml/hr  on MWF d/t national shortage - Multivitamins and trace elements on MWF - Back off slightly on insulin in TNA 164 --> 155 units in TPN - F/U treatment plans, CBG in am; BMP am   Bryson Ha L. Amada Jupiter, PharmD, [place] Clinical Pharmacist Pager: [phone] 02/03/2012 10:17 AM

## 2012-02-03 NOTE — Progress Notes (Signed)
Subjective: Patient states that he was nauseated after his NG tube was clamped for 4 hours yesterday, and his NG suction was resumed at 1700 pm.  800 ml NG suction noted overnight.  Patient denies N/V after his NG tube resumed.  Patient reports that his abd pain is well controlled by PCA pump, and he feels that his abdomen is less distended. Endorses passing gas x 1 last night.  Objective: Vital signs in last 24 hours: Filed Vitals:   02/03/12 0421 02/03/12 0700 02/03/12 0800 02/03/12 0929  BP:  126/73    Pulse:  101    Temp:  99.1 F (37.3 C)    TempSrc:      Resp: 15 17  20   Height:      Weight:  195 lb 12.8 oz (88.814 kg)    SpO2: 97% 99% 99% 99%   Weight change:   Intake/Output Summary (Last 24 hours) at 02/03/12 1153 Last data filed at 02/03/12 1100  Gross per 24 hour  Intake   1385 ml  Output   2550 ml  Net  -1165 ml   Physical Exam: General: Acute ill appearing  Nose: NG tube with suction, dark brownish output noted.  Lungs: Bibasilar lung sounds diminished. No wheezing, rales or rhonchi noted.  Heart: RRR, Sinus tachycardia HR 110's, no M/G/R  Abdomen: soft, tenderness noted at LLQ with. Mild to moderate distension noted. No BS  Ext: No edema. SCDs on  Lab Results: Basic Metabolic Panel:  Lab 25/42/70 0525 02/02/12 0535 01/31/12 0635  Evander Macaraeg 137 137 --  K 3.9 3.3* --  CL 98 95* --  CO2 31 31 --  GLUCOSE 147* 109* --  BUN 14 14 --  CREATININE 0.91 0.91 --  CALCIUM 8.3* 8.9 --  MG 2.4 2.6* --  PHOS 4.2 -- 3.7   Liver Function Tests:  Lab 02/03/12 0525 01/31/12 0635  AST 20 18  ALT 17 16  ALKPHOS 106 93  BILITOT 1.1 1.7*  PROT 6.1 6.5  ALBUMIN 2.1* 2.0*   CBC:  Lab 02/03/12 0525 02/02/12 0535 01/31/12 0635 01/29/12 0600  WBC 9.2 12.4* -- --  NEUTROABS -- -- 20.8* 10.6*  HGB 12.1* 13.5 -- --  HCT 36.4* 39.7 -- --  MCV 85.6 84.5 -- --  PLT 275 295 -- --   CBG:  Lab 02/03/12 0818 02/03/12 0410 02/03/12 0011 02/02/12 2201 02/02/12 2047 02/02/12  1636  GLUCAP 76 72 83 103* 69* 111*   Fasting Lipid Panel:  Lab 01/31/12 0635  CHOL 148  HDL --  LDLCALC --  TRIG 146  CHOLHDL --  LDLDIRECT --   Urinalysis:  Lab 01/31/12 1116  COLORURINE ORANGE*  LABSPEC 1.035*  PHURINE 6.0  GLUCOSEU >1000*  HGBUR NEGATIVE  BILIRUBINUR MODERATE*  KETONESUR >80*  PROTEINUR 30*  UROBILINOGEN 2.0*  NITRITE POSITIVE*  LEUKOCYTESUR SMALL*    Micro Results: Recent Results (from the past 240 hour(s))  SURGICAL PCR SCREEN     Status: Normal   Collection Time   01/24/12  8:45 PM      Component Value Range Status Comment   MRSA, PCR NEGATIVE  NEGATIVE  Final    Staphylococcus aureus NEGATIVE  NEGATIVE  Final   CULTURE, BLOOD (ROUTINE X 2)     Status: Normal (Preliminary result)   Collection Time   01/31/12 10:51 AM      Component Value Range Status Comment   Specimen Description BLOOD LEFT ARM   Final    Special Requests  Final    Value: BOTTLES DRAWN AEROBIC AND ANAEROBIC AERO 10CC ANA 5CC   Culture  Setup Time [id number]   Final    Culture     Final    Value:        BLOOD CULTURE RECEIVED NO GROWTH TO DATE CULTURE WILL BE HELD FOR 5 DAYS BEFORE ISSUING A FINAL NEGATIVE REPORT   Report Status PENDING   Incomplete   CULTURE, BLOOD (ROUTINE X 2)     Status: Normal   Collection Time   01/31/12 10:56 AM      Component Value Range Status Comment   Specimen Description BLOOD LEFT HAND   Final    Special Requests BOTTLES DRAWN AEROBIC ONLY 5CC   Final    Culture  Setup Time [id number]   Final    Culture     Final    Value: STAPHYLOCOCCUS SPECIES (COAGULASE NEGATIVE)     Note: THE SIGNIFICANCE OF ISOLATING THIS ORGANISM FROM A SINGLE SET OF BLOOD CULTURES WHEN MULTIPLE SETS ARE DRAWN IS UNCERTAIN. PLEASE NOTIFY THE MICROBIOLOGY DEPARTMENT WITHIN ONE WEEK IF SPECIATION AND SENSITIVITIES ARE REQUIRED.     Note: Gram Stain Report Called to,Read Back By and Verified With: [name] @0135  ON 02/02/2012 BY MCLET   Report Status  02/03/2012 FINAL   Final   URINE CULTURE     Status: Normal   Collection Time   01/31/12 11:16 AM      Component Value Range Status Comment   Specimen Description URINE, CLEAN CATCH   Final    Special Requests ADDED 02/01/12 1539   Final    Culture  Setup Time [id number]   Final    Colony Count NO GROWTH   Final    Culture NO GROWTH   Final    Report Status 02/02/2012 FINAL   Final   URINE CULTURE     Status: Normal   Collection Time   02/02/12  3:25 AM      Component Value Range Status Comment   Specimen Description URINE, CLEAN CATCH   Final    Special Requests NONE   Final    Culture  Setup Time [id number]   Final    Colony Count NO GROWTH   Final    Culture NO GROWTH   Final    Report Status 02/03/2012 FINAL   Final    Studies/Results: No results found. Medications: I have reviewed the patient's current medications. Scheduled Meds:   . dextrose      . enoxaparin  40 mg Subcutaneous Q24H  . HYDROmorphone PCA 0.3 mg/mL   Intravenous Q4H  . HYDROmorphone PCA 0.3 mg/mL      . insulin aspart  0-20 Units Subcutaneous Q4H  . insulin glargine  10 Units Subcutaneous Daily  . lidocaine  20 mL Mouth/Throat Q6H  . magic mouthwash  5 mL Oral Q6H  . piperacillin-tazobactam (ZOSYN)  IV  3.375 g Intravenous Q8H  . potassium chloride  10 mEq Intravenous Q1 Hr x 3  . DISCONTD: vancomycin  1,000 mg Intravenous Q8H   Continuous Infusions:   . fat emulsion 250 mL (02/02/12 1710)  . sodium chloride 0.9 % 1,000 mL with potassium chloride 30 mEq infusion 75 mL/hr at 02/03/12 1100  . sodium chloride 0.9 % 1,000 mL with potassium chloride 30 mEq infusion 125 mL/hr at 02/03/12 0927  . TPN (CLINIMIX) +/- additives 65 mL/hr at 02/01/12 1801  . TPN (CLINIMIX) +/- additives    . TPN (CLINIMIX) +/-  additives 105 mL/hr at 02/03/12 1100   PRN Meds:.diphenhydrAMINE, diphenhydrAMINE, hydrALAZINE, menthol-cetylpyridinium, naloxone, ondansetron (ZOFRAN) IV, ondansetron, phenol, sodium chloride,  sodium chloride Assessment/Plan:  # Sepsis    Clinically improving with less N/V, subjectively less abd distention. Still have low grade temp 100.3 last night.  WBC trending down 24.7>>17.5>>12.4>>9.2.  Day # 4>>Zosyn Day # 2>> Vanc>>D/C'd on 3/21   his Blood culture 1/2 showed GPC in clusters>>>coagulase negative today 1/2, likely contamination>>>Vanco D/C'd  Plan  - Follow up with blood cultures  - IVF hydration  - Broaden spectrum ABX Zosyn - he is clinically improved with medical treatment. Will monitor him closely for s/s severe sepsis or septic shock.  - surgical service to follow >>> conservative treatment . No surgery for now.  # Abd abscess s/p ileocecectomy 3/12  POD day #9  - Surgery to follow   # Pyuria Foley D/C'd POD#1, Patient c/o urinary burning sensation and UA showed positive Nitrates, small leukocytes, WBC 02/HPF  - Ucx shows no growth  # Ketonuria  No Acidosis or gap, likely starvation induced. TPN just started.  Will monitor.  # Hypokalemia, hypomagnesemia  High risk for above problems due to n.p.o. status, NG tube suctioning, and TPN infusion.  -Will monitor closely and replace accordingly  # DM, type II  -Oral anti-hyperglycemics medications are on hold since admission  -NPO andTPN started  -Monitor CBGs  -Lantus 10 units daily   # Crohn disease  Patient has had 5 years of Crohn's disease and presented with flareup on this admission. Status post ileocecectomy on 3/12.  -Will discuss with GI Dr. Paulita Fujita for outpatient medical management once his acute issue resolved       LOS: 14 days   Jared [name] 02/03/2012, 11:53 AM

## 2012-02-03 NOTE — Progress Notes (Signed)
Occupational Therapy Treatment Patient Details Name: Jared [name] MRN: [id number] DOB: January 27, 1966 Today's Date: 02/03/2012  OT Assessment/Plan OT Assessment/Plan Comments on Treatment Session: Pt. very motivated.  Pt. progressing with level of independence with functional mobility and simple ADLs.  Pt. limited by pain OT Plan: Discharge plan remains appropriate OT Frequency: Min 2X/week Follow Up Recommendations: No OT follow up Equipment Recommended: Rolling walker with 5" wheels;3 in 1 bedside comode OT Goals ADL Goals Pt Will Perform Grooming: with modified independence;Standing at sink ADL Goal: Grooming - Progress: Goal set today ADL Goal: Toilet Transfer - Progress: Progressing toward goals ADL Goal: Additional Goal #1 - Progress: Progressing toward goals  OT Treatment Precautions/Restrictions  Restrictions Weight Bearing Restrictions: No   ADL ADL Eating/Feeding: Independent;Performed (drinking from cup) Grooming: Performed;Wash/dry hands;Wash/dry face;Teeth care (min guard assist) Where Assessed - Grooming: Standing at sink Toilet Transfer: Simulated (min guard assist) Toilet Transfer Method: Counselling psychologist: Comfort height toilet;Grab bars Toileting - Clothing Manipulation: Simulated (min guard) Where Assessed - Toileting Clothing Manipulation: Standing Equipment Used: Rolling walker Ambulation Related to ADLs: Pt. ambulated with min guard assist ADL Comments: Pt. progressing with activity Mobility  Bed Mobility Bed Mobility: Yes Supine to Sit: 5: Supervision;HOB elevated (Comment degrees) (50) Transfers Transfers: Yes Sit to Stand: From chair/3-in-1;From bed;With armrests;With upper extremity assist (min guard assist) Stand to Sit: 4: Min assist;To bed;To chair/3-in-1 (min guard assist) Exercises    End of Session OT - End of Session Activity Tolerance: Patient limited by pain Patient left: in chair;with call bell in reach Nurse  Communication: Mobility status for ambulation General Behavior During Session: [name] for tasks performed Cognition: [name] for tasks performed  Jared [name], Ellard Artis M  02/03/2012, 4:34 PM

## 2012-02-04 LAB — GLUCOSE, CAPILLARY
Glucose-Capillary: 136 mg/dL — ABNORMAL HIGH (ref 70–99)
Glucose-Capillary: 139 mg/dL — ABNORMAL HIGH (ref 70–99)
Glucose-Capillary: 145 mg/dL — ABNORMAL HIGH (ref 70–99)
Glucose-Capillary: 283 mg/dL — ABNORMAL HIGH (ref 70–99)
Glucose-Capillary: 60 mg/dL — ABNORMAL LOW (ref 70–99)
Glucose-Capillary: 64 mg/dL — ABNORMAL LOW (ref 70–99)
Glucose-Capillary: 68 mg/dL — ABNORMAL LOW (ref 70–99)
Glucose-Capillary: 84 mg/dL (ref 70–99)
Glucose-Capillary: 86 mg/dL (ref 70–99)

## 2012-02-04 LAB — BASIC METABOLIC PANEL
BUN: 13 mg/dL (ref 6–23)
CO2: 28 mEq/L (ref 19–32)
Calcium: 8.3 mg/dL — ABNORMAL LOW (ref 8.4–10.5)
Chloride: 100 mEq/L (ref 96–112)
Creatinine, Ser: 0.94 mg/dL (ref 0.50–1.35)
GFR calc Af Amer: >90 mL/min (ref >90)
GFR calc non Af Amer: >90 mL/min (ref >90)
Glucose, Bld: 98 mg/dL (ref 70–99)
Potassium: 4.2 mEq/L (ref 3.5–5.1)
Sodium: 135 mEq/L (ref 135–145)

## 2012-02-04 LAB — CBC
HCT: 38.1 % — ABNORMAL LOW (ref 39.0–52.0)
Hemoglobin: 12.4 g/dL — ABNORMAL LOW (ref 13.0–17.0)
MCH: 28.4 pg (ref 26.0–34.0)
MCHC: 32.5 g/dL (ref 30.0–36.0)
MCV: 87.4 fL (ref 78.0–100.0)
Platelets: 295 10*3/uL (ref 150–400)
RBC: 4.36 MIL/uL (ref 4.22–5.81)
RDW: 14.1 % (ref 11.5–15.5)
WBC: 9.5 10*3/uL (ref 4.0–10.5)

## 2012-02-04 LAB — MAGNESIUM: Magnesium: 2.4 mg/dL (ref 1.5–2.5)

## 2012-02-04 MED ORDER — FAT EMULSION 20 % IV EMUL
250.0000 mL | INTRAVENOUS | Status: AC
  Administered 2012-02-04: 250 mL via INTRAVENOUS
  Filled 2012-02-04: qty 250

## 2012-02-04 MED ORDER — HYDROMORPHONE 0.3 MG/ML IV SOLN
INTRAVENOUS | Status: AC
  Administered 2012-02-04: 1.8 mg
  Filled 2012-02-04: qty 25

## 2012-02-04 MED ORDER — BOOST / RESOURCE BREEZE PO LIQD
1.0000 | Freq: Every day | ORAL | Status: DC
  Administered 2012-02-04 – 2012-02-08 (×5): 1 via ORAL

## 2012-02-04 MED ORDER — DEXTROSE 50 % IV SOLN
INTRAVENOUS | Status: AC
  Administered 2012-02-04 (×2): 25 mL
  Filled 2012-02-04: qty 50

## 2012-02-04 MED ORDER — HYDROMORPHONE 0.3 MG/ML IV SOLN
INTRAVENOUS | Status: AC
  Administered 2012-02-04: 02:00:00
  Filled 2012-02-04: qty 25

## 2012-02-04 MED ORDER — TRACE MINERALS CR-CU-MN-SE-ZN 10-1000-500-60 MCG/ML IV SOLN
INTRAVENOUS | Status: AC
  Administered 2012-02-04: 18:00:00 via INTRAVENOUS
  Filled 2012-02-04: qty 2520

## 2012-02-04 MED ORDER — DEXTROSE 10 % IV SOLN
INTRAVENOUS | Status: AC
  Administered 2012-02-04: 500 mL via INTRAVENOUS

## 2012-02-04 NOTE — Progress Notes (Signed)
a  CARE MANAGEMENT NOTE 02/04/2012  Patient:  Jared [name], Jared [name]   Account Number:  [id number]  Date Initiated:  01/24/2012  Documentation initiated by:  Jasmine Pang  Subjective/Objective Assessment:   Order for assistance with medication.     Action/Plan:   Noted that pt received assistance on last visit in Aug 2012, will attempt to assist.  02/04/2012 Continuing to follow.   Anticipated DC Date:  02/09/2012   Anticipated DC Plan:  [place]         Choice offered to / List presented to:             Status of service:  In process, will continue to follow Medicare Important Message given?   (If response is "NO", the following Medicare IM given date fields will be blank) Date Medicare IM given:   Date Additional Medicare IM given:    Discharge Disposition:  [place]  Per UR Regulation:    If discussed at Long Length of Stay Meetings, dates discussed:   02/02/2012    Comments:  01-31-12 NGT , TPN, IVF, WBC elevated CT abd today to check for abscess , IV antibiotics started. Magdalen Spatz RN BS N     01/28/2012 Per Marcelino Scot of [facility], pt is active with that agency, a pt at [facility] and has an orange card. Jasmine Pang RN MPH Case Manager [phone]

## 2012-02-04 NOTE — Progress Notes (Signed)
PT Cancellation Note  Treatment cancelled today due to patient's refusal to participate. Pt reported he has been sitting in a chair for 2 hours and is tired. Pt stated he did walk yesterday in the hall with assistance. Pt said he would walk again this afternoon with nursing staff. Therapy to follow-up early next week.  Lelon Mast 02/04/2012, 11:00 AM

## 2012-02-04 NOTE — Progress Notes (Signed)
Internal Medicine Attending  Date: 02/04/2012  Patient name: Jared [name] Medical record number: [id number] Date of birth: 04/21/1966 Age: 46 y.o. Gender: male  I saw and evaluated the patient. I reviewed the resident's note by Dr. Nicoletta Dress and I agree with the resident's findings and plans as documented in her progress note.  [name] passed much gas and had a bowel movement yesterday. The NG tube was removed and he has had no problems with nausea. Overall he is feeling much improved. General surgery assessed the patient and would like to proceed with advancing his diet to clear liquids. We will continue antibiotic therapy minus the vancomycin given the apparent contaminated blood culture. If he were to spike a fever or clinically worsen he would need further evaluation of his abdomen with a CT scan to assess the status of the perianastomotic inflammation.

## 2012-02-04 NOTE — Progress Notes (Signed)
Subjective: Patient states that he has one episode loose BM last night and passed a lot of flatulence.  He reports that he had a lot of abdominal cramping when he passed flatulence.  Hs NG was removed yesterday and patient tolerated well. No Nausea or vomiting, His abd pain is well controlled by PCA.  Nursing staff reported low grade temp 100.3 this am.    Objective: Vital signs in last 24 hours: Filed Vitals:   02/04/12 0616 02/04/12 0640 02/04/12 0710 02/04/12 1351  BP: 131/69   126/64  Pulse: 102   77  Temp: 100.3 F (37.9 C) 99.4 F (37.4 C)  98.8 F (37.1 C)  TempSrc:      Resp: 20   22  Height:      Weight:   199 lb 4.7 oz (90.4 kg)   SpO2: 99%      Weight change:   Intake/Output Summary (Last 24 hours) at 02/04/12 1359 Last data filed at 02/04/12 1100  Gross per 24 hour  Intake   1240 ml  Output    676 ml  Net    564 ml   Physical Exam: General: Acute ill appearing  Nose: NG tube with suction, dark brownish output noted.  Lungs: Bibasilar lung sounds diminished. No wheezing, rales or rhonchi noted.  Heart: RRR, Sinus tachycardia HR 110's, no M/G/R  Abdomen: soft, tenderness noted at LLQ with. Mild to moderate distension noted. No BS  Ext: No edema. SCDs on  Lab Results: Basic Metabolic Panel:  Lab 51/88/41 0500 02/03/12 0525 01/31/12 0635  Jared [name] 135 137 --  K 4.2 3.9 --  CL 100 98 --  CO2 28 31 --  GLUCOSE 98 147* --  BUN 13 14 --  CREATININE 0.94 0.91 --  CALCIUM 8.3* 8.3* --  MG 2.4 2.4 --  PHOS -- 4.2 3.7   Liver Function Tests:  Lab 02/03/12 0525 01/31/12 0635  AST 20 18  ALT 17 16  ALKPHOS 106 93  BILITOT 1.1 1.7*  PROT 6.1 6.5  ALBUMIN 2.1* 2.0*   CBC:  Lab 02/04/12 1145 02/03/12 0525 01/31/12 0635 01/29/12 0600  WBC 9.5 9.2 -- --  NEUTROABS -- -- 20.8* 10.6*  HGB 12.4* 12.1* -- --  HCT 38.1* 36.4* -- --  MCV 87.4 85.6 -- --  PLT 295 275 -- --   CBG:  Lab 02/04/12 1136 02/04/12 0846 02/04/12 0522 02/04/12 0420 02/04/12 0122  02/04/12 0007  GLUCAP 68* 139* 86 64* 136* 60*   Fasting Lipid Panel:  Lab 01/31/12 0635  CHOL 148  HDL --  LDLCALC --  TRIG 146  CHOLHDL --  LDLDIRECT --   Urinalysis:  Lab 01/31/12 1116  COLORURINE ORANGE*  LABSPEC 1.035*  PHURINE 6.0  GLUCOSEU >1000*  HGBUR NEGATIVE  BILIRUBINUR MODERATE*  KETONESUR >80*  PROTEINUR 30*  UROBILINOGEN 2.0*  NITRITE POSITIVE*  LEUKOCYTESUR SMALL*   Micro Results: Recent Results (from the past 240 hour(s))  CULTURE, BLOOD (ROUTINE X 2)     Status: Normal (Preliminary result)   Collection Time   01/31/12 10:51 AM      Component Value Range Status Comment   Specimen Description BLOOD LEFT ARM   Final    Special Requests     Final    Value: BOTTLES DRAWN AEROBIC AND ANAEROBIC AERO 10CC ANA 5CC   Culture  Setup Time [id number]   Final    Culture     Final    Value:  BLOOD CULTURE RECEIVED NO GROWTH TO DATE CULTURE WILL BE HELD FOR 5 DAYS BEFORE ISSUING A FINAL NEGATIVE REPORT   Report Status PENDING   Incomplete   CULTURE, BLOOD (ROUTINE X 2)     Status: Normal   Collection Time   01/31/12 10:56 AM      Component Value Range Status Comment   Specimen Description BLOOD LEFT HAND   Final    Special Requests BOTTLES DRAWN AEROBIC ONLY 5CC   Final    Culture  Setup Time [id number]   Final    Culture     Final    Value: STAPHYLOCOCCUS SPECIES (COAGULASE NEGATIVE)     Note: THE SIGNIFICANCE OF ISOLATING THIS ORGANISM FROM A SINGLE SET OF BLOOD CULTURES WHEN MULTIPLE SETS ARE DRAWN IS UNCERTAIN. PLEASE NOTIFY THE MICROBIOLOGY DEPARTMENT WITHIN ONE WEEK IF SPECIATION AND SENSITIVITIES ARE REQUIRED.     Note: Gram Stain Report Called to,Read Back By and Verified With: [name] @0135  ON 02/02/2012 BY MCLET   Report Status 02/03/2012 FINAL   Final   URINE CULTURE     Status: Normal   Collection Time   01/31/12 11:16 AM      Component Value Range Status Comment   Specimen Description URINE, CLEAN CATCH   Final    Special Requests  ADDED 02/01/12 1539   Final    Culture  Setup Time [id number]   Final    Colony Count NO GROWTH   Final    Culture NO GROWTH   Final    Report Status 02/02/2012 FINAL   Final   URINE CULTURE     Status: Normal   Collection Time   02/02/12  3:25 AM      Component Value Range Status Comment   Specimen Description URINE, CLEAN CATCH   Final    Special Requests NONE   Final    Culture  Setup Time [id number]   Final    Colony Count NO GROWTH   Final    Culture NO GROWTH   Final    Report Status 02/03/2012 FINAL   Final    Studies/Results: No results found. Medications: I have reviewed the patient's current medications. Scheduled Meds:   . dextrose      . enoxaparin  40 mg Subcutaneous Q24H  . feeding supplement  1 Container Oral Daily  . HYDROmorphone PCA 0.3 mg/mL   Intravenous Q4H  . HYDROmorphone PCA 0.3 mg/mL      . insulin aspart  0-20 Units Subcutaneous Q4H  . lidocaine  20 mL Mouth/Throat Q6H  . magic mouthwash  5 mL Oral Q6H  . piperacillin-tazobactam (ZOSYN)  IV  3.375 g Intravenous Q8H  . DISCONTD: insulin glargine  10 Units Subcutaneous Daily   Continuous Infusions:   . dextrose 500 mL (02/04/12 1358)  . fat emulsion 250 mL (02/02/12 1710)  . fat emulsion    . sodium chloride 0.9 % 1,000 mL with potassium chloride 30 mEq infusion 75 mL/hr at 02/04/12 0645  . sodium chloride 0.9 % 1,000 mL with potassium chloride 30 mEq infusion 125 mL/hr at 02/03/12 0927  . TPN (CLINIMIX) +/- additives    . TPN (CLINIMIX) +/- additives 105 mL/hr at 02/03/12 1100  . DISCONTD: TPN (CLINIMIX) +/- additives 105 mL/hr at 02/03/12 1736   PRN Meds:.diphenhydrAMINE, diphenhydrAMINE, hydrALAZINE, menthol-cetylpyridinium, naloxone, ondansetron (ZOFRAN) IV, ondansetron, phenol, sodium chloride, sodium chloride Assessment/Plan:  # Sepsis, resolving Clinically improving with less N/V. NG removed WBC trending down 24.7>>17.5>>12.4>>9.2>9.5.  Day #  5>>Zosyn  Day # 2>> Vanc>>D/C'd on 3/21    his Blood culture 1/2 showed GPC in clusters>>>coagulase negative today 1/2, likely contamination>>>Vanco D/C'd   Plan   - IVF hydration  - Broaden spectrum ABX Zosyn  - surgical service to follow >>> conservative treatment . No surgery for now.   # Abd abscess s/p ileocecectomy 3/12  POD day #10 - tolerated well after NG removed, +BM last night -on CL  - Surgery to follow  # Pyuria  Foley D/C'd POD#1, Patient c/o urinary burning sensation and UA showed positive Nitrates, small leukocytes, WBC 02/HPF  - Ucx shows no growth   # Hypokalemia, hypomagnesemia  High risk for above problems due to n.p.o. status, NG tube suctioning, and TPN infusion.  -Will monitor closely and replace accordingly  # DM, type II  -Oral anti-hyperglycemics medications are on hold since admission  -TPN started  -Monitor CBGs>>>experience low CBG last night at 60's. Patient asymptomatic.  -D/C Lantus   # Crohn disease  Patient has had 5 years of Crohn's disease and presented with flareup on this admission. Status post ileocecectomy on 3/12.  -Will discuss with GI Dr. Paulita Fujita for outpatient medical management once his acute issue resolved       LOS: 15 days   Jared [name] 02/04/2012, 1:59 PM

## 2012-02-04 NOTE — Progress Notes (Addendum)
Nutrition Follow-up  Pt's post-op ileus slowly resolving. NGT to LIS. Tolerating sips of clears. No nausea this AM.  Patient is receiving TPN with Clinimix E 5/15 @ 105 ml/hr.  Lipids (20% IVFE @ 10 ml/hr), multivitamins, and trace elements are provided 3 times weekly (MWF) due to national backorder.  Provides 1995 kcal and 126 grams protein daily (based on weekly average).  Meets 95% minimum estimated kcal and 100% minimum estimated protein needs.  Additional IVF with 0.9% NaCl with KCl 30 mEq @ 125 ml/hr.  Diet Order:  Clear Liquids  Meds: Scheduled Meds:   . dextrose      . enoxaparin  40 mg Subcutaneous Q24H  . HYDROmorphone PCA 0.3 mg/mL   Intravenous Q4H  . HYDROmorphone PCA 0.3 mg/mL      . insulin aspart  0-20 Units Subcutaneous Q4H  . lidocaine  20 mL Mouth/Throat Q6H  . magic mouthwash  5 mL Oral Q6H  . piperacillin-tazobactam (ZOSYN)  IV  3.375 g Intravenous Q8H  . DISCONTD: insulin glargine  10 Units Subcutaneous Daily  . DISCONTD: vancomycin  1,000 mg Intravenous Q8H   Continuous Infusions:   . fat emulsion 250 mL (02/02/12 1710)  . fat emulsion    . sodium chloride 0.9 % 1,000 mL with potassium chloride 30 mEq infusion 75 mL/hr at 02/04/12 0645  . sodium chloride 0.9 % 1,000 mL with potassium chloride 30 mEq infusion 125 mL/hr at 02/03/12 0927  . TPN (CLINIMIX) +/- additives 105 mL/hr at 02/03/12 1736  . TPN (CLINIMIX) +/- additives    . TPN (CLINIMIX) +/- additives 105 mL/hr at 02/03/12 1100   PRN Meds:.diphenhydrAMINE, diphenhydrAMINE, hydrALAZINE, menthol-cetylpyridinium, naloxone, ondansetron (ZOFRAN) IV, ondansetron, phenol, sodium chloride, sodium chloride  Labs:  CMP     Component Value Date/Time   NA 135 02/04/2012 0500   K 4.2 02/04/2012 0500   CL 100 02/04/2012 0500   CO2 28 02/04/2012 0500   GLUCOSE 98 02/04/2012 0500   BUN 13 02/04/2012 0500   CREATININE 0.94 02/04/2012 0500   CALCIUM 8.3* 02/04/2012 0500   PROT 6.1 02/03/2012 0525   ALBUMIN 2.1*  02/03/2012 0525   AST 20 02/03/2012 0525   ALT 17 02/03/2012 0525   ALKPHOS 106 02/03/2012 0525   BILITOT 1.1 02/03/2012 0525   GFRNONAA >90 02/04/2012 0500   GFRAA >90 02/04/2012 0500     Intake/Output Summary (Last 24 hours) at 02/04/12 0931 Last data filed at 02/04/12 0645  Gross per 24 hour  Intake   1335 ml  Output    925 ml  Net    410 ml    CBG (last 3)   Basename 02/04/12 0846 02/04/12 0522 02/04/12 0420  GLUCAP 139* 86 64*    Weight Status:  90.4 kg (3/22) -- trending up  Re-estimated needs:  2100-2300 kcals, 115-130 gm protein  Nutrition Dx:  Inadequate Oral Intake, ongoing  Goal:  TPN to meet >90% of estimated protein, maximize energy provision as able during lipid backorder, met Monitor: Diet advancement, labs, weight, I/O's  Intervention:    TPN per pharmacy  Add Resource Breeze supplement once daily to maximize PO intake (250 kcals, 9 gm protein per carton)  RD to follow for nutrition care plan   Jared [name] Pager #:  [phone]

## 2012-02-04 NOTE — Progress Notes (Signed)
10 Days Post-Op  Subjective: +BM.  No nausea.  Tolerated sips.  Low grade temp  Objective: Vital signs in last 24 hours: Temp:  [99.2 F (37.3 C)-100.3 F (37.9 C)] 99.4 F (37.4 C) (03/22 0640) Pulse Rate:  [102-106] 102  (03/22 0616) Resp:  [16-22] 20  (03/22 0616) BP: (124-145)/(67-69) 131/69 mmHg (03/22 0616) SpO2:  [95 %-99 %] 99 % (03/22 0616) Weight:  [199 lb 4.7 oz (90.4 kg)] 199 lb 4.7 oz (90.4 kg) (03/22 0710) Last BM Date: 01/27/12  Intake/Output from previous day: 03/21 0701 - 03/22 0700 In: 1355 [P.O.:240; I.V.:975; IV Piggyback:100; TPN:40] Out: 1150 [Urine:1150] Intake/Output this shift:    Much more comfortable in appearance Abdomen softer, incision stable, good BS  Lab Results:   [facility] 02/03/12 0525 02/02/12 0535  WBC 9.2 12.4*  HGB 12.1* 13.5  HCT 36.4* 39.7  PLT 275 295   BMET  Basename 02/04/12 0500 02/03/12 0525  NA 135 137  K 4.2 3.9  CL 100 98  CO2 28 31  GLUCOSE 98 147*  BUN 13 14  CREATININE 0.94 0.91  CALCIUM 8.3* 8.3*   PT/INR No results found for this basename: LABPROT:2,INR:2 in the last 72 hours ABG No results found for this basename: PHART:2,PCO2:2,PO2:2,HCO3:2 in the last 72 hours  Studies/Results: No results found.  Anti-infectives: Anti-infectives     Start     Dose/Rate Route Frequency Ordered Stop   02/02/12 1400   piperacillin-tazobactam (ZOSYN) IVPB 3.375 g        3.375 g 12.5 mL/hr over 240 Minutes Intravenous 3 times per day 02/02/12 0532     02/02/12 1200   vancomycin (VANCOCIN) IVPB 1000 mg/200 mL premix  Status:  Discontinued        1,000 mg 200 mL/hr over 60 Minutes Intravenous Every 8 hours 02/02/12 1059 02/03/12 1110   01/31/12 1100   piperacillin-tazobactam (ZOSYN) IVPB 3.375 g  Status:  Discontinued        3.375 g 12.5 mL/hr over 240 Minutes Intravenous Every 8 hours 01/31/12 0957 02/02/12 0532   01/24/12 1722   ertapenem (INVANZ) 1 g in sodium chloride 0.9 % 50 mL IVPB        1 g 100 mL/hr  over 30 Minutes Intravenous 60 min pre-op 01/24/12 1722 01/25/12 0948          Assessment/Plan: s/p Procedure(s) (LRB): EXPLORATORY LAPAROTOMY (N/A) SMALL BOWEL RESECTION (N/A)  Slowly resolving ileus.  Will start clear liquids.  If WBC starts going up or fever develops, will get repeat CT to see if abscess has developed  LOS: 15 days    Jared [name] A 02/04/2012

## 2012-02-04 NOTE — Progress Notes (Addendum)
PARENTERAL NUTRITION CONSULT NOTE - FOLLOW UP  Pharmacy Consult for TPN Indication: post-op ileus s/p SBR  No Known Allergies  Patient Measurements: Height: 6' 2"  (188 cm) Weight: 199 lb 4.7 oz (90.4 kg) IBW/kg (Calculated) : 82.2   Vital Signs: Temp: 99.4 F (37.4 C) (03/22 0640) Temp src: Oral (03/21 2130) BP: 131/69 mmHg (03/22 0616) Pulse Rate: 102  (03/22 0616) Intake/Output from previous day: 03/21 0701 - 03/22 0700 In: 1355 [P.O.:240; I.V.:975; IV Piggyback:100; TPN:40] Out: 1150 [Urine:1150] Intake/Output from this shift:    Labs:  [facility] 02/03/12 0525 02/02/12 0535  WBC 9.2 12.4*  HGB 12.1* 13.5  HCT 36.4* 39.7  PLT 275 295  APTT -- --  INR -- --     Basename 02/04/12 0500 02/03/12 0525 02/02/12 0535  NA 135 137 137  K 4.2 3.9 3.3*  CL 100 98 95*  CO2 28 31 31   GLUCOSE 98 147* 109*  BUN 13 14 14   CREATININE 0.94 0.91 0.91  LABCREA -- -- --  CREAT24HRUR -- -- --  CALCIUM 8.3* 8.3* 8.9  MG 2.4 2.4 2.6*  PHOS -- 4.2 --  PROT -- 6.1 --  ALBUMIN -- 2.1* --  AST -- 20 --  ALT -- 17 --  ALKPHOS -- 106 --  BILITOT -- 1.1 --  BILIDIR -- -- --  IBILI -- -- --  PREALBUMIN -- -- --  TRIG -- -- --  CHOLHDL -- -- --  CHOL -- -- --   Estimated Creatinine Clearance: 115.4 ml/min (by C-G formula based on Cr of 0.94).   Medications:  Scheduled:     . dextrose      . enoxaparin  40 mg Subcutaneous Q24H  . HYDROmorphone PCA 0.3 mg/mL   Intravenous Q4H  . HYDROmorphone PCA 0.3 mg/mL      . insulin aspart  0-20 Units Subcutaneous Q4H  . insulin glargine  10 Units Subcutaneous Daily  . lidocaine  20 mL Mouth/Throat Q6H  . magic mouthwash  5 mL Oral Q6H  . piperacillin-tazobactam (ZOSYN)  IV  3.375 g Intravenous Q8H  . DISCONTD: vancomycin  1,000 mg Intravenous Q8H    Basename 02/03/12 0818 02/03/12 0410 02/03/12 0011  GLUCAP 76 72 83     Insulin Requirements in the past 24 hours:  No SSI required; 155 units insulin in TNA, 10 units lantus  qday  Nutritional Goals:  2100-2300 kcal, 115-130 gm protein daily  Current Nutrition: Clinimix + lipids = 2269 kcal + 126 gm protein Clinimix without lipids = 1789 kcal + 126 gm protein Daily average per week = 1995 kcal + 126 gm protein (meeting 95% of kcal and 100% of protein needs)  Assessment: 30 YOM with post-op ileus s/p SBR (POD#10) to continue on TPN.  Noted CT showing development of abscess.  Passed some flatus overnight; ileus resolving slowing. - Lytes: all WNL - Endo: some hypoglycemia requiring D50W, currently with 155 units insulin in TPN + SSI + Lantus 10 units SQ daily - Renal:  SCr stable, good UOP - LFTs / tbili / TG / TC WNL   Plan:  - Continue Clinimix E 5/15 goal rate of 105 ml/hr - Lipids 10 ml/hr on MWF d/t national shortage - Multivitamins and trace elements on MWF - D/C Lantus - Back off slightly on insulin in TNA: 155 ==> 140 units - F/U treatment plans, CBGs   Johnnette Gourd, PharmD 02/04/2012 7:24 AM

## 2012-02-05 ENCOUNTER — Inpatient Hospital Stay (HOSPITAL_COMMUNITY): Payer: Self-pay

## 2012-02-05 LAB — BASIC METABOLIC PANEL
BUN: 11 mg/dL (ref 6–23)
CO2: 28 mEq/L (ref 19–32)
Calcium: 8.7 mg/dL (ref 8.4–10.5)
Chloride: 103 mEq/L (ref 96–112)
Creatinine, Ser: 0.93 mg/dL (ref 0.50–1.35)
GFR calc Af Amer: >90 mL/min (ref >90)
GFR calc non Af Amer: >90 mL/min (ref >90)
Glucose, Bld: 87 mg/dL (ref 70–99)
Potassium: 4.9 mEq/L (ref 3.5–5.1)
Sodium: 137 mEq/L (ref 135–145)

## 2012-02-05 LAB — CBC
HCT: 36.1 % — ABNORMAL LOW (ref 39.0–52.0)
Hemoglobin: 11.8 g/dL — ABNORMAL LOW (ref 13.0–17.0)
MCH: 28.4 pg (ref 26.0–34.0)
MCHC: 32.7 g/dL (ref 30.0–36.0)
MCV: 86.8 fL (ref 78.0–100.0)
Platelets: 327 10*3/uL (ref 150–400)
RBC: 4.16 MIL/uL — ABNORMAL LOW (ref 4.22–5.81)
RDW: 14.1 % (ref 11.5–15.5)
WBC: 8.5 10*3/uL (ref 4.0–10.5)

## 2012-02-05 LAB — GLUCOSE, CAPILLARY
Glucose-Capillary: 89 mg/dL (ref 70–99)
Glucose-Capillary: 105 mg/dL — ABNORMAL HIGH (ref 70–99)
Glucose-Capillary: 86 mg/dL (ref 70–99)
Glucose-Capillary: 129 mg/dL — ABNORMAL HIGH (ref 70–99)
Glucose-Capillary: 227 mg/dL — ABNORMAL HIGH (ref 70–99)

## 2012-02-05 LAB — MAGNESIUM: Magnesium: 2.4 mg/dL (ref 1.5–2.5)

## 2012-02-05 MED ORDER — HYDROMORPHONE 0.3 MG/ML IV SOLN
INTRAVENOUS | Status: AC
  Administered 2012-02-05: 18:00:00
  Filled 2012-02-05: qty 25

## 2012-02-05 MED ORDER — INSULIN REGULAR HUMAN 100 UNIT/ML IJ SOLN
INTRAVENOUS | Status: DC
  Administered 2012-02-05: 17:00:00 via INTRAVENOUS
  Filled 2012-02-05: qty 2520

## 2012-02-05 MED ORDER — HYDROXYZINE HCL 10 MG PO TABS
10.0000 mg | ORAL_TABLET | Freq: Every evening | ORAL | Status: DC | PRN
  Filled 2012-02-05: qty 1

## 2012-02-05 MED ORDER — HYDROMORPHONE 0.3 MG/ML IV SOLN
INTRAVENOUS | Status: AC
  Filled 2012-02-05: qty 25

## 2012-02-05 NOTE — Progress Notes (Signed)
11 Days Post-Op  Subjective: Feels better. Tolerating clear liquids, no nausea; had two BM's  Objective: Vital signs in last 24 hours: Temp:  [98.8 F (37.1 C)-100.6 F (38.1 C)] 98.9 F (37.2 C) (03/23 0442) Pulse Rate:  [77-100] 88  (03/23 0442) Resp:  [15-22] 15  (03/23 0800) BP: (123-127)/(61-64) 123/61 mmHg (03/23 0442) SpO2:  [96 %-100 %] 96 % (03/23 0800)   Intake/Output from previous day: 03/22 0701 - 03/23 0700 In: 1593.8 [I.V.:1593.8] Out: 1751 [Urine:1750; Stool:1] Intake/Output this shift:     General appearance: alert, cooperative and no distress Resp: clear to auscultation bilaterally and seems slightly dyspneic at times GI: Slightly distended soft, not tender, BS +  Incision: dressing dry - just changed by RN so wound not inspected  Lab Results:   Basename 02/05/12 0500 02/04/12 1145  WBC 8.5 9.5  HGB 11.8* 12.4*  HCT 36.1* 38.1*  PLT 327 295   BMET  Basename 02/05/12 0500 02/04/12 0500  NA 137 135  K 4.9 4.2  CL 103 100  CO2 28 28  GLUCOSE 87 98  BUN 11 13  CREATININE 0.93 0.94  CALCIUM 8.7 8.3*   PT/INR No results found for this basename: LABPROT:2,INR:2 in the last 72 hours ABG No results found for this basename: PHART:2,PCO2:2,PO2:2,HCO3:2 in the last 72 hours  MEDS, Scheduled    . enoxaparin  40 mg Subcutaneous Q24H  . feeding supplement  1 Container Oral Daily  . HYDROmorphone PCA 0.3 mg/mL   Intravenous Q4H  . HYDROmorphone PCA 0.3 mg/mL      . HYDROmorphone PCA 0.3 mg/mL      . insulin aspart  0-20 Units Subcutaneous Q4H  . lidocaine  20 mL Mouth/Throat Q6H  . magic mouthwash  5 mL Oral Q6H  . piperacillin-tazobactam (ZOSYN)  IV  3.375 g Intravenous Q8H    Studies/Results: No results found.  Assessment: s/p Procedure(s): EXPLORATORY LAPAROTOMY SMALL BOWEL RESECTION Seems to be progressing, but slight dyspnea at times  Plan: Advance diet Will check CXR.    LOS: 16 days     Haywood Lasso, MD,  [facility], [place] [phone]   02/05/2012 11:52 AM

## 2012-02-05 NOTE — Progress Notes (Signed)
ANTIBIOTIC CONSULT NOTE   Pharmacy Consult for Zosyn Indication: r/o intraabdominal infection/abscess  No Known Allergies  Patient Measurements: Height: 6' 2"  (188 cm) Weight: 199 lb 4.7 oz (90.4 kg) IBW/kg (Calculated) : 82.2    Vital Signs: Temp: 98.9 F (37.2 C) (03/23 0442) BP: 123/61 mmHg (03/23 0442) Pulse Rate: 88  (03/23 0442) Intake/Output from previous day: 03/22 0701 - 03/23 0700 In: 1593.8 [I.V.:1593.8] Out: 1751 [Urine:1750; Stool:1] Intake/Output from this shift:    Labs:  Basename 02/05/12 0500 02/04/12 1145 02/04/12 0500 02/03/12 0525  WBC 8.5 9.5 -- 9.2  HGB 11.8* 12.4* -- 12.1*  PLT 327 295 -- 275  LABCREA -- -- -- --  CREATININE 0.93 -- 0.94 0.91   Estimated Creatinine Clearance: 116.6 ml/min (by C-G formula based on Cr of 0.93). No results found for this basename: VANCOTROUGH:2,VANCOPEAK:2,VANCORANDOM:2,GENTTROUGH:2,GENTPEAK:2,GENTRANDOM:2,TOBRATROUGH:2,TOBRAPEAK:2,TOBRARND:2,AMIKACINPEAK:2,AMIKACINTROU:2,AMIKACIN:2, in the last 72 hours   Microbiology: Recent Results (from the past 720 hour(s))  CLOSTRIDIUM DIFFICILE BY PCR     Status: Normal   Collection Time   01/22/12  7:38 PM      Component Value Range Status Comment   C difficile by pcr NEGATIVE  NEGATIVE  Final   SURGICAL PCR SCREEN     Status: Normal   Collection Time   01/24/12  8:45 PM      Component Value Range Status Comment   MRSA, PCR NEGATIVE  NEGATIVE  Final    Staphylococcus aureus NEGATIVE  NEGATIVE  Final   CULTURE, BLOOD (ROUTINE X 2)     Status: Normal (Preliminary result)   Collection Time   01/31/12 10:51 AM      Component Value Range Status Comment   Specimen Description BLOOD LEFT ARM   Final    Special Requests     Final    Value: BOTTLES DRAWN AEROBIC AND ANAEROBIC AERO 10CC ANA 5CC   Culture  Setup Time [id number]   Final    Culture     Final    Value:        BLOOD CULTURE RECEIVED NO GROWTH TO DATE CULTURE WILL BE HELD FOR 5 DAYS BEFORE ISSUING A FINAL NEGATIVE  REPORT   Report Status PENDING   Incomplete   CULTURE, BLOOD (ROUTINE X 2)     Status: Normal   Collection Time   01/31/12 10:56 AM      Component Value Range Status Comment   Specimen Description BLOOD LEFT HAND   Final    Special Requests BOTTLES DRAWN AEROBIC ONLY 5CC   Final    Culture  Setup Time [id number]   Final    Culture     Final    Value: STAPHYLOCOCCUS SPECIES (COAGULASE NEGATIVE)     Note: THE SIGNIFICANCE OF ISOLATING THIS ORGANISM FROM A SINGLE SET OF BLOOD CULTURES WHEN MULTIPLE SETS ARE DRAWN IS UNCERTAIN. PLEASE NOTIFY THE MICROBIOLOGY DEPARTMENT WITHIN ONE WEEK IF SPECIATION AND SENSITIVITIES ARE REQUIRED.     Note: Gram Stain Report Called to,Read Back By and Verified With: [name] @0135  ON 02/02/2012 BY MCLET   Report Status 02/03/2012 FINAL   Final   URINE CULTURE     Status: Normal   Collection Time   01/31/12 11:16 AM      Component Value Range Status Comment   Specimen Description URINE, CLEAN CATCH   Final    Special Requests ADDED 02/01/12 1539   Final    Culture  Setup Time [id number]   Final    Colony Count NO GROWTH  Final    Culture NO GROWTH   Final    Report Status 02/02/2012 FINAL   Final   URINE CULTURE     Status: Normal   Collection Time   02/02/12  3:25 AM      Component Value Range Status Comment   Specimen Description URINE, CLEAN CATCH   Final    Special Requests NONE   Final    Culture  Setup Time [id number]   Final    Colony Count NO GROWTH   Final    Culture NO GROWTH   Final    Report Status 02/03/2012 FINAL   Final     Medical History: Past Medical History  Diagnosis Date  . Crohn disease 2008    with hospital admission in 2011, and 8/12 for flare up and SBO relieved with bowel rest. no suregery, no meds for CD  . Diabetes mellitus type II     Medications:  Prescriptions prior to admission  Medication Sig Dispense Refill  . glyBURIDE (DIABETA) 5 MG tablet Take 5 mg by mouth daily with breakfast.      . metFORMIN  (GLUCOPHAGE) 500 MG tablet Take 500 mg by mouth 2 (two) times daily with a meal.       Assessment: 46 yo man with h/o Chron's disease s/p small bowel resection on empiric zosyn r/o intraabdominal infection.  CrCl >100 ml/min.  Goal of Therapy:  Appropriate antibiotic therapy  Plan:  Cont zosyn 3.375 gm IV q8 hours.   Excell Seltzer Poteet 02/05/2012,1:36 PM

## 2012-02-05 NOTE — Progress Notes (Signed)
PARENTERAL NUTRITION CONSULT NOTE - FOLLOW UP  Pharmacy Consult for TPN Indication: post-op ileus s/p SBR  No Known Allergies  Patient Measurements: Height: 6' 2"  (188 cm) Weight: 199 lb 4.7 oz (90.4 kg) IBW/kg (Calculated) : 82.2   Vital Signs: Temp: 98.9 F (37.2 C) (03/23 0442) BP: 123/61 mmHg (03/23 0442) Pulse Rate: 88  (03/23 0442) Intake/Output from previous day: 03/22 0701 - 03/23 0700 In: 1593.8 [I.V.:1593.8] Out: 1751 [Urine:1750; Stool:1] Intake/Output from this shift:    Labs:  Basename 02/05/12 0500 02/04/12 1145 02/03/12 0525  WBC 8.5 9.5 9.2  HGB 11.8* 12.4* 12.1*  HCT 36.1* 38.1* 36.4*  PLT 327 295 275  APTT -- -- --  INR -- -- --     Basename 02/05/12 0500 02/04/12 0500 02/03/12 0525  NA 137 135 137  K 4.9 4.2 3.9  CL 103 100 98  CO2 28 28 31   GLUCOSE 87 98 147*  BUN 11 13 14   CREATININE 0.93 0.94 0.91  LABCREA -- -- --  CREAT24HRUR -- -- --  CALCIUM 8.7 8.3* 8.3*  MG 2.4 2.4 2.4  PHOS -- -- 4.2  PROT -- -- 6.1  ALBUMIN -- -- 2.1*  AST -- -- 20  ALT -- -- 17  ALKPHOS -- -- 106  BILITOT -- -- 1.1  BILIDIR -- -- --  IBILI -- -- --  PREALBUMIN -- -- --  TRIG -- -- --  CHOLHDL -- -- --  CHOL -- -- --   Estimated Creatinine Clearance: 116.6 ml/min (by C-G formula based on Cr of 0.93).   Medications:  Scheduled:     . enoxaparin  40 mg Subcutaneous Q24H  . feeding supplement  1 Container Oral Daily  . HYDROmorphone PCA 0.3 mg/mL   Intravenous Q4H  . HYDROmorphone PCA 0.3 mg/mL      . HYDROmorphone PCA 0.3 mg/mL      . insulin aspart  0-20 Units Subcutaneous Q4H  . lidocaine  20 mL Mouth/Throat Q6H  . magic mouthwash  5 mL Oral Q6H  . piperacillin-tazobactam (ZOSYN)  IV  3.375 g Intravenous Q8H    CBG (last 3)   Basename 02/05/12 0813 02/05/12 0356 02/04/12 2359  GLUCAP 105* 89 84    Insulin Requirements in the past 24 hours:  3 SSI required; 140 units insulin in TNA, lantus d/c'd 3/22  Nutritional Goals:  2100-2300  kcal, 115-130 gm protein daily  Current Nutrition: Clinimix + lipids = 2269 kcal + 126 gm protein Clinimix without lipids = 1789 kcal + 126 gm protein Daily average per week = 1995 kcal + 126 gm protein (meeting 95% of kcal and 100% of protein needs)  Assessment: 66 YOM with post-op ileus s/p SBR (POD#11) to continue on TPN.  Noted CT showing development of abscess.  Passed some flatus overnight; ileus resolving slowing.  Started on clears 3/22.  No PO intake noted. - Lytes: all WNL, k increasing - follow - Endo: CBG with improved control after decreasing insulin in TNA overnight - Renal:  SCr stable, good UOP - LFTs / tbili / TG / TC WNL  Plan:  - Continue Clinimix E 5/15 goal rate of 105 ml/hr - Lipids 10 ml/hr on MWF d/t national shortage - Multivitamins and trace elements on MWF - F/U treatment plans, CBGs, am BMP  Jolly Carlini L. Amada Jupiter, PharmD, [place] Clinical Pharmacist Pager: [phone] 02/05/2012 9:23 AM

## 2012-02-05 NOTE — Progress Notes (Signed)
Subjective: +BM yesterdayt and passed a lot of flatulence. Tolerated well with CL. No Nausea or vomiting, His abd pain is well controlled by PCA.  Nursing staff reported low grade temp 100.6 last night.  Objective: Vital signs in last 24 hours: Filed Vitals:   02/05/12 0400 02/05/12 0442 02/05/12 0800 02/05/12 1152  BP:  123/61    Pulse:  88    Temp:  98.9 F (37.2 C)    TempSrc:      Resp: 16 18 15 14   Height:      Weight:      SpO2: 98% 100% 96% 97%   Weight change:   Intake/Output Summary (Last 24 hours) at 02/05/12 1328 Last data filed at 02/05/12 0600  Gross per 24 hour  Intake 1593.75 ml  Output   1750 ml  Net -156.25 ml   Physical Exam: General: Acute ill appearing  Nose: NG tube with suction, dark brownish output noted.  Lungs: Bibasilar lung sounds diminished. No wheezing, rales or rhonchi noted.  Heart: RRR, Sinus tachycardia HR 110's, no M/G/R  Abdomen: soft, tenderness noted at LLQ with. Mild to moderate distension noted. No BS  Ext: No edema. SCDs on  Lab Results: Basic Metabolic Panel:  Lab 82/42/35 0500 02/04/12 0500 02/03/12 0525 01/31/12 0635  [name] 137 135 -- --  K 4.9 4.2 -- --  CL 103 100 -- --  CO2 28 28 -- --  GLUCOSE 87 98 -- --  BUN 11 13 -- --  CREATININE 0.93 0.94 -- --  CALCIUM 8.7 8.3* -- --  MG 2.4 2.4 -- --  PHOS -- -- 4.2 3.7   Liver Function Tests:  Lab 02/03/12 0525 01/31/12 0635  AST 20 18  ALT 17 16  ALKPHOS 106 93  BILITOT 1.1 1.7*  PROT 6.1 6.5  ALBUMIN 2.1* 2.0*   CBC:  Lab 02/05/12 0500 02/04/12 1145 01/31/12 0635  WBC 8.5 9.5 --  NEUTROABS -- -- 20.8*  HGB 11.8* 12.4* --  HCT 36.1* 38.1* --  MCV 86.8 87.4 --  PLT 327 295 --   CBG:  Lab 02/05/12 1138 02/05/12 0813 02/05/12 0356 02/04/12 2359 02/04/12 2036 02/04/12 1637  GLUCAP 86 105* 89 84 145* 283*   Fasting Lipid Panel:  Lab 01/31/12 0635  CHOL 148  HDL --  LDLCALC --  TRIG 146  CHOLHDL --  LDLDIRECT --   Urinalysis:  Lab 01/31/12 1116    COLORURINE ORANGE*  LABSPEC 1.035*  PHURINE 6.0  GLUCOSEU >1000*  HGBUR NEGATIVE  BILIRUBINUR MODERATE*  KETONESUR >80*  PROTEINUR 30*  UROBILINOGEN 2.0*  NITRITE POSITIVE*  LEUKOCYTESUR SMALL*   Micro Results: Recent Results (from the past 240 hour(s))  CULTURE, BLOOD (ROUTINE X 2)     Status: Normal (Preliminary result)   Collection Time   01/31/12 10:51 AM      Component Value Range Status Comment   Specimen Description BLOOD LEFT ARM   Final    Special Requests     Final    Value: BOTTLES DRAWN AEROBIC AND ANAEROBIC AERO 10CC ANA 5CC   Culture  Setup Time [id number]   Final    Culture     Final    Value:        BLOOD CULTURE RECEIVED NO GROWTH TO DATE CULTURE WILL BE HELD FOR 5 DAYS BEFORE ISSUING A FINAL NEGATIVE REPORT   Report Status PENDING   Incomplete   CULTURE, BLOOD (ROUTINE X 2)     Status: Normal  Collection Time   01/31/12 10:56 AM      Component Value Range Status Comment   Specimen Description BLOOD LEFT HAND   Final    Special Requests BOTTLES DRAWN AEROBIC ONLY 5CC   Final    Culture  Setup Time [id number]   Final    Culture     Final    Value: STAPHYLOCOCCUS SPECIES (COAGULASE NEGATIVE)     Note: THE SIGNIFICANCE OF ISOLATING THIS ORGANISM FROM A SINGLE SET OF BLOOD CULTURES WHEN MULTIPLE SETS ARE DRAWN IS UNCERTAIN. PLEASE NOTIFY THE MICROBIOLOGY DEPARTMENT WITHIN ONE WEEK IF SPECIATION AND SENSITIVITIES ARE REQUIRED.     Note: Gram Stain Report Called to,Read Back By and Verified With: [name] @0135  ON 02/02/2012 BY MCLET   Report Status 02/03/2012 FINAL   Final   URINE CULTURE     Status: Normal   Collection Time   01/31/12 11:16 AM      Component Value Range Status Comment   Specimen Description URINE, CLEAN CATCH   Final    Special Requests ADDED 02/01/12 1539   Final    Culture  Setup Time [id number]   Final    Colony Count NO GROWTH   Final    Culture NO GROWTH   Final    Report Status 02/02/2012 FINAL   Final   URINE CULTURE      Status: Normal   Collection Time   02/02/12  3:25 AM      Component Value Range Status Comment   Specimen Description URINE, CLEAN CATCH   Final    Special Requests NONE   Final    Culture  Setup Time [id number]   Final    Colony Count NO GROWTH   Final    Culture NO GROWTH   Final    Report Status 02/03/2012 FINAL   Final    Studies/Results: No results found. Medications: I have reviewed the patient's current medications. Scheduled Meds:   . enoxaparin  40 mg Subcutaneous Q24H  . feeding supplement  1 Container Oral Daily  . HYDROmorphone PCA 0.3 mg/mL   Intravenous Q4H  . HYDROmorphone PCA 0.3 mg/mL      . HYDROmorphone PCA 0.3 mg/mL      . insulin aspart  0-20 Units Subcutaneous Q4H  . lidocaine  20 mL Mouth/Throat Q6H  . magic mouthwash  5 mL Oral Q6H  . piperacillin-tazobactam (ZOSYN)  IV  3.375 g Intravenous Q8H   Continuous Infusions:   . dextrose 500 mL (02/04/12 1358)  . fat emulsion 250 mL (02/04/12 1809)  . sodium chloride 0.9 % 1,000 mL with potassium chloride 30 mEq infusion 75 mL/hr at 02/04/12 1800  . TPN (CLINIMIX) +/- additives    . TPN (CLINIMIX) +/- additives 105 mL/hr at 02/04/12 1800  . DISCONTD: sodium chloride 0.9 % 1,000 mL with potassium chloride 30 mEq infusion 125 mL/hr at 02/03/12 0927   PRN Meds:.diphenhydrAMINE, diphenhydrAMINE, hydrALAZINE, hydrOXYzine, menthol-cetylpyridinium, naloxone, ondansetron (ZOFRAN) IV, ondansetron, phenol, sodium chloride, sodium chloride Assessment/Plan:  # Sepsis, resolving  Clinically improving with less N/V. NG removed  WBC trending down 24.7>>17.5>>12.4>>9.2>9.5>8.5.  Day # 6>>Zosyn  Day # 2>> Vanc>>D/C'd on 3/21  his Blood culture 1/2 showed GPC in clusters>>>coagulase negative>>>Vanco D/C'd  Plan  - IVF hydration  - Broaden spectrum ABX Zosyn  - surgical service to follow >>> conservative treatment . No surgery for now.  # Abd abscess s/p ileocecectomy 3/12  POD day #11  - tolerated well after NG  removed, +BM last night  -on CL  - Surgery to follow  # Pyuria  Foley D/C'd POD#1, Patient c/o urinary burning sensation and UA showed positive Nitrates, small leukocytes, WBC 02/HPF  - Ucx shows no growth  # Hypokalemia, hypomagnesemia  High risk for above problems due to n.p.o. status, NG tube suctioning, and TPN infusion.  -Will monitor closely and replace accordingly  # DM, type II  -Oral anti-hyperglycemics medications are on hold since admission  -TPN started  -Monitor CBGs>>>experience low CBG last night at 60's. Patient asymptomatic.  -D/C Lantus  # Crohn disease  Patient has had 5 years of Crohn's disease and presented with flareup on this admission. Status post ileocecectomy on 3/12.  -Will discuss with GI Dr. Paulita Fujita for outpatient medical management once his acute issue resolved        LOS: 16 days   Jared [name] 02/05/2012, 1:28 PM

## 2012-02-06 LAB — GLUCOSE, CAPILLARY
Glucose-Capillary: 165 mg/dL — ABNORMAL HIGH (ref 70–99)
Glucose-Capillary: 167 mg/dL — ABNORMAL HIGH (ref 70–99)
Glucose-Capillary: 175 mg/dL — ABNORMAL HIGH (ref 70–99)
Glucose-Capillary: 182 mg/dL — ABNORMAL HIGH (ref 70–99)
Glucose-Capillary: 208 mg/dL — ABNORMAL HIGH (ref 70–99)
Glucose-Capillary: 221 mg/dL — ABNORMAL HIGH (ref 70–99)
Glucose-Capillary: 223 mg/dL — ABNORMAL HIGH (ref 70–99)

## 2012-02-06 LAB — BASIC METABOLIC PANEL
BUN: 11 mg/dL (ref 6–23)
CO2: 28 mEq/L (ref 19–32)
Calcium: 8.9 mg/dL (ref 8.4–10.5)
Chloride: 100 mEq/L (ref 96–112)
Creatinine, Ser: 0.89 mg/dL (ref 0.50–1.35)
GFR calc Af Amer: >90 mL/min (ref >90)
GFR calc non Af Amer: >90 mL/min (ref >90)
Glucose, Bld: 170 mg/dL — ABNORMAL HIGH (ref 70–99)
Potassium: 4.7 mEq/L (ref 3.5–5.1)
Sodium: 134 mEq/L — ABNORMAL LOW (ref 135–145)

## 2012-02-06 LAB — CULTURE, BLOOD (ROUTINE X 2)
Culture  Setup Time: 201303181421
Culture: NO GROWTH

## 2012-02-06 LAB — MAGNESIUM: Magnesium: 2.2 mg/dL (ref 1.5–2.5)

## 2012-02-06 LAB — CBC
HCT: 36.1 % — ABNORMAL LOW (ref 39.0–52.0)
Hemoglobin: 11.7 g/dL — ABNORMAL LOW (ref 13.0–17.0)
MCH: 28.4 pg (ref 26.0–34.0)
MCHC: 32.4 g/dL (ref 30.0–36.0)
MCV: 87.6 fL (ref 78.0–100.0)
Platelets: 353 10*3/uL (ref 150–400)
RBC: 4.12 MIL/uL — ABNORMAL LOW (ref 4.22–5.81)
RDW: 13.8 % (ref 11.5–15.5)
WBC: 7.1 10*3/uL (ref 4.0–10.5)

## 2012-02-06 MED ORDER — INSULIN REGULAR HUMAN 100 UNIT/ML IJ SOLN
INTRAMUSCULAR | Status: DC
  Administered 2012-02-06: 18:00:00 via INTRAVENOUS
  Filled 2012-02-06: qty 2000

## 2012-02-06 MED ORDER — INSULIN REGULAR HUMAN 100 UNIT/ML IJ SOLN
INTRAMUSCULAR | Status: AC
  Filled 2012-02-06: qty 2520

## 2012-02-06 MED ORDER — INSULIN REGULAR HUMAN 100 UNIT/ML IJ SOLN
INTRAMUSCULAR | Status: DC
  Filled 2012-02-06: qty 2520

## 2012-02-06 MED ORDER — HYDROMORPHONE 0.3 MG/ML IV SOLN
INTRAVENOUS | Status: AC
  Administered 2012-02-06: 08:00:00
  Filled 2012-02-06: qty 25

## 2012-02-06 MED ORDER — INSULIN REGULAR HUMAN 100 UNIT/ML IJ SOLN
INTRAVENOUS | Status: DC
  Filled 2012-02-06: qty 2520

## 2012-02-06 NOTE — Progress Notes (Signed)
Subjective: Afebrile overnight,  passed a lot of flatulence. Tolerated well with CL. No Nausea or vomiting, His abd pain is well controlled by PCA.  Ambulate with assistance  Objective: Vital signs in last 24 hours: Filed Vitals:   02/06/12 0353 02/06/12 0537 02/06/12 0800 02/06/12 1100  BP:  130/68    Pulse:  99    Temp:  99.5 F (37.5 C)    TempSrc:  Oral    Resp: 16 18 16    Height:      Weight:    180 lb 5.4 oz (81.8 kg)  SpO2: 98% 99% 99%    Weight change:   Intake/Output Summary (Last 24 hours) at 02/06/12 1143 Last data filed at 02/06/12 1131  Gross per 24 hour  Intake   1560 ml  Output   2700 ml  Net  -1140 ml   Physical Exam: General:NAD Lungs: Bibasilar lung sounds diminished. No wheezing, rales or rhonchi noted.  Heart: RRR, , no M/G/R  Abdomen: soft, tenderness noted at LLQ with. Mild distension noted. Hypoactive BS  Ext: No edema. SCDs on  Lab Results: Basic Metabolic Panel:  Lab 63/81/77 0530 02/05/12 0500 02/03/12 0525 01/31/12 0635  Chereese [place] 134* 137 -- --  K 4.7 4.9 -- --  CL 100 103 -- --  CO2 28 28 -- --  GLUCOSE 170* 87 -- --  BUN 11 11 -- --  CREATININE 0.89 0.93 -- --  CALCIUM 8.9 8.7 -- --  MG 2.2 2.4 -- --  PHOS -- -- 4.2 3.7   Liver Function Tests:  Lab 02/03/12 0525 01/31/12 0635  AST 20 18  ALT 17 16  ALKPHOS 106 93  BILITOT 1.1 1.7*  PROT 6.1 6.5  ALBUMIN 2.1* 2.0*   CBC:  Lab 02/06/12 0530 02/05/12 0500 01/31/12 0635  WBC 7.1 8.5 --  NEUTROABS -- -- 20.8*  HGB 11.7* 11.8* --  HCT 36.1* 36.1* --  MCV 87.6 86.8 --  PLT 353 327 --   CBG:  Lab 02/06/12 1107 02/06/12 0809 02/06/12 0709 02/06/12 0416 02/06/12 0014 02/05/12 2007  GLUCAP 175* 221* 165* 223* 182* 227*   Fasting Lipid Panel:  Lab 01/31/12 0635  CHOL 148  HDL --  LDLCALC --  TRIG 146  CHOLHDL --  LDLDIRECT --   Urinalysis:  Lab 01/31/12 1116  COLORURINE ORANGE*  LABSPEC 1.035*  PHURINE 6.0  GLUCOSEU >1000*  HGBUR NEGATIVE  BILIRUBINUR MODERATE*    KETONESUR >80*  PROTEINUR 30*  UROBILINOGEN 2.0*  NITRITE POSITIVE*  LEUKOCYTESUR SMALL*   Micro Results: Recent Results (from the past 240 hour(s))  CULTURE, BLOOD (ROUTINE X 2)     Status: Normal   Collection Time   01/31/12 10:51 AM      Component Value Range Status Comment   Specimen Description BLOOD LEFT ARM   Final    Special Requests     Final    Value: BOTTLES DRAWN AEROBIC AND ANAEROBIC AERO 10CC ANA 5CC   Culture  Setup Time [id number]   Final    Culture NO GROWTH 5 DAYS   Final    Report Status 02/06/2012 FINAL   Final   CULTURE, BLOOD (ROUTINE X 2)     Status: Normal   Collection Time   01/31/12 10:56 AM      Component Value Range Status Comment   Specimen Description BLOOD LEFT HAND   Final    Special Requests BOTTLES DRAWN AEROBIC ONLY 5CC   Final    Culture  Setup Time [id number]   Final    Culture     Final    Value: STAPHYLOCOCCUS SPECIES (COAGULASE NEGATIVE)     Note: THE SIGNIFICANCE OF ISOLATING THIS ORGANISM FROM A SINGLE SET OF BLOOD CULTURES WHEN MULTIPLE SETS ARE DRAWN IS UNCERTAIN. PLEASE NOTIFY THE MICROBIOLOGY DEPARTMENT WITHIN ONE WEEK IF SPECIATION AND SENSITIVITIES ARE REQUIRED.     Note: Gram Stain Report Called to,Read Back By and Verified With: [name] @0135  ON 02/02/2012 BY MCLET   Report Status 02/03/2012 FINAL   Final   URINE CULTURE     Status: Normal   Collection Time   01/31/12 11:16 AM      Component Value Range Status Comment   Specimen Description URINE, CLEAN CATCH   Final    Special Requests ADDED 02/01/12 1539   Final    Culture  Setup Time [id number]   Final    Colony Count NO GROWTH   Final    Culture NO GROWTH   Final    Report Status 02/02/2012 FINAL   Final   URINE CULTURE     Status: Normal   Collection Time   02/02/12  3:25 AM      Component Value Range Status Comment   Specimen Description URINE, CLEAN CATCH   Final    Special Requests NONE   Final    Culture  Setup Time [id number]   Final    Colony Count  NO GROWTH   Final    Culture NO GROWTH   Final    Report Status 02/03/2012 FINAL   Final    Studies/Results: Dg Chest 2 View  02/05/2012  *RADIOLOGY REPORT*  Clinical Data: Status post abdominal surgery.  Low grade fever.  CHEST - 2 VIEW  Comparison: None.  Findings: Right PICC is in place the tip in the lower superior vena cava.  Discoid opacities are present in the bases bilaterally most compatible with atelectasis.  No pneumothorax or effusion.  Lung volumes are low.  Heart size is normal.  IMPRESSION: Discoid bibasilar atelectasis.  Original Report Authenticated By: Arvid Right. Luther Parody, M.D.   Medications: I have reviewed the patient's current medications. Scheduled Meds:   . enoxaparin  40 mg Subcutaneous Q24H  . feeding supplement  1 Container Oral Daily  . HYDROmorphone PCA 0.3 mg/mL   Intravenous Q4H  . HYDROmorphone PCA 0.3 mg/mL      . HYDROmorphone PCA 0.3 mg/mL      . HYDROmorphone PCA 0.3 mg/mL      . insulin aspart  0-20 Units Subcutaneous Q4H  . lidocaine  20 mL Mouth/Throat Q6H  . magic mouthwash  5 mL Oral Q6H  . piperacillin-tazobactam (ZOSYN)  IV  3.375 g Intravenous Q8H   Continuous Infusions:   . fat emulsion 250 mL (02/04/12 1809)  . sodium chloride 0.9 % 1,000 mL with potassium chloride 30 mEq infusion 75 mL/hr at 02/06/12 0605  . TPN (CLINIMIX) +/- additives    . TPN (CLINIMIX) +/- additives    . TPN (CLINIMIX) +/- additives 105 mL/hr at 02/04/12 1800  . DISCONTD: TPN (CLINIMIX) +/- additives 105 mL/hr at 02/05/12 2100  . DISCONTD: TPN (CLINIMIX) +/- additives    . DISCONTD: TPN (CLINIMIX) +/- additives     PRN Meds:.diphenhydrAMINE, diphenhydrAMINE, hydrALAZINE, hydrOXYzine, menthol-cetylpyridinium, naloxone, ondansetron (ZOFRAN) IV, ondansetron, phenol, sodium chloride, sodium chloride Assessment/Plan:  # Sepsis, resolving  Clinically improving with less N/V. NG removed  WBC trending down Day # 7>>Zosyn  Day # 2>>  Vanc>>D/C'd on 3/21  his Blood  culture 1/2 showed GPC in clusters>>>coagulase negative>>>Vanco D/C'd   Plan  - IVF hydration  - Broaden spectrum ABX Zosyn  - surgical service to follow >>> conservative treatment . No surgery for now.   # Abd abscess s/p ileocecectomy 3/12  POD day #12   -on CL >>>tolerate well - Surgery to follow  # Pyuria  Foley D/C'd POD#1, Patient c/o urinary burning sensation and UA showed positive Nitrates, small leukocytes, WBC 02/HPF  - Ucx shows no growth  # Hypokalemia, hypomagnesemia  High risk for above problems due to n.p.o. status, NG tube suctioning, and TPN infusion.  -Will monitor closely and replace accordingly  # DM, type II  -Oral anti-hyperglycemics medications are on hold since admission  -TPN -Monitor CBGs>>>experience low CBG last night at 60's. Patient asymptomatic.   # Crohn disease  Patient has had 5 years of Crohn's disease and presented with flareup on this admission. Status post ileocecectomy on 3/12.  -Will discuss with GI Dr. Paulita Fujita for outpatient medical management once his acute issue resolved        LOS: 17 days   Jared [name] 02/06/2012, 11:43 AM

## 2012-02-06 NOTE — Progress Notes (Addendum)
PARENTERAL NUTRITION CONSULT NOTE - FOLLOW UP  Pharmacy Consult for TPN Indication: post-op ileus s/p SBR  No Known Allergies  Patient Measurements: Height: 6' 2"  (188 cm) Weight: 199 lb 4.7 oz (90.4 kg) IBW/kg (Calculated) : 82.2   Vital Signs: Temp: 99.5 F (37.5 C) (03/24 0537) Temp src: Oral (03/24 0537) BP: 130/68 mmHg (03/24 0537) Pulse Rate: 99  (03/24 0537) Intake/Output from previous day: 03/23 0701 - 03/24 0700 In: 1080 [P.O.:1080] Out: 2400 [Urine:2400] Intake/Output from this shift:    Labs:  Basename 02/06/12 0530 02/05/12 0500 02/04/12 1145  WBC 7.1 8.5 9.5  HGB 11.7* 11.8* 12.4*  HCT 36.1* 36.1* 38.1*  PLT 353 327 295  APTT -- -- --  INR -- -- --     Basename 02/06/12 0530 02/05/12 0500 02/04/12 0500  NA 134* 137 135  K 4.7 4.9 4.2  CL 100 103 100  CO2 28 28 28   GLUCOSE 170* 87 98  BUN 11 11 13   CREATININE 0.89 0.93 0.94  LABCREA -- -- --  CREAT24HRUR -- -- --  CALCIUM 8.9 8.7 8.3*  MG 2.2 2.4 2.4  PHOS -- -- --  PROT -- -- --  ALBUMIN -- -- --  AST -- -- --  ALT -- -- --  ALKPHOS -- -- --  BILITOT -- -- --  BILIDIR -- -- --  IBILI -- -- --  PREALBUMIN -- -- --  TRIG -- -- --  CHOLHDL -- -- --  CHOL -- -- --   Estimated Creatinine Clearance: 121.9 ml/min (by C-G formula based on Cr of 0.89).   Medications:  Scheduled:     . enoxaparin  40 mg Subcutaneous Q24H  . feeding supplement  1 Container Oral Daily  . HYDROmorphone PCA 0.3 mg/mL   Intravenous Q4H  . HYDROmorphone PCA 0.3 mg/mL      . HYDROmorphone PCA 0.3 mg/mL      . HYDROmorphone PCA 0.3 mg/mL      . insulin aspart  0-20 Units Subcutaneous Q4H  . lidocaine  20 mL Mouth/Throat Q6H  . magic mouthwash  5 mL Oral Q6H  . piperacillin-tazobactam (ZOSYN)  IV  3.375 g Intravenous Q8H    CBG (last 3)   Basename 02/06/12 0809 02/06/12 0709 02/06/12 0416  GLUCAP 221* 165* 223*   Insulin Requirements in the past 24 hours:  25 units SSI; 140 units insulin in TNA, lantus  d/c'd 3/22  Nutritional Goals:  2100-2300 kcal, 115-130 gm protein daily  Current Nutrition: Clinimix + lipids = 2269 kcal + 126 gm protein Clinimix without lipids = 1789 kcal + 126 gm protein Daily average per week = 1995 kcal + 126 gm protein (meeting 95% of kcal and 100% of protein needs)  Assessment: 57 YOM with post-op ileus s/p SBR (POD#12) to continue on TPN.  Noted CT showing development of abscess.  Ileus resolving slowing.  Started on clears 3/22 then advanced to clears 3/23.  A total of 1080 mL PO intake noted 3/23. - Lytes: all WNL. - Endo: CBG elevated requiring increased insulin - likely 2/2 increased po intake - Renal:  SCr stable, good UOP - LFTs / tbili / TG / TC WNL  Plan:  - Continue Clinimix E 5/15 goal rate of 105 ml/hr - Lipids 10 ml/hr on MWF d/t national shortage - Multivitamins and trace elements on MWF - Continue 140 units insulin in tna, will not adjust as elevations likely 2/2 po intake - F/U treatment plans, CBGs, am  BMP  Blenda Wisecup L. Amada Jupiter, PharmD, [place] Clinical Pharmacist Pager: [phone] 02/06/2012 8:17 AM    Addendum: received call from [facility] indicating the Dr Donne Hazel had entered orders to decrease TNA to half rate.  Will decrease TNA to 50 ml/hr.  Follow-up diet advancement for eventual d/c tna.  Thanks.  Antionette Char PharmD BCPS [phone] 02/06/2012 10:45 AM

## 2012-02-06 NOTE — Progress Notes (Signed)
12 Days Post-Op  Subjective: Having flatus, bm yesterday, no n/v tol fulls, no complaints  Objective: Vital signs in last 24 hours: Temp:  [99.2 F (37.3 C)-99.5 F (37.5 C)] 99.5 F (37.5 C) (03/24 0537) Pulse Rate:  [97-106] 99  (03/24 0537) Resp:  [14-21] 16  (03/24 0800) BP: (120-134)/(66-72) 130/68 mmHg (03/24 0537) SpO2:  [96 %-100 %] 99 % (03/24 0800) Last BM Date: 02/04/12  Intake/Output from previous day: 03/23 0701 - 03/24 0700 In: 1080 [P.O.:1080] Out: 2400 [Urine:2400] Intake/Output this shift:    General appearance: no distress GI: soft, wound clean without infection, bs present, approp tender  Lab Results:   Basename 02/06/12 0530 02/05/12 0500  WBC 7.1 8.5  HGB 11.7* 11.8*  HCT 36.1* 36.1*  PLT 353 327   BMET  Basename 02/06/12 0530 02/05/12 0500  NA 134* 137  K 4.7 4.9  CL 100 103  CO2 28 28  GLUCOSE 170* 87  BUN 11 11  CREATININE 0.89 0.93  CALCIUM 8.9 8.7    Studies/Results: Dg Chest 2 View  02/05/2012  *RADIOLOGY REPORT*  Clinical Data: Status post abdominal surgery.  Low grade fever.  CHEST - 2 VIEW  Comparison: None.  Findings: Right PICC is in place the tip in the lower superior vena cava.  Discoid opacities are present in the bases bilaterally most compatible with atelectasis.  No pneumothorax or effusion.  Lung volumes are low.  Heart size is normal.  IMPRESSION: Discoid bibasilar atelectasis.  Original Report Authenticated By: Arvid Right. Luther Parody, M.D.    Anti-infectives:  Assessment/Plan: s/p Procedure(s) (LRB): EXPLORATORY LAPAROTOMY (N/A) SMALL BOWEL RESECTION (N/A) advance diet, half tna, cont dressing changes  LOS: 17 days    [facility] 02/06/2012

## 2012-02-07 LAB — URINALYSIS, ROUTINE W REFLEX MICROSCOPIC
Bilirubin Urine: NEGATIVE
Glucose, UA: 100 mg/dL — AB
Hgb urine dipstick: NEGATIVE
Ketones, ur: NEGATIVE mg/dL
Leukocytes, UA: NEGATIVE
Nitrite: NEGATIVE
Protein, ur: NEGATIVE mg/dL
Specific Gravity, Urine: 1.015 (ref 1.005–1.030)
Urobilinogen, UA: 1 mg/dL (ref 0.0–1.0)
pH: 6 (ref 5.0–8.0)

## 2012-02-07 LAB — CBC
HCT: 34.7 % — ABNORMAL LOW (ref 39.0–52.0)
Hemoglobin: 11.5 g/dL — ABNORMAL LOW (ref 13.0–17.0)
MCH: 28.4 pg (ref 26.0–34.0)
MCHC: 33.1 g/dL (ref 30.0–36.0)
MCV: 85.7 fL (ref 78.0–100.0)
Platelets: 384 10*3/uL (ref 150–400)
RBC: 4.05 MIL/uL — ABNORMAL LOW (ref 4.22–5.81)
RDW: 13.5 % (ref 11.5–15.5)
WBC: 6.7 10*3/uL (ref 4.0–10.5)

## 2012-02-07 LAB — GLUCOSE, CAPILLARY
Glucose-Capillary: 140 mg/dL — ABNORMAL HIGH (ref 70–99)
Glucose-Capillary: 162 mg/dL — ABNORMAL HIGH (ref 70–99)
Glucose-Capillary: 182 mg/dL — ABNORMAL HIGH (ref 70–99)
Glucose-Capillary: 208 mg/dL — ABNORMAL HIGH (ref 70–99)
Glucose-Capillary: 276 mg/dL — ABNORMAL HIGH (ref 70–99)
Glucose-Capillary: 279 mg/dL — ABNORMAL HIGH (ref 70–99)
Glucose-Capillary: 81 mg/dL (ref 70–99)

## 2012-02-07 LAB — COMPREHENSIVE METABOLIC PANEL
ALT: 28 U/L (ref 0–53)
AST: 21 U/L (ref 0–37)
Albumin: 2.3 g/dL — ABNORMAL LOW (ref 3.5–5.2)
Alkaline Phosphatase: 109 U/L (ref 39–117)
BUN: 10 mg/dL (ref 6–23)
CO2: 27 mEq/L (ref 19–32)
Calcium: 8.7 mg/dL (ref 8.4–10.5)
Chloride: 99 mEq/L (ref 96–112)
Creatinine, Ser: 0.94 mg/dL (ref 0.50–1.35)
GFR calc Af Amer: >90 mL/min (ref >90)
GFR calc non Af Amer: >90 mL/min (ref >90)
Glucose, Bld: 139 mg/dL — ABNORMAL HIGH (ref 70–99)
Potassium: 4.1 mEq/L (ref 3.5–5.1)
Sodium: 135 mEq/L (ref 135–145)
Total Bilirubin: 0.5 mg/dL (ref 0.3–1.2)
Total Protein: 6.6 g/dL (ref 6.0–8.3)

## 2012-02-07 LAB — DIFFERENTIAL
Basophils Absolute: 0 10*3/uL (ref 0.0–0.1)
Basophils Relative: 0 % (ref 0–1)
Eosinophils Absolute: 0.2 10*3/uL (ref 0.0–0.7)
Eosinophils Relative: 3 % (ref 0–5)
Lymphocytes Relative: 25 % (ref 12–46)
Lymphs Abs: 1.7 10*3/uL (ref 0.7–4.0)
Monocytes Absolute: 1.1 10*3/uL — ABNORMAL HIGH (ref 0.1–1.0)
Monocytes Relative: 16 % — ABNORMAL HIGH (ref 3–12)
Neutro Abs: 3.7 10*3/uL (ref 1.7–7.7)
Neutrophils Relative %: 55 % (ref 43–77)

## 2012-02-07 LAB — PREALBUMIN: Prealbumin: 13 mg/dL — ABNORMAL LOW (ref 17.0–34.0)

## 2012-02-07 LAB — CHOLESTEROL, TOTAL: Cholesterol: 130 mg/dL (ref 0–200)

## 2012-02-07 LAB — PHOSPHORUS: Phosphorus: 4.4 mg/dL (ref 2.3–4.6)

## 2012-02-07 LAB — TRIGLYCERIDES: Triglycerides: 135 mg/dL (ref <150)

## 2012-02-07 LAB — MAGNESIUM: Magnesium: 2 mg/dL (ref 1.5–2.5)

## 2012-02-07 MED ORDER — HYDROXYZINE HCL 10 MG PO TABS
10.0000 mg | ORAL_TABLET | Freq: Four times a day (QID) | ORAL | Status: DC | PRN
  Administered 2012-02-07: 10 mg via ORAL
  Filled 2012-02-07: qty 1

## 2012-02-07 MED ORDER — OXYCODONE-ACETAMINOPHEN 5-325 MG PO TABS
1.0000 | ORAL_TABLET | ORAL | Status: DC | PRN
  Administered 2012-02-07 – 2012-02-08 (×5): 2 via ORAL
  Filled 2012-02-07 (×6): qty 2

## 2012-02-07 MED ORDER — HYDROMORPHONE 0.3 MG/ML IV SOLN
INTRAVENOUS | Status: AC
  Filled 2012-02-07: qty 25

## 2012-02-07 MED ORDER — HYDROMORPHONE HCL PF 1 MG/ML IJ SOLN
INTRAMUSCULAR | Status: AC
  Filled 2012-02-07: qty 1

## 2012-02-07 MED ORDER — INSULIN REGULAR HUMAN 100 UNIT/ML IJ SOLN: INTRAVENOUS | Status: AC

## 2012-02-07 MED ORDER — HYDROMORPHONE HCL PF 1 MG/ML IJ SOLN
0.5000 mg | Freq: Two times a day (BID) | INTRAMUSCULAR | Status: DC | PRN
  Administered 2012-02-07 – 2012-02-08 (×3): 1 mg via INTRAVENOUS
  Filled 2012-02-07 (×2): qty 1

## 2012-02-07 MED ORDER — INSULIN ASPART 100 UNIT/ML ~~LOC~~ SOLN
0.0000 [IU] | Freq: Three times a day (TID) | SUBCUTANEOUS | Status: DC
  Administered 2012-02-08: 7 [IU] via SUBCUTANEOUS
  Administered 2012-02-08: 11 [IU] via SUBCUTANEOUS

## 2012-02-07 NOTE — Progress Notes (Signed)
Physical Therapy Treatment Patient Details Name: Jared [name] MRN: [id number] DOB: 09-Sep-1966 Today's Date: 02/07/2012  PT Assessment/Plan  PT - Assessment/Plan Comments on Treatment Session: Excellent progress! Plan for Step training next session PT Plan: Discharge plan remains appropriate PT Frequency: Min 3X/week Follow Up Recommendations: No PT follow up;Supervision - Intermittent (Likely will not need HHPT) Equipment Recommended: Rolling walker with 5" wheels PT Goals  Acute Rehab PT Goals Time For Goal Achievement: 2 weeks Pt will go Supine/Side to Sit: with modified independence PT Goal: Supine/Side to Sit - Progress: Progressing toward goal Pt will go Sit to Supine/Side: with modified independence PT Goal: Sit to Supine/Side - Progress: Progressing toward goal Pt will go Sit to Stand: with modified independence PT Goal: Sit to Stand - Progress: Progressing toward goal Pt will go Stand to Sit: with modified independence PT Goal: Stand to Sit - Progress: Progressing toward goal Pt will Ambulate: >150 feet;with modified independence;with least restrictive assistive device PT Goal: Ambulate - Progress: Progressing toward goal  PT Treatment Precautions/Restrictions  Precautions Precaution Comments: NG tube Restrictions Weight Bearing Restrictions: No Mobility (including Balance) Bed Mobility Bed Mobility: Yes Rolling Left: 5: Supervision Supine to Sit: 5: Supervision;HOB flat;With rails Supine to Sit Details (indicate cue type and reason): Overall good transition Sit to Supine: 5: Supervision;With rail Sit to Supine - Details (indicate cue type and reason): Smooth transition Transfers Sit to Stand: 5: Supervision;From bed Sit to Stand Details (indicate cue type and reason): Smooth transition Stand to Sit: 5: Supervision;To bed;With upper extremity assist Stand to Sit Details: good control; safe technique Ambulation/Gait Ambulation/Gait: Yes Ambulation/Gait Assistance:  5: Supervision Ambulation/Gait Assistance Details (indicate cue type and reason): Used RW for greater ease stabilizing trunk; cues for posture, and RW proximity Ambulation Distance (Feet): 150 Feet Assistive device: Rolling walker Gait Pattern: Step-through pattern    Exercise  General Exercises - Upper Extremity Shoulder Flexion: Theraband;Both Theraband Level (Shoulder Flexion): Level 4 (Blue) End of Session General Behavior During Session: [facility] for tasks performed Cognition: [facility] for tasks performed  Roney Marion [facility] [place], [place]  02/07/2012, 5:22 PM

## 2012-02-07 NOTE — Progress Notes (Signed)
Occupational Therapy Treatment Patient Details Name: Jared [name] MRN: [id number] DOB: 05-04-1966 Today's Date: 02/07/2012  OT Assessment/Plan OT Assessment/Plan Comments on Treatment Session: Pt. very motivated.  Pt. progressing with level of independence with functional mobility and simple ADLs.  Pt. limited by pain. Pt plans to D/C home with sister until he becomes strong enough to live independently. Pt has 3 in 1. Pt needs TALL RW. Does not need BSC.Given AE to  increase indep with ADL and decrasOT Plan: Discharge plan remains appropriate OT Frequency: Min 2X/week Follow Up Recommendations: No OT follow up Equipment Recommended: Rolling walker with 5" wheels OT Goals Acute Rehab OT Goals OT Goal Formulation: With patient ADL Goals Pt Will Perform Grooming: with modified independence;Standing at sink ADL Goal: Grooming - Progress: Met Pt Will Perform Lower Body Bathing: with modified independence;Sit to stand from chair;Unsupported;with adaptive equipment ADL Goal: Lower Body Bathing - Progress: Progressing toward goals Pt Will Perform Lower Body Dressing: with modified independence;Sit to stand from chair;Unsupported;with adaptive equipment ADL Goal: Lower Body Dressing - Progress: Progressing toward goals Pt Will Transfer to Toilet: with modified independence;Ambulation;3-in-1 ADL Goal: Toilet Transfer - Progress: Progressing toward goals Pt Will Perform Toileting - Clothing Manipulation: with modified independence;Standing ADL Goal: Toileting - Clothing Manipulation - Progress: Progressing toward goals Pt Will Perform Toileting - Hygiene: Independently;Sit to stand from 3-in-1/toilet ADL Goal: Toileting - Hygiene - Progress: Progressing toward goals Pt Will Perform Tub/Shower Transfer: Tub transfer;with modified independence;with DME;Ambulation ADL Goal: Tub/Shower Transfer - Progress: Progressing toward goals Additional ADL Goal #1: Independently demonstrate 1 energy conservation  technique during ADL session ADL Goal: Additional Goal #1 - Progress: Progressing toward goals Arm Goals Pt Will Complete Theraband Exer: Independently;Bilateral upper extremities;to increase strength;Level 4 Theraband Arm Goal: Theraband Exercises - Progress: Met  OT Treatment Precautions/Restrictions  Restrictions Weight Bearing Restrictions: No   ADL ADL Eating/Feeding: Independent;Performed Grooming: Performed;Independent Where Assessed - Grooming: Standing at sink Upper Body Bathing: Simulated;Modified independent Where Assessed - Upper Body Bathing: Sitting at sink Lower Body Bathing: Simulated;Supervision/safety;Set up;Other (comment) (with use of LHS) Lower Body Bathing Details (indicate cue type and reason): using AE and cross leg technique Where Assessed - Lower Body Bathing: Sit to stand from chair Upper Body Dressing: Simulated;Modified independent Where Assessed - Upper Body Dressing: Sitting, chair Lower Body Dressing: Simulated;Minimal assistance;Other (comment) (use of AE) Where Assessed - Lower Body Dressing: Sit to stand from chair Toilet Transfer: Simulated;Supervision/safety Toilet Transfer Method: Counselling psychologist: Comfort height toilet;Grab bars Toileting - Clothing Manipulation: Simulated;Supervision/safety Where Assessed - Camera operator Manipulation: Standing Toileting - Hygiene: Independent Where Assessed - Toileting Hygiene: Standing Tub/Shower Transfer: Not assessed;Other (comment) (pt plans to sponge bath) Equipment Used: Rolling walker Ambulation Related to ADLs: mabulated with S using RW ADL Comments: Using AE to increase indep with ADL Mobility  Bed Mobility Bed Mobility: Yes Rolling Left: 5: Supervision Transfers Transfers: Yes Sit to Stand: From chair/3-in-1;From bed;With armrests;With upper extremity assist;6: Modified independent (Device/Increase time) Stand to Sit: 5: Supervision;To chair/3-in-1;With upper  extremity assist Exercises General Exercises - Upper Extremity Shoulder Flexion: Theraband;Both Theraband Level (Shoulder Flexion): Level 4 (Blue)  End of Session OT - End of Session Equipment Utilized During Treatment: Gait belt Activity Tolerance: Patient tolerated treatment well Patient left: in chair;with call bell in reach;with family/visitor present General Behavior During Session: [facility] for tasks performed Cognition: [facility] for tasks performed  [facility]  02/07/2012, 2:46 PM [facility], OTR/L  [phone] 02/07/2012

## 2012-02-07 NOTE — Progress Notes (Signed)
Internal Medicine Attending  Date: 02/07/2012  Patient name: Jared [name] Medical record number: [id number] Date of birth: 1966-01-12 Age: 46 y.o. Gender: male  I saw and evaluated the patient. I reviewed the resident's note by Dr. Nicoletta Dress and I agree with the resident's findings and plans as documented in her progress note.  Mr. Lince continues to improve clinically. His pain is markedly improved, his bowels are now moving, and he is tolerating a full diet. He has also had a marked decrease in his white blood cell count on the IV antibiotics for the possible perianastomotic fluid collection. We are converting him over to oral pain medications, stopping the TPN, and will reassess the abdominal inflammatory process prior to making a decision about stopping the antibiotics and discharge. Surgery continues to follow closely and is under the impression that from their standpoint he will be ready for discharge home soon.

## 2012-02-07 NOTE — Progress Notes (Signed)
Patient ID: Jared [name], male   DOB: 12-02-1965, 46 y.o.   MRN: [id number] 13 Days Post-Op  Subjective: Pt feeling better overall.  + flatus, +BM yesterday.  Objective: Vital signs in last 24 hours: Temp:  [99 F (37.2 C)-99.3 F (37.4 C)] 99 F (37.2 C) (03/25 0526) Pulse Rate:  [98-106] 106  (03/25 0526) Resp:  [15-18] 18  (03/25 0526) BP: (113-127)/(59-66) 127/63 mmHg (03/25 0526) SpO2:  [98 %-100 %] 98 % (03/25 0526) Last BM Date: 02/06/12  Intake/Output from previous day: 03/24 0701 - 03/25 0700 In: 1910 [P.O.:960; I.V.:600; IV Piggyback:50; TPN:300] Out: 1300 [Urine:1300] Intake/Output this shift:    PE: Abd: soft, still tender, but much less so than when I saw him last, +BS, ND, wound is packed and clean.  Lab Results:   Basename 02/07/12 0525 02/06/12 0530  WBC 6.7 7.1  HGB 11.5* 11.7*  HCT 34.7* 36.1*  PLT 384 353   BMET  Basename 02/07/12 0525 02/06/12 0530  NA 135 134*  K 4.1 4.7  CL 99 100  CO2 27 28  GLUCOSE 139* 170*  BUN 10 11  CREATININE 0.94 0.89  CALCIUM 8.7 8.9   PT/INR No results found for this basename: LABPROT:2,INR:2 in the last 72 hours CMP     Component Value Date/Time   NA 135 02/07/2012 0525   K 4.1 02/07/2012 0525   CL 99 02/07/2012 0525   CO2 27 02/07/2012 0525   GLUCOSE 139* 02/07/2012 0525   BUN 10 02/07/2012 0525   CREATININE 0.94 02/07/2012 0525   CALCIUM 8.7 02/07/2012 0525   PROT 6.6 02/07/2012 0525   ALBUMIN 2.3* 02/07/2012 0525   AST 21 02/07/2012 0525   ALT 28 02/07/2012 0525   ALKPHOS 109 02/07/2012 0525   BILITOT 0.5 02/07/2012 0525   GFRNONAA >90 02/07/2012 0525   GFRAA >90 02/07/2012 0525   Lipase     Component Value Date/Time   LIPASE 51 01/20/2012 0500       Studies/Results: Dg Chest 2 View  02/05/2012  *RADIOLOGY REPORT*  Clinical Data: Status post abdominal surgery.  Low grade fever.  CHEST - 2 VIEW  Comparison: None.  Findings: Right PICC is in place the tip in the lower superior vena cava.  Discoid opacities  are present in the bases bilaterally most compatible with atelectasis.  No pneumothorax or effusion.  Lung volumes are low.  Heart size is normal.  IMPRESSION: Discoid bibasilar atelectasis.  Original Report Authenticated By: Arvid Right. Luther Parody, M.D.    Anti-infectives: Anti-infectives     Start     Dose/Rate Route Frequency Ordered Stop   02/02/12 1400  piperacillin-tazobactam (ZOSYN) IVPB 3.375 g       3.375 g 12.5 mL/hr over 240 Minutes Intravenous 3 times per day 02/02/12 0532     02/02/12 1200   vancomycin (VANCOCIN) IVPB 1000 mg/200 mL premix  Status:  Discontinued        1,000 mg 200 mL/hr over 60 Minutes Intravenous Every 8 hours 02/02/12 1059 02/03/12 1110   01/31/12 1100   piperacillin-tazobactam (ZOSYN) IVPB 3.375 g  Status:  Discontinued        3.375 g 12.5 mL/hr over 240 Minutes Intravenous Every 8 hours 01/31/12 0957 02/02/12 0532   01/24/12 1722   ertapenem (INVANZ) 1 g in sodium chloride 0.9 % 50 mL IVPB        1 g 100 mL/hr over 30 Minutes Intravenous 60 min pre-op 01/24/12 1722 01/25/12 0948  Assessment/Plan  1. S/p SBR for crohn's disease. 2. PCM/TNA  Plan: 1. Will d/c PCA, start po pain meds 2. Dc TNA after this bag. 3. If patient doing well could look at d/c possibly as soon as tomorrow.   LOS: 18 days    Jermaine Tholl E 02/07/2012

## 2012-02-07 NOTE — Progress Notes (Signed)
CM continuing to follow, noted need for tall rolling walker. Will pt need HHRN for wound care and HHPT for increased mobility?  Please be aware that pt has orange care and could [place]s [place] [facility] pharmacy, however will need to be d/c early in the day in order to do this.  Jasmine Pang RN MPH Case Manager [phone]

## 2012-02-07 NOTE — Progress Notes (Addendum)
Subjective: Afebrile overnight, passed a lot of flatulence, +BM, Tolerated well with regular diet. No Nausea or vomiting, His abd pain is well controlled by PCA.  Ambulate with assistance  Objective: Vital signs in last 24 hours: Filed Vitals:   02/06/12 1959 02/06/12 2007 02/07/12 0005 02/07/12 0526  BP:  113/59  127/63  Pulse:  100  106  Temp:  99.3 F (37.4 C)  99 F (37.2 C)  TempSrc:  Oral  Oral  Resp: 16 18 17 18   Height:      Weight:      SpO2: 100% 99% 98% 98%   Weight change:   Intake/Output Summary (Last 24 hours) at 02/07/12 0832 Last data filed at 02/07/12 0409  Gross per 24 hour  Intake   1910 ml  Output   1300 ml  Net    610 ml   Physical Exam: General:NAD  Lungs: Bibasilar lung sounds diminished. No wheezing, rales or rhonchi noted.  Heart: RRR, , no M/G/R  Abdomen: soft, tenderness noted at LLQ with. Mild distension noted. active BS x4 Ext: No edema. SCDs on  Lab Results: Basic Metabolic Panel:  Lab 16/38/45 0525 02/06/12 0530 02/03/12 0525  Jared [name] 135 134* --  K 4.1 4.7 --  CL 99 100 --  CO2 27 28 --  GLUCOSE 139* 170* --  BUN 10 11 --  CREATININE 0.94 0.89 --  CALCIUM 8.7 8.9 --  MG 2.0 2.2 --  PHOS 4.4 -- 4.2   Liver Function Tests:  Lab 02/07/12 0525 02/03/12 0525  AST 21 20  ALT 28 17  ALKPHOS 109 106  BILITOT 0.5 1.1  PROT 6.6 6.1  ALBUMIN 2.3* 2.1*   CBC:  Lab 02/07/12 0525 02/06/12 0530  WBC 6.7 7.1  NEUTROABS 3.7 --  HGB 11.5* 11.7*  HCT 34.7* 36.1*  MCV 85.7 87.6  PLT 384 353   CBG:  Lab 02/07/12 0659 02/07/12 0424 02/07/12 0005 02/06/12 2004 02/06/12 1639 02/06/12 1107  GLUCAP 208* 81 276* 167* 208* 175*   Fasting Lipid Panel:  Lab 02/07/12 0525  CHOL 130  HDL --  LDLCALC --  TRIG 135  CHOLHDL --  LDLDIRECT --   Urinalysis:  Lab 01/31/12 1116  COLORURINE ORANGE*  LABSPEC 1.035*  PHURINE 6.0  GLUCOSEU >1000*  HGBUR NEGATIVE  BILIRUBINUR MODERATE*  KETONESUR >80*  PROTEINUR 30*  UROBILINOGEN 2.0*    NITRITE POSITIVE*  LEUKOCYTESUR SMALL*    Micro Results: Recent Results (from the past 240 hour(s))  CULTURE, BLOOD (ROUTINE X 2)     Status: Normal   Collection Time   01/31/12 10:51 AM      Component Value Range Status Comment   Specimen Description BLOOD LEFT ARM   Final    Special Requests     Final    Value: BOTTLES DRAWN AEROBIC AND ANAEROBIC AERO 10CC ANA 5CC   Culture  Setup Time [id number]   Final    Culture NO GROWTH 5 DAYS   Final    Report Status 02/06/2012 FINAL   Final   CULTURE, BLOOD (ROUTINE X 2)     Status: Normal   Collection Time   01/31/12 10:56 AM      Component Value Range Status Comment   Specimen Description BLOOD LEFT HAND   Final    Special Requests BOTTLES DRAWN AEROBIC ONLY [facility]   Final    Culture  Setup Time [id number]   Final    Culture     Final  Value: STAPHYLOCOCCUS SPECIES (COAGULASE NEGATIVE)     Note: THE SIGNIFICANCE OF ISOLATING THIS ORGANISM FROM A SINGLE SET OF BLOOD CULTURES WHEN MULTIPLE SETS ARE DRAWN IS UNCERTAIN. PLEASE NOTIFY THE MICROBIOLOGY DEPARTMENT WITHIN ONE WEEK IF SPECIATION AND SENSITIVITIES ARE REQUIRED.     Note: Gram Stain Report Called to,Read Back By and Verified With: [name] @0135  ON 02/02/2012 BY MCLET   Report Status 02/03/2012 FINAL   Final   URINE CULTURE     Status: Normal   Collection Time   01/31/12 11:16 AM      Component Value Range Status Comment   Specimen Description URINE, CLEAN CATCH   Final    Special Requests ADDED 02/01/12 1539   Final    Culture  Setup Time [id number]   Final    Colony Count NO GROWTH   Final    Culture NO GROWTH   Final    Report Status 02/02/2012 FINAL   Final   URINE CULTURE     Status: Normal   Collection Time   02/02/12  3:25 AM      Component Value Range Status Comment   Specimen Description URINE, CLEAN CATCH   Final    Special Requests NONE   Final    Culture  Setup Time [id number]   Final    Colony Count NO GROWTH   Final    Culture NO GROWTH   Final     Report Status 02/03/2012 FINAL   Final    Studies/Results: Dg Chest 2 View  02/05/2012  *RADIOLOGY REPORT*  Clinical Data: Status post abdominal surgery.  Low grade fever.  CHEST - 2 VIEW  Comparison: None.  Findings: Right PICC is in place the tip in the lower superior vena cava.  Discoid opacities are present in the bases bilaterally most compatible with atelectasis.  No pneumothorax or effusion.  Lung volumes are low.  Heart size is normal.  IMPRESSION: Discoid bibasilar atelectasis.  Original Report Authenticated By: Arvid Right. Luther Parody, M.D.   Medications: I have reviewed the patient's current medications. Scheduled Meds:   . enoxaparin  40 mg Subcutaneous Q24H  . feeding supplement  1 Container Oral Daily  . HYDROmorphone PCA 0.3 mg/mL   Intravenous Q4H  . HYDROmorphone PCA 0.3 mg/mL      . insulin aspart  0-20 Units Subcutaneous Q4H  . lidocaine  20 mL Mouth/Throat Q6H  . magic mouthwash  5 mL Oral Q6H  . piperacillin-tazobactam (ZOSYN)  IV  3.375 g Intravenous Q8H   Continuous Infusions:   . sodium chloride 0.9 % 1,000 mL with potassium chloride 30 mEq infusion 75 mL/hr at 02/07/12 0402  . TPN (CLINIMIX) +/- additives 50 mL/hr at 02/06/12 1744  . TPN (CLINIMIX) +/- additives    . DISCONTD: TPN (CLINIMIX) +/- additives 105 mL/hr at 02/05/12 2100  . DISCONTD: TPN (CLINIMIX) +/- additives    . DISCONTD: TPN (CLINIMIX) +/- additives     PRN Meds:.diphenhydrAMINE, diphenhydrAMINE, hydrALAZINE, hydrOXYzine, menthol-cetylpyridinium, naloxone, ondansetron (ZOFRAN) IV, ondansetron, phenol, sodium chloride, sodium chloride Assessment/Plan:  # Sepsis, resolving  Clinically improving. Remains afebrile without leukocytosis.   Day # 8>>Zosyn  Day # 2>> Vanc>>D/C'd on 3/21  his Blood culture 1/2 showed GPC in clusters,coagulase negative. Another Blod cx negative>>>likely contamination  Plan  - tolerating regular diet well. Less pain - will D/C IVF and D/C PCA - titrating TPN per  surgery - start oral pain meds PRN  - Broaden spectrum ABX Zosyn  - surgical service  to follow >>> conservative treatment . No surgery for now.   # Abd abscess s/p ileocecectomy 3/12 , clinical improving POD day #12   - Surgery to follow   # Pyuria  Foley D/C'd POD#1, Patient c/o urinary burning sensation and UA showed positive Nitrates, small leukocytes, WBC 02/HPF >>>negative urine culture C/o dysuria, urinary frequency and urgency again  - UA   # Hypokalemia, hypomagnesemia   -Will monitor closely and replace accordingly  # DM, type II  -Oral anti-hyperglycemics medications are on hold since admission  -TPN  -Monitor CBGs>>>experience low CBG last night at 60's. Patient asymptomatic.  # Crohn disease  Patient has had 5 years of Crohn's disease and presented with flareup on this admission. Status post ileocecectomy on 3/12.  -Will discuss with GI Dr. Paulita Fujita for outpatient medical management once his acute issue resolved        LOS: 18 days   Tiera Mensinger 02/07/2012, 8:32 AM

## 2012-02-07 NOTE — Progress Notes (Signed)
Patient interviewed and examined, agree with PA note above.  Edward Jolly MD, FACS  02/07/2012 2:14 PM

## 2012-02-08 LAB — CBC
HCT: 36.3 % — ABNORMAL LOW (ref 39.0–52.0)
Hemoglobin: 12.1 g/dL — ABNORMAL LOW (ref 13.0–17.0)
MCH: 28.4 pg (ref 26.0–34.0)
MCHC: 33.3 g/dL (ref 30.0–36.0)
MCV: 85.2 fL (ref 78.0–100.0)
Platelets: 433 10*3/uL — ABNORMAL HIGH (ref 150–400)
RBC: 4.26 MIL/uL (ref 4.22–5.81)
RDW: 13.7 % (ref 11.5–15.5)
WBC: 6.4 10*3/uL (ref 4.0–10.5)

## 2012-02-08 LAB — GLUCOSE, CAPILLARY
Glucose-Capillary: 201 mg/dL — ABNORMAL HIGH (ref 70–99)
Glucose-Capillary: 242 mg/dL — ABNORMAL HIGH (ref 70–99)
Glucose-Capillary: 251 mg/dL — ABNORMAL HIGH (ref 70–99)
Glucose-Capillary: 136 mg/dL — ABNORMAL HIGH (ref 70–99)

## 2012-02-08 LAB — BASIC METABOLIC PANEL
BUN: 9 mg/dL (ref 6–23)
CO2: 25 mEq/L (ref 19–32)
Calcium: 9.1 mg/dL (ref 8.4–10.5)
Chloride: 98 mEq/L (ref 96–112)
Creatinine, Ser: 0.91 mg/dL (ref 0.50–1.35)
GFR calc Af Amer: >90 mL/min (ref >90)
GFR calc non Af Amer: >90 mL/min (ref >90)
Glucose, Bld: 193 mg/dL — ABNORMAL HIGH (ref 70–99)
Potassium: 4.5 mEq/L (ref 3.5–5.1)
Sodium: 133 mEq/L — ABNORMAL LOW (ref 135–145)

## 2012-02-08 LAB — MAGNESIUM: Magnesium: 2.1 mg/dL (ref 1.5–2.5)

## 2012-02-08 MED ORDER — METRONIDAZOLE 500 MG PO TABS
500.0000 mg | ORAL_TABLET | Freq: Three times a day (TID) | ORAL | Status: DC
  Administered 2012-02-08: 500 mg via ORAL
  Filled 2012-02-08 (×3): qty 1

## 2012-02-08 MED ORDER — HYDROXYZINE HCL 10 MG PO TABS: 10.0000 mg | ORAL_TABLET | Freq: Four times a day (QID) | ORAL | Status: AC | PRN

## 2012-02-08 MED ORDER — CIPROFLOXACIN HCL 500 MG PO TABS: 500.0000 mg | ORAL_TABLET | Freq: Two times a day (BID) | ORAL | Status: AC

## 2012-02-08 MED ORDER — CIPROFLOXACIN HCL 500 MG PO TABS
500.0000 mg | ORAL_TABLET | Freq: Two times a day (BID) | ORAL | Status: DC
  Administered 2012-02-08: 500 mg via ORAL
  Filled 2012-02-08 (×3): qty 1

## 2012-02-08 MED ORDER — DOCUSATE SODIUM 100 MG PO CAPS: 100.0000 mg | ORAL_CAPSULE | Freq: Two times a day (BID) | ORAL | Status: AC

## 2012-02-08 MED ORDER — OXYCODONE-ACETAMINOPHEN 5-325 MG PO TABS: 1.0000 | ORAL_TABLET | ORAL | Status: AC | PRN

## 2012-02-08 MED ORDER — METRONIDAZOLE 500 MG PO TABS: 500.0000 mg | ORAL_TABLET | Freq: Three times a day (TID) | ORAL | Status: AC

## 2012-02-08 NOTE — Progress Notes (Addendum)
Met with pt re HH needs, pt states that he will be staying with his sister and she will assist with dressing changes. Will ask [facility] to provide [facility] for dressing changes and tall rolling walker.  Also discussed orange card and where pt should go to be able to use it for meds.  Jasmine Pang RN MPH Case manager [phone]     CARE MANAGEMENT NOTE 02/08/2012  Patient:  Jared [name], Jared [name]   Account Number:  [id number]  Date Initiated:  01/24/2012  Documentation initiated by:  Jasmine Pang  Subjective/Objective Assessment:   Order for assistance with medication.     Action/Plan:   Noted that pt received assistance on last visit in Aug 2012, will attempt to assist.  02/04/2012 Continuing to follow.  02/07/2012 Pt has Orange card.   Anticipated DC Date:  02/09/2012   Anticipated DC Plan:  [place]         [facility] Choice  [place]   Choice offered to / List presented to:  C-1 Patient   DME arranged  Vassie Moselle      DME agency  [place] arranged  HH-1 RN      [facility]   Status of service:  Completed, signed off Medicare Important Message given?   (If response is "NO", the following Medicare IM given date fields will be blank) Date Medicare IM given:   Date Additional Medicare IM given:    Discharge Disposition:  [place]  Per UR Regulation:  Reviewed for med. necessity/level of care/duration of stay  If discussed at [place] of Stay Meetings, dates discussed:   02/02/2012    Comments:  02/08/2012 Met with pt to discuss Roy needs, he will d/c with sister, who will assist with dressing changes per pt. Rolling walker ordered.  Jasmine Pang RN MPH  01-31-12 NGT , TPN, IVF, WBC elevated CT abd today to check for abscess , IV antibiotics started. Magdalen Spatz RN BS N     01/28/2012 Per Marcelino Scot of [facility], pt is active with that  agency, a pt at [facility] and has an orange card. Jasmine Pang RN MPH Case Manager [phone]

## 2012-02-08 NOTE — Discharge Summary (Signed)
[facility] Discharge Note  Name: Jared [name] MRN: [id number] DOB: 04/20/1966 46 y.o.  Date of Admission: 01/20/2012  4:24 AM Date of Discharge: 02/08/2012 Attending Physician: Karren Cobble, MD  Discharge Diagnosis: 1. S/P exploratory laparotomy with ileocecectomy 2. Sepsis 3. Intra-abdominal abscess 4. Hypokalemia 5. Hypomagnesemia 6. Crohn's disease 7. Diabetic mellitus, type II    Discharge Medications: Medication List  As of 02/08/2012  4:17 PM   TAKE these medications         ciprofloxacin 500 MG tablet   Commonly known as: CIPRO   Take 1 tablet (500 mg total) by mouth 2 (two) times daily.      docusate sodium 100 MG capsule   Commonly known as: COLACE   Take 1 capsule (100 mg total) by mouth every 12 (twelve) hours.      glyBURIDE 5 MG tablet   Commonly known as: DIABETA   Take 5 mg by mouth daily with breakfast.      hydrOXYzine 10 MG tablet   Commonly known as: ATARAX/VISTARIL   Take 1 tablet (10 mg total) by mouth every 6 (six) hours as needed for itching.      metFORMIN 500 MG tablet   Commonly known as: GLUCOPHAGE   Take 500 mg by mouth 2 (two) times daily with a meal.      metroNIDAZOLE 500 MG tablet   Commonly known as: FLAGYL   Take 1 tablet (500 mg total) by mouth every 8 (eight) hours.      oxyCODONE-acetaminophen 5-325 MG per tablet   Commonly known as: PERCOCET   Take 1-2 tablets by mouth every 4 (four) hours as needed.            Disposition and follow-up:   Jared [name] was discharged from [facility] in Stable condition.    Follow-up Appointments: Follow-up Information    Follow up with Rosario Adie A., MD.   He will continue to follow up with PCP for his CD management and DM control.     Follow up with CORNETT,THOMAS A., MD. Schedule an appointment as soon as possible for a visit in 2 weeks. (2-3 weeks is fine)    Contact information:   [employer], [place], [place] [phone]       Follow up with Charlann Lange, MD on 02/22/2012. (245pm)    Contact information:   [address] [place] [phone]    Please check CBC, BMP and Magnesium. Please ensure the continuous resolution of his abd abscess and recovery from his surgery. Please ensure that he has follow up visit with GI.     Discharge Orders    Future Appointments: Provider: Department: Dept Phone: Center:   02/22/2012 2:45 PM Charlann Lange, MD [facility] [phone] [facility]      Consultations:   Surgeon Dr. Erroll Luna Gastroenterologist: Dr. Teena Irani  Procedures Performed:  Dg Chest 2 View  02/05/2012  *RADIOLOGY REPORT*  Clinical Data: Status post abdominal surgery.  Low grade fever.  CHEST - 2 VIEW  Comparison: None.  Findings: Right PICC is in place the tip in the lower superior vena cava.  Discoid opacities are present in the bases bilaterally most compatible with atelectasis.  No pneumothorax or effusion.  Lung volumes are low.  Heart size is normal.  IMPRESSION: Discoid bibasilar atelectasis.  Original Report Authenticated By: Arvid Right. Luther Parody, M.D.  Abd 1 View (kub)  01/21/2012  *RADIOLOGY REPORT*  Clinical Data: Crohn's disease.  Abdominal pain.  ABDOMEN - 1 VIEW  Comparison: CT abdomen pelvis 01/20/2012.  Findings: Contrast material from the patient's CT scan is now in the ascending colon.  There is persistent dilatation of small bowel with loops measuring up to 5.5 cm.  IMPRESSION: Findings compatible with persistent partial small bowel obstruction.  Original Report Authenticated By: Arvid Right. Luther Parody, M.D.   Ct Abdomen Pelvis W Contrast  01/31/2012  *RADIOLOGY REPORT*  Clinical Data: Small bowel resection last week secondary to obstruction.  History of Crohn's disease.  Lower abdominal pain. Elevated white blood cell count.  CT ABDOMEN AND PELVIS WITH CONTRAST  Technique:  Multidetector  CT imaging of the abdomen and pelvis was performed following the standard protocol during bolus administration of intravenous contrast.  Contrast:  80 ml Omnipaque-300  Comparison: Plain films of 01/24/2012.  CT of 01/20/2012.  Findings: Patchy right greater than left bibasilar atelectasis. Normal heart size with trace right-sided pleural fluid.  Mild degradation secondary patient arm position, not raised above the head.  Normal liver.  Small volume perihepatic ascites.  A splenule.  Normal stomach, pancreas, gallbladder, biliary tract, adrenal glands.  Too small to characterize left renal lesions.  Small retroperitoneal nodes which are unchanged and likely reactive.  Similar prominent gastrohepatic ligament nodes.  Under distended sigmoid colon.  Surgical sutures in the region of the terminal ileum and cecum.  The neoterminal ileum is underdistended.  Prominent jejunal mesenteric nodes, also likely reactive.  No free intraperitoneal air.  A multiloculated fluid gas collection is identified within the leaves of the ileocolic mesentery.  This is somewhat ill-defined superiorly on image 50, but better defined more inferiorly on image 58, measures 5.3 x 3.2 cm.  It is contiguous with a central left paracentral fluid collection which measures 5.9 x 3.7 cm on image 67.  This has increased density dependently, including on images 59 and 68.  Mild surrounding mesenteric edema.  No spill of contrast in this collection.  No pelvic adenopathy.    Normal urinary bladder and prostate. Small volume complex cul-de-sac fluid on image 88.  This is hyperattenuating dependently.  Midline laparotomy changes.  Sacroiliac joint partial fusion may relate to sacroiliitis.  IMPRESSION:  1.  Complex fluid and gas collection within the ileocolic mesentery.  Increased density within.  Suspicious for developing abscess or infected hematoma.  Increased density within is favored to represent blood products.  Enteric contrast within could look  similar.  Case discussed with Dr. Ninfa Linden, at 325pm. 2.  Complex fluid within the pelvic cul-de-sac.  Also either blood products or contrast.  No surrounding inflammation or enhancement to suggest developing abscess. 3.  Bibasilar atelectasis with trace right pleural fluid. 4.  Small volume perihepatic ascites. 5.  Ileocolic anastomoses with surrounding postoperative edema. 6.  Borderline abdominal adenopathy, similar and likely reactive.  Original Report Authenticated By: Areta Haber, M.D.   Ct Abdomen Pelvis W Contrast  01/20/2012  *RADIOLOGY REPORT*  Clinical Data: 46 year old male with right lower quadrant pain, nausea, vomiting.  History of Crohn disease.  CT ABDOMEN AND PELVIS WITH CONTRAST  Technique:  Multidetector CT imaging of the abdomen and pelvis was performed following the standard protocol during bolus administration of intravenous contrast.  Contrast: 68m OMNIPAQUE IOHEXOL 300 MG/ML IJ SOLN  Comparison: 09/06/2010.  Findings: Linear atelectasis in the left lower lobe.  No pericardial or pleural effusion.  Degenerative changes at the costovertebral  junctions. No acute osseous abnormality identified.  Small volume of pelvic free fluid, decreased compared the prior. Rectum and sigmoid within normal limits.  Unremarkable bladder. Stable small pelvic phleboliths.  The colon appears normal to the level of the neo-terminal ileum. The TIA is abnormally thickened and patulous.  Wall thickening up to 7 mm (previously 9 mm).  Mild stenotic lesion in the proximal TI re-identified (series 2 image 75) with upstream flocculated material and mildly dilated small bowel loops up to the 42 mm diameter. There might be a second tandem stenosis in the distal small bowel on image 69, versus kinked appearance.  Trace interloop fluid (image 52).  Further upstream small bowel loops are dilated with some air-fluid levels.  Oral contrast has not reached the segment.  There is a low tapering of the more proximal small  bowel.  No confluent mesenteric inflammation.  The most proximal jejunum is unremarkable.  The stomach and duodenum are not dilated.   Negative liver, gallbladder, spleen and adrenal glands.  Stable prominent main pancreatic duct, otherwise negative pancreas.  Portal venous system and major arterial structures are within normal limits for age.  Stable kidneys. Small retroperitoneal and mesenteric lymph nodes are stable. No free air.  IMPRESSION: 1.  Sequelae of chronic inflammatory bowel disease.  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  Ileum wall thickening has decreased from prior. Small volume of mesenteric and pelvic free fluid. 2.  No new abnormality in the abdomen or pelvis. 3.  The left lower lobe atelectasis.  Original Report Authenticated By: Randall An, M.D.   Dg Abd 2 Views  01/24/2012  *RADIOLOGY REPORT*  Clinical Data: Crohn's disease, small bowel obstruction and worsening abdominal distention.  ABDOMEN - 2 VIEW  Comparison: 01/22/2012 and CT from 01/20/2012  Findings: Upright and supine views of the abdomen were obtained. No evidence for free air.  There is gas and contrast within the colon.  There is dilated small bowel in the right abdomen but there appears to be fewer distended small bowel loops. Limited evaluation of the lung bases and difficult to exclude left basilar atelectasis.  IMPRESSION:  There are fewer dilated loops of small bowel in the right abdomen.  Findings are suggestive for improvement in the partial small bowel obstruction.  Original Report Authenticated By: Markus Daft, M.D.   Dg Abd 2 Views  01/22/2012  *RADIOLOGY REPORT*  Clinical Data: 46 year old male - follow-up partial small bowel obstruction.  ABDOMEN - 2 VIEW  Comparison: 01/21/2012  Findings: Dilated small bowel loops of decreased in caliber since the prior study. Oral contrast within the colon is again noted. There is no evidence of pneumoperitoneum or suspicious  calcifications.  IMPRESSION: Improving dilated small bowel loops/partial small bowel obstruction.  Original Report Authenticated By: Lura Em, M.D.    Admission HPI:  Patient is a 46 y.o. male with a PMHx of Crohn's disease and diabetes , who presents to [facility] for abdominal pain and distention since yesterday evening.  Patient was fine until yesterday evening. After he had his dinner at about 6 PM he felt bloating and abdominal distention for a few hours. He also had one loose stool without any blood in it. At about 11 PM he started having sharp abdominal pain which was diffuse but more on the right side of his abdomen. He did not have any nausea or vomiting. He never noticed any blood in his stools. He did not eat or drink anything since  the onset of pain. He had similar episodes before when he was diagnosed with Crohn's disease flareup and was treated with bowel rest and steroids in the hospital. His usual Crohn's disease flareup lasts for about a week. He does not take any maintenance medication for his Crohn's disease. He has never seen GI outpatient but has seen Dr. Amedeo Plenty in the hospital. He had a colonoscopy in the hospital last year which showed polyps.   Hospital Course by problem list:  # Sepsis, Abdominal abscess s/p ileocecectomy in the setting of Crohn's disease.    Patient has a long history of Crohn's disease and does not take any maintenance medications due to medical noncompliance.  He presented with flareup on this admission. The conservative measures were initiated including bowel rest, steroids treatment and symptomatic management (Pain and Nausea/vomiting). Gastroenterologist Dr. Paulita Fujita was consulted as well. Patient had mild improvement initially but his symptoms were worsening after advancing his diet. We consulted Surgeon Dr. Brantley Stage who indicated that patient would likely need to have surgical intervention for definitive treatment due to chronic stricture associated with Crohn's  disease.  On 01/25/12, "Exploratory laparotomy with ileocecectomy" was performed by Dr. Brantley Stage.  Patient was started on bowel rest, PCA pain management, IVF hydration and TPN support post operatively. On postop day #6, he was found to have significant leukocytosis and a fever of 101.  An abdominal CT demonstrated a peri anastomotic fluid collection concerning for an abscess, which was not easily approached and drained percutaneously. Surgery evaluated Jared [name] and felt the best course was to continue the IV antibiotics and to follow him clinically.   Broaden spectrum ABX Zosyn was started along with other supportive cares including bowel rest, PCA dilaudid, IVF and TPN management.  Patient gradually gets better after 9 days of Zosyn and his clinical status has improved significantly upon discharge.  He tolerates regular diet well and his pain is well controlled with oral pain medications. Patient will be discharged home with [facility] for dressing and 7 days course of oral antibiotics cipro and flagyl.Marland Kitchen He will follow with [facility] clinic, GI  and Surgical service as an outpatient.   # Hypokalemia and agnesemia, corrected with supplement.  # DM, type II, Hb A1C 10.3 on admission. Patient has been NPO with TPN therapy during the most of his hospitalization. He is stable upon discharge.  We will resume his home oral medications, and he will continue to follow up with his PCP for optimized blood glucose management.       Discharge Vitals:  BP 120/64  Pulse 99  Temp(Src) 98.4 F (36.9 C) (Oral)  Resp 18  Ht 6' 2"  (1.88 m)  Wt 180 lb 5.4 oz (81.8 kg)  BMI 23.15 kg/m2  SpO2 98%  Discharge Labs:  Results for orders placed during the hospital encounter of 01/20/12 (from the past 24 hour(s))  GLUCOSE, CAPILLARY     Status: Abnormal   Collection Time   02/07/12  4:39 PM      Component Value Range   Glucose-Capillary 279 (*) 70 - 99 (mg/dL)   Comment 1 Notify RN     Comment 2 Documented in Chart      GLUCOSE, CAPILLARY     Status: Abnormal   Collection Time   02/07/12  9:30 PM      Component Value Range   Glucose-Capillary 162 (*) 70 - 99 (mg/dL)  BASIC METABOLIC PANEL     Status: Abnormal   Collection Time   02/08/12  5:40 AM      Component Value Range   Sodium 133 (*) 135 - 145 (mEq/L)   Potassium 4.5  3.5 - 5.1 (mEq/L)   Chloride 98  96 - 112 (mEq/L)   CO2 25  19 - 32 (mEq/L)   Glucose, Bld 193 (*) 70 - 99 (mg/dL)   BUN 9  6 - 23 (mg/dL)   Creatinine, Ser 0.91  0.50 - 1.35 (mg/dL)   Calcium 9.1  8.4 - 10.5 (mg/dL)   GFR calc non Af Amer >90  >90 (mL/min)   GFR calc Af Amer >90  >90 (mL/min)  MAGNESIUM     Status: Normal   Collection Time   02/08/12  5:40 AM      Component Value Range   Magnesium 2.1  1.5 - 2.5 (mg/dL)  CBC     Status: Abnormal   Collection Time   02/08/12  5:40 AM      Component Value Range   WBC 6.4  4.0 - 10.5 (K/uL)   RBC 4.26  4.22 - 5.81 (MIL/uL)   Hemoglobin 12.1 (*) 13.0 - 17.0 (g/dL)   HCT 36.3 (*) 39.0 - 52.0 (%)   MCV 85.2  78.0 - 100.0 (fL)   MCH 28.4  26.0 - 34.0 (pg)   MCHC 33.3  30.0 - 36.0 (g/dL)   RDW 13.7  11.5 - 15.5 (%)   Platelets 433 (*) 150 - 400 (K/uL)  GLUCOSE, CAPILLARY     Status: Abnormal   Collection Time   02/08/12  6:09 AM      Component Value Range   Glucose-Capillary 201 (*) 70 - 99 (mg/dL)  GLUCOSE, CAPILLARY     Status: Abnormal   Collection Time   02/08/12  8:49 AM      Component Value Range   Glucose-Capillary 242 (*) 70 - 99 (mg/dL)   Comment 1 Documented in Chart     Comment 2 Notify RN    GLUCOSE, CAPILLARY     Status: Abnormal   Collection Time   02/08/12 11:08 AM      Component Value Range   Glucose-Capillary 251 (*) 70 - 99 (mg/dL)    Signed: Schawn Byas 02/08/2012, 4:17 PM

## 2012-02-08 NOTE — Progress Notes (Signed)
ANTIBIOTIC CONSULT NOTE   Pharmacy Consult for Zosyn Indication: r/o intraabdominal infection/abscess  No Known Allergies  Patient Measurements: Height: 6' 2"  (188 cm) Weight: 180 lb 5.4 oz (81.8 kg) IBW/kg (Calculated) : 82.2   Vital Signs: Temp: 98.6 F (37 C) (03/26 0545) BP: 110/62 mmHg (03/26 0545) Pulse Rate: 93  (03/26 0545) Intake/Output from previous day: 03/25 0701 - 03/26 0700 In: 1800 [P.O.:1560; I.V.:240] Out: 1290 [Urine:1290] Intake/Output from this shift:    Labs:  Basename 02/08/12 0540 02/07/12 0525 02/06/12 0530  WBC 6.4 6.Jared Jared.1  HGB 12.1* 11.5* 11.Jared*  PLT 433* 384 353  LABCREA -- -- --  CREATININE 0.91 0.94 0.89   Estimated Creatinine Clearance: 118.6 ml/min (by C-G formula based on Cr of 0.91).    Microbiology: Recent Results (from the past 720 hour(s))  CLOSTRIDIUM DIFFICILE BY PCR     Status: Normal   Collection Time   01/22/12  Jared:38 PM      Component Value Range Status Comment   C difficile by pcr NEGATIVE  NEGATIVE  Final   SURGICAL PCR SCREEN     Status: Normal   Collection Time   01/24/12  8:45 PM      Component Value Range Status Comment   MRSA, PCR NEGATIVE  NEGATIVE  Final    Staphylococcus aureus NEGATIVE  NEGATIVE  Final   CULTURE, BLOOD (ROUTINE X 2)     Status: Normal   Collection Time   01/31/12 10:51 AM      Component Value Range Status Comment   Specimen Description BLOOD LEFT ARM   Final    Special Requests     Final    Value: BOTTLES DRAWN AEROBIC AND ANAEROBIC AERO 10CC ANA 5CC   Culture  Setup Time [id number]   Final    Culture NO GROWTH 5 DAYS   Final    Report Status 02/06/2012 FINAL   Final   CULTURE, BLOOD (ROUTINE X 2)     Status: Normal   Collection Time   01/31/12 10:56 AM      Component Value Range Status Comment   Specimen Description BLOOD LEFT HAND   Final    Special Requests BOTTLES DRAWN AEROBIC ONLY 5CC   Final    Culture  Setup Time [id number]   Final    Culture     Final    Value: STAPHYLOCOCCUS  SPECIES (COAGULASE NEGATIVE)     Note: THE SIGNIFICANCE OF ISOLATING THIS ORGANISM FROM A SINGLE SET OF BLOOD CULTURES WHEN MULTIPLE SETS ARE DRAWN IS UNCERTAIN. PLEASE NOTIFY THE MICROBIOLOGY DEPARTMENT WITHIN ONE WEEK IF SPECIATION AND SENSITIVITIES ARE REQUIRED.     Note: Gram Stain Report Called to,Read Back By and Verified With: [name] @0135  ON 02/02/2012 BY [facility]LET   Report Status 02/03/2012 FINAL   Final   URINE CULTURE     Status: Normal   Collection Time   01/31/12 11:16 AM      Component Value Range Status Comment   Specimen Description URINE, CLEAN CATCH   Final    Special Requests ADDED 02/01/12 1539   Final    Culture  Setup Time [id number]   Final    Colony Count NO GROWTH   Final    Culture NO GROWTH   Final    Report Status 02/02/2012 FINAL   Final   URINE CULTURE     Status: Normal   Collection Time   02/02/12  3:25 AM      Component Value  Range Status Comment   Specimen Description URINE, CLEAN CATCH   Final    Special Requests NONE   Final    Culture  Setup Time [id number]   Final    Colony Count NO GROWTH   Final    Culture NO GROWTH   Final    Report Status 02/03/2012 FINAL   Final     Medical History: Past Medical History  Diagnosis Date  . Crohn disease 2008    with hospital admission in 2011, and 8/12 for flare up and SBO relieved with bowel rest. no suregery, no meds for CD  . Diabetes mellitus type II     Medications:  Prescriptions prior to admission  Medication Sig Dispense Refill  . glyBURIDE (DIABETA) 5 MG tablet Take 5 mg by mouth daily with breakfast.      . metFORMIN (GLUCOPHAGE) 500 MG tablet Take 500 mg by mouth 2 (two) times daily with a meal.       Assessment: 46 yo Jared [name] with h/o Chron's disease s/p small bowel resection on empiric zosyn day #9 for possible perianastomotic fluid collection/ infection.  Aware of plans to reassess the abd inflammatory process prior to stopping antibiotics.  CrCl remains >100 ml/min.  Goal of Therapy:   Appropriate antibiotic therapy  Plan:  Contine zosyn 3.375 gm IV q8 hours. Follow up length of therapy.  [employer], Pharm.D., BCPS Clinical Pharmacist Pager [phone] 02/08/2012 9:14 AM

## 2012-02-08 NOTE — Progress Notes (Signed)
Physical Therapy Treatment Patient Details Name: Jared [name] MRN: [id number] DOB: 08-12-1966 Today's Date: 02/08/2012  PT Assessment/Plan  PT - Assessment/Plan Comments on Treatment Session: Able to amb safely with RW, and no need for stair training as pt's sister's place has no steps to enter per pt;  No further skilled PT needs for Mr. Chait;  Will sign off, and encouraged pt to ambulate in hallways at least 3x/day while here PT Plan: Discharge plan remains appropriate PT Frequency: Other (Comment) (Will dc PT ) Follow Up Recommendations: No PT follow up;Supervision - Intermittent Equipment Recommended: Rolling walker with 5" wheels (Delivered to room and height adjusted to pt) PT Goals  Acute Rehab PT Goals Pt will go Supine/Side to Sit: with modified independence PT Goal: Supine/Side to Sit - Progress: Met Pt will go Sit to Supine/Side: with modified independence PT Goal: Sit to Supine/Side - Progress: Met Pt will go Sit to Stand: with modified independence PT Goal: Sit to Stand - Progress: Met Pt will go Stand to Sit: with modified independence PT Goal: Stand to Sit - Progress: Met Pt will Ambulate: >150 feet;with modified independence;with least restrictive assistive device PT Goal: Ambulate - Progress: Met Pt will Go Up / Down Stairs: 3-5 stairs;with modified independence;with rail(s) PT Goal: Up/Down Stairs - Progress: Other (comment) (no need for stair goal)  PT Treatment Precautions/Restrictions  Precautions Precaution Comments: NG tube Restrictions Weight Bearing Restrictions: No Mobility (including Balance) Bed Mobility Supine to Sit: 6: Modified independent (Device/Increase time) Sitting - Scoot to Edge of Bed: 6: Modified independent (Device/Increase time) Sit to Supine: 6: Modified independent (Device/Increase time) Transfers Sit to Stand: 6: Modified independent (Device/Increase time) Sit to Stand Details (indicate cue type and reason): Steady standing to  RW Stand to Sit: 6: Modified independent (Device/Increase time) Ambulation/Gait Ambulation/Gait Assistance: 6: Modified independent (Device/Increase time) Ambulation/Gait Assistance Details (indicate cue type and reason): Better posture today, managing with RW well Ambulation Distance (Feet): 150 Feet Assistive device: Rolling walker Gait Pattern: Step-through pattern Gait velocity: WNL Stairs:  (Pt states his sister doesn't have steps to enter)       End of Session PT - End of Session Activity Tolerance: Patient tolerated treatment well Patient left: in bed;with call bell in reach Nurse Communication: Mobility status for ambulation General Behavior During Session: [facility] for tasks performed Cognition: [facility] for tasks performed  Roney Marion [facility] [place], [place]  02/08/2012, 1:58 PM

## 2012-02-08 NOTE — Progress Notes (Signed)
S: patient feels better, No N/V. Tolerated diet well. abd pain controlled by pain meds. Passing flatulence.  O General: NAD     Lungs: CTA B/L     Heart RRR. No M/G/R     Abd: Soft, BS x 4, midline abd incision with dressing     Ext: No edema A/P - ok to D/C from Surgical stand point - will D/C home today and give him cipro and flagyl to finish his ABX course. - will give him percocet for pain control - F/U appt with [facility] clinic in 2 weeks - Surgical group will f/u in 2-3 weeks. - patient understands and agrees with plan.

## 2012-02-08 NOTE — Progress Notes (Signed)
Patient ID: Jared [name], male   DOB: February 03, 1966, 46 y.o.   MRN: [id number] 14 Days Post-Op  Subjective: Patient feels pretty well.  Percocet controls pain fairly well.  Tolerating regular diet.  Objective: Vital signs in last 24 hours: Temp:  [98.6 F (37 C)-99.2 F (37.3 C)] 98.6 F (37 C) (03/26 0545) Pulse Rate:  [93-96] 93  (03/26 0545) Resp:  [18-20] 20  (03/26 0545) BP: (110-111)/(62-64) 110/62 mmHg (03/26 0545) SpO2:  [96 %-98 %] 97 % (03/26 0545) Last BM Date: 02/06/12  Intake/Output from previous day: 03/25 0701 - 03/26 0700 In: 1800 [P.O.:1560; I.V.:240] Out: 1290 [Urine:1290] Intake/Output this shift: Total I/O In: -  Out: 400 [Urine:400]  PE: Abd: soft, wound packed and dressed, +BS, ND  Lab Results:   [facility] 02/08/12 0540 02/07/12 0525  WBC 6.4 6.7  HGB 12.1* 11.5*  HCT 36.3* 34.7*  PLT 433* 384   BMET  Basename 02/08/12 0540 02/07/12 0525  NA 133* 135  K 4.5 4.1  CL 98 99  CO2 25 27  GLUCOSE 193* 139*  BUN 9 10  CREATININE 0.91 0.94  CALCIUM 9.1 8.7   PT/INR No results found for this basename: LABPROT:2,INR:2 in the last 72 hours CMP     Component Value Date/Time   NA 133* 02/08/2012 0540   K 4.5 02/08/2012 0540   CL 98 02/08/2012 0540   CO2 25 02/08/2012 0540   GLUCOSE 193* 02/08/2012 0540   BUN 9 02/08/2012 0540   CREATININE 0.91 02/08/2012 0540   CALCIUM 9.1 02/08/2012 0540   PROT 6.6 02/07/2012 0525   ALBUMIN 2.3* 02/07/2012 0525   AST 21 02/07/2012 0525   ALT 28 02/07/2012 0525   ALKPHOS 109 02/07/2012 0525   BILITOT 0.5 02/07/2012 0525   GFRNONAA >90 02/08/2012 0540   GFRAA >90 02/08/2012 0540   Lipase     Component Value Date/Time   LIPASE 51 01/20/2012 0500       Studies/Results: No results found.  Anti-infectives: Anti-infectives     Start     Dose/Rate Route Frequency Ordered Stop   02/08/12 1400   metroNIDAZOLE (FLAGYL) tablet 500 mg        500 mg Oral 3 times per day 02/08/12 1016     02/08/12 1030   ciprofloxacin  (CIPRO) tablet 500 mg        500 mg Oral 2 times daily 02/08/12 1016     02/02/12 1400   piperacillin-tazobactam (ZOSYN) IVPB 3.375 g  Status:  Discontinued        3.375 g 12.5 mL/hr over 240 Minutes Intravenous 3 times per day 02/02/12 0532 02/08/12 1016   02/02/12 1200   vancomycin (VANCOCIN) IVPB 1000 mg/200 mL premix  Status:  Discontinued        1,000 mg 200 mL/hr over 60 Minutes Intravenous Every 8 hours 02/02/12 1059 02/03/12 1110   01/31/12 1100   piperacillin-tazobactam (ZOSYN) IVPB 3.375 g  Status:  Discontinued        3.375 g 12.5 mL/hr over 240 Minutes Intravenous Every 8 hours 01/31/12 0957 02/02/12 0532   01/24/12 1722   ertapenem (INVANZ) 1 g in sodium chloride 0.9 % 50 mL IVPB        1 g 100 mL/hr over 30 Minutes Intravenous 60 min pre-op 01/24/12 1722 01/25/12 0948           Assessment/Plan  1. S/p SBR for crohn's stricture 2. Post op ileus, resolved 3. Fluid collection  Plan: 1. D/C  zosyn, change to po Cipro, Flagyl.  Would recommend another 7 days of PO cip/flag at home. 2. I have set up [facility] for patient for dressing changes and for RW rec by PT. 3. Patient is surgically stable for discharge home today.  We will follow while here. 4. He needs to return to see Dr. Brantley Stage in our office in 2-3 weeks.   LOS: 19 days    [name] 02/08/2012

## 2012-02-08 NOTE — Progress Notes (Signed)
Internal Medicine Attending  Date: 02/08/2012  Patient name: Jared [name] Medical record number: [id number] Date of birth: Jul 18, 1966 Age: 46 y.o. Gender: male  I saw and evaluated the patient. I reviewed the resident's note by Dr. Nicoletta Dress and I agree with the resident's findings and plans as documented in her progress note.  Jared [name] continues to feel better. He is passing gas as well as having bowel movements. He is tolerating his regular diet well. He has had no recent signs or symptoms concerning for infection. Surgery has cleared him for discharge from their standpoint. He will be followed up in their clinic in 2-3 weeks. We will discharge Jared [name] today with an additional 5 days of Cipro and Flagyl. He will also be followed up in the [facility].

## 2012-02-08 NOTE — Discharge Instructions (Signed)
Normal Saline wet to dry dressing changes to abdominal wound daily  [facility], [place] [phone]  OPEN ABDOMINAL SURGERY: POST OP INSTRUCTIONS  Always review your discharge instruction sheet given to you by the facility where your surgery was performed.  IF YOU HAVE DISABILITY OR FAMILY LEAVE FORMS, YOU MUST BRING THEM TO THE OFFICE FOR PROCESSING.  PLEASE DO NOT GIVE THEM TO YOUR DOCTOR.  1. A prescription for pain medication may be given to you upon discharge.  Take your pain medication as prescribed, if needed.  If narcotic pain medicine is not needed, then you may take acetaminophen (Tylenol) or ibuprofen (Advil) as needed. 2. Take your usually prescribed medications unless otherwise directed. 3. If you need a refill on your pain medication, please contact your pharmacy. They will contact our office to request authorization.  Prescriptions will not be filled after 5pm or on week-ends. 4. You should follow a light diet the first few days after arrival home, such as soup and crackers, pudding, etc.unless your doctor has advised otherwise. A high-fiber, low fat diet can be resumed as tolerated.   Be sure to include lots of fluids daily. Most patients will experience some swelling and bruising on the chest and neck area.  Ice packs will help.  Swelling and bruising can take several days to resolve 5. Most patients will experience some swelling and bruising in the area of the incision. Ice pack will help. Swelling and bruising can take several days to resolve..  6. It is common to experience some constipation if taking pain medication after surgery.  Increasing fluid intake and taking a stool softener will usually help or prevent this problem from occurring.  A mild laxative (Milk of Magnesia or Miralax) should be taken according to package directions if there are no bowel movements after 48 hours. 7.  You may have steri-strips (small skin tapes) in place directly over the  incision.  These strips should be left on the skin for 7-10 days.  If your surgeon used skin glue on the incision, you may shower in 24 hours.  The glue will flake off over the next 2-3 weeks.  Any sutures or staples will be removed at the office during your follow-up visit. You may find that a light gauze bandage over your incision may keep your staples from being rubbed or pulled. You may shower and replace the bandage daily. 8. ACTIVITIES:  You may resume regular (light) daily activities beginning the next day--such as daily self-care, walking, climbing stairs--gradually increasing activities as tolerated.  You may have sexual intercourse when it is comfortable.  Refrain from any heavy lifting or straining until approved by your doctor. a. You may drive when you no longer are taking prescription pain medication, you can comfortably wear a seatbelt, and you can safely maneuver your car and apply brakes b. Return to Work: ___________________________________ 6. You should see your doctor in the office for a follow-up appointment approximately two weeks after your surgery.  Make sure that you call for this appointment within a day or two after you arrive home to insure a convenient appointment time. OTHER INSTRUCTIONS:  _____________________________________________________________ _____________________________________________________________  WHEN TO CALL YOUR DOCTOR: 1. Fever over 101.0 2. Inability to urinate 3. Nausea and/or vomiting 4. Extreme swelling or bruising 5. Continued bleeding from incision. 6. Increased pain, redness, or drainage from the incision. 7. Difficulty swallowing or breathing 8. Muscle cramping or spasms. 9. Numbness or tingling in hands  or feet or around lips.  The clinic staff is available to answer your questions during regular business hours.  Please don't hesitate to call and ask to speak to one of the nurses if you have concerns.  For further questions, please visit  [email or web address]

## 2012-02-13 DIAGNOSIS — E876 Hypokalemia: Secondary | ICD-10-CM | POA: Diagnosis not present

## 2012-02-13 DIAGNOSIS — A419 Sepsis, unspecified organism: Secondary | ICD-10-CM | POA: Diagnosis not present

## 2012-02-13 DIAGNOSIS — Z9889 Other specified postprocedural states: Secondary | ICD-10-CM

## 2012-02-13 DIAGNOSIS — K651 Peritoneal abscess: Secondary | ICD-10-CM | POA: Diagnosis not present

## 2012-02-17 NOTE — OR Nursing (Signed)
Late entry; procedure end time added and verified 02/17/2012.

## 2012-02-21 ENCOUNTER — Ambulatory Visit (INDEPENDENT_AMBULATORY_CARE_PROVIDER_SITE_OTHER): Payer: PRIVATE HEALTH INSURANCE | Admitting: Surgery

## 2012-02-21 ENCOUNTER — Encounter (INDEPENDENT_AMBULATORY_CARE_PROVIDER_SITE_OTHER): Payer: Self-pay | Admitting: Surgery

## 2012-02-21 VITALS — BP 135/75 | HR 105 | Temp 96.4°F | Ht 74.0 in | Wt 206.0 lb

## 2012-02-21 DIAGNOSIS — Z9889 Other specified postprocedural states: Secondary | ICD-10-CM

## 2012-02-21 NOTE — Progress Notes (Signed)
Patient returns in follow up after ileocecectomy for Crohn's disease. He is almost 4 weeks out. He is doing well. No major complaints. Tolerating diet. Bowels functioning.  Exam: Midline wound is open I removed 4 stitches. It is clean. I placed Neosporin on the incision and covered with dry dressing. Otherwise, abdomen soft nontender.  Impression: Status post ileocecectomy for Crohn's  Plan: He is to see his gastroenterologist tomorrow. Return to clinic in 2 weeks. Probably back to work after that.

## 2012-02-21 NOTE — Patient Instructions (Signed)
Stop wet to dry dressings.  Cover wound with neosporin daily and dry gauze.  OK to get it wet.

## 2012-02-22 ENCOUNTER — Ambulatory Visit (INDEPENDENT_AMBULATORY_CARE_PROVIDER_SITE_OTHER): Payer: Self-pay | Admitting: Internal Medicine

## 2012-02-22 ENCOUNTER — Encounter: Payer: Self-pay | Admitting: Internal Medicine

## 2012-02-22 VITALS — BP 125/63 | HR 93 | Temp 97.9°F | Ht 74.0 in | Wt 205.7 lb

## 2012-02-22 DIAGNOSIS — K5090 Crohn's disease, unspecified, without complications: Secondary | ICD-10-CM

## 2012-02-22 DIAGNOSIS — Z9889 Other specified postprocedural states: Secondary | ICD-10-CM

## 2012-02-22 DIAGNOSIS — E876 Hypokalemia: Secondary | ICD-10-CM

## 2012-02-22 DIAGNOSIS — E119 Type 2 diabetes mellitus without complications: Secondary | ICD-10-CM

## 2012-02-22 MED ORDER — ACETAMINOPHEN-CODEINE 300-30 MG PO TABS: 1.0000 | ORAL_TABLET | Freq: Four times a day (QID) | ORAL | Status: DC | PRN

## 2012-02-22 NOTE — Assessment & Plan Note (Signed)
BMP is rechecked at clinic

## 2012-02-22 NOTE — Progress Notes (Signed)
Patient ID: Jared [name], male   DOB: 09/20/1966, 46 y.o.   MRN: [id number]   Subjective:   Patient ID: Jared [name] male   DOB: 1966/09/26 46 y.o.   MRN: [id number]  HPI: Mr.Jared [name] is a 46 y.o. man with PMH of recent hospitalization for exploratory laparotomy with ileocecectomy, Crohn's disease and DM type II who presents to the clinic for hospital follow up.  Patient states that he has been doing well since the discharge. He reports that he is able to tolerate regular diet well with good bowel functioning.  He finished the course of Cipro and Flagyl. No major complaints except for some mild intermittent abdominal incisional pain.   No fever, chills. No shortness of breath or dyspnea on exertion. No chest pain, chest pressure or palpitation No nausea or vomiting. No melena, diarrhea or incontinence. No muscle weakness.  Denies depression. No appetite or weight changes.   Of note, patient had follow up visit with his surgeon yesterday with no complications noted. He will continue to follow up with the surgeon in 2 weeks.   Past Medical History  Diagnosis Date  . Crohn disease 2008    with hospital admission in 2011, and 8/12 for flare up and SBO relieved with bowel rest. no suregery, no meds for CD  . Diabetes mellitus type II    Current Outpatient Prescriptions  Medication Sig Dispense Refill  . glyBURIDE (DIABETA) 5 MG tablet Take 5 mg by mouth daily with breakfast.      . metFORMIN (GLUCOPHAGE) 500 MG tablet Take 500 mg by mouth 2 (two) times daily with a meal.      . Acetaminophen-Codeine 300-30 MG per tablet Take 1-2 tablets by mouth every 6 (six) hours as needed for pain.  60 tablet  0  . DISCONTD: furosemide (LASIX) 10 MG/ML solution Take by mouth daily.      Marland Kitchen DISCONTD: mesalamine (PENTASA) 250 MG CR capsule Take 1,000 mg by mouth 4 (four) times daily.      Marland Kitchen DISCONTD: pioglitazone (ACTOS) 15 MG tablet Take by mouth daily.       Family History  Problem Relation Age of  Onset  . Cancer Mother     lung   History   Social History  . Marital Status: Single    Spouse Name: N/A    Number of Children: N/A  . Years of Education: N/A   Social History Main Topics  . Smoking status: Former Smoker -- 0.2 packs/day for 30 years    Types: Cigarettes  . Smokeless tobacco: Former Systems developer    Types: [place] date: 01/21/2012   Comment: consult entered (01/20/12)  . Alcohol Use: Yes     01/20/12 "a beer ~ q 3-4 months"  . Drug Use: No  . Sexually Active: Not Currently   Other Topics Concern  . None   Social History Narrative   Lives in [place].Is single. Has a sister in [place]. Smokes 2-3 cigarettes a day. Drinks beer once every few months. No history of drug abuse. Studied up to 12th grade. Has orange card and follows with [facility] . No health insurance. 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   Review of Systems: See HPI  Objective:  Physical Exam: Filed Vitals:   02/22/12 1431  BP: 125/63  Pulse: 93  Temp: 97.9 F (36.6 C)  TempSrc: Oral  Height: 6' 2"  (1.88 m)  Weight: 205 lb 11.2 oz (93.305 kg)  SpO2:  99%   General: alert, well-developed, and cooperative to examination.  Head: normocephalic and atraumatic.  Eyes: vision grossly intact, pupils equal, pupils round, pupils reactive to light, no injection and anicteric.  Mouth: pharynx pink and moist, no erythema, and no exudates.  Neck: supple, full ROM, no thyromegaly, no JVD, and no carotid bruits.  Lungs: normal respiratory effort, no accessory muscle use, normal breath sounds, no crackles, and no wheezes. Heart: normal rate, regular rhythm, no murmur, no gallop, and no rub.  Abdomen: soft, non-tender, normal bowel sounds, no distention, no guarding, no rebound tenderness, no hepatomegaly, and no splenomegaly. Midline surgical incision covered with dressing. Msk: no joint swelling, no joint warmth, and no redness over joints.  Pulses: 2+ DP/PT pulses  bilaterally Extremities: No cyanosis, clubbing, edema Neurologic: alert & oriented X3, cranial nerves II-XII intact, strength normal in all extremities, sensation intact to light touch, and gait normal.  Skin: turgor normal and no rashes.  Psych: Oriented X3, memory intact for recent and remote, normally interactive, good eye contact, not anxious appearing, and not depressed appearing.   Assessment & Plan:

## 2012-02-22 NOTE — Patient Instructions (Addendum)
1. follow up with your PCP next week 2. Follow up with your [facility] this week. 3. Follow up with your Surgeon as scheduled. 4. DO NOT TAKE OVER THE COUNTER NSAIDS (ibuprofen, aleeve).

## 2012-02-22 NOTE — Assessment & Plan Note (Signed)
Mg is rechecked today at clinic

## 2012-02-22 NOTE — Assessment & Plan Note (Signed)
Patient is doing well and has had a good recovery after the discharge.  - Will follow up with [facility] in one week.  - will continue to follow up with his surgeon in 2 weeks - will [facility].

## 2012-02-22 NOTE — Assessment & Plan Note (Signed)
See HPI  - will continue to follow up with [facility].

## 2012-02-22 NOTE — Assessment & Plan Note (Signed)
Stable. He will need to follow up with his PCP at [facility].

## 2012-02-23 LAB — CBC
HCT: 38.8 % — ABNORMAL LOW (ref 39.0–52.0)
Hemoglobin: 12.5 g/dL — ABNORMAL LOW (ref 13.0–17.0)
MCH: 28 pg (ref 26.0–34.0)
MCHC: 32.2 g/dL (ref 30.0–36.0)
MCV: 87 fL (ref 78.0–100.0)
Platelets: 396 10*3/uL (ref 150–400)
RBC: 4.46 MIL/uL (ref 4.22–5.81)
RDW: 15.3 % (ref 11.5–15.5)
WBC: 8.8 10*3/uL (ref 4.0–10.5)

## 2012-02-23 LAB — BASIC METABOLIC PANEL WITH GFR
BUN: 13 mg/dL (ref 6–23)
CO2: 18 mEq/L — ABNORMAL LOW (ref 19–32)
Calcium: 9.3 mg/dL (ref 8.4–10.5)
Chloride: 105 mEq/L (ref 96–112)
Creat: 0.74 mg/dL (ref 0.50–1.35)
GFR, Est African American: >89 mL/min
GFR, Est Non African American: >89 mL/min
Glucose, Bld: 168 mg/dL — ABNORMAL HIGH (ref 70–99)
Potassium: 4.1 mEq/L (ref 3.5–5.3)
Sodium: 140 mEq/L (ref 135–145)

## 2012-02-23 LAB — MAGNESIUM: Magnesium: 1.7 mg/dL (ref 1.5–2.5)

## 2012-03-14 ENCOUNTER — Encounter (INDEPENDENT_AMBULATORY_CARE_PROVIDER_SITE_OTHER): Payer: Self-pay | Admitting: Surgery

## 2012-03-14 ENCOUNTER — Ambulatory Visit (INDEPENDENT_AMBULATORY_CARE_PROVIDER_SITE_OTHER): Payer: PRIVATE HEALTH INSURANCE | Admitting: Surgery

## 2012-03-14 VITALS — BP 114/68 | HR 84 | Temp 97.6°F | Resp 16 | Ht 74.0 in | Wt 212.5 lb

## 2012-03-14 DIAGNOSIS — Z9889 Other specified postprocedural states: Secondary | ICD-10-CM

## 2012-03-14 NOTE — Patient Instructions (Signed)
Eat what you want.  Follow up 3 months

## 2012-03-14 NOTE — Progress Notes (Signed)
The patient returns. He is doing well.  Exam: Midline incision healed well. Abdominal diastases noted.  Impression: Status post ileocecectomy for Crohn's disease 6 weeks ago doing well  Plan: Return in 3 months for wound check. He does have diastases and is at increased risk for incisional hernia.

## 2012-04-24 ENCOUNTER — Emergency Department (HOSPITAL_COMMUNITY)
Admission: EM | Admit: 2012-04-24 | Discharge: 2012-04-24 | Disposition: A | Discharge status: Home / Self Care | Payer: Self-pay | Attending: Emergency Medicine | Admitting: Emergency Medicine

## 2012-04-24 ENCOUNTER — Encounter (HOSPITAL_COMMUNITY): Payer: Self-pay

## 2012-04-24 DIAGNOSIS — H9209 Otalgia, unspecified ear: Secondary | ICD-10-CM | POA: Insufficient documentation

## 2012-04-24 DIAGNOSIS — K5090 Crohn's disease, unspecified, without complications: Secondary | ICD-10-CM | POA: Insufficient documentation

## 2012-04-24 DIAGNOSIS — E119 Type 2 diabetes mellitus without complications: Secondary | ICD-10-CM | POA: Insufficient documentation

## 2012-04-24 DIAGNOSIS — Z87891 Personal history of nicotine dependence: Secondary | ICD-10-CM | POA: Insufficient documentation

## 2012-04-24 MED ORDER — NEOMYCIN-POLYMYXIN-HC 3.5-10000-1 OT SUSP: 3.0000 [drp] | Freq: Three times a day (TID) | OTIC | Status: AC

## 2012-04-24 MED ORDER — AMOXICILLIN 500 MG PO CAPS: 500.0000 mg | ORAL_CAPSULE | Freq: Three times a day (TID) | ORAL | Status: AC

## 2012-04-24 NOTE — ED Provider Notes (Signed)
History    This chart was scribed for Sharyon Cable, MD, MD by Rhae Lerner. The patient was seen in room STRE2 and the patient's care was started at 2:31PM.   CSN: [id number]  Arrival date & time 04/24/12  1332   First MD Initiated Contact with Patient 04/24/12 1358      Chief Complaint  Patient presents with  . Otalgia    Patient is a 46 y.o. male presenting with ear pain. The history is provided by the patient.  Otalgia   Jared [name] is a 46 y.o. male who presents to the Emergency Department complaining of moderate right otalgia onset 5 days ago. Denies any ear drainage and injury.  Pt reports that he has Crohn's disease. Symptoms have been constant since onset without radiation. There was sudden onset.  No trauma to ear  Past Medical History  Diagnosis Date  . Crohn disease 2008    with hospital admission in 2011, and 8/12 for flare up and SBO relieved with bowel rest. no suregery, no meds for CD  . Diabetes mellitus type II     Past Surgical History  Procedure Date  . 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 Scrotal surgery ~ 1971    "took out a pellet"  . Laparotomy 01/25/2012    Procedure: EXPLORATORY LAPAROTOMY;  Surgeon: [name], MD;  Location: [place];  Service: General;  Laterality: N/A;  . Bowel resection 01/25/2012    Procedure: SMALL BOWEL RESECTION;  Surgeon: [name], MD;  Location: [facility];  Service: General;  Laterality: N/A;    Family History  Problem Relation Age of Onset  . Cancer Mother     lung    History  Substance Use Topics  . Smoking status: Former Smoker -- 0.2 packs/day for 30 years    Types: Cigarettes    Quit date: 01/21/2012  . Smokeless tobacco: Former Systems developer    Types: [place] date: 01/21/2012   Comment: consult entered (01/20/12)  . Alcohol Use: Yes     01/20/12 "a beer ~ q 3-4 months"      Review of Systems  Constitutional: Negative for fever.  HENT: Positive for ear pain.      Allergies  Review of patient's allergies indicates no known allergies.  Home Medications   Current Outpatient Rx  Name Route Sig Dispense Refill  . GLYBURIDE 5 MG PO TABS Oral Take 5 mg by mouth daily with breakfast.    . INSULIN GLARGINE 100 UNIT/ML [place] SOLN Subcutaneous Inject 20 Units into the skin at bedtime.    Marland Kitchen METFORMIN HCL 500 MG PO TABS Oral Take 500 mg by mouth 2 (two) times daily with a meal.      BP 109/9  Pulse 81  Temp(Src) 98 F (36.7 C) (Oral)  Resp 18  SpO2 99%  Physical Exam  Nursing note and vitals reviewed.  CONSTITUTIONAL: Well developed/well nourished HEAD AND FACE: Normocephalic/atraumatic EYES: EOMI/PERRL ENMT: Mucous membranes moist, right tm obscured by drainage, but ears symmetric, no external ear/facial changes NECK: supple no meningeal signsder CV: S1/S2 noted, no murmurs/rubs/gallops noted LUNGS: Lungs are clear to auscultation bilaterally, no apparent distress ABDOMEN: soft, nontender, no rebound or guarding NEURO: Pt is awake/alert, moves all extremitiesx4 EXTREMITIES: pulses normal, full ROM SKIN: warm, color normal PSYCH: no abnormalities of mood noted  ED Course  Procedures  DIAGNOSTIC STUDIES: Oxygen Saturation is 99% on room air, normal by my  interpretation.    COORDINATION OF CARE: 2:34PM EDP discusses pt ED treatment with pt.    Suspect otitis externa, though could have serous otitis media with otorrhea Will start oral and otic suspension abx  The patient appears reasonably screened and/or stabilized for discharge and I doubt any other medical condition or other [facility] requiring further screening, evaluation, or treatment in the ED at this time prior to discharge.   MDM  Nursing notes including past medical history, social history and family history reviewed and considered in documentation   I personally performed the services described in this documentation, which was scribed in my presence. The recorded information has  been reviewed and considered.          Sharyon Cable, MD 04/24/12 1606

## 2012-04-24 NOTE — ED Notes (Signed)
Rt. Earache for 5 days

## 2012-05-25 ENCOUNTER — Encounter (INDEPENDENT_AMBULATORY_CARE_PROVIDER_SITE_OTHER): Payer: Self-pay | Admitting: Surgery

## 2012-05-25 ENCOUNTER — Ambulatory Visit (INDEPENDENT_AMBULATORY_CARE_PROVIDER_SITE_OTHER): Payer: PRIVATE HEALTH INSURANCE | Admitting: Surgery

## 2012-05-25 ENCOUNTER — Other Ambulatory Visit: Payer: Self-pay

## 2012-05-25 VITALS — BP 100/70 | HR 84 | Temp 97.2°F | Resp 18 | Ht 74.0 in | Wt 215.0 lb

## 2012-05-25 DIAGNOSIS — M79609 Pain in unspecified limb: Secondary | ICD-10-CM

## 2012-05-25 DIAGNOSIS — K5090 Crohn's disease, unspecified, without complications: Secondary | ICD-10-CM

## 2012-05-25 DIAGNOSIS — I70219 Atherosclerosis of native arteries of extremities with intermittent claudication, unspecified extremity: Secondary | ICD-10-CM

## 2012-05-25 DIAGNOSIS — M79606 Pain in leg, unspecified: Secondary | ICD-10-CM

## 2012-05-25 NOTE — Progress Notes (Signed)
Subjective:     Patient ID: Jared [name], male   DOB: [birth year], 46 y.o.   MRN: [id number]  HPIPatient returns in followup after his ileocecectomy Crohn stricture in March 2013. He is complaining of some calf pain with walking. He complains of burning and cramping after walking for 15-30 minutes this seems to go away. Every time he walks he has these symptoms. He has a history of tobacco abuse and diabetes. Denies chest pain or shortness of breath.   Review of Systems  Constitutional: Negative.   HENT: Negative.   Gastrointestinal: Negative.        Objective:   Physical Exam  Constitutional: He appears well-developed and well-nourished.  HENT:  Head: Normocephalic and atraumatic.  Eyes: EOM are normal. Pupils are equal, round, and reactive to light.  Cardiovascular:  Pulses:      Carotid pulses are 1+ on the right side, and 1+ on the left side.      Radial pulses are 1+ on the right side, and 1+ on the left side.       Femoral pulses are 1+ on the right side, and 1+ on the left side.      Popliteal pulses are 1+ on the right side, and 1+ on the left side.       Dorsalis pedis pulses are 1+ on the right side, and 1+ on the left side.       Posterior tibial pulses are 1+ on the right side, and 1+ on the left side.  Abdominal:         Assessment:     S/p ileocecectomy for Crohn's doing well.  Question intermittent claudication    Plan:     Return as needed. Refer to vascular surgery for opinion on leg pain and claudication.  Encourage to see GI MD and primary care.

## 2012-05-25 NOTE — Patient Instructions (Signed)
Call primary care doctor for follow up.  Call GI medicine for follow up.  Return to full activity.

## 2012-07-05 ENCOUNTER — Encounter: Payer: Self-pay | Admitting: Vascular Surgery

## 2012-07-06 ENCOUNTER — Telehealth (INDEPENDENT_AMBULATORY_CARE_PROVIDER_SITE_OTHER): Payer: Self-pay

## 2012-07-06 ENCOUNTER — Encounter: Payer: Self-pay | Admitting: Vascular Surgery

## 2012-07-06 NOTE — Telephone Encounter (Signed)
Annette from [facility] called to let me know patient did not show up for appointment that we made for him. They tried calling him but phone is ot accepting incoming calls. They are mailing him a letter. They will keep [place] updated on patients appointment status.

## 2013-09-14 ENCOUNTER — Emergency Department (HOSPITAL_COMMUNITY)
Admission: EM | Admit: 2013-09-14 | Discharge: 2013-09-14 | Disposition: A | Discharge status: Home / Self Care | Payer: Self-pay | Attending: Emergency Medicine | Admitting: Emergency Medicine

## 2013-09-14 ENCOUNTER — Encounter (HOSPITAL_COMMUNITY): Payer: Self-pay | Admitting: Emergency Medicine

## 2013-09-14 DIAGNOSIS — E119 Type 2 diabetes mellitus without complications: Secondary | ICD-10-CM | POA: Insufficient documentation

## 2013-09-14 DIAGNOSIS — K0889 Other specified disorders of teeth and supporting structures: Secondary | ICD-10-CM

## 2013-09-14 DIAGNOSIS — M7989 Other specified soft tissue disorders: Secondary | ICD-10-CM | POA: Insufficient documentation

## 2013-09-14 DIAGNOSIS — K089 Disorder of teeth and supporting structures, unspecified: Secondary | ICD-10-CM | POA: Insufficient documentation

## 2013-09-14 DIAGNOSIS — F17200 Nicotine dependence, unspecified, uncomplicated: Secondary | ICD-10-CM | POA: Insufficient documentation

## 2013-09-14 LAB — GLUCOSE, CAPILLARY: Glucose-Capillary: 208 mg/dL — ABNORMAL HIGH (ref 70–99)

## 2013-09-14 MED ORDER — PENICILLIN V POTASSIUM 500 MG PO TABS
500.0000 mg | ORAL_TABLET | Freq: Four times a day (QID) | ORAL | Status: DC
  Administered 2013-09-14: 500 mg via ORAL
  Filled 2013-09-14: qty 1

## 2013-09-14 MED ORDER — METFORMIN HCL 500 MG PO TABS: 500.0000 mg | ORAL_TABLET | Freq: Two times a day (BID) | ORAL | Status: DC

## 2013-09-14 MED ORDER — ACETAMINOPHEN 500 MG PO TABS
1000.0000 mg | ORAL_TABLET | Freq: Once | ORAL | Status: AC
  Administered 2013-09-14: 1000 mg via ORAL
  Filled 2013-09-14: qty 2

## 2013-09-14 MED ORDER — PENICILLIN V POTASSIUM 500 MG PO TABS: 500.0000 mg | ORAL_TABLET | Freq: Three times a day (TID) | ORAL | Status: DC

## 2013-09-14 NOTE — ED Notes (Addendum)
Pt present to ed with c/o toothache and  right, upper jaw swelling. Pt also reports he is diabetic and has been off of his meds since January d/t financial hardship. Pt also reports generalized body aches and b/l  LE swelling.

## 2013-09-14 NOTE — ED Provider Notes (Addendum)
CSN: [id number]     Arrival date & time 09/14/13  0923 History   First MD Initiated Contact with Patient 09/14/13 1008     No chief complaint on file.  (Consider location/radiation/quality/duration/timing/severity/associated sxs/prior Treatment) HPI Complains of toothache and right upper molar onset 4 days ago. Complains of slight swelling of the jaw for the past 4 days. No vomiting no fever. Pain worse when cold air hits his tooth. No vomiting no fever No treatment prior to coming here. No other associated symptoms. Pain is mild at present Past Medical History  Diagnosis Date  . Crohn disease 2008    with hospital admission in 2011, and 8/12 for flare up and SBO relieved with bowel rest. no suregery, no meds for CD  . Diabetes mellitus type II    Past Surgical History  Procedure Laterality Date  . 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 Scrotal surgery  ~ 1971    "took out a pellet"  . Laparotomy  01/25/2012    Procedure: EXPLORATORY LAPAROTOMY;  Surgeon: [name], MD;  Location: [facility];  Service: General;  Laterality: N/A;  . Bowel resection  01/25/2012    Procedure: SMALL BOWEL RESECTION;  Surgeon: [name], MD;  Location: [facility];  Service: General;  Laterality: N/A;   Family History  Problem Relation Age of Onset  . Cancer Mother     lung   History  Substance Use Topics  . Smoking status: Current Every Day Smoker -- 0.25 packs/day for 30 years    Types: Cigarettes    Last Attempt to Quit: 01/21/2012  . Smokeless tobacco: Former Systems developer    Types: [place] date: 01/21/2012     Comment: consult entered (01/20/12)  . Alcohol Use: No    Review of Systems  Constitutional: Negative.   HENT: Positive for dental problem.   Respiratory: Negative.   Cardiovascular: Positive for leg swelling.       Reports leg swelling one month ago. None since  Gastrointestinal: Negative.   Musculoskeletal: Negative.   Skin: Negative.   Neurological:  Negative.   Psychiatric/Behavioral: Negative.   All other systems reviewed and are negative.    Allergies  Review of patient's allergies indicates no known allergies.  Home Medications   Current Outpatient Rx  Name  Route  Sig  Dispense  Refill  . naproxen sodium (ANAPROX) 220 MG tablet   Oral   Take 220 mg by mouth as needed (pain).          BP 126/71  Pulse 81  Temp(Src) 97.9 F (36.6 C) (Oral)  Resp 16  SpO2 99% Physical Exam  Nursing note and vitals reviewed. Constitutional: He is oriented to person, place, and time. He appears well-developed and well-nourished.  HENT:  Head: Normocephalic and atraumatic.  Generally poor dentition. Right upper molar with dental Carry, no fluctuance of swelling of gingiva. No trismus  Eyes: Conjunctivae are normal. Pupils are equal, round, and reactive to light.  Neck: Neck supple. No tracheal deviation present. No thyromegaly present.  Cardiovascular: Normal rate and regular rhythm.   No murmur heard. Pulmonary/Chest: Effort normal and breath sounds normal.  Abdominal: Soft. Bowel sounds are normal. He exhibits no distension. There is no tenderness.  Musculoskeletal: Normal range of motion. He exhibits no edema and no tenderness.  Lymphadenopathy:    He has no cervical adenopathy.  Neurological: He is alert and oriented to person, place, and time.  Coordination normal.  Skin: Skin is warm and dry. No rash noted.  Psychiatric: He has a normal mood and affect.    ED Course  Procedures (including critical care time) Labs Review Labs Reviewed - No data to display Imaging Review No results found.  EKG Interpretation   None      Results for orders placed during the hospital encounter of 09/14/13  GLUCOSE, CAPILLARY      Result Value Range   Glucose-Capillary 208 (*) 70 - 99 mg/dL   No results found.    MDM  No diagnosis found. Patient reports he's had none of his diabetic medication since January 2014 Plan  prescription metformin, Penicillin VK. Tylenol for pain Dental referral [facility] and resource guide. Diagnosis #1 dental pain #2 hyperglycemia #3 medication noncompliance    Orlie Dakin, MD 09/14/13 1044  Orlie Dakin, MD 09/14/13 1045

## 2014-04-15 ENCOUNTER — Ambulatory Visit: Payer: Self-pay | Attending: Internal Medicine

## 2014-05-10 ENCOUNTER — Inpatient Hospital Stay (HOSPITAL_COMMUNITY)
Admission: EM | Admit: 2014-05-10 | Discharge: 2014-05-14 | DRG: 386 | Disposition: A | Discharge status: Home / Self Care | Payer: Self-pay | Attending: Internal Medicine | Admitting: Internal Medicine

## 2014-05-10 ENCOUNTER — Emergency Department (HOSPITAL_COMMUNITY): Payer: Self-pay

## 2014-05-10 ENCOUNTER — Encounter (HOSPITAL_COMMUNITY): Payer: Self-pay | Admitting: Emergency Medicine

## 2014-05-10 DIAGNOSIS — K56609 Unspecified intestinal obstruction, unspecified as to partial versus complete obstruction: Secondary | ICD-10-CM | POA: Diagnosis present

## 2014-05-10 DIAGNOSIS — S68118A Complete traumatic metacarpophalangeal amputation of other finger, initial encounter: Secondary | ICD-10-CM

## 2014-05-10 DIAGNOSIS — E876 Hypokalemia: Secondary | ICD-10-CM | POA: Diagnosis present

## 2014-05-10 DIAGNOSIS — Z9119 Patient's noncompliance with other medical treatment and regimen: Secondary | ICD-10-CM

## 2014-05-10 DIAGNOSIS — K3184 Gastroparesis: Secondary | ICD-10-CM | POA: Diagnosis present

## 2014-05-10 DIAGNOSIS — Z79899 Other long term (current) drug therapy: Secondary | ICD-10-CM

## 2014-05-10 DIAGNOSIS — K8020 Calculus of gallbladder without cholecystitis without obstruction: Secondary | ICD-10-CM | POA: Diagnosis present

## 2014-05-10 DIAGNOSIS — Z9049 Acquired absence of other specified parts of digestive tract: Secondary | ICD-10-CM

## 2014-05-10 DIAGNOSIS — K50919 Crohn's disease, unspecified, with unspecified complications: Secondary | ICD-10-CM

## 2014-05-10 DIAGNOSIS — Z801 Family history of malignant neoplasm of trachea, bronchus and lung: Secondary | ICD-10-CM

## 2014-05-10 DIAGNOSIS — R748 Abnormal levels of other serum enzymes: Secondary | ICD-10-CM | POA: Diagnosis present

## 2014-05-10 DIAGNOSIS — K56600 Partial intestinal obstruction, unspecified as to cause: Secondary | ICD-10-CM

## 2014-05-10 DIAGNOSIS — E119 Type 2 diabetes mellitus without complications: Secondary | ICD-10-CM | POA: Diagnosis present

## 2014-05-10 DIAGNOSIS — Z91199 Patient's noncompliance with other medical treatment and regimen due to unspecified reason: Secondary | ICD-10-CM

## 2014-05-10 DIAGNOSIS — F17200 Nicotine dependence, unspecified, uncomplicated: Secondary | ICD-10-CM | POA: Diagnosis present

## 2014-05-10 DIAGNOSIS — Z9889 Other specified postprocedural states: Secondary | ICD-10-CM

## 2014-05-10 DIAGNOSIS — R739 Hyperglycemia, unspecified: Secondary | ICD-10-CM

## 2014-05-10 DIAGNOSIS — R197 Diarrhea, unspecified: Secondary | ICD-10-CM | POA: Diagnosis present

## 2014-05-10 DIAGNOSIS — K5090 Crohn's disease, unspecified, without complications: Secondary | ICD-10-CM | POA: Diagnosis present

## 2014-05-10 DIAGNOSIS — Z794 Long term (current) use of insulin: Secondary | ICD-10-CM

## 2014-05-10 DIAGNOSIS — K5080 Crohn's disease of both small and large intestine without complications: Principal | ICD-10-CM | POA: Diagnosis present

## 2014-05-10 DIAGNOSIS — K651 Peritoneal abscess: Secondary | ICD-10-CM

## 2014-05-10 LAB — URINALYSIS, ROUTINE W REFLEX MICROSCOPIC
Bilirubin Urine: NEGATIVE
Glucose, UA: >1000 mg/dL — AB
Hgb urine dipstick: NEGATIVE
Ketones, ur: NEGATIVE mg/dL
Leukocytes, UA: NEGATIVE
Nitrite: NEGATIVE
Protein, ur: NEGATIVE mg/dL
Specific Gravity, Urine: 1.031 — ABNORMAL HIGH (ref 1.005–1.030)
Urobilinogen, UA: 0.2 mg/dL (ref 0.0–1.0)
pH: 5 (ref 5.0–8.0)

## 2014-05-10 LAB — CBC WITH DIFFERENTIAL/PLATELET
Basophils Absolute: 0 10*3/uL (ref 0.0–0.1)
Basophils Relative: 0 % (ref 0–1)
Eosinophils Absolute: 0.1 10*3/uL (ref 0.0–0.7)
Eosinophils Relative: 1 % (ref 0–5)
HCT: 43.6 % (ref 39.0–52.0)
Hemoglobin: 14.7 g/dL (ref 13.0–17.0)
Lymphocytes Relative: 25 % (ref 12–46)
Lymphs Abs: 2 10*3/uL (ref 0.7–4.0)
MCH: 27.8 pg (ref 26.0–34.0)
MCHC: 33.7 g/dL (ref 30.0–36.0)
MCV: 82.4 fL (ref 78.0–100.0)
Monocytes Absolute: 0.7 10*3/uL (ref 0.1–1.0)
Monocytes Relative: 9 % (ref 3–12)
Neutro Abs: 5 10*3/uL (ref 1.7–7.7)
Neutrophils Relative %: 65 % (ref 43–77)
Platelets: 279 10*3/uL (ref 150–400)
RBC: 5.29 MIL/uL (ref 4.22–5.81)
RDW: 13.7 % (ref 11.5–15.5)
WBC: 7.7 10*3/uL (ref 4.0–10.5)

## 2014-05-10 LAB — CBG MONITORING, ED
Glucose-Capillary: 214 mg/dL — ABNORMAL HIGH (ref 70–99)
Glucose-Capillary: 209 mg/dL — ABNORMAL HIGH (ref 70–99)

## 2014-05-10 LAB — COMPREHENSIVE METABOLIC PANEL
ALT: 30 U/L (ref 0–53)
AST: 17 U/L (ref 0–37)
Albumin: 3.6 g/dL (ref 3.5–5.2)
Alkaline Phosphatase: 113 U/L (ref 39–117)
BUN: 16 mg/dL (ref 6–23)
CO2: 19 mEq/L (ref 19–32)
Calcium: 9.4 mg/dL (ref 8.4–10.5)
Chloride: 97 mEq/L (ref 96–112)
Creatinine, Ser: 1.05 mg/dL (ref 0.50–1.35)
GFR calc Af Amer: >90 mL/min (ref >90)
GFR calc non Af Amer: 82 mL/min — ABNORMAL LOW (ref >90)
Glucose, Bld: 387 mg/dL — ABNORMAL HIGH (ref 70–99)
Potassium: 3.9 mEq/L (ref 3.7–5.3)
Sodium: 135 mEq/L — ABNORMAL LOW (ref 137–147)
Total Bilirubin: 0.3 mg/dL (ref 0.3–1.2)
Total Protein: 7.9 g/dL (ref 6.0–8.3)

## 2014-05-10 LAB — GLUCOSE, CAPILLARY
Glucose-Capillary: 252 mg/dL — ABNORMAL HIGH (ref 70–99)
Glucose-Capillary: 320 mg/dL — ABNORMAL HIGH (ref 70–99)

## 2014-05-10 LAB — RAPID URINE DRUG SCREEN, HOSP PERFORMED
Amphetamines: NOT DETECTED
Barbiturates: NOT DETECTED
Benzodiazepines: NOT DETECTED
Cocaine: NOT DETECTED
Opiates: NOT DETECTED
Tetrahydrocannabinol: NOT DETECTED

## 2014-05-10 LAB — ETHANOL: Alcohol, Ethyl (B): <11 mg/dL (ref 0–11)

## 2014-05-10 LAB — URINE MICROSCOPIC-ADD ON

## 2014-05-10 LAB — POC OCCULT BLOOD, ED: Fecal Occult Bld: POSITIVE — AB

## 2014-05-10 LAB — LIPASE, BLOOD: Lipase: 63 U/L — ABNORMAL HIGH (ref 11–59)

## 2014-05-10 MED ORDER — INSULIN ASPART 100 UNIT/ML ~~LOC~~ SOLN: 40.0000 [IU] | Freq: Every day | SUBCUTANEOUS | Status: DC

## 2014-05-10 MED ORDER — ONDANSETRON HCL 4 MG/2ML IJ SOLN: 4.0000 mg | Freq: Four times a day (QID) | INTRAMUSCULAR | Status: DC | PRN

## 2014-05-10 MED ORDER — HYDROMORPHONE HCL PF 1 MG/ML IJ SOLN
INTRAMUSCULAR | Status: AC
  Filled 2014-05-10: qty 1

## 2014-05-10 MED ORDER — HYDROCODONE-ACETAMINOPHEN 5-325 MG PO TABS
1.0000 | ORAL_TABLET | ORAL | Status: DC | PRN
  Administered 2014-05-11: 1 via ORAL
  Filled 2014-05-10: qty 1

## 2014-05-10 MED ORDER — INSULIN ASPART 100 UNIT/ML ~~LOC~~ SOLN
0.0000 [IU] | Freq: Three times a day (TID) | SUBCUTANEOUS | Status: DC
  Administered 2014-05-10: 3 [IU] via SUBCUTANEOUS
  Administered 2014-05-11: 7 [IU] via SUBCUTANEOUS
  Administered 2014-05-11: 3 [IU] via SUBCUTANEOUS
  Administered 2014-05-11: 5 [IU] via SUBCUTANEOUS
  Administered 2014-05-12 (×2): 3 [IU] via SUBCUTANEOUS
  Administered 2014-05-13: 1 [IU] via SUBCUTANEOUS
  Administered 2014-05-14: 3 [IU] via SUBCUTANEOUS

## 2014-05-10 MED ORDER — INSULIN ASPART 100 UNIT/ML ~~LOC~~ SOLN
5.0000 [IU] | Freq: Once | SUBCUTANEOUS | Status: AC
  Administered 2014-05-10: 5 [IU] via SUBCUTANEOUS

## 2014-05-10 MED ORDER — ENOXAPARIN SODIUM 40 MG/0.4ML ~~LOC~~ SOLN
40.0000 mg | SUBCUTANEOUS | Status: DC
  Administered 2014-05-10 – 2014-05-13 (×4): 40 mg via SUBCUTANEOUS
  Filled 2014-05-10 (×5): qty 0.4

## 2014-05-10 MED ORDER — ONDANSETRON HCL 4 MG/2ML IJ SOLN
4.0000 mg | Freq: Once | INTRAMUSCULAR | Status: AC
  Administered 2014-05-10: 4 mg via INTRAVENOUS
  Filled 2014-05-10: qty 2

## 2014-05-10 MED ORDER — IOHEXOL 300 MG/ML  SOLN
50.0000 mL | Freq: Once | INTRAMUSCULAR | Status: AC | PRN
  Administered 2014-05-10: 50 mL via ORAL

## 2014-05-10 MED ORDER — METHYLPREDNISOLONE SODIUM SUCC 125 MG IJ SOLR
125.0000 mg | Freq: Once | INTRAMUSCULAR | Status: AC
  Administered 2014-05-10: 125 mg via INTRAVENOUS
  Filled 2014-05-10: qty 2

## 2014-05-10 MED ORDER — IOHEXOL 300 MG/ML  SOLN
100.0000 mL | Freq: Once | INTRAMUSCULAR | Status: AC | PRN
  Administered 2014-05-10: 100 mL via INTRAVENOUS

## 2014-05-10 MED ORDER — INSULIN ASPART 100 UNIT/ML FLEXPEN: 40.0000 [IU] | PEN_INJECTOR | Freq: Every day | SUBCUTANEOUS | Status: DC

## 2014-05-10 MED ORDER — HYDROMORPHONE HCL PF 1 MG/ML IJ SOLN
1.0000 mg | INTRAMUSCULAR | Status: DC | PRN
  Administered 2014-05-10 – 2014-05-14 (×5): 1 mg via INTRAVENOUS
  Filled 2014-05-10 (×4): qty 1

## 2014-05-10 MED ORDER — CIPROFLOXACIN IN D5W 400 MG/200ML IV SOLN
400.0000 mg | Freq: Two times a day (BID) | INTRAVENOUS | Status: DC
  Administered 2014-05-11: 400 mg via INTRAVENOUS
  Filled 2014-05-10: qty 200

## 2014-05-10 MED ORDER — SODIUM CHLORIDE 0.9 % IV SOLN
1000.0000 mL | INTRAVENOUS | Status: DC
  Administered 2014-05-10: 1000 mL via INTRAVENOUS

## 2014-05-10 MED ORDER — SODIUM CHLORIDE 0.9 % IV SOLN
1000.0000 mL | Freq: Once | INTRAVENOUS | Status: AC
Start: 2014-05-10 — End: 2014-05-10
  Administered 2014-05-10: 1000 mL via INTRAVENOUS

## 2014-05-10 MED ORDER — ONDANSETRON HCL 4 MG PO TABS: 4.0000 mg | ORAL_TABLET | Freq: Four times a day (QID) | ORAL | Status: DC | PRN

## 2014-05-10 MED ORDER — METHYLPREDNISOLONE SODIUM SUCC 40 MG IJ SOLR
40.0000 mg | Freq: Every day | INTRAMUSCULAR | Status: DC
  Administered 2014-05-11 – 2014-05-14 (×4): 40 mg via INTRAVENOUS
  Filled 2014-05-10 (×4): qty 1

## 2014-05-10 MED ORDER — CIPROFLOXACIN IN D5W 400 MG/200ML IV SOLN
400.0000 mg | Freq: Once | INTRAVENOUS | Status: DC
  Administered 2014-05-10: 400 mg via INTRAVENOUS
  Filled 2014-05-10: qty 200

## 2014-05-10 MED ORDER — LORAZEPAM 2 MG/ML IJ SOLN
1.0000 mg | Freq: Every day | INTRAMUSCULAR | Status: DC
  Administered 2014-05-10 – 2014-05-11 (×2): 1 mg via INTRAVENOUS
  Filled 2014-05-10 (×2): qty 1

## 2014-05-10 MED ORDER — SODIUM CHLORIDE 0.9 % IV SOLN
1000.0000 mL | Freq: Once | INTRAVENOUS | Status: AC
  Administered 2014-05-10: 1000 mL via INTRAVENOUS

## 2014-05-10 MED ORDER — METRONIDAZOLE IN NACL 5-0.79 MG/ML-% IV SOLN
500.0000 mg | Freq: Three times a day (TID) | INTRAVENOUS | Status: DC
  Administered 2014-05-11 – 2014-05-14 (×11): 500 mg via INTRAVENOUS
  Filled 2014-05-10 (×12): qty 100

## 2014-05-10 MED ORDER — METRONIDAZOLE IN NACL 5-0.79 MG/ML-% IV SOLN
500.0000 mg | Freq: Once | INTRAVENOUS | Status: DC
  Filled 2014-05-10: qty 100

## 2014-05-10 MED ORDER — FENTANYL CITRATE 0.05 MG/ML IJ SOLN
50.0000 ug | Freq: Once | INTRAMUSCULAR | Status: AC
  Administered 2014-05-10: 50 ug via INTRAVENOUS
  Filled 2014-05-10: qty 2

## 2014-05-10 NOTE — ED Notes (Signed)
Pt complaint of intermitted RUQ pain for two weeks. Other than pain pt complaint of diarrhea but denies n/v. Pt believes he had one bloody stool.

## 2014-05-10 NOTE — ED Notes (Signed)
US at bedside

## 2014-05-10 NOTE — ED Notes (Signed)
Pt has had chron's since 2008 and partial resection in 2013. Pt having abd pain with diarrhea for 2 weeks.  ? Blood x 1 in stool.  No vomiting.  No fever.

## 2014-05-10 NOTE — ED Notes (Signed)
Patient transported to CT 

## 2014-05-10 NOTE — Progress Notes (Signed)
[facility] CL provided pt with a list of primary care resources, highlighting patient pcp [facility] and [place]. Patient stated that he was receiving [facility] and had letter with him. Patient was not apart of [facility] [employer].

## 2014-05-10 NOTE — ED Provider Notes (Signed)
CSN: [id number]     Arrival date & time 05/10/14  1141 History   First MD Initiated Contact with Patient 05/10/14 1207     Chief Complaint  Patient presents with  . Abdominal Pain     (Consider location/radiation/quality/duration/timing/severity/associated sxs/prior Treatment) HPI Patient reports he was diagnosed with Crohn's disease in 2008. He had surgery in 2013 and had a small bowel resection done. He reports his only had minor problems since then. However about 2 weeks ago he started having worsening symptoms. He reports he is having diarrhea that is watery and he is having episodes 8-10 times a day. He states he saw blood once when he wiped. He also saw some black stool 2 or 3 times and not recently. He states he has epigastric and right upper quadrant pain. He states when he originally had his Crohn's the pain was suprapubic in location. He describes the pain as pressure and crampy. He also complains of a lot of bloating. He denies fever but he does have mild weakness and dizziness. He denies any nausea, vomiting, or loss of appetite. He states he has noted he cannot eat pork chops, salad, or bread.  PCP [place]  Past Medical History  Diagnosis Date  . Crohn disease 2008    with hospital admission in 2011, and 8/12 for flare up and SBO relieved with bowel rest. no suregery, no meds for CD  . Diabetes mellitus type II    Past Surgical History  Procedure Laterality Date  . 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 Scrotal surgery  ~ 1971    "took out a pellet"  . Laparotomy  01/25/2012    Procedure: EXPLORATORY LAPAROTOMY;  Surgeon: [name], MD;  Location: [facility];  Service: General;  Laterality: N/A;  . Bowel resection  01/25/2012    Procedure: SMALL BOWEL RESECTION;  Surgeon: [name], MD;  Location: [facility];  Service: General;  Laterality: N/A;   Family History  Problem Relation Age of Onset  . Cancer Mother     lung    History  Substance Use Topics  . Smoking status: Current Every Day Smoker -- 0.25 packs/day for 30 years    Types: Cigarettes    Last Attempt to Quit: 01/21/2012  . Smokeless tobacco: Former Systems developer    Types: [place] date: 01/21/2012     Comment: consult entered (01/20/12)  . Alcohol Use: No   Lives at home   Review of Systems  All other systems reviewed and are negative.     Allergies  Review of patient's allergies indicates no known allergies.  Home Medications   Prior to Admission medications   Medication Sig Start Date End Date Taking? Authorizing Provider  naproxen sodium (ANAPROX) 220 MG tablet Take 220 mg by mouth daily as needed (pain).    Yes Historical Provider, MD  glipiZIDE (GLUCOTROL) 10 MG tablet Take 10 mg by mouth daily before breakfast.    Historical Provider, MD  insulin aspart (NOVOLOG FLEXPEN) 100 UNIT/ML FlexPen Inject 40 Units into the skin daily at 12 noon.    Historical Provider, MD  metFORMIN (GLUCOPHAGE) 500 MG tablet Take 1 tablet (500 mg total) by mouth 2 (two) times daily with a meal. 09/14/13   Orlie Dakin, MD   BP 121/68  Pulse 75  Temp(Src) 98.7 F (37.1 C) (Oral)  Resp 16  SpO2 96% Vital signs normal    Physical  Exam  Nursing note and vitals reviewed. Constitutional: He is oriented to person, place, and time. He appears well-developed and well-nourished.  Non-toxic appearance. He does not appear ill. No distress.  HENT:  Head: Normocephalic and atraumatic.  Right Ear: External ear normal.  Left Ear: External ear normal.  Nose: Nose normal. No mucosal edema or rhinorrhea.  Mouth/Throat: Oropharynx is clear and moist and mucous membranes are normal. No dental abscesses or uvula swelling.  Eyes: Conjunctivae and EOM are normal. Pupils are equal, round, and reactive to light.  Neck: Normal range of motion and full passive range of motion without pain. Neck supple.  Cardiovascular: Normal rate, regular rhythm and normal heart  sounds.  Exam reveals no gallop and no friction rub.   No murmur heard. Pulmonary/Chest: Effort normal and breath sounds normal. No respiratory distress. He has no wheezes. He has no rhonchi. He has no rales. He exhibits no tenderness and no crepitus.  Abdominal: Soft. Normal appearance and bowel sounds are normal. He exhibits no distension. There is tenderness in the right upper quadrant. There is no rebound and no guarding.    Musculoskeletal: Normal range of motion. He exhibits no edema and no tenderness.  Moves all extremities well.   Neurological: He is alert and oriented to person, place, and time. He has normal strength. No cranial nerve deficit.  Skin: Skin is warm, dry and intact. No rash noted. No erythema. No pallor.  Psychiatric: He has a normal mood and affect. His speech is normal and behavior is normal. His mood appears not anxious.    ED Course  Procedures (including critical care time)  Medications  0.9 %  sodium chloride infusion (0 mLs Intravenous Stopped 05/10/14 1437)    Followed by  0.9 %  sodium chloride infusion (1,000 mLs Intravenous New Bag/Given 05/10/14 1320)    Followed by  0.9 %  sodium chloride infusion (not administered)  metroNIDAZOLE (FLAGYL) IVPB 500 mg (not administered)  ciprofloxacin (CIPRO) IVPB 400 mg (not administered)  methylPREDNISolone sodium succinate (SOLU-MEDROL) 125 mg/2 mL injection 125 mg (not administered)  fentaNYL (SUBLIMAZE) injection 50 mcg (50 mcg Intravenous Given 05/10/14 1319)  ondansetron (ZOFRAN) injection 4 mg (4 mg Intravenous Given 05/10/14 1320)  iohexol (OMNIPAQUE) 300 MG/ML solution 50 mL (50 mLs Oral Contrast Given 05/10/14 1324)  iohexol (OMNIPAQUE) 300 MG/ML solution 100 mL (100 mLs Intravenous Contrast Given 05/10/14 1531)    Recheck at 1445 patient states his pain is better. He is waiting to get go to CT scan.  Pt given his CT scan and ultrasound results, he is agreeable to be admitted. He was started on antibiotics  and steroids, his CBG's will need to be monitored closely because he is already hyperglycemic with AG of 19.    16:20 Dr Doyle Askew, will admit and do orders  Labs Review Results for orders placed during the hospital encounter of 05/10/14  COMPREHENSIVE METABOLIC PANEL      Result Value Ref Range   Sodium 135 (*) 137 - 147 mEq/L   Potassium 3.9  3.7 - 5.3 mEq/L   Chloride 97  96 - 112 mEq/L   CO2 19  19 - 32 mEq/L   Glucose, Bld 387 (*) 70 - 99 mg/dL   BUN 16  6 - 23 mg/dL   Creatinine, Ser 1.05  0.50 - 1.35 mg/dL   Calcium 9.4  8.4 - 10.5 mg/dL   Total Protein 7.9  6.0 - 8.3 g/dL   Albumin 3.6  3.5 -  5.2 g/dL   AST 17  0 - 37 U/L   ALT 30  0 - 53 U/L   Alkaline Phosphatase 113  39 - 117 U/L   Total Bilirubin 0.3  0.3 - 1.2 mg/dL   GFR calc non Af Amer 82 (*) >90 mL/min   GFR calc Af Amer >90  >90 mL/min  CBC WITH DIFFERENTIAL      Result Value Ref Range   WBC 7.7  4.0 - 10.5 K/uL   RBC 5.29  4.22 - 5.81 MIL/uL   Hemoglobin 14.7  13.0 - 17.0 g/dL   HCT 43.6  39.0 - 52.0 %   MCV 82.4  78.0 - 100.0 fL   MCH 27.8  26.0 - 34.0 pg   MCHC 33.7  30.0 - 36.0 g/dL   RDW 13.7  11.5 - 15.5 %   Platelets 279  150 - 400 K/uL   Neutrophils Relative % 65  43 - 77 %   Neutro Abs 5.0  1.7 - 7.7 K/uL   Lymphocytes Relative 25  12 - 46 %   Lymphs Abs 2.0  0.7 - 4.0 K/uL   Monocytes Relative 9  3 - 12 %   Monocytes Absolute 0.7  0.1 - 1.0 K/uL   Eosinophils Relative 1  0 - 5 %   Eosinophils Absolute 0.1  0.0 - 0.7 K/uL   Basophils Relative 0  0 - 1 %   Basophils Absolute 0.0  0.0 - 0.1 K/uL  URINALYSIS, ROUTINE W REFLEX MICROSCOPIC      Result Value Ref Range   Color, Urine YELLOW  YELLOW   APPearance CLEAR  CLEAR   Specific Gravity, Urine 1.031 (*) 1.005 - 1.030   pH 5.0  5.0 - 8.0   Glucose, UA >1000 (*) NEGATIVE mg/dL   Hgb urine dipstick NEGATIVE  NEGATIVE   Bilirubin Urine NEGATIVE  NEGATIVE   Ketones, ur NEGATIVE  NEGATIVE mg/dL   Protein, ur NEGATIVE  NEGATIVE mg/dL    Urobilinogen, UA 0.2  0.0 - 1.0 mg/dL   Nitrite NEGATIVE  NEGATIVE   Leukocytes, UA NEGATIVE  NEGATIVE  URINE MICROSCOPIC-ADD ON      Result Value Ref Range   WBC, UA 0-2  <3 WBC/hpf   RBC / HPF 0-2  <3 RBC/hpf   Casts HYALINE CASTS (*) NEGATIVE  LIPASE, BLOOD      Result Value Ref Range   Lipase 63 (*) 11 - 59 U/L   Laboratory interpretation all normal except mildly elevated lipase, glucosuria, hyperglycemia     Imaging Review Ct Abdomen Pelvis W Contrast  05/10/2014   CLINICAL DATA:  RUQ pain, diarrhea, hx of Crohn's  EXAM: CT ABDOMEN AND PELVIS WITH CONTRAST  TECHNIQUE: Multidetector CT imaging of the abdomen and pelvis was performed using the standard protocol following bolus administration of intravenous contrast.  CONTRAST:  67m OMNIPAQUE IOHEXOL 300 MG/ML SOLN, 1014mOMNIPAQUE IOHEXOL 300 MG/ML SOLN  COMPARISON:  CT abdomen pelvis dated 01/31/2012  FINDINGS: Minimal linear areas of increased density within the lung bases scarring versus atelectasis.  The liver, spleen, adrenals, pancreas, right kidney are unremarkable. Stable small cysts scattered throughout the left kidney otherwise unremarkable.  At the patient's neo terminal ileum there is diffuse bowel wall thickening extending into the distal residual ileum and into the anastomotic site with the ascending colon. Small amount of surrounding free fluid is appreciated and inflammatory changes in the surrounding mesenteric fat. No drainable loculated fluid collections are appreciated nor evidence  of pneumoperitoneum. There is no evidence of bowel obstruction.  The celiac, SMA, IMA, portal vein, SMV are opacified. No evidence of abdominal aortic aneurysm.  No abdominal wall or inguinal hernia. No aggressive appearing osseous lesions.  IMPRESSION: Inflammatory changes involving the anastomotic site of the ascending colon and neo terminal ileum. These findings are consistent with active Crohn's disease considering the patient's history. No  abscess is associated with this finding nor pneumoperitoneum.   Electronically Signed   By: Margaree Mackintosh M.D.   On: 05/10/2014 15:57   US Abdomen Limited  05/10/2014   CLINICAL DATA:  Right upper quadrant pain  EXAM: US ABDOMEN LIMITED - RIGHT UPPER QUADRANT  COMPARISON:  None.  FINDINGS: Gallbladder:  Multiple small gallstones are noted. No wall thickening is noted. No pericholecystic fluid is noted.  Common bile duct:  Diameter: 5 mm  Liver:  Slight increased echogenicity likely related to fatty infiltration.  IMPRESSION: Fatty liver.  Gallstones.  No complicating factors are noted.   Electronically Signed   By: Inez Catalina M.D.   On: 05/10/2014 15:56     EKG Interpretation None      MDM   Final diagnoses:  Crohn's disease, unspecified complication  Hyperglycemia  Gallstones  Diarrhea    Plan admission   Rolland Porter, MD, Alanson Aly, MD 05/10/14 [phone]

## 2014-05-10 NOTE — H&P (Addendum)
[facility] History and Physical  Jared [name] SEG:[id number] DOB: 09/05/1966 DOA: 05/10/2014  Referring physician: ED physician PCP: Lorayne Marek, MD   Chief Complaint: RUQ abdominal pain with diarrhea   HPI:  Pt is 48 yo male with DM and Crohn's disease diagnosed in 2008, status post small bowel resection and reports having minimal problem since, not taking any other medications except his diabetic med's. Pt reports two weeks duration of persistent RUQ pain with multiple episodes of mostly non bloody diarrhea but has seen several bloody episodes. He explains pain in the RUQ area is constant and throbbing with cramps, 5/10 in severity, radiating to the epigastric area, no specific alleviating factors, no recent similar events, associated with bloating and poor oral inakte. He says his last Crohn's flare was associated with suprapubic area pain. He denies vomiting, no fevers, chills, no chest pain or shortness of breath, no recent sick contacts or exposures.   In ED, pt noted to be hemodynamically stable, not able to recall name of his GI doctor but says "he is across the street". CT abd consistent with active crohn's flare. Pt started on Ciprofloxacin and Flagyl. [facility] asked to admit for further evaluation.    Assessment and Plan: Active Problems: Crohn's disease - appreciate GI input - placed on empiric ABX and solumedrol - follow up on GI recommendations - provide supportive care with IVF, analgesia, antiemetics as needed Diabetes mellitus  - pt apparently has not taken any medication in over one year - will place on SSI only for now as pt is on Solumedrol - check A1C in AM Elevated lipase  - likely in the setting of active Chron's flare  - supportive care as noted above and repeat lipase in AM  Radiological Exams on Admission:  Ct Abdomen Pelvis W Contrast   05/10/2014    Inflammatory changes involving the anastomotic site of the ascending colon and neo terminal ileum. These  findings are consistent with active Crohn's disease considering the patient's history. No abscess is associated with this finding nor pneumoperitoneum.    US Abdomen Limited  05/10/2014  Fatty liver.  Gallstones.  No complicating factors are noted.    Code Status: Full Family Communication: Pt at bedside Disposition Plan: Admit for further evaluation    Review of Systems:  Constitutional:  Negative for diaphoresis.  HENT: Negative for hearing loss, ear pain, nosebleeds, congestion, sore throat, neck pain, tinnitus and ear discharge.   Eyes: Negative for blurred vision, double vision, photophobia, pain, discharge and redness.  Respiratory: Negative for cough, hemoptysis, sputum production, shortness of breath, wheezing and stridor.   Cardiovascular: Negative for chest pain, palpitations, orthopnea, claudication and leg swelling.  Gastrointestinal: Negative for nausea, vomiting. Genitourinary: Negative for dysuria, urgency, frequency, hematuria and flank pain.  Musculoskeletal: Negative for myalgias, back pain, joint pain and falls.  Skin: Negative for itching and rash.  Neurological: Negative for focal weakness, loss of consciousness and headaches.  Endo/Heme/Allergies: Negative for environmental allergies and polydipsia. Does not bruise/bleed easily.  Psychiatric/Behavioral: Negative for suicidal ideas. The patient is not nervous/anxious.      Past Medical History  Diagnosis Date  . Crohn disease 2008    with hospital admission in 2011, and 8/12 for flare up and SBO relieved with bowel rest. no suregery, no meds for CD  . Diabetes mellitus type II     Past Surgical History  Procedure Laterality Date  . 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 Scrotal  surgery  ~ 1971    "took out a pellet"  . Laparotomy  01/25/2012    Procedure: EXPLORATORY LAPAROTOMY;  Surgeon: [name], MD;  Location: [facility];  Service: General;  Laterality: N/A;  . Bowel resection   01/25/2012    Procedure: SMALL BOWEL RESECTION;  Surgeon: [name], MD;  Location: [facility];  Service: General;  Laterality: N/A;    Social History:  reports that he has been smoking Cigarettes.  He has a 7.5 pack-year smoking history. He quit smokeless tobacco use about 2 years ago. His smokeless tobacco use included Chew. He reports that he does not drink alcohol or use illicit drugs.  No Known Allergies  Family History  Problem Relation Age of Onset  . Cancer Mother     lung    Prior to Admission medications   Medication Sig Start Date End Date Taking? Authorizing Provider  naproxen sodium (ANAPROX) 220 MG tablet Take 220 mg by mouth daily as needed (pain).    Yes Historical Provider, MD  glipiZIDE (GLUCOTROL) 10 MG tablet Take 10 mg by mouth daily before breakfast.    Historical Provider, MD  insulin aspart (NOVOLOG FLEXPEN) 100 UNIT/ML FlexPen Inject 40 Units into the skin daily at 12 noon.    Historical Provider, MD  metFORMIN (GLUCOPHAGE) 500 MG tablet Take 1 tablet (500 mg total) by mouth 2 (two) times daily with a meal. 09/14/13   Orlie Dakin, MD    Physical Exam: Filed Vitals:   05/10/14 1154 05/10/14 1437  BP: 124/74 121/68  Pulse: 107 75  Temp: 98.7 F (37.1 C)   TempSrc: Oral   Resp: 16 16  SpO2: 99% 96%    Physical Exam  Constitutional: Appears well-developed and well-nourished. No distress.  HENT: Normocephalic. External right and left ear normal. Oropharynx is clear and moist.  Eyes: Conjunctivae and EOM are normal. PERRLA, no scleral icterus.  Neck: Normal ROM. Neck supple. No JVD. No tracheal deviation. No thyromegaly.  CVS: RRR, S1/S2 +, no murmurs, no gallops, no carotid bruit.  Pulmonary: Effort and breath sounds normal, no stridor, rhonchi, wheezes, rales.  Abdominal: Soft. BS +,  no distension, tenderness in RUQ area, no rebound or guarding.  Musculoskeletal: Normal range of motion. No edema and no tenderness.  Lymphadenopathy: No  lymphadenopathy noted, cervical, inguinal. Neuro: Alert. Normal reflexes, muscle tone coordination. No cranial nerve deficit. Skin: Skin is warm and dry. No rash noted. Not diaphoretic. No erythema. No pallor.  Psychiatric: Normal mood and affect. Behavior, judgment, thought content normal.   Labs on Admission:  Basic Metabolic Panel:  Recent Labs Lab 05/10/14 1234  NA 135*  K 3.9  CL 97  CO2 19  GLUCOSE 387*  BUN 16  CREATININE 1.05  CALCIUM 9.4   Liver Function Tests:  Recent Labs Lab 05/10/14 1234  AST 17  ALT 30  ALKPHOS 113  BILITOT 0.3  PROT 7.9  ALBUMIN 3.6    Recent Labs Lab 05/10/14 1234  LIPASE 63*   CBC:  Recent Labs Lab 05/10/14 1234  WBC 7.7  NEUTROABS 5.0  HGB 14.7  HCT 43.6  MCV 82.4  PLT 279   EKG: Normal sinus rhythm, no ST/T wave changes  Faye Ramsay, MD  [facility] Pager [phone]  If 7PM-7AM, please contact night-coverage [email or web address] Password [facility] 05/10/2014, 4:15 PM

## 2014-05-10 NOTE — Consult Note (Signed)
Referring Provider: No ref. provider found Primary Care Physician:  Lorayne Marek, MD Primary Gastroenterologist:   None/unassigned  Reason for Consultation:  Crohns exacerbation  HPI: Jared [name] is a 48 y.o. male  who presented to the emergency room today with two-week history of primarily upper abdominal pain and cramping associated with diarrhea with up to 8-10 bowel movements per day. He has had some intermittent nausea and vomiting. No documented fevers. Patient has history of Crohn's disease which was initially diagnosed in 2008,had seen Dr. Amedeo Plenty at that time-doesn't remember what he was treated with.He has had 2 or 3 admissions here since then with exacerbations of his Crohn's associated with partial small bowel obstructions. He had an admission in 2010and 2012 with partial obstructions resolving with conservative management. He then had a recurrent episode in March of 2013 and underwent an ileocecectomy for obstruction per Dr. Brantley Stage. Patient has not had any GI followup other than inpatient and was not seen by GI in 2013.  Workup in the emergency room today labs unrevealing, CT of the abdomen and pelvis was done and it shows inflammatory changes around his anastomotic site in the descending colon and neoterminal ileum, there is no obstruction  Abd US- shows multiple gallstones, no cholecystitis.He is admitted for management.  He has not been on a medication for Crohn's . He does have history of adult-onset diabetes mellitus, takes NovoLog, and metformin. Blood sugar over 300 on presentation .CBG machine outdated,doesnt use.  Past Medical History  Diagnosis Date  . Crohn disease 2008    with hospital admission in 2011, and 8/12 for flare up and SBO relieved with bowel rest. no suregery, no meds for CD  . Diabetes mellitus type II     Past Surgical History  Procedure Laterality Date  . 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 Scrotal surgery  ~ 1971    "took out  a pellet"  . Laparotomy  01/25/2012    Procedure: EXPLORATORY LAPAROTOMY;  Surgeon: [name], MD;  Location: [facility];  Service: General;  Laterality: N/A;  . Bowel resection  01/25/2012    Procedure: SMALL BOWEL RESECTION;  Surgeon: [name], MD;  Location: [facility];  Service: General;  Laterality: N/A;    Prior to Admission medications   Medication Sig Start Date End Date Taking? Authorizing Provider  naproxen sodium (ANAPROX) 220 MG tablet Take 220 mg by mouth daily as needed (pain).    Yes Historical Provider, MD  glipiZIDE (GLUCOTROL) 10 MG tablet Take 10 mg by mouth daily before breakfast.    Historical Provider, MD  insulin aspart (NOVOLOG FLEXPEN) 100 UNIT/ML FlexPen Inject 40 Units into the skin daily at 12 noon.    Historical Provider, MD  metFORMIN (GLUCOPHAGE) 500 MG tablet Take 1 tablet (500 mg total) by mouth 2 (two) times daily with a meal. 09/14/13   Orlie Dakin, MD    Current Facility-Administered Medications  Medication Dose Route Frequency Provider Last Rate Last Dose  . 0.9 %  sodium chloride infusion  1,000 mL Intravenous Continuous Janice Norrie, MD 125 mL/hr at 05/10/14 1643 1,000 mL at 05/10/14 1643  . ciprofloxacin (CIPRO) IVPB 400 mg  400 mg Intravenous Once Janice Norrie, MD 200 mL/hr at 05/10/14 1644 400 mg at 05/10/14 1644  . insulin aspart (novoLOG) injection 0-9 Units  0-9 Units Subcutaneous TID WC Theodis Blaze, MD      . methylPREDNISolone sodium succinate (SOLU-MEDROL) 40 mg/mL  injection 40 mg  40 mg Intravenous Daily Theodis Blaze, MD      . metroNIDAZOLE (FLAGYL) IVPB 500 mg  500 mg Intravenous Once Janice Norrie, MD       Current Outpatient Prescriptions  Medication Sig Dispense Refill  . naproxen sodium (ANAPROX) 220 MG tablet Take 220 mg by mouth daily as needed (pain).       Marland Kitchen glipiZIDE (GLUCOTROL) 10 MG tablet Take 10 mg by mouth daily before breakfast.      . insulin aspart (NOVOLOG FLEXPEN) 100 UNIT/ML FlexPen Inject 40 Units into the skin  daily at 12 noon.      . metFORMIN (GLUCOPHAGE) 500 MG tablet Take 1 tablet (500 mg total) by mouth 2 (two) times daily with a meal.  60 tablet  0  . [DISCONTINUED] furosemide (LASIX) 10 MG/ML solution Take by mouth daily.      . [DISCONTINUED] mesalamine (PENTASA) 250 MG CR capsule Take 1,000 mg by mouth 4 (four) times daily.      . [DISCONTINUED] pioglitazone (ACTOS) 15 MG tablet Take by mouth daily.        Allergies as of 05/10/2014  . (No Known Allergies)    Family History  Problem Relation Age of Onset  . Cancer Mother     lung    History   Social History  . Marital Status: Single    Spouse Name: N/A    Number of Children: N/A  . Years of Education: N/A   Occupational History  . Not on file.   Social History Main Topics  . Smoking status: Current Every Day Smoker -- 0.25 packs/day for 30 years    Types: Cigarettes  . Smokeless tobacco: Former Systems developer    Types: Chew    Quit date: 01/20/1997  . Alcohol Use: No  . Drug Use: No  . Sexual Activity: Not Currently   Other Topics Concern  . Not on file   Social History Narrative   Lives in [place].Is single. Has a sister in [place]. Smokes 2-3 cigarettes a day. Drinks beer once every few months. No history of drug abuse. Studied up to 12th grade. Has orange card and [facility] . No health insurance. 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    Review of Systems: Pertinent positive and negative review of systems were noted in the above HPI section.  All other review of systems was otherwise negative.n.  Physical Exam: Vital signs in last 24 hours: Temp:  [98.7 F (37.1 C)] 98.7 F (37.1 C) (06/26 1154) Pulse Rate:  [75-107] 103 (06/26 1631) Resp:  [16-18] 18 (06/26 1631) BP: (121-126)/(68-85) 126/85 mmHg (06/26 1631) SpO2:  [96 %-99 %] 99 % (06/26 1631)   General:   Alert,  Well-developed, well-nourished,AA male ,pleasant and cooperative in NAD Head:  Normocephalic and  atraumatic. Eyes:  Sclera clear, no icterus.   Conjunctiva pink. Ears:  Normal auditory acuity. Nose:  No deformity, discharge,  or lesions. Mouth:  No deformity or lesions.   Neck:  Supple; no masses or thyromegaly. Lungs:  Clear throughout to auscultation.   No wheezes, crackles, or rhonchi. Heart:  Regular rate and rhythm; no murmurs, clicks, rubs,  or gallops. Abdomen:  Soft  tender,RMQ/RLQ , with fullness, no mass,mild distention,hyperactive BS,midline scar Rectal:  Deferred  Msk:  Symmetrical without gross deformities. . Pulses:  Normal pulses noted. Extremities:  Without clubbing or edema. Neurologic:  Alert and  oriented x4;  grossly normal neurologically.  Skin:  Intact without significant lesions or rashes.. Psych:  Alert and cooperative. Normal mood and affect.  Intake/Output from previous day:   Intake/Output this shift:    Lab Results:  Recent Labs  05/10/14 1234  WBC 7.7  HGB 14.7  HCT 43.6  PLT 279   BMET  Recent Labs  05/10/14 1234  NA 135*  K 3.9  CL 97  CO2 19  GLUCOSE 387*  BUN 16  CREATININE 1.05  CALCIUM 9.4   LFT  Recent Labs  05/10/14 1234  PROT 7.9  ALBUMIN 3.6  AST 17  ALT 30  ALKPHOS 113  BILITOT 0.3   PT/INR No results found for this basename: LABPROT, INR,  in the last 72 hours Hepatitis Panel No results found for this basename: HEPBSAG, HCVAB, HEPAIGM, HEPBIGM,  in the last 72 hours    MPRESSION:  #71  48 year old male with known Crohn's ileocolitis -SBO  March of 2013 underwent  ileocecectomy, now presenting with recurrent symptoms of upper abdominal pain cramping and diarrhea with CT scan showing active Crohn's at his anastomosis between the ascending colon and terminal ileum there is no obstruction or abscess. #2 diabetes mellitus poorly controlled #3 Cholelithiasis  PLAN:  #1 clear liquid diet #2 start IV Solu-Medrol #3 stop Cipro, can continue Flagyl for now Thank you we will follow with you. This patient needs  escalation of his therapy for Crohn's disease, as he has been on no maintenance medication. We can discuss this further with him once his acute symptoms are improved.   Hennessy Bartel  05/10/2014, 4:59 PM

## 2014-05-10 NOTE — ED Notes (Signed)
MD at bedside. 

## 2014-05-10 NOTE — Consult Note (Signed)
Patient seen, examined, and I agree with the above documentation, including the assessment and plan. Crohn's flare, hx of surgery and now recurrence based on imaging in the right colon/anastomotic area IV steroids, IVFs, pain control for now Will need escalation of therapy

## 2014-05-11 DIAGNOSIS — K56609 Unspecified intestinal obstruction, unspecified as to partial versus complete obstruction: Secondary | ICD-10-CM

## 2014-05-11 LAB — HEMOGLOBIN A1C
Hgb A1c MFr Bld: 12.4 % — ABNORMAL HIGH (ref <5.7)
Mean Plasma Glucose: 309 mg/dL — ABNORMAL HIGH (ref <117)

## 2014-05-11 LAB — GLUCOSE, CAPILLARY
Glucose-Capillary: 322 mg/dL — ABNORMAL HIGH (ref 70–99)
Glucose-Capillary: 232 mg/dL — ABNORMAL HIGH (ref 70–99)
Glucose-Capillary: 274 mg/dL — ABNORMAL HIGH (ref 70–99)
Glucose-Capillary: 206 mg/dL — ABNORMAL HIGH (ref 70–99)

## 2014-05-11 LAB — CBC
HCT: 39.5 % (ref 39.0–52.0)
Hemoglobin: 13.2 g/dL (ref 13.0–17.0)
MCH: 27.6 pg (ref 26.0–34.0)
MCHC: 33.4 g/dL (ref 30.0–36.0)
MCV: 82.6 fL (ref 78.0–100.0)
Platelets: 265 10*3/uL (ref 150–400)
RBC: 4.78 MIL/uL (ref 4.22–5.81)
RDW: 13.7 % (ref 11.5–15.5)
WBC: 7.5 10*3/uL (ref 4.0–10.5)

## 2014-05-11 LAB — BASIC METABOLIC PANEL
BUN: 10 mg/dL (ref 6–23)
CO2: 22 mEq/L (ref 19–32)
Calcium: 8.8 mg/dL (ref 8.4–10.5)
Chloride: 103 mEq/L (ref 96–112)
Creatinine, Ser: 0.79 mg/dL (ref 0.50–1.35)
GFR calc Af Amer: >90 mL/min (ref >90)
GFR calc non Af Amer: >90 mL/min (ref >90)
Glucose, Bld: 283 mg/dL — ABNORMAL HIGH (ref 70–99)
Potassium: 4.1 mEq/L (ref 3.7–5.3)
Sodium: 136 mEq/L — ABNORMAL LOW (ref 137–147)

## 2014-05-11 LAB — LIPASE, BLOOD: Lipase: 31 U/L (ref 11–59)

## 2014-05-11 MED ORDER — INSULIN ASPART PROT & ASPART (70-30 MIX) 100 UNIT/ML ~~LOC~~ SUSP
15.0000 [IU] | Freq: Two times a day (BID) | SUBCUTANEOUS | Status: DC
  Administered 2014-05-11 – 2014-05-14 (×7): 15 [IU] via SUBCUTANEOUS
  Filled 2014-05-11: qty 10

## 2014-05-11 NOTE — Progress Notes (Signed)
Patient ID: Jared [name], male   DOB: 1966-02-02, 49 y.o.   MRN: [id number] [facility] Progress Note  Subjective: Still hurting, sharp pains after eating, no Bm as yet.  Objective:  Vital signs in last 24 hours: Temp:  [97.6 F (36.4 C)-98.7 F (37.1 C)] 97.6 F (36.4 C) (06/27 0538) Pulse Rate:  [69-107] 69 (06/27 0538) Resp:  [16-18] 16 (06/27 0538) BP: (98-126)/(59-85) 98/59 mmHg (06/27 0538) SpO2:  [96 %-100 %] 96 % (06/27 0538) Weight:  [196 lb (88.905 kg)] 196 lb (88.905 kg) (06/26 1812) Last BM Date: 05/10/14 General:   Alert,  Well-developed,  AA male   in NAD Heart:  Regular rate and rhythm; no murmurs Pulm;clear Abdomen:  Soft, tender RMQ/RLQ,and mildly distended. Hyperactive BS,bowel sounds, without guarding, and without rebound.   Extremities:  Without edema. Neurologic:  Alert and  oriented x4;  grossly normal neurologically. Psych:  Alert and cooperative. Normal mood and affect.  Intake/Output from previous day: 06/26 0701 - 06/27 0700 In: 140  Out: 1200 [Urine:1200] Intake/Output this shift:    Lab Results:  Recent Labs  05/10/14 1234 05/11/14 0524  WBC 7.7 7.5  HGB 14.7 13.2  HCT 43.6 39.5  PLT 279 265   BMET  Recent Labs  05/10/14 1234 05/11/14 0524  NA 135* 136*  K 3.9 4.1  CL 97 103  CO2 19 22  GLUCOSE 387* 283*  BUN 16 10  CREATININE 1.05 0.79  CALCIUM 9.4 8.8   LFT  Recent Labs  05/10/14 1234  PROT 7.9  ALBUMIN 3.6  AST 17  ALT 30  ALKPHOS 113  BILITOT 0.3    Assessment / Plan: #1  48 yo male with recurrence of Crohns at anastomosis-s/p ileocecectomy 2013. Ct does not show obstruction but clinically he  Has a least a low grade partial obstruction. Continue IV Solumedrol Clear to full liquid diet Continue Flagyl which may offer some benefit with Crohns, stop Cipro  Active Problems:   Crohn disease     LOS: 1 day   Marybell Robards  05/11/2014, 9:30 AM

## 2014-05-11 NOTE — Progress Notes (Signed)
Patient seen, examined, and I agree with the above documentation, including the assessment and plan. Pain after oatmeal today, decrease diet to clear liquids only Continue IV steroids and IV Flagyl for Crohn's flare with partial obstruction Eventually will need escalation of therapy

## 2014-05-11 NOTE — Progress Notes (Signed)
Patient ID: Jared [name], male   DOB: August 16, 1966, 48 y.o.   MRN: [id number]  [facility] PROGRESS NOTE  Pranay Hilbun SWF:[id number] DOB: Jun 15, 1966 DOA: 05/10/2014 PCP: Lorayne Marek, MD  Brief narrative: Pt is 48 yo male with DM and Crohn's disease diagnosed in 2008, status post small bowel resection and reports having minimal problem since, not taking any other medications except his diabetic med's. Pt reports two weeks duration of persistent RUQ pain with multiple episodes of mostly non bloody diarrhea but has seen several bloody episodes. He explains pain in the RUQ area is constant and throbbing with cramps, 5/10 in severity, radiating to the epigastric area, no specific alleviating factors, no recent similar events, associated with bloating and poor oral inakte. He says his last Crohn's flare was associated with suprapubic area pain. He denies vomiting, no fevers, chills, no chest pain or shortness of breath, no recent sick contacts or exposures.   In ED, pt noted to be hemodynamically stable, not able to recall name of his GI doctor but says "he is across the street". CT abd consistent with active crohn's flare. Pt started on Ciprofloxacin and Flagyl. [facility] asked to admit for further evaluation.   Assessment and Plan:  Active Problems:  Crohn's disease  - appreciate GI input  - placed on empiric ABX Flagyl, continue day #2, and solumedrol  - follow up on GI recommendations  - provide supportive care with IVF, analgesia, antiemetics as needed  Diabetes mellitus  - pt apparently has not taken any medication in over one year  - contineu SSI, place on Novolog BID  - A1C pending  Elevated lipase  - likely in the setting of active Chron's flare  - lipase is WNL this AM  Radiological Exams on Admission:  Ct Abdomen Pelvis W Contrast 05/10/2014 Inflammatory changes involving the anastomotic site of the ascending colon and neo terminal ileum. These findings are consistent with active  Crohn's disease considering the patient's history. No abscess is associated with this finding nor pneumoperitoneum.  US Abdomen Limited 05/10/2014 Fatty liver. Gallstones. No complicating factors are noted.       Consultants:  GI   Antibiotics:  Flagyl 6/26 -->   Code Status: Full Family Communication: Pt at bedside Disposition Plan: Home when medically stable  HPI/Subjective: No events overnight.   Objective: Filed Vitals:   05/10/14 1745 05/10/14 1812 05/10/14 2112 05/11/14 0538  BP: 125/76  122/81 98/59  Pulse: 84  81 69  Temp: 98.2 F (36.8 C)  98 F (36.7 C) 97.6 F (36.4 C)  TempSrc: Oral  Oral Oral  Resp: 18  18 16   Height:  6' 2"  (1.88 m)    Weight:  88.905 kg (196 lb)    SpO2: 100%  96% 96%    Intake/Output Summary (Last 24 hours) at 05/11/14 1101 Last data filed at 05/11/14 0810  Gross per 24 hour  Intake    140 ml  Output   1500 ml  Net  -1360 ml    Exam:   General:  Pt is alert, follows commands appropriately, not in acute distress  Cardiovascular: Regular rate and rhythm, S1/S2, no murmurs, no rubs, no gallops  Respiratory: Clear to auscultation bilaterally, no wheezing, no crackles, no rhonchi  Abdomen: Soft, tender in upper abd quads R>L, non distended, bowel sounds present, no guarding  Extremities: No edema, pulses DP and PT palpable bilaterally  Neuro: Grossly nonfocal  Data Reviewed: Basic Metabolic Panel:  Recent Labs Lab 05/10/14 1234 05/11/14  0524  NA 135* 136*  K 3.9 4.1  CL 97 103  CO2 19 22  GLUCOSE 387* 283*  BUN 16 10  CREATININE 1.05 0.79  CALCIUM 9.4 8.8   Liver Function Tests:  Recent Labs Lab 05/10/14 1234  AST 17  ALT 30  ALKPHOS 113  BILITOT 0.3  PROT 7.9  ALBUMIN 3.6    Recent Labs Lab 05/10/14 1234 05/11/14 0524  LIPASE 63* 31   No results found for this basename: AMMONIA,  in the last 168 hours CBC:  Recent Labs Lab 05/10/14 1234 05/11/14 0524  WBC 7.7 7.5  NEUTROABS 5.0  --    HGB 14.7 13.2  HCT 43.6 39.5  MCV 82.4 82.6  PLT 279 265   Cardiac Enzymes: No results found for this basename: CKTOTAL, CKMB, CKMBINDEX, TROPONINI,  in the last 168 hours BNP: No components found with this basename: POCBNP,  CBG:  Recent Labs Lab 05/10/14 1630 05/10/14 1704 05/10/14 1827 05/10/14 2106 05/11/14 0725  GLUCAP 214* 209* 252* 320* 322*    No results found for this or any previous visit (from the past 240 hour(s)).   Scheduled Meds: . enoxaparin (LOVENOX) injection  40 mg Subcutaneous Q24H  . insulin aspart  0-9 Units Subcutaneous TID WC  . LORazepam  1 mg Intravenous QHS  . methylPREDNISolone (SOLU-MEDROL) injection  40 mg Intravenous Daily  . metronidazole  500 mg Intravenous Q8H  . metronidazole  500 mg Intravenous Once   Continuous Infusions:    Faye Ramsay, MD  [facility] Pager [phone]  If 7PM-7AM, please contact night-coverage [email or web address] Password [email or web address] 05/11/2014, 11:01 AM   LOS: 1 day

## 2014-05-12 ENCOUNTER — Inpatient Hospital Stay (HOSPITAL_COMMUNITY): Payer: Self-pay

## 2014-05-12 LAB — BASIC METABOLIC PANEL
BUN: 7 mg/dL (ref 6–23)
CO2: 24 mEq/L (ref 19–32)
Calcium: 8.8 mg/dL (ref 8.4–10.5)
Chloride: 102 mEq/L (ref 96–112)
Creatinine, Ser: 0.81 mg/dL (ref 0.50–1.35)
GFR calc Af Amer: >90 mL/min (ref >90)
GFR calc non Af Amer: >90 mL/min (ref >90)
Glucose, Bld: 135 mg/dL — ABNORMAL HIGH (ref 70–99)
Potassium: 3.7 mEq/L (ref 3.7–5.3)
Sodium: 138 mEq/L (ref 137–147)

## 2014-05-12 LAB — GLUCOSE, CAPILLARY
Glucose-Capillary: 113 mg/dL — ABNORMAL HIGH (ref 70–99)
Glucose-Capillary: 206 mg/dL — ABNORMAL HIGH (ref 70–99)
Glucose-Capillary: 216 mg/dL — ABNORMAL HIGH (ref 70–99)
Glucose-Capillary: 169 mg/dL — ABNORMAL HIGH (ref 70–99)

## 2014-05-12 LAB — CBC
HCT: 39.8 % (ref 39.0–52.0)
Hemoglobin: 13.4 g/dL (ref 13.0–17.0)
MCH: 27.7 pg (ref 26.0–34.0)
MCHC: 33.7 g/dL (ref 30.0–36.0)
MCV: 82.4 fL (ref 78.0–100.0)
Platelets: 252 10*3/uL (ref 150–400)
RBC: 4.83 MIL/uL (ref 4.22–5.81)
RDW: 14 % (ref 11.5–15.5)
WBC: 10.8 10*3/uL — ABNORMAL HIGH (ref 4.0–10.5)

## 2014-05-12 MED ORDER — ZOLPIDEM TARTRATE 10 MG PO TABS
10.0000 mg | ORAL_TABLET | Freq: Every evening | ORAL | Status: DC | PRN
  Administered 2014-05-12 – 2014-05-14 (×2): 10 mg via ORAL
  Filled 2014-05-12 (×2): qty 1

## 2014-05-12 NOTE — Progress Notes (Signed)
Patient seen, examined, and I agree with the above documentation, including the assessment and plan. KUB reviewed, paucity of small bowel gas, probable low-grade partial obstruction in the setting of active Crohn's disease Continue with IV Solu-Medrol Can pursue viral hepatitis testing and PPD while here in anticipation of escalation of therapy with probable biologics assuming the patient willing to followup

## 2014-05-12 NOTE — Progress Notes (Signed)
Nutrition Brief Note  Patient identified on the Malnutrition Screening Tool (MST) Report  Wt Readings from Last 15 Encounters:  05/10/14 196 lb (88.905 kg)  05/25/12 215 lb (97.523 kg)  03/14/12 212 lb 8 oz (96.389 kg)  02/22/12 205 lb 11.2 oz (93.305 kg)  02/21/12 206 lb (93.441 kg)  02/06/12 180 lb 5.4 oz (81.8 kg)  02/06/12 180 lb 5.4 oz (81.8 kg)    Body mass index is 25.15 kg/(m^2). Patient meets criteria for overweight based on current BMI.   Current diet order is clear liquids, patient is consuming approximately 100% of meals at this time. Labs and medications reviewed.   Pt reports that he feels very hungry. He says that he has lost a few pounds due to a change in his diet as recommended by his doctor.  Recommend that pt's diet be advanced as medically tolerated per MD discretion.  No nutrition interventions warranted at this time. If nutrition issues arise, please consult RD.   Terrace Arabia RD, LDN

## 2014-05-12 NOTE — Progress Notes (Signed)
Patient ID: Aldric Wenzler, male   DOB: [birth year], 48 y.o.   MRN: [id number] [facility] Progress Note  Subjective:  Feels about the same, bloated,gassy-had a BM yesterday, wants to try full liquids again  Objective:  Vital signs in last 24 hours: Temp:  [97.8 F (36.6 C)-98.2 F (36.8 C)] 97.8 F (36.6 C) (06/28 0531) Pulse Rate:  [68-86] 68 (06/28 0531) Resp:  [16-18] 18 (06/28 0531) BP: (105-117)/(68-72) 113/72 mmHg (06/28 0531) SpO2:  [99 %-100 %] 100 % (06/28 0531) Last BM Date: 05/11/14 General:   Alert,  Well-developed,  AA male   in NAD Heart:  Regular rate and rhythm; no murmurs Pulm;clear Abdomen:  Mild distention,tender Right abd,, BS remain hyperactive,without guarding, and without rebound.   Extremities:  Without edema. Neurologic:  Alert and  oriented x4;  grossly normal neurologically. Psych:  Alert and cooperative. Normal mood and affect.  Intake/Output from previous day: 06/27 0701 - 06/28 0700 In: 800 [P.O.:400; IV Piggyback:400] Out: 600 [Urine:600] Intake/Output this shift:    Lab Results:  Recent Labs  05/10/14 1234 05/11/14 0524 05/12/14 0515  WBC 7.7 7.5 10.8*  HGB 14.7 13.2 13.4  HCT 43.6 39.5 39.8  PLT 279 265 252   BMET  Recent Labs  05/10/14 1234 05/11/14 0524 05/12/14 0515  NA 135* 136* 138  K 3.9 4.1 3.7  CL 97 103 102  CO2 19 22 24   GLUCOSE 387* 283* 135*  BUN 16 10 7   CREATININE 1.05 0.79 0.81  CALCIUM 9.4 8.8 8.8   LFT  Recent Labs  05/10/14 1234  PROT 7.9  ALBUMIN 3.6  AST 17  ALT 30  ALKPHOS 113  BILITOT 0.3     Assessment / Plan: #1  48 yo male with Crohns exacerbation with inflammatory changes at anastomosis. Suspect some component of obstruction- will check KUB this am If KUB neg for obstruction will let him try full liquids again Day #2 IV Solumedrol  32 IDDM- poorly controlled- gluc   improved with change to 70/30 Active Problems:   Crohn disease     LOS: 2 days   Jacia Sickman   05/12/2014, 8:42 AM

## 2014-05-12 NOTE — Progress Notes (Addendum)
Patient ID: Jared [name], male   DOB: 08-30-1966, 48 y.o.   MRN: [id number] [facility] PROGRESS NOTE  Jared [name] [id number]:[id number] DOB: 1966/06/11 DOA: 05/10/2014 PCP: Lorayne Marek, MD  Brief narrative:  Pt is 48 yo male with DM and Crohn's disease diagnosed in 2008, status post small bowel resection and reports having minimal problem since, not taking any other medications except his diabetic med's. Pt reports two weeks duration of persistent RUQ pain with multiple episodes of mostly non bloody diarrhea but has seen several bloody episodes. He explains pain in the RUQ area is constant and throbbing with cramps, 5/10 in severity, radiating to the epigastric area, no specific alleviating factors, no recent similar events, associated with bloating and poor oral inakte. He says his last Crohn's flare was associated with suprapubic area pain. He denies vomiting, no fevers, chills, no chest pain or shortness of breath, no recent sick contacts or exposures.   In ED, pt noted to be hemodynamically stable, not able to recall name of his GI doctor but says "he is across the street". CT abd consistent with active crohn's flare. Pt started on Ciprofloxacin and Flagyl. [facility] asked to admit for further evaluation.   Assessment and Plan:  Active Problems:  Crohn's disease, acute flare - appreciate GI input  - placed on empiric ABX Flagyl, continue day #3, and solumedrol  - follow up on GI recommendations  - provide supportive care with IVF, analgesia, antiemetics as needed  - follow up on KUB this AM and may be able to advance diet to full this AM Diabetes mellitus, poorly controlled with ? Gastroparesis  - pt apparently has not taken any medication in over one year  - contineu SSI, placed on Novolog BID and CBG's more stable  - A1C > 12 Elevated lipase  - likely in the setting of active Chron's flare  - lipase is WNL this AM   Radiological Exams on Admission:  Ct Abdomen Pelvis W Contrast 05/10/2014  Inflammatory changes involving the anastomotic site of the ascending colon and neo terminal ileum. These findings are consistent with active Crohn's disease considering the patient's history. No abscess is associated with this finding nor pneumoperitoneum.   US Abdomen Limited 05/10/2014 Fatty liver. Gallstones. No complicating factors are noted.  Consultants:  GI  Antibiotics:  Flagyl 6/26 -->   Code Status: Full  Family Communication: Pt at bedside  Disposition Plan: Home when medically stable   HPI/Subjective: No events overnight.   Objective: Filed Vitals:   05/11/14 0538 05/11/14 1400 05/11/14 2101 05/12/14 0531  BP: 98/59 117/70 105/68 113/72  Pulse: 69 73 86 68  Temp: 97.6 F (36.4 C) 97.9 F (36.6 C) 98.2 F (36.8 C) 97.8 F (36.6 C)  TempSrc: Oral Oral Oral Oral  Resp: 16 16 18 18   Height:      Weight:      SpO2: 96% 99% 100% 100%    Intake/Output Summary (Last 24 hours) at 05/12/14 1042 Last data filed at 05/12/14 0215  Gross per 24 hour  Intake    800 ml  Output    300 ml  Net    500 ml    Exam:   General:  Pt is alert, follows commands appropriately, not in acute distress  Cardiovascular: Regular rate and rhythm, S1/S2, no murmurs, no rubs, no gallops  Respiratory: Clear to auscultation bilaterally, no wheezing, no crackles, no rhonchi  Abdomen: Soft, tender in upper abd quads R>L, slightly distended, bowel sounds present, no guarding  Extremities: No edema, pulses DP and PT palpable bilaterally  Neuro: Grossly nonfocal  Data Reviewed: Basic Metabolic Panel:  Recent Labs Lab 05/10/14 1234 05/11/14 0524 05/12/14 0515  NA 135* 136* 138  K 3.9 4.1 3.7  CL 97 103 102  CO2 19 22 24   GLUCOSE 387* 283* 135*  BUN 16 10 7   CREATININE 1.05 0.79 0.81  CALCIUM 9.4 8.8 8.8   Liver Function Tests:  Recent Labs Lab 05/10/14 1234  AST 17  ALT 30  ALKPHOS 113  BILITOT 0.3  PROT 7.9  ALBUMIN 3.6    Recent Labs Lab 05/10/14 1234  05/11/14 0524  LIPASE 63* 31   CBC:  Recent Labs Lab 05/10/14 1234 05/11/14 0524 05/12/14 0515  WBC 7.7 7.5 10.8*  NEUTROABS 5.0  --   --   HGB 14.7 13.2 13.4  HCT 43.6 39.5 39.8  MCV 82.4 82.6 82.4  PLT 279 265 252   CBG:  Recent Labs Lab 05/11/14 0725 05/11/14 1210 05/11/14 1649 05/11/14 2059 05/12/14 0823  GLUCAP 322* 232* 274* 206* 113*   Scheduled Meds: . enoxaparin (LOVENOX) injection  40 mg Subcutaneous Q24H  . insulin aspart  0-9 Units Subcutaneous TID WC  . insulin aspart protamine- aspart  15 Units Subcutaneous BID WC  . LORazepam  1 mg Intravenous QHS  . methylPREDNISolone (SOLU-MEDROL) injection  40 mg Intravenous Daily  . metronidazole  500 mg Intravenous Q8H  . metronidazole  500 mg Intravenous Once   Continuous Infusions:  Faye Ramsay, MD  [facility] Pager [phone]  If 7PM-7AM, please contact night-coverage [email or web address] Password [email or web address] 05/12/2014, 10:42 AM   LOS: 2 days

## 2014-05-13 LAB — BASIC METABOLIC PANEL
BUN: 7 mg/dL (ref 6–23)
CO2: 26 mEq/L (ref 19–32)
Calcium: 8.3 mg/dL — ABNORMAL LOW (ref 8.4–10.5)
Chloride: 106 mEq/L (ref 96–112)
Creatinine, Ser: 0.85 mg/dL (ref 0.50–1.35)
GFR calc Af Amer: >90 mL/min (ref >90)
GFR calc non Af Amer: >90 mL/min (ref >90)
Glucose, Bld: 108 mg/dL — ABNORMAL HIGH (ref 70–99)
Potassium: 3.5 mEq/L — ABNORMAL LOW (ref 3.7–5.3)
Sodium: 143 mEq/L (ref 137–147)

## 2014-05-13 LAB — CBC
HCT: 37.8 % — ABNORMAL LOW (ref 39.0–52.0)
Hemoglobin: 12.8 g/dL — ABNORMAL LOW (ref 13.0–17.0)
MCH: 27.8 pg (ref 26.0–34.0)
MCHC: 33.9 g/dL (ref 30.0–36.0)
MCV: 82.2 fL (ref 78.0–100.0)
Platelets: 247 10*3/uL (ref 150–400)
RBC: 4.6 MIL/uL (ref 4.22–5.81)
RDW: 14 % (ref 11.5–15.5)
WBC: 8.4 10*3/uL (ref 4.0–10.5)

## 2014-05-13 LAB — GLUCOSE, CAPILLARY
Glucose-Capillary: 111 mg/dL — ABNORMAL HIGH (ref 70–99)
Glucose-Capillary: 80 mg/dL (ref 70–99)
Glucose-Capillary: 106 mg/dL — ABNORMAL HIGH (ref 70–99)

## 2014-05-13 MED ORDER — DIPHENHYDRAMINE HCL 50 MG/ML IJ SOLN
25.0000 mg | Freq: Once | INTRAMUSCULAR | Status: AC
  Administered 2014-05-13: 25 mg via INTRAVENOUS
  Filled 2014-05-13: qty 1

## 2014-05-13 MED ORDER — POTASSIUM CHLORIDE CRYS ER 20 MEQ PO TBCR
40.0000 meq | EXTENDED_RELEASE_TABLET | Freq: Once | ORAL | Status: AC
  Administered 2014-05-13: 40 meq via ORAL
  Filled 2014-05-13: qty 2

## 2014-05-13 NOTE — Progress Notes (Signed)
Patient known to Dr. Amedeo Plenty of [facility].  Patient obviously has history of non-compliance, but an active patient with the group. I have spoke with Dr. Oletta Lamas and he is agreeable to assume patient's care here in the hospital.

## 2014-05-13 NOTE — Progress Notes (Signed)
[facility] PROGRESS NOTE Subjective Pt has seen Dr Amedeo Plenty in office for Crohns and had ileocecal resection 2013 and reports that he has been completely w/o symptoms since then until now. Did not keep yearly f/u appt with Dr Amedeo Plenty in 2014 states that he was doing well Admitted with abd pain and diarrhea. CT in ER showed inflammation around anastomosis. Better with flagyl and steroids  Objective: Vital signs in last 24 hours: Temp:  [97.9 F (36.6 C)-98.4 F (36.9 C)] 97.9 F (36.6 C) (06/29 0519) Pulse Rate:  [58-71] 58 (06/29 0519) Resp:  [18] 18 (06/29 0519) BP: (108-118)/(66-73) 108/66 mmHg (06/29 0519) SpO2:  [96 %-99 %] 96 % (06/29 0519) Last BM Date: May 21, 2014  Intake/Output from previous day: 22-May-2023 0701 - 06/29 0700 In: 380 [P.O.:80; IV Piggyback:300] Out: 1000 [Urine:1000] Intake/Output this shift: Total I/O In: 60 [P.O.:60] Out: 375 [Urine:375]  PE: General--pleasant AA male Heart-- Lungs--clear Abdomen--nondistended good BSs mild tenderness  Lab Results:  Recent Labs  05/10/14 1234 05/11/14 0524 05/21/14 0515 05/13/14 0515  WBC 7.7 7.5 10.8* 8.4  HGB 14.7 13.2 13.4 12.8*  HCT 43.6 39.5 39.8 37.8*  PLT 279 265 252 247   BMET  Recent Labs  05/10/14 1234 05/11/14 0524 05-21-2014 0515 05/13/14 0515  NA 135* 136* 138 143  K 3.9 4.1 3.7 3.5*  CL 97 103 102 106  CO2 19 22 24 26   CREATININE 1.05 0.79 0.81 0.85   LFT  Recent Labs  05/10/14 1234  PROT 7.9  AST 17  ALT 30  ALKPHOS 113  BILITOT 0.3   PT/INR No results found for this basename: LABPROT, INR,  in the last 72 hours PANCREAS  Recent Labs  05/10/14 1234 05/11/14 0524  LIPASE 63* 31         Studies/Results: Dg Abd 2 Views  05/21/2014   CLINICAL DATA:  Crohn's disease with abdominal pain. Rule out obstruction.  EXAM: ABDOMEN - 2 VIEW  COMPARISON:  CT of 2 days prior  FINDINGS: Upright and supine views. The upright views demonstrate fluid levels within the transverse  colon. No small bowel fluid levels. No free intraperitoneal air.  Supine images demonstrate contrast and gas throughout the colon. Relative paucity of small bowel gas. Distal gas in contrast identified.  IMPRESSION: Nonspecific paucity of small bowel gas. This could be related to fluid-filled bowel loops as can be seen with partial obstruction.  Fluid levels in the colon suggests a diarrheal state.   Electronically Signed   By: Abigail Miyamoto M.D.   On: 21-May-2014 10:33    Medications: I have reviewed the patient's current medications.  Assessment/Plan: 1. Crohns with active disease at anastomosis. Doing better on steroids. Would try to change to oral prednisone tomorrow if tolerated CL diet and ? Discharge soon with f/u with Dr Amedeo Plenty? Colon as OP    Jared [name],Jared [name] 05/13/2014, 11:05 AM

## 2014-05-13 NOTE — Progress Notes (Addendum)
Patient ID: Jared [name], male   DOB: Jan 05, 1966, 48 y.o.   MRN: [id number]  [facility] PROGRESS NOTE  Jared [name] ERD:[id number] DOB: 1966/05/09 DOA: 05/10/2014 PCP: Jared Marek, MD  Brief narrative:  Pt is 48 yo male with DM and Crohn's disease diagnosed in 2008, status post small bowel resection and reports having minimal problem since, not taking any other medications except his diabetic med's. Pt reports two weeks duration of persistent RUQ pain with multiple episodes of mostly non bloody diarrhea but has seen several bloody episodes. He explains pain in the RUQ area is constant and throbbing with cramps, 5/10 in severity, radiating to the epigastric area, no specific alleviating factors, no recent similar events, associated with bloating and poor oral inakte. He says his last Crohn's flare was associated with suprapubic area pain. He denies vomiting, no fevers, chills, no chest pain or shortness of breath, no recent sick contacts or exposures.   In ED, pt noted to be hemodynamically stable, not able to recall name of his GI doctor but says "he is across the street". CT abd consistent with active crohn's flare. Pt started on Ciprofloxacin and Flagyl. [facility] asked to admit for further evaluation.   Assessment and Plan:  Active Problems:  Crohn's disease, acute flare  - appreciate GI input  - placed on empiric ABX Flagyl, continue day #4, and solumedrol, plan to transition to Prednisone in AM if pt feeling better   - follow up on GI recommendations  - provide supportive care with analgesia, antiemetics as needed  - advance diet as pt able to tolerate --> full liquid if possible in the afternoon  ? Partial SBO - per KUB - pt has been having diarrhea and flatus + - on clear liquid for now  Diabetes mellitus, poorly controlled with ? Gastroparesis  - pt apparently has not taken any medication in over one year  - contineu SSI, placed on Novolog BID and CBG's more stable  - A1C > 12   Elevated lipase  - likely in the setting of active Chron's flare  - lipase is WNL this AM  Hypokalemia - supplement and repeat BMP in AM  Radiological Exams on Admission:  Ct Abdomen Pelvis W Contrast 05/10/2014 Inflammatory changes involving the anastomotic site of the ascending colon and neo terminal ileum. These findings are consistent with active Crohn's disease considering the patient's history. No abscess is associated with this finding nor pneumoperitoneum.  US Abdomen Limited 05/10/2014 Fatty liver. Gallstones. No complicating factors are noted.  Consultants:  GI  Antibiotics:  Flagyl 6/26 -->   Code Status: Full  Family Communication: Pt at bedside  Disposition Plan: Home when medically stable   HPI/Subjective: No events overnight.   Objective: Filed Vitals:   05/12/14 1400 05/12/14 2114 05/13/14 0519 05/13/14 1344  BP: 118/73 118/67 108/66 119/64  Pulse: 67 71 58 59  Temp: 98.2 F (36.8 C) 98.4 F (36.9 C) 97.9 F (36.6 C) 99.3 F (37.4 C)  TempSrc: Oral Oral Oral Oral  Resp: 18 18 18 17   Height:      Weight:      SpO2: 97% 99% 96% 98%    Intake/Output Summary (Last 24 hours) at 05/13/14 1446 Last data filed at 05/13/14 1345  Gross per 24 hour  Intake    500 ml  Output    375 ml  Net    125 ml    Exam:   General:  Pt is alert, follows commands appropriately, not in acute  distress  Cardiovascular: Regular rate and rhythm, S1/S2, no murmurs, no rubs, no gallops  Respiratory: Clear to auscultation bilaterally, no wheezing, no crackles, no rhonchi  Abdomen: Soft, tender in epigastric area, slightly distended, bowel sounds present, no guarding  Extremities: No edema, pulses DP and PT palpable bilaterally  Neuro: Grossly nonfocal  Data Reviewed: Basic Metabolic Panel:  Recent Labs Lab 05/10/14 1234 05/11/14 0524 05/12/14 0515 05/13/14 0515  NA 135* 136* 138 143  K 3.9 4.1 3.7 3.5*  CL 97 103 102 106  CO2 19 22 24 26   GLUCOSE 387* 283* 135*  108*  BUN 16 10 7 7   CREATININE 1.05 0.79 0.81 0.85  CALCIUM 9.4 8.8 8.8 8.3*   Liver Function Tests:  Recent Labs Lab 05/10/14 1234  AST 17  ALT 30  ALKPHOS 113  BILITOT 0.3  PROT 7.9  ALBUMIN 3.6    Recent Labs Lab 05/10/14 1234 05/11/14 0524  LIPASE 63* 31   CBC:  Recent Labs Lab 05/10/14 1234 05/11/14 0524 05/12/14 0515 05/13/14 0515  WBC 7.7 7.5 10.8* 8.4  NEUTROABS 5.0  --   --   --   HGB 14.7 13.2 13.4 12.8*  HCT 43.6 39.5 39.8 37.8*  MCV 82.4 82.6 82.4 82.2  PLT 279 265 252 247  CBG:  Recent Labs Lab 05/12/14 1223 05/12/14 1738 05/12/14 2108 05/13/14 0724 05/13/14 1130  GLUCAP 206* 216* 169* 111* 80   Scheduled Meds: . enoxaparin (LOVENOX) injection  40 mg Subcutaneous Q24H  . insulin aspart  0-9 Units Subcutaneous TID WC  . insulin aspart protamine- aspart  15 Units Subcutaneous BID WC  . methylPREDNISolone (SOLU-MEDROL) injection  40 mg Intravenous Daily  . metronidazole  500 mg Intravenous Q8H  . metronidazole  500 mg Intravenous Once   Continuous Infusions:   Jared Ramsay, MD  [facility] Pager [phone]  If 7PM-7AM, please contact night-coverage [email or web address] Password [email or web address] 05/13/2014, 2:46 PM   LOS: 3 days

## 2014-05-14 LAB — CBC
HCT: 38.9 % — ABNORMAL LOW (ref 39.0–52.0)
Hemoglobin: 12.9 g/dL — ABNORMAL LOW (ref 13.0–17.0)
MCH: 27.4 pg (ref 26.0–34.0)
MCHC: 33.2 g/dL (ref 30.0–36.0)
MCV: 82.6 fL (ref 78.0–100.0)
Platelets: 257 10*3/uL (ref 150–400)
RBC: 4.71 MIL/uL (ref 4.22–5.81)
RDW: 14 % (ref 11.5–15.5)
WBC: 8.9 10*3/uL (ref 4.0–10.5)

## 2014-05-14 LAB — BASIC METABOLIC PANEL
BUN: 6 mg/dL (ref 6–23)
CO2: 23 mEq/L (ref 19–32)
Calcium: 8.3 mg/dL — ABNORMAL LOW (ref 8.4–10.5)
Chloride: 107 mEq/L (ref 96–112)
Creatinine, Ser: 0.86 mg/dL (ref 0.50–1.35)
GFR calc Af Amer: >90 mL/min (ref >90)
GFR calc non Af Amer: >90 mL/min (ref >90)
Glucose, Bld: 94 mg/dL (ref 70–99)
Potassium: 3.5 mEq/L — ABNORMAL LOW (ref 3.7–5.3)
Sodium: 141 mEq/L (ref 137–147)

## 2014-05-14 LAB — GLUCOSE, CAPILLARY
Glucose-Capillary: 82 mg/dL (ref 70–99)
Glucose-Capillary: 204 mg/dL — ABNORMAL HIGH (ref 70–99)

## 2014-05-14 MED ORDER — POTASSIUM CHLORIDE CRYS ER 20 MEQ PO TBCR
40.0000 meq | EXTENDED_RELEASE_TABLET | Freq: Once | ORAL | Status: AC
  Administered 2014-05-14: 40 meq via ORAL
  Filled 2014-05-14: qty 2

## 2014-05-14 MED ORDER — BLOOD GLUCOSE MONITOR KIT: PACK | Status: DC

## 2014-05-14 MED ORDER — POTASSIUM CHLORIDE CRYS ER 20 MEQ PO TBCR: 20.0000 meq | EXTENDED_RELEASE_TABLET | Freq: Every day | ORAL | Status: DC

## 2014-05-14 MED ORDER — PREDNISONE 10 MG PO TABS: ORAL_TABLET | ORAL | Status: DC

## 2014-05-14 MED ORDER — METRONIDAZOLE 500 MG PO TABS: 500.0000 mg | ORAL_TABLET | Freq: Three times a day (TID) | ORAL | Status: DC

## 2014-05-14 MED ORDER — GLIPIZIDE 10 MG PO TABS: 10.0000 mg | ORAL_TABLET | Freq: Every day | ORAL | Status: DC

## 2014-05-14 MED ORDER — INSULIN ASPART 100 UNIT/ML FLEXPEN: 40.0000 [IU] | PEN_INJECTOR | Freq: Every day | SUBCUTANEOUS | Status: DC

## 2014-05-14 MED ORDER — ONDANSETRON HCL 4 MG PO TABS: 4.0000 mg | ORAL_TABLET | Freq: Four times a day (QID) | ORAL | Status: DC | PRN

## 2014-05-14 MED ORDER — HYDROCODONE-ACETAMINOPHEN 5-325 MG PO TABS: 1.0000 | ORAL_TABLET | ORAL | Status: DC | PRN

## 2014-05-14 NOTE — Care Management Note (Signed)
    Page 1 of 1   05/14/2014     11:48:36 AM CARE MANAGEMENT NOTE 05/14/2014  Patient:  Jared [name], Jared [name]   Account Number:  [id number]  Date Initiated:  05/14/2014  Documentation initiated by:  Sunday Spillers  Subjective/Objective Assessment:   48 yo male admitted with crohn's flare. PTA lived at home alone.     Action/Plan:   Home when stable   Anticipated DC Date:  05/14/2014   Anticipated DC Plan:  [place]  CM consult  Medication Assistance  Outpatient Services - Pt will follow up      Choice offered to / List presented to:             Status of service:  Completed, signed off Medicare Important Message given?  NO (If response is "NO", the following Medicare IM given date fields will be blank) Date Medicare IM given:   Medicare IM given by:   Date Additional Medicare IM given:   Additional Medicare IM given by:    Discharge Disposition:  HOME/SELF CARE  Per UR Regulation:  Reviewed for med. necessity/level of care/duration of stay  If discussed at [place] of Stay Meetings, dates discussed:    Comments:  05-14-14 Sunday Spillers RN CM 1100 Spoke with patient at bedside. Plan is for d/c home today, patient is connected with [facility] and plans to f/u at one of the walkin clinic times. He understands to get medication and glucometer from them. Patient is in agreement with d/c plans and had no further d/c concerns.

## 2014-05-14 NOTE — Progress Notes (Signed)
Patient is alert and oriented, vital signs are stable, patient is tolerating his soft diet without complaints of nausea or vomiting no complaints of pain as well, prescriptions given and questions answered, patient to follow up with primary doctor and gastro doctor Neta Mends RN 05-14-2014 13:00pm

## 2014-05-14 NOTE — Progress Notes (Signed)
Inpatient Diabetes Program Recommendations  AACE/ADA: New Consensus Statement on Inpatient Glycemic Control (2013)  Target Ranges:  Prepandial:   less than 140 mg/dL      Peak postprandial:   less than 180 mg/dL (1-2 hours)      Critically ill patients:  140 - 180 mg/dL   Reason for Visit: Elevated HgbA1C  Results for Jared, [name] (MRN [id number]) as of 05/14/2014 12:00  Ref. Range 05/13/2014 07:24 05/13/2014 11:30 05/13/2014 21:46 05/14/2014 07:43 05/14/2014 11:52  Glucose-Capillary Latest Range: 70-99 mg/dL 111 (H) 80 106 (H) 82 204 (H)  Results for Jared, [name] (MRN [id number]) as of 05/14/2014 12:00  Ref. Range 05/11/2014 05:24  Hemoglobin A1C Latest Range: <5.7 % 12.4 (H)   Long discussion with pt regarding importance of controllling blood sugars at home. Pt states he will need prescription for meter. Has appt with [facility] in August. Discussed HgbA1C results and importance of taking insulin and meds as prescribed. Stressed importance of f/u with MD and take blood sugar log for MD to review and make any needed adjustments. Answered questions and pt seems to understand. Has been to [facility] in the past and declines at present.  Thank you. Lorenda Peck, RD, LDN, CDE Inpatient Diabetes Coordinator [phone]

## 2014-05-14 NOTE — Discharge Instructions (Signed)
Crohn's Disease Crohn's disease is a long-term (chronic) soreness and redness (inflammation) of the intestines (bowel). It can affect any portion of the digestive tract, from the mouth to the anus. It can also cause problems outside the digestive tract. Crohn's disease is closely related to a disease called ulcerative colitis (together, these two diseases are called inflammatory bowel disease).  CAUSES  The cause of Crohn's disease is not known. One Link Snuffer is that, in an easily affected person, the immune system is triggered to attack the body's own digestive tissue. Crohn's disease runs in families. It seems to be more common in certain geographic areas and amongst certain races. There are no clear-cut dietary causes.  SYMPTOMS  Crohn's disease can cause many different symptoms since it can affect many different parts of the body. Symptoms include:  Fatigue.  Weight loss.  Chronic diarrhea, sometime bloody.  Abdominal pain and cramps.  Fever.  Ulcers or canker sores in the mouth or rectum.  Anemia (low red blood cells).  Arthritis, skin problems, and eye problems may occur. Complications of Crohn's disease can include:  Series of holes (perforation) of the bowel.  Portions of the intestines sticking to each other (adhesions).  Obstruction of the bowel.  Fistula formation, typically in the rectal area but also in other areas. A fistula is an opening between the bowels and the outside, or between the bowels and another organ.  A painful crack in the mucous membrane of the anus (rectal fissure). DIAGNOSIS  Your caregiver may suspect Crohn's disease based on your symptoms and an exam. Blood tests may confirm that there is a problem. You may be asked to submit a stool specimen for examination. X-rays and CT scans may be necessary. Ultimately, the diagnosis is usually made after a procedure that uses a flexible tube that is inserted via your mouth or your anus. This is done under  sedation and is called either an upper endoscopy or colonoscopy. With these tests, the specialist can take tiny tissue samples and remove them from the inside of the bowel (biopsy). Examination of this biopsy tissue under a microscope can reveal Crohn's disease as the cause of your symptoms. Due to the many different forms that Crohn's disease can take, symptoms may be present for several years before a diagnosis is made. TREATMENT  Medications are often used to decrease inflammation and control the immune system. These include medicines related to aspirin, steroid medications, and newer and stronger medications to slow down the immune system. Some medications may be used as suppositories or enemas. A number of other medications are used or have been studied. Your caregiver will make specific recommendations. HOME CARE INSTRUCTIONS   Symptoms such as diarrhea can be controlled with medications. Avoid foods that have a laxative effect such as fresh fruit, vegetables and dairy products. During flare ups, you can rest your bowel by refraining from solid foods. Drink clear liquids frequently during the day (electrolyte or re-hydrating fluids are best. Your caregiver can help you with suggestions). Drink often to prevent loss of body fluids (dehydration). When diarrhea has cleared, eat small meals and more frequently. Avoid food additives and stimulants such as caffeine (coffee, tea, or chocolate). Enzyme supplements may help if you develop intolerance to a sugar in dairy products (lactose). Ask your caregiver or dietitian about specific dietary instructions.  Try to maintain a positive attitude. Learn relaxation techniques such as self hypnosis, mental imaging, and muscle relaxation.  If possible, avoid stresses which can aggravate your condition.  Exercise regularly.  Follow your diet.  Always get plenty of rest. SEEK MEDICAL CARE IF:   Your symptoms fail to improve after a week or two of new  treatment.  You experience continued weight loss.  You have ongoing cramps or loose bowels.  You develop a new skin rash, skin sores, or eye problems. SEEK IMMEDIATE MEDICAL CARE IF:   You have worsening of your symptoms or develop new symptoms.  You have a fever.  You develop bloody diarrhea.  You develop severe abdominal pain. MAKE SURE YOU:   Understand these instructions.  Will watch your condition.  Will get help right away if you are not doing well or get worse. Document Released: 08/11/2005 Document Revised: 02/26/2013 Document Reviewed: 07/10/2007 [facility] Patient Information 2015 [place], [place]. This information is not intended to replace advice given to you by your health care provider. Make sure you discuss any questions you have with your health care provider.

## 2014-05-14 NOTE — Discharge Summary (Signed)
Physician Discharge Summary  Jared [name]:[id number] DOB: 03-01-1966 DOA: 05/10/2014  PCP: Lorayne Marek, MD  Admit date: 05/10/2014 Discharge date: 05/14/2014  Recommendations for Outpatient Follow-up:  1. Pt will need to follow up with PCP in 2-3 weeks post discharge 2. Please obtain BMP to evaluate electrolytes and kidney function 3. Please also check CBC to evaluate Hg and Hct levels 4. Please note that pt was discharged on Prednisone taper as noted below 5. Pt also discharge on Flagyl to complete therapy as per GI recommendations 6. Please note that pt has never taken metformin and was discharged on Insulin and Glipizide for now to simplify the regimen 7. Metformin can be added by PCP as clinically indicated   Discharge Diagnoses:  Active Problems:   Crohn disease  Discharge Condition: Stable  Diet recommendation: Heart healthy diet discussed in details   Brief narrative:  Pt is 48 yo male with DM and Crohn's disease diagnosed in 2008, status post small bowel resection and reports having minimal problem since, not taking any other medications except his diabetic med's. Pt reports two weeks duration of persistent RUQ pain with multiple episodes of mostly non bloody diarrhea but has seen several bloody episodes. He explains pain in the RUQ area is constant and throbbing with cramps, 5/10 in severity, radiating to the epigastric area, no specific alleviating factors, no recent similar events, associated with bloating and poor oral inakte. He says his last Crohn's flare was associated with suprapubic area pain. He denies vomiting, no fevers, chills, no chest pain or shortness of breath, no recent sick contacts or exposures.   In ED, pt noted to be hemodynamically stable, not able to recall name of his GI doctor but says "he is across the street". CT abd consistent with active crohn's flare. Pt started on Ciprofloxacin and Flagyl. [facility] asked to admit for further evaluation.    Assessment and Plan:  Active Problems:  Crohn's disease, acute flare  - appreciate GI input  - placed on empiric ABX Flagyl, continue day #5 and upon discharge - pt has been on solumedrol inpatient and transitioned to oral Prednisone taper upon discharge  - provided supportive care with analgesia, antiemetics as needed and pt responded well  - GI recommended continuing clear liquid diet upon discharge until PO intake improves  ? Partial SBO  - per KUB  - pt has been having diarrhea and flatus +  - on clear liquid for now  Diabetes mellitus, poorly controlled with ? Gastroparesis  - pt apparently has not taken any medication in over one year  - continue Glipizide and Insulin upon discharge  - A1C > 12  Elevated lipase  - likely in the setting of active Chron's flare  - lipase is WNL this AM  Hypokalemia  - pt given one dose of K-dur prior to discharge   Radiological Exams on Admission:  Ct Abdomen Pelvis W Contrast 05/10/2014 Inflammatory changes involving the anastomotic site of the ascending colon and neo terminal ileum. These findings are consistent with active Crohn's disease considering the patient's history. No abscess is associated with this finding nor pneumoperitoneum.  US Abdomen Limited 05/10/2014 Fatty liver. Gallstones. No complicating factors are noted.  Consultants:  GI  Antibiotics:  Flagyl 6/26 -->   Code Status: Full  Family Communication: Pt at bedside   Discharge Exam: Filed Vitals:   05/14/14 0552  BP: 124/75  Pulse: 67  Temp: 98.1 F (36.7 C)  Resp: 18   Filed Vitals:  05/13/14 0519 05/13/14 1344 05/13/14 2147 05/14/14 0552  BP: 108/66 119/64 136/78 124/75  Pulse: 58 59 73 67  Temp: 97.9 F (36.6 C) 99.3 F (37.4 C) 98.5 F (36.9 C) 98.1 F (36.7 C)  TempSrc: Oral Oral Oral Oral  Resp: 18 17 18 18   Height:      Weight:      SpO2: 96% 98% 98% 99%    General: Pt is alert, follows commands appropriately, not in acute  distress Cardiovascular: Regular rate and rhythm, S1/S2 +, no murmurs, no rubs, no gallops Respiratory: Clear to auscultation bilaterally, no wheezing, no crackles, no rhonchi Abdominal: Soft, non tender, non distended, bowel sounds +, no guarding Extremities: no edema, no cyanosis, pulses palpable bilaterally DP and PT Neuro: Grossly nonfocal  Discharge Instructions  Discharge Instructions   Diet - low sodium heart healthy    Complete by:  As directed      Increase activity slowly    Complete by:  As directed             Medication List    STOP taking these medications       metFORMIN 500 MG tablet  Commonly known as:  GLUCOPHAGE     naproxen sodium 220 MG tablet  Commonly known as:  ANAPROX      TAKE these medications       blood glucose meter kit and supplies Kit  Dispense based on patient and insurance preference. Use up to four times daily as directed. (FOR ICD-9 250.00, 250.01).     glipiZIDE 10 MG tablet  Commonly known as:  GLUCOTROL  Take 1 tablet (10 mg total) by mouth daily before breakfast.     HYDROcodone-acetaminophen 5-325 MG per tablet  Commonly known as:  NORCO/VICODIN  Take 1-2 tablets by mouth every 4 (four) hours as needed for moderate pain.     insulin aspart 100 UNIT/ML FlexPen  Commonly known as:  NOVOLOG FLEXPEN  Inject 40 Units into the skin daily at 12 noon.     metroNIDAZOLE 500 MG tablet  Commonly known as:  FLAGYL  Take 1 tablet (500 mg total) by mouth 3 (three) times daily.     ondansetron 4 MG tablet  Commonly known as:  ZOFRAN  Take 1 tablet (4 mg total) by mouth every 6 (six) hours as needed for nausea.     predniSONE 10 MG tablet  Commonly known as:  DELTASONE  Take 50 mg tablet today and taper down by 5 mg daily until completed           Follow-up Information   Schedule an appointment as soon as possible for a visit with Lorayne Marek, MD.   Specialty:  Internal Medicine   Contact information:   [address] [place] [address] [phone]       Schedule an appointment as soon as possible for a visit with Missy Sabins, MD.   Specialty:  Gastroenterology   Contact information:   [address], [place] [address] [phone]       Follow up with Faye Ramsay, MD. (As needed, If symptoms worsen)    Specialty:  Internal Medicine   Contact information:   [address] [place] [address] [phone]        The results of significant diagnostics from this hospitalization (including imaging, microbiology, ancillary and laboratory) are listed below for reference.     Microbiology: No results found for this or any previous  visit (from the past 240 hour(s)).   Labs: Basic Metabolic Panel:  Recent Labs Lab 05/10/14 1234 05/11/14 0524 05/12/14 0515 05/13/14 0515 05/14/14 0515  NA 135* 136* 138 143 141  K 3.9 4.1 3.7 3.5* 3.5*  CL 97 103 102 106 107  CO2 19 22 24 26 23   GLUCOSE 387* 283* 135* 108* 94  BUN 16 10 7 7 6   CREATININE 1.05 0.79 0.81 0.85 0.86  CALCIUM 9.4 8.8 8.8 8.3* 8.3*   Liver Function Tests:  Recent Labs Lab 05/10/14 1234  AST 17  ALT 30  ALKPHOS 113  BILITOT 0.3  PROT 7.9  ALBUMIN 3.6    Recent Labs Lab 05/10/14 1234 05/11/14 0524  LIPASE 63* 31   CBC:  Recent Labs Lab 05/10/14 1234 05/11/14 0524 05/12/14 0515 05/13/14 0515 05/14/14 0515  WBC 7.7 7.5 10.8* 8.4 8.9  NEUTROABS 5.0  --   --   --   --   HGB 14.7 13.2 13.4 12.8* 12.9*  HCT 43.6 39.5 39.8 37.8* 38.9*  MCV 82.4 82.6 82.4 82.2 82.6  PLT 279 265 252 247 257   CBG:  Recent Labs Lab 05/12/14 2108 05/13/14 0724 05/13/14 1130 05/13/14 2146 05/14/14 0743  GLUCAP 169* 111* 80 106* 82     SIGNED: Time coordinating discharge: Over 30 minutes  Jared Canizales, MD  [facility] 05/14/2014, 10:35 AM Pager [phone]  If 7PM-7AM, please contact night-coverage [email or web address] Password [email or web address]

## 2014-05-14 NOTE — Progress Notes (Signed)
[facility] PROGRESS NOTE Subjective Pt on CLs IV solumedrol and flagyl, some pain and cramps last pm.  Objective: Vital signs in last 24 hours: Temp:  [98.1 F (36.7 C)-99.3 F (37.4 C)] 98.1 F (36.7 C) (06/30 0552) Pulse Rate:  [59-73] 67 (06/30 0552) Resp:  [17-18] 18 (06/30 0552) BP: (119-136)/(64-78) 124/75 mmHg (06/30 0552) SpO2:  [98 %-99 %] 99 % (06/30 0552) Last BM Date: May 24, 2014  Intake/Output from previous day: 06/29 0701 - 06/30 0700 In: 420 [P.O.:420] Out: 1475 [Urine:1475] Intake/Output this shift:    PE: General--no distress, talking on phone  Abdomen--min distended, good BSs, min tender.  Lab Results:  Recent Labs  05-24-14 0515 05/13/14 0515 05/14/14 0515  WBC 10.8* 8.4 8.9  HGB 13.4 12.8* 12.9*  HCT 39.8 37.8* 38.9*  PLT 252 247 257   BMET  Recent Labs  05-24-14 0515 05/13/14 0515 05/14/14 0515  NA 138 143 141  K 3.7 3.5* 3.5*  CL 102 106 107  CO2 24 26 23   CREATININE 0.81 0.85 0.86   LFT No results found for this basename: PROT, AST, ALT, ALKPHOS, BILITOT, BILIDIR, IBILI,  in the last 72 hours PT/INR No results found for this basename: LABPROT, INR,  in the last 72 hours PANCREAS No results found for this basename: LIPASE,  in the last 72 hours       Studies/Results: Dg Abd 2 Views  2014/05/24   CLINICAL DATA:  Crohn's disease with abdominal pain. Rule out obstruction.  EXAM: ABDOMEN - 2 VIEW  COMPARISON:  CT of 2 days prior  FINDINGS: Upright and supine views. The upright views demonstrate fluid levels within the transverse colon. No small bowel fluid levels. No free intraperitoneal air.  Supine images demonstrate contrast and gas throughout the colon. Relative paucity of small bowel gas. Distal gas in contrast identified.  IMPRESSION: Nonspecific paucity of small bowel gas. This could be related to fluid-filled bowel loops as can be seen with partial obstruction.  Fluid levels in the colon suggests a diarrheal state.    Electronically Signed   By: Abigail Miyamoto M.D.   On: 05/24/14 10:33    Medications: I have reviewed the patient's current medications.  Assessment/Plan: 1. Crohns. Seems to be doing a little better. Would try to transition to oral prednisone and probably give 1 week of oral flagyl and send home on CLs advancing to low residue.  F/u with Dr Amedeo Plenty in about 2 weeks.   Makara Lanzo JR,Kamaria Lucia L 05/14/2014, 7:13 AM

## 2014-05-16 ENCOUNTER — Emergency Department (HOSPITAL_COMMUNITY)
Admission: EM | Admit: 2014-05-16 | Discharge: 2014-05-17 | Disposition: A | Discharge status: Home / Self Care | Payer: Self-pay | Attending: Emergency Medicine | Admitting: Emergency Medicine

## 2014-05-16 DIAGNOSIS — K0530 Chronic periodontitis, unspecified: Secondary | ICD-10-CM | POA: Insufficient documentation

## 2014-05-16 DIAGNOSIS — Z79899 Other long term (current) drug therapy: Secondary | ICD-10-CM | POA: Insufficient documentation

## 2014-05-16 DIAGNOSIS — F17200 Nicotine dependence, unspecified, uncomplicated: Secondary | ICD-10-CM | POA: Insufficient documentation

## 2014-05-16 DIAGNOSIS — Z792 Long term (current) use of antibiotics: Secondary | ICD-10-CM | POA: Insufficient documentation

## 2014-05-16 DIAGNOSIS — K5090 Crohn's disease, unspecified, without complications: Secondary | ICD-10-CM | POA: Insufficient documentation

## 2014-05-16 DIAGNOSIS — E119 Type 2 diabetes mellitus without complications: Secondary | ICD-10-CM | POA: Insufficient documentation

## 2014-05-16 LAB — GLUCOSE, CAPILLARY: Glucose-Capillary: 148 mg/dL — ABNORMAL HIGH (ref 70–99)

## 2014-05-17 ENCOUNTER — Emergency Department (HOSPITAL_COMMUNITY)
Admission: EM | Admit: 2014-05-17 | Discharge: 2014-05-17 | Disposition: A | Discharge status: Home / Self Care | Payer: Self-pay | Attending: Emergency Medicine | Admitting: Emergency Medicine

## 2014-05-17 ENCOUNTER — Encounter (HOSPITAL_COMMUNITY): Payer: Self-pay | Admitting: Emergency Medicine

## 2014-05-17 DIAGNOSIS — Z794 Long term (current) use of insulin: Secondary | ICD-10-CM | POA: Insufficient documentation

## 2014-05-17 DIAGNOSIS — K006 Disturbances in tooth eruption: Secondary | ICD-10-CM | POA: Insufficient documentation

## 2014-05-17 DIAGNOSIS — Z79899 Other long term (current) drug therapy: Secondary | ICD-10-CM | POA: Insufficient documentation

## 2014-05-17 DIAGNOSIS — K029 Dental caries, unspecified: Secondary | ICD-10-CM | POA: Insufficient documentation

## 2014-05-17 DIAGNOSIS — F17200 Nicotine dependence, unspecified, uncomplicated: Secondary | ICD-10-CM | POA: Insufficient documentation

## 2014-05-17 DIAGNOSIS — Z792 Long term (current) use of antibiotics: Secondary | ICD-10-CM | POA: Insufficient documentation

## 2014-05-17 DIAGNOSIS — E119 Type 2 diabetes mellitus without complications: Secondary | ICD-10-CM | POA: Insufficient documentation

## 2014-05-17 DIAGNOSIS — K047 Periapical abscess without sinus: Secondary | ICD-10-CM | POA: Insufficient documentation

## 2014-05-17 MED ORDER — MORPHINE SULFATE 4 MG/ML IJ SOLN
8.0000 mg | Freq: Once | INTRAMUSCULAR | Status: AC
  Administered 2014-05-17: 8 mg via INTRAMUSCULAR
  Filled 2014-05-17: qty 2

## 2014-05-17 MED ORDER — IBUPROFEN 800 MG PO TABS: 800.0000 mg | ORAL_TABLET | Freq: Three times a day (TID) | ORAL | Status: DC | PRN

## 2014-05-17 MED ORDER — KETOROLAC TROMETHAMINE 60 MG/2ML IM SOLN
60.0000 mg | Freq: Once | INTRAMUSCULAR | Status: AC
  Administered 2014-05-17: 60 mg via INTRAMUSCULAR
  Filled 2014-05-17: qty 2

## 2014-05-17 MED ORDER — HYDROCODONE-ACETAMINOPHEN 5-325 MG PO TABS
1.0000 | ORAL_TABLET | Freq: Once | ORAL | Status: AC
  Administered 2014-05-17: 1 via ORAL
  Filled 2014-05-17: qty 1

## 2014-05-17 MED ORDER — PENICILLIN V POTASSIUM 500 MG PO TABS: 500.0000 mg | ORAL_TABLET | Freq: Four times a day (QID) | ORAL | Status: AC

## 2014-05-17 MED ORDER — IBUPROFEN 600 MG PO TABS: 600.0000 mg | ORAL_TABLET | Freq: Four times a day (QID) | ORAL | Status: DC | PRN

## 2014-05-17 MED ORDER — HYDROCODONE-ACETAMINOPHEN 5-325 MG PO TABS: 1.0000 | ORAL_TABLET | Freq: Four times a day (QID) | ORAL | Status: DC | PRN

## 2014-05-17 MED ORDER — OXYCODONE-ACETAMINOPHEN 5-325 MG PO TABS: 1.0000 | ORAL_TABLET | Freq: Four times a day (QID) | ORAL | Status: DC | PRN

## 2014-05-17 NOTE — ED Provider Notes (Signed)
Medical screening examination/treatment/procedure(s) were conducted as a shared visit with non-physician practitioner(s) and myself.  I personally evaluated the patient during the encounter.   EKG Interpretation None      Pt seen by me just few hours ago. Returned for toothache. Seems like his meds wore off, and pain started. Still no red flags for deep infection. Stable for d/c   Varney Biles, MD 05/17/14 2353

## 2014-05-17 NOTE — ED Notes (Signed)
Pt was seen earlier tonight for dental pain, he states that the norco worked initially and got the prescriptions filled, he states the pain meds are no longer effective and his jaw is swollen worse

## 2014-05-17 NOTE — ED Notes (Signed)
Pt complains of left side dental pain since Monday

## 2014-05-17 NOTE — ED Provider Notes (Signed)
CSN: [id number]     Arrival date & time 05/17/14  0617 History   First MD Initiated Contact with Patient 05/17/14 [phone]     Chief Complaint  Patient presents with  . Facial Swelling  . Dental Pain     (Consider location/radiation/quality/duration/timing/severity/associated sxs/prior Treatment) HPI Patient presents to the emergency department with continued pain from a toothache and facial swelling.  Patient, states, that he was seen earlier this morning.  For similar complaints.  The patient, states the pain medication worked, but the pain came back fairly soon after he took the medicine, and he states that his pain is uncontrolled at this time.  Patient denies nausea, vomiting, headache, blurred vision, weakness, dizziness, back pain, neck pain, chest pain, shortness of breath, or syncope.  The patient, states, that did take 1 dose of the Pen-Vee K Past Medical History  Diagnosis Date  . Crohn disease 2008    with hospital admission in 2011, and 8/12 for flare up and SBO relieved with bowel rest. no suregery, no meds for CD  . Diabetes mellitus type II    Past Surgical History  Procedure Laterality Date  . 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 Scrotal surgery  ~ 1971    "took out a pellet"  . Laparotomy  01/25/2012    Procedure: EXPLORATORY LAPAROTOMY;  Surgeon: [name], MD;  Location: [facility];  Service: General;  Laterality: N/A;  . Bowel resection  01/25/2012    Procedure: SMALL BOWEL RESECTION;  Surgeon: [name], MD;  Location: [facility];  Service: General;  Laterality: N/A;   Family History  Problem Relation Age of Onset  . Cancer Mother     lung   History  Substance Use Topics  . Smoking status: Current Every Day Smoker -- 0.25 packs/day for 30 years    Types: Cigarettes  . Smokeless tobacco: Former Systems developer    Types: Chew    Quit date: 01/20/1997  . Alcohol Use: No    Review of Systems  All other systems negative except as documented in  the HPI. All pertinent positives and negatives as reviewed in the HPI.  Allergies  Review of patient's allergies indicates no known allergies.  Home Medications   Prior to Admission medications   Medication Sig Start Date End Date Taking? Authorizing Provider  glipiZIDE (GLUCOTROL) 10 MG tablet Take 1 tablet (10 mg total) by mouth daily before breakfast. 05/14/14   Theodis Blaze, MD  HYDROcodone-acetaminophen (NORCO/VICODIN) 5-325 MG per tablet Take 1-2 tablets by mouth every 4 (four) hours as needed for moderate pain. 05/14/14   Theodis Blaze, MD  HYDROcodone-acetaminophen (NORCO/VICODIN) 5-325 MG per tablet Take 1 tablet by mouth every 6 (six) hours as needed for severe pain. 05/17/14   Varney Biles, MD  ibuprofen (ADVIL,MOTRIN) 600 MG tablet Take 1 tablet (600 mg total) by mouth every 6 (six) hours as needed. 05/17/14   Varney Biles, MD  ibuprofen (ADVIL,MOTRIN) 800 MG tablet Take 800 mg by mouth every 8 (eight) hours as needed for moderate pain.    Historical Provider, MD  insulin aspart (NOVOLOG FLEXPEN) 100 UNIT/ML FlexPen Inject 40 Units into the skin daily at 12 noon. 05/14/14   Theodis Blaze, MD  metroNIDAZOLE (FLAGYL) 500 MG tablet Take 1 tablet (500 mg total) by mouth 3 (three) times daily. 05/14/14   Theodis Blaze, MD  ondansetron (ZOFRAN) 4 MG tablet Take 1 tablet (4 mg total) by  mouth every 6 (six) hours as needed for nausea. 05/14/14   Theodis Blaze, MD  penicillin v potassium (VEETID) 500 MG tablet Take 1 tablet (500 mg total) by mouth 4 (four) times daily. 05/17/14 05/24/14  Varney Biles, MD  potassium chloride SA (K-DUR,KLOR-CON) 20 MEQ tablet Take 1 tablet (20 mEq total) by mouth daily. 05/14/14   Theodis Blaze, MD  predniSONE (DELTASONE) 10 MG tablet Take 50 mg tablet today and taper down by 5 mg daily until completed 05/14/14   Theodis Blaze, MD   BP 161/78  Pulse 62  Temp(Src) 98.1 F (36.7 C) (Oral)  Resp 20  SpO2 98% Physical Exam  Nursing note and vitals  reviewed. Constitutional: He appears well-developed and well-nourished.  HENT:  Mouth/Throat: Uvula is midline. No trismus in the jaw. Abnormal dentition. Dental caries present. No uvula swelling.    Cardiovascular: Normal rate, regular rhythm and normal heart sounds.  Exam reveals no gallop and no friction rub.   No murmur heard. Pulmonary/Chest: Effort normal and breath sounds normal. No respiratory distress.    ED Course  Procedures (including critical care time) Patient most likely has a dental abscess and will be referred to dentistry.  Told to return here as needed.  Patient is in no acute distress and vital signs have been stable.  Patient's airway is patent and feel the patient needs further antibiotics before any other determinations can be made about his condition.  There is no obvious worsening, based on his previous visit     Brent General, PA-C 05/17/14 [phone]

## 2014-05-17 NOTE — Discharge Instructions (Signed)
Return here as needed.  Follow up with dentist provided use warm compresses and heat on your face

## 2014-05-17 NOTE — ED Notes (Signed)
Patient Blood Sugar-184

## 2014-05-17 NOTE — Discharge Instructions (Signed)
Please see the Dentist at your earliest convenience. Return to the ER if there is any increased pain, fevers, chills, confusion, inability to open jaw due to pain.   Periodontal Disease Periodontal disease, or gum disease, is a type of oral disease that affects the surrounding and supporting tissues of the teeth. These include the gums (gingivae), ligaments, and tooth socket (alveolar bone). Periodontal disease can affect one tooth or many teeth. If left untreated, it may lead to tooth loss.  CAUSES The main cause of periodontal disease is dental plaque, which contains harmful bacteria. These bacteria can cause the gums to become inflamed and infected. Further progression of the disease can damage the other supporting tissues.  RISK FACTORS  Diabetes.   Smoking and tobacco use.   Genetics.   Hormonal changes of puberty, menopause, and pregnancy.   Stress.   Clenching or grinding your teeth.   Substance abuse.  Poor nutrition.   Diseases that interfere with the body's immune system.   Certain medicines. SIGNS AND SYMPTOMS  Red or swollen gums.  Bad breath that does not go away.  Gums that have pulled away from the teeth.  Gums that bleed easily.  Permanent teeth that are loose or separating.  Pain when chewing.  Changes in the way your teeth fit together.  Sensitive teeth. DIAGNOSIS  A thorough examination of the periodontal tissues will be done by your dentist. X-rays may be needed. Evaluation of your medical history will be needed to see if there are other factors or underlying conditions that may contribute to the disease. TREATMENT The number and types of treatment will vary depending on the extent of the disease. Treatment may include brushing and flossing only. Further disease progression may necessitate scaling and root planing or even surgery. The main goal is to control the infection. Good oral hygiene at home is necessary for the success of all types  of treatment. HOME CARE INSTRUCTIONS   Practice good oral hygiene. This includes flossing and brushing your teeth every day.   See your dentist regularly, at least 2 times per year.   Stop smoking if you smoke.  Eat a well-balanced diet. SEEK IMMEDIATE DENTAL CARE IF:   You have any signs or symptoms of periodontal disease along with:  Swelling of your face, neck, or jaw.  Inability to open your mouth.  Severe pain uncontrolled by pain medicine.  You have a fever or persistent symptoms for more than 2-3 days.  You have a fever and your symptoms suddenly get worse. Document Released: 11/04/2003 Document Revised: 07/04/2013 Document Reviewed: 04/10/2013 [facility] Patient Information 2015 [place], [place]. This information is not intended to replace advice given to you by your health care provider. Make sure you discuss any questions you have with your health care provider.  RESOURCE GUIDE  Chronic Pain Problems: Contact [facility]  [phone] Patients need to be referred by their primary care doctor.  Insufficient Money for Medicine: Contact [employer]:  call "211."   No Primary Care Doctor: - Call Health Connect  [phone] - can help you locate a primary care doctor that  accepts your insurance, provides certain services, etc. - Physician Referral Service- [phone]  Agencies that provide inexpensive medical care: - [facility]  [phone] - [facility]  [phone] - [facility]  [phone] - [facility]  [phone] - [facility]  [phone] - [facility]  [phone]  [place] Providers: - [facility]- [address]  [phone], Mon-Fri 9am-7pm, Sat 9am-1pm - [place], [place], [address] [place]  [phone] [facility]  [facility]- [address]  [place], [address], [phone]  Only accepts [place] [facility] [place] patients after they have their name  applied to their card  Self Pay (no insurance) in [facility]: - Sickle Cell Patients: Dr Kevan Ny, [facility]  [place], [facility]- [place]  [facility]- [address], [place] [facility]- see information above (Speak to [employer] if you do not have insurance)       -  [facility]- [place],  [place], [place]  Dr Vista Lawman-  [address], [place], [place]       -  Urgent Medical and [facility] - [address], [phone]       -  [facility] - [address], [place], also [address], [phone]       -    [facility]- [place], [place], 1st & 3rd Saturday        every month, 10am-1pm  [facility] [place], [place] [address] [phone]  [facility] [place]. [place], [place] [address] [phone]  1) Find a Doctor and Pay Out of Pocket Although you won't have to find out who is covered by your insurance plan, it is a good idea to ask around and get recommendations. You will then need to call the office and see if the doctor you have chosen will accept you as a new patient and what types of options they offer for patients who are self-pay. Some doctors offer discounts or will set up payment plans for their patients who do not have insurance, but you will need to ask so you aren't surprised when you get to your appointment.  2) Contact Your Local Health Department Not all health departments have doctors that can see patients for sick visits, but many do, so it is worth a  call to see if yours does. If you don't know where your local health department is, you can check in your phone book. The CDC also has a tool to help you locate your state's health department, and many state websites also have listings of all of their local health departments.  3) Find a [facility] If your illness is not likely to be very severe or complicated, you may want to try a walk in clinic. These are popping up all over the country in pharmacies, drugstores, and shopping centers. They're usually staffed by nurse practitioners or physician assistants that have been trained to treat common illnesses and complaints. They're usually fairly quick and inexpensive. However, if you have serious medical issues or chronic medical problems, these are probably not your best option  [facility] [place], [facility], [place]  [address] Sleeper, [place], phone [phone] or [phone].  Monday - Friday, call for an appointment. - [place], [facility], [address], [place], phone [phone] or [phone].  Monday - Friday, call for an appointment.  Abuse/Neglect: - [place] [phone] - [place] [phone] (After Hours)  Emergency Shelter:  [facility] [phone]  Maternity Homes: - [facility] [phone] - [place] [phone]  MRSA Hotline #:   564-747-9472  Dental Assistance If unable to pay or uninsured, contact:  [facility]. to become qualified for the adult dental clinic.  Patients with Medicaid: [facility] [phone] [address], [place] [address], [phone]  If unable to pay, or uninsured, contact [facility] [phone] in [place], [place] in [facility]) to become qualified for the adult  dental clinic  [facility] [address] [place], [place] [address] [phone] [email or web address]  Other [place]: - [facility] [place], [place], [place], [address], [place], 2nd and 4th Thursday of the month at 6:30am.  10 clients each day by appointment, can sometimes see walk-in patients if someone does not show for an appointment. [facility]- [address] [place], [place], [address], [place], [place], [place], [address], [place] Department- [place] Department- [place] Department- [phone]

## 2014-05-17 NOTE — ED Provider Notes (Signed)
CSN: [id number]     Arrival date & time 05/16/14  2358 History   First MD Initiated Contact with Patient 05/17/14 0013     Chief Complaint  Patient presents with  . Dental Pain     (Consider location/radiation/quality/duration/timing/severity/associated sxs/prior Treatment) HPI Comments: Pt comes in with cc of toothache. Patient started having toothache on Monday, and it has become more constant and painful. Pt also is having some earache on the ipsilateral side.  No n/v/f/c. Pt has crohns and DM, takes prednisone. He has no pain with jaw opening.  Patient is a 48 y.o. male presenting with tooth pain. The history is provided by the patient.  Dental Pain Associated symptoms: no fever     Past Medical History  Diagnosis Date  . Crohn disease 2008    with hospital admission in 2011, and 8/12 for flare up and SBO relieved with bowel rest. no suregery, no meds for CD  . Diabetes mellitus type II    Past Surgical History  Procedure Laterality Date  . 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 Scrotal surgery  ~ 1971    "took out a pellet"  . Laparotomy  01/25/2012    Procedure: EXPLORATORY LAPAROTOMY;  Surgeon: [name], MD;  Location: [facility];  Service: General;  Laterality: N/A;  . Bowel resection  01/25/2012    Procedure: SMALL BOWEL RESECTION;  Surgeon: [name], MD;  Location: [facility];  Service: General;  Laterality: N/A;   Family History  Problem Relation Age of Onset  . Cancer Mother     lung   History  Substance Use Topics  . Smoking status: Current Every Day Smoker -- 0.25 packs/day for 30 years    Types: Cigarettes  . Smokeless tobacco: Former Systems developer    Types: Chew    Quit date: 01/20/1997  . Alcohol Use: No    Review of Systems  Constitutional: Negative for fever and activity change.  HENT: Positive for dental problem. Negative for tinnitus.   Respiratory: Negative for cough.   Gastrointestinal: Negative for abdominal pain.       Allergies  Review of patient's allergies indicates no known allergies.  Home Medications   Prior to Admission medications   Medication Sig Start Date End Date Taking? Authorizing Provider  ibuprofen (ADVIL,MOTRIN) 800 MG tablet Take 800 mg by mouth every 8 (eight) hours as needed for moderate pain.   Yes Historical Provider, MD  glipiZIDE (GLUCOTROL) 10 MG tablet Take 1 tablet (10 mg total) by mouth daily before breakfast. 05/14/14   Theodis Blaze, MD  HYDROcodone-acetaminophen (NORCO/VICODIN) 5-325 MG per tablet Take 1-2 tablets by mouth every 4 (four) hours as needed for moderate pain. 05/14/14   Theodis Blaze, MD  HYDROcodone-acetaminophen (NORCO/VICODIN) 5-325 MG per tablet Take 1 tablet by mouth every 6 (six) hours as needed for severe pain. 05/17/14   Varney Biles, MD  ibuprofen (ADVIL,MOTRIN) 600 MG tablet Take 1 tablet (600 mg total) by mouth every 6 (six) hours as needed. 05/17/14   Varney Biles, MD  insulin aspart (NOVOLOG FLEXPEN) 100 UNIT/ML FlexPen Inject 40 Units into the skin daily at 12 noon. 05/14/14   Theodis Blaze, MD  metroNIDAZOLE (FLAGYL) 500 MG tablet Take 1 tablet (500 mg total) by mouth 3 (three) times daily. 05/14/14   Theodis Blaze, MD  ondansetron (ZOFRAN) 4 MG tablet Take 1 tablet (4 mg total) by mouth every 6 (six) hours as needed  for nausea. 05/14/14   Theodis Blaze, MD  penicillin v potassium (VEETID) 500 MG tablet Take 1 tablet (500 mg total) by mouth 4 (four) times daily. 05/17/14 05/24/14  Varney Biles, MD  potassium chloride SA (K-DUR,KLOR-CON) 20 MEQ tablet Take 1 tablet (20 mEq total) by mouth daily. 05/14/14   Theodis Blaze, MD  predniSONE (DELTASONE) 10 MG tablet Take 50 mg tablet today and taper down by 5 mg daily until completed 05/14/14   Theodis Blaze, MD   BP 143/74  Pulse 64  Temp(Src) 97.9 F (36.6 C) (Oral)  Resp 20  Ht 6' 2"  (1.88 m)  Wt 187 lb (84.823 kg)  BMI 24.00 kg/m2  SpO2 98% Physical Exam  Nursing note and vitals  reviewed. Constitutional: He appears well-developed.  HENT:  Mouth/Throat: No oropharyngeal exudate.  Tooth 17/18 - dental erosion with tenderness to palpation of the tooth and the surrounding gingiva. No gingival abscess.   Eyes: Conjunctivae are normal.  Neck: Neck supple.    ED Course  Procedures (including critical care time) Labs Review Labs Reviewed - No data to display  Imaging Review No results found.   EKG Interpretation None      MDM   Final diagnoses:  Periodontitis    DDx includes: - Periapical tooth infection - Dental abscess - Gingivitis - Dental trauma - Pulpitis - Nerve root compression  Pt with toothache - likely periodontal infection. He is immunocompromised, so strict return precautions discussed, in case his infection spreads. Dentist appt stressed. Will give pen v k and some oral analgesics.   Varney Biles, MD 05/17/14 [phone]

## 2014-05-18 ENCOUNTER — Emergency Department (HOSPITAL_COMMUNITY)
Admission: EM | Admit: 2014-05-18 | Discharge: 2014-05-18 | Disposition: A | Discharge status: Home / Self Care | Payer: Self-pay | Attending: Emergency Medicine | Admitting: Emergency Medicine

## 2014-05-18 ENCOUNTER — Encounter (HOSPITAL_COMMUNITY): Payer: Self-pay | Admitting: Emergency Medicine

## 2014-05-18 DIAGNOSIS — R221 Localized swelling, mass and lump, neck: Secondary | ICD-10-CM

## 2014-05-18 DIAGNOSIS — Z79899 Other long term (current) drug therapy: Secondary | ICD-10-CM | POA: Insufficient documentation

## 2014-05-18 DIAGNOSIS — F17200 Nicotine dependence, unspecified, uncomplicated: Secondary | ICD-10-CM | POA: Insufficient documentation

## 2014-05-18 DIAGNOSIS — R220 Localized swelling, mass and lump, head: Secondary | ICD-10-CM | POA: Insufficient documentation

## 2014-05-18 DIAGNOSIS — E119 Type 2 diabetes mellitus without complications: Secondary | ICD-10-CM | POA: Insufficient documentation

## 2014-05-18 DIAGNOSIS — Z8719 Personal history of other diseases of the digestive system: Secondary | ICD-10-CM | POA: Insufficient documentation

## 2014-05-18 DIAGNOSIS — R51 Headache: Secondary | ICD-10-CM | POA: Insufficient documentation

## 2014-05-18 DIAGNOSIS — IMO0002 Reserved for concepts with insufficient information to code with codable children: Secondary | ICD-10-CM | POA: Insufficient documentation

## 2014-05-18 DIAGNOSIS — K047 Periapical abscess without sinus: Secondary | ICD-10-CM | POA: Insufficient documentation

## 2014-05-18 LAB — CBG MONITORING, ED: Glucose-Capillary: 185 mg/dL — ABNORMAL HIGH (ref 70–99)

## 2014-05-18 LAB — CBC WITH DIFFERENTIAL/PLATELET
Basophils Absolute: 0 10*3/uL (ref 0.0–0.1)
Basophils Relative: 0 % (ref 0–1)
Eosinophils Absolute: 0 10*3/uL (ref 0.0–0.7)
Eosinophils Relative: 0 % (ref 0–5)
HCT: 38.8 % — ABNORMAL LOW (ref 39.0–52.0)
Hemoglobin: 13 g/dL (ref 13.0–17.0)
Lymphocytes Relative: 19 % (ref 12–46)
Lymphs Abs: 1.7 10*3/uL (ref 0.7–4.0)
MCH: 28 pg (ref 26.0–34.0)
MCHC: 33.5 g/dL (ref 30.0–36.0)
MCV: 83.4 fL (ref 78.0–100.0)
Monocytes Absolute: 1.1 10*3/uL — ABNORMAL HIGH (ref 0.1–1.0)
Monocytes Relative: 12 % (ref 3–12)
Neutro Abs: 6.1 10*3/uL (ref 1.7–7.7)
Neutrophils Relative %: 69 % (ref 43–77)
Platelets: 232 10*3/uL (ref 150–400)
RBC: 4.65 MIL/uL (ref 4.22–5.81)
RDW: 14.5 % (ref 11.5–15.5)
WBC: 8.9 10*3/uL (ref 4.0–10.5)

## 2014-05-18 LAB — I-STAT CHEM 8, ED
BUN: 3 mg/dL — ABNORMAL LOW (ref 6–23)
Calcium, Ion: 1.16 mmol/L (ref 1.12–1.23)
Chloride: 100 mEq/L (ref 96–112)
Creatinine, Ser: 0.8 mg/dL (ref 0.50–1.35)
Glucose, Bld: 218 mg/dL — ABNORMAL HIGH (ref 70–99)
HCT: 41 % (ref 39.0–52.0)
Hemoglobin: 13.9 g/dL (ref 13.0–17.0)
Potassium: 3.4 mEq/L — ABNORMAL LOW (ref 3.7–5.3)
Sodium: 140 mEq/L (ref 137–147)
TCO2: 23 mmol/L (ref 0–100)

## 2014-05-18 MED ORDER — SODIUM CHLORIDE 0.9 % IV SOLN
3.0000 g | Freq: Once | INTRAVENOUS | Status: AC
  Administered 2014-05-18: 3 g via INTRAVENOUS
  Filled 2014-05-18: qty 3

## 2014-05-18 MED ORDER — HYDROMORPHONE HCL PF 1 MG/ML IJ SOLN
0.5000 mg | Freq: Once | INTRAMUSCULAR | Status: AC
  Administered 2014-05-18: 0.5 mg via INTRAVENOUS
  Filled 2014-05-18: qty 1

## 2014-05-18 MED ORDER — AMOXICILLIN-POT CLAVULANATE 875-125 MG PO TABS: 1.0000 | ORAL_TABLET | Freq: Two times a day (BID) | ORAL | Status: DC

## 2014-05-18 MED ORDER — CLINDAMYCIN PHOSPHATE 600 MG/50ML IV SOLN: 600.0000 mg | Freq: Once | INTRAVENOUS | Status: DC

## 2014-05-18 MED ORDER — ACETAMINOPHEN 325 MG PO TABS
650.0000 mg | ORAL_TABLET | Freq: Once | ORAL | Status: AC
  Administered 2014-05-18: 650 mg via ORAL
  Filled 2014-05-18: qty 2

## 2014-05-18 NOTE — Discharge Instructions (Signed)
Take augmentin as prescribed until all gone. Follow up with an oral surgery.    Dental Abscess A dental abscess is a collection of infected fluid (pus) from a bacterial infection in the inner part of the tooth (pulp). It usually occurs at the end of the tooth's root.  CAUSES   Severe tooth decay.  Trauma to the tooth that allows bacteria to enter into the pulp, such as a broken or chipped tooth. SYMPTOMS   Severe pain in and around the infected tooth.  Swelling and redness around the abscessed tooth or in the mouth or face.  Tenderness.  Pus drainage.  Bad breath.  Bitter taste in the mouth.  Difficulty swallowing.  Difficulty opening the mouth.  Nausea.  Vomiting.  Chills.  Swollen neck glands. DIAGNOSIS   A medical and dental history will be taken.  An examination will be performed by tapping on the abscessed tooth.  X-rays may be taken of the tooth to identify the abscess. TREATMENT The goal of treatment is to eliminate the infection. You may be prescribed antibiotic medicine to stop the infection from spreading. A root canal may be performed to save the tooth. If the tooth cannot be saved, it may be pulled (extracted) and the abscess may be drained.  HOME CARE INSTRUCTIONS  Only take over-the-counter or prescription medicines for pain, fever, or discomfort as directed by your caregiver.  Rinse your mouth (gargle) often with salt water ( tsp salt in 8 oz [250 ml] of warm water) to relieve pain or swelling.  Do not drive after taking pain medicine (narcotics).  Do not apply heat to the outside of your face.  Return to your dentist for further treatment as directed. SEEK MEDICAL CARE IF:  Your pain is not helped by medicine.  Your pain is getting worse instead of better. SEEK IMMEDIATE MEDICAL CARE IF:  You have a fever or persistent symptoms for more than 2-3 days.  You have a fever and your symptoms suddenly get worse.  You have chills or a very  bad headache.  You have problems breathing or swallowing.  You have trouble opening your mouth.  You have swelling in the neck or around the eye. Document Released: 11/01/2005 Document Revised: 07/26/2012 Document Reviewed: 02/09/2011 [facility] Patient Information 2015 [place], [place]. This information is not intended to replace advice given to you by your health care provider. Make sure you discuss any questions you have with your health care provider.

## 2014-05-18 NOTE — ED Notes (Signed)
Pt c/o L sided jaw swelling onset yesterday with fever, chills and dizziness.

## 2014-05-18 NOTE — ED Provider Notes (Signed)
CSN: [id number]     Arrival date & time 05/18/14  0347 History   First MD Initiated Contact with Patient 05/18/14 0410     Chief Complaint  Patient presents with  . Fever  . Chills  . Facial Swelling     (Consider location/radiation/quality/duration/timing/severity/associated sxs/prior Treatment) HPI Jared [name] is a 48 y.o. male who presents to emergency department complaining of left facial pain, swelling, fever, chills. Patient states he has had a left lower toothache for several days, states was seen yesterday and prescribed antibiotics. He states he took first dose of penicillin yesterday. States went to bed and woke up in the middle of the night with fever, chills, worsening facial swelling. States was worried and came to emergency department. Pt denies any difficulty opening or closing his mouth. No injury. Nothing making his pain better or worse. No other complaints.   Past Medical History  Diagnosis Date  . Crohn disease 2008    with hospital admission in 2011, and 8/12 for flare up and SBO relieved with bowel rest. no suregery, no meds for CD  . Diabetes mellitus type II    Past Surgical History  Procedure Laterality Date  . 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 Scrotal surgery  ~ 1971    "took out a pellet"  . Laparotomy  01/25/2012    Procedure: EXPLORATORY LAPAROTOMY;  Surgeon: [name], MD;  Location: [facility];  Service: General;  Laterality: N/A;  . Bowel resection  01/25/2012    Procedure: SMALL BOWEL RESECTION;  Surgeon: [name], MD;  Location: [facility];  Service: General;  Laterality: N/A;   Family History  Problem Relation Age of Onset  . Cancer Mother     lung   History  Substance Use Topics  . Smoking status: Current Every Day Smoker -- 0.25 packs/day for 30 years    Types: Cigarettes  . Smokeless tobacco: Former Systems developer    Types: Chew    Quit date: 01/20/1997  . Alcohol Use: No    Review of Systems  Constitutional:  Positive for fever and chills.  HENT: Positive for dental problem and facial swelling. Negative for ear pain, sore throat and trouble swallowing.   Respiratory: Negative for cough, chest tightness and shortness of breath.   Cardiovascular: Negative for chest pain, palpitations and leg swelling.  Gastrointestinal: Negative for nausea, vomiting, abdominal pain, diarrhea and abdominal distention.  Musculoskeletal: Negative for arthralgias, myalgias, neck pain and neck stiffness.  Skin: Negative for rash.  Allergic/Immunologic: Negative for immunocompromised state.  Neurological: Positive for headaches. Negative for dizziness, weakness, light-headedness and numbness.      Allergies  Review of patient's allergies indicates no known allergies.  Home Medications   Prior to Admission medications   Medication Sig Start Date End Date Taking? Authorizing Provider  HYDROcodone-acetaminophen (NORCO/VICODIN) 5-325 MG per tablet Take 1 tablet by mouth every 6 (six) hours as needed for severe pain. 05/17/14  Yes Varney Biles, MD  penicillin v potassium (VEETID) 500 MG tablet Take 1 tablet (500 mg total) by mouth 4 (four) times daily. 05/17/14 05/24/14 Yes Ankit Nanavati, MD  glipiZIDE (GLUCOTROL) 10 MG tablet Take 1 tablet (10 mg total) by mouth daily before breakfast. 05/14/14   Theodis Blaze, MD  ibuprofen (ADVIL,MOTRIN) 600 MG tablet Take 1 tablet (600 mg total) by mouth every 6 (six) hours as needed. 05/17/14   Varney Biles, MD  ibuprofen (ADVIL,MOTRIN) 800 MG tablet Take 800  mg by mouth every 8 (eight) hours as needed for moderate pain.    Historical Provider, MD  ibuprofen (ADVIL,MOTRIN) 800 MG tablet Take 1 tablet (800 mg total) by mouth every 8 (eight) hours as needed. 05/17/14   Resa Miner Lawyer, PA-C  insulin aspart (NOVOLOG FLEXPEN) 100 UNIT/ML FlexPen Inject 40 Units into the skin daily at 12 noon. 05/14/14   Theodis Blaze, MD  metroNIDAZOLE (FLAGYL) 500 MG tablet Take 1 tablet (500 mg total) by  mouth 3 (three) times daily. 05/14/14   Theodis Blaze, MD  ondansetron (ZOFRAN) 4 MG tablet Take 1 tablet (4 mg total) by mouth every 6 (six) hours as needed for nausea. 05/14/14   Theodis Blaze, MD  oxyCODONE-acetaminophen (PERCOCET/ROXICET) 5-325 MG per tablet Take 1 tablet by mouth every 6 (six) hours as needed for severe pain. 05/17/14   Resa Miner Lawyer, PA-C  potassium chloride SA (K-DUR,KLOR-CON) 20 MEQ tablet Take 1 tablet (20 mEq total) by mouth daily. 05/14/14   Theodis Blaze, MD  predniSONE (DELTASONE) 10 MG tablet Take 50 mg tablet today and taper down by 5 mg daily until completed 05/14/14   Theodis Blaze, MD   BP 131/68  Pulse 100  Temp(Src) 100 F (37.8 C) (Oral)  Resp 18  Ht 6' 2"  (1.88 m)  Wt 187 lb (84.823 kg)  BMI 24.00 kg/m2  SpO2 97% Physical Exam  Nursing note and vitals reviewed. Constitutional: He appears well-developed and well-nourished. No distress.  HENT:  Head: Normocephalic and atraumatic.  Mouth/Throat: Oropharynx is clear and moist.  Left mandibular swelling. No trismus. No swelling under the tongue. Poor dentition with multiple cavities. Left lower second molar with large cavity, tender to palpation. No obvious abscess.  Eyes: Conjunctivae are normal.  Neck: Neck supple.  Cardiovascular: Normal rate, regular rhythm and normal heart sounds.   Pulmonary/Chest: Effort normal. No respiratory distress. He has no wheezes. He has no rales.  Musculoskeletal: He exhibits no edema.  Neurological: He is alert.  Skin: Skin is warm and dry.    ED Course  Procedures (including critical care time) Labs Review Labs Reviewed  CBC WITH DIFFERENTIAL - Abnormal; Notable for the following:    HCT 38.8 (*)    Monocytes Absolute 1.1 (*)    All other components within normal limits  CBG MONITORING, ED - Abnormal; Notable for the following:    Glucose-Capillary 185 (*)    All other components within normal limits  I-STAT CHEM 8, ED - Abnormal; Notable for the  following:    Potassium 3.4 (*)    BUN 3 (*)    Glucose, Bld 218 (*)    All other components within normal limits    Imaging Review No results found.   EKG Interpretation None      MDM   Final diagnoses:  Dental abscess    Patient dental abscess, now presents with a low-grade fever and worsening facial swelling. On exam no trismus, no swelling under the tongue, patient does have a temperature of 100. Tylenol given for the temperature, will get lab work, give Unasyn IV for the infection.  6:12 AM  Patient's lab work is unremarkable other than elevated glucose of 218, potassium slightly low at 3.4. White count is normal. There is no left shift. Patient given Unasyn for the infection IV. Discussed with Dr.Nanavati, will discharge home with Augmentin. Follow up with oral surgery.  Filed Vitals:   05/18/14 0353  BP: 131/68  Pulse: 100  Temp: 100 F (37.8 C)  TempSrc: Oral  Resp: 18  Height: 6' 2"  (1.88 m)  Weight: 187 lb (84.823 kg)  SpO2: 97%       Renold Genta, PA-C 05/19/14 0444

## 2014-05-18 NOTE — ED Notes (Signed)
Notified RN, Dub Mikes pt. CBG 185.

## 2014-05-22 NOTE — ED Provider Notes (Signed)
Medical screening examination/treatment/procedure(s) were performed by non-physician practitioner and as supervising physician I was immediately available for consultation/collaboration.   EKG Interpretation None       Varney Biles, MD 05/22/14 [id number]

## 2014-05-26 ENCOUNTER — Emergency Department (HOSPITAL_COMMUNITY): Payer: Self-pay

## 2014-05-26 ENCOUNTER — Encounter (HOSPITAL_COMMUNITY): Payer: Self-pay | Admitting: Emergency Medicine

## 2014-05-26 ENCOUNTER — Emergency Department (HOSPITAL_COMMUNITY)
Admission: EM | Admit: 2014-05-26 | Discharge: 2014-05-26 | Disposition: A | Discharge status: Home / Self Care | Payer: Self-pay | Attending: Emergency Medicine | Admitting: Emergency Medicine

## 2014-05-26 DIAGNOSIS — E119 Type 2 diabetes mellitus without complications: Secondary | ICD-10-CM | POA: Insufficient documentation

## 2014-05-26 DIAGNOSIS — Z79899 Other long term (current) drug therapy: Secondary | ICD-10-CM | POA: Insufficient documentation

## 2014-05-26 DIAGNOSIS — Z794 Long term (current) use of insulin: Secondary | ICD-10-CM | POA: Insufficient documentation

## 2014-05-26 DIAGNOSIS — K047 Periapical abscess without sinus: Secondary | ICD-10-CM | POA: Insufficient documentation

## 2014-05-26 DIAGNOSIS — Z8719 Personal history of other diseases of the digestive system: Secondary | ICD-10-CM | POA: Insufficient documentation

## 2014-05-26 DIAGNOSIS — Z792 Long term (current) use of antibiotics: Secondary | ICD-10-CM | POA: Insufficient documentation

## 2014-05-26 DIAGNOSIS — F17200 Nicotine dependence, unspecified, uncomplicated: Secondary | ICD-10-CM | POA: Insufficient documentation

## 2014-05-26 LAB — I-STAT CHEM 8, ED
BUN: 5 mg/dL — ABNORMAL LOW (ref 6–23)
Calcium, Ion: 1.15 mmol/L (ref 1.12–1.23)
Chloride: 98 mEq/L (ref 96–112)
Creatinine, Ser: 0.8 mg/dL (ref 0.50–1.35)
Glucose, Bld: 226 mg/dL — ABNORMAL HIGH (ref 70–99)
HCT: 48 % (ref 39.0–52.0)
Hemoglobin: 16.3 g/dL (ref 13.0–17.0)
Potassium: 4 mEq/L (ref 3.7–5.3)
Sodium: 136 mEq/L — ABNORMAL LOW (ref 137–147)
TCO2: 24 mmol/L (ref 0–100)

## 2014-05-26 LAB — CBC WITH DIFFERENTIAL/PLATELET
Basophils Absolute: 0 10*3/uL (ref 0.0–0.1)
Basophils Relative: 0 % (ref 0–1)
Eosinophils Absolute: 0 10*3/uL (ref 0.0–0.7)
Eosinophils Relative: 1 % (ref 0–5)
HCT: 43 % (ref 39.0–52.0)
Hemoglobin: 14.5 g/dL (ref 13.0–17.0)
Lymphocytes Relative: 22 % (ref 12–46)
Lymphs Abs: 1.6 10*3/uL (ref 0.7–4.0)
MCH: 28.2 pg (ref 26.0–34.0)
MCHC: 33.7 g/dL (ref 30.0–36.0)
MCV: 83.5 fL (ref 78.0–100.0)
Monocytes Absolute: 1 10*3/uL (ref 0.1–1.0)
Monocytes Relative: 13 % — ABNORMAL HIGH (ref 3–12)
Neutro Abs: 4.8 10*3/uL (ref 1.7–7.7)
Neutrophils Relative %: 64 % (ref 43–77)
Platelets: 298 10*3/uL (ref 150–400)
RBC: 5.15 MIL/uL (ref 4.22–5.81)
RDW: 14.2 % (ref 11.5–15.5)
WBC: 7.4 10*3/uL (ref 4.0–10.5)

## 2014-05-26 MED ORDER — CLINDAMYCIN PHOSPHATE 600 MG/50ML IV SOLN
600.0000 mg | Freq: Once | INTRAVENOUS | Status: AC
  Administered 2014-05-26: 600 mg via INTRAVENOUS
  Filled 2014-05-26: qty 50

## 2014-05-26 MED ORDER — IOHEXOL 300 MG/ML  SOLN
100.0000 mL | Freq: Once | INTRAMUSCULAR | Status: AC | PRN
  Administered 2014-05-26: 100 mL via INTRAVENOUS

## 2014-05-26 MED ORDER — CLINDAMYCIN HCL 150 MG PO CAPS: 150.0000 mg | ORAL_CAPSULE | Freq: Four times a day (QID) | ORAL | Status: DC

## 2014-05-26 MED ORDER — MORPHINE SULFATE 4 MG/ML IJ SOLN
6.0000 mg | Freq: Once | INTRAMUSCULAR | Status: AC
  Administered 2014-05-26: 6 mg via INTRAVENOUS
  Filled 2014-05-26: qty 2

## 2014-05-26 MED ORDER — OXYCODONE-ACETAMINOPHEN 5-325 MG PO TABS: 1.0000 | ORAL_TABLET | Freq: Four times a day (QID) | ORAL | Status: DC | PRN

## 2014-05-26 NOTE — Discharge Instructions (Signed)
Please call and follow up closely with oral surgeon Dr. Benson Norway tomorrow for further management of your condition.  Take antibiotic and pain medication as prescribed for the full duration.   Dental Abscess A dental abscess is a collection of infected fluid (pus) from a bacterial infection in the inner part of the tooth (pulp). It usually occurs at the end of the tooth's root.  CAUSES   Severe tooth decay.  Trauma to the tooth that allows bacteria to enter into the pulp, such as a broken or chipped tooth. SYMPTOMS   Severe pain in and around the infected tooth.  Swelling and redness around the abscessed tooth or in the mouth or face.  Tenderness.  Pus drainage.  Bad breath.  Bitter taste in the mouth.  Difficulty swallowing.  Difficulty opening the mouth.  Nausea.  Vomiting.  Chills.  Swollen neck glands. DIAGNOSIS   A medical and dental history will be taken.  An examination will be performed by tapping on the abscessed tooth.  X-rays may be taken of the tooth to identify the abscess. TREATMENT The goal of treatment is to eliminate the infection. You may be prescribed antibiotic medicine to stop the infection from spreading. A root canal may be performed to save the tooth. If the tooth cannot be saved, it may be pulled (extracted) and the abscess may be drained.  HOME CARE INSTRUCTIONS  Only take over-the-counter or prescription medicines for pain, fever, or discomfort as directed by your caregiver.  Rinse your mouth (gargle) often with salt water ( tsp salt in 8 oz [250 ml] of warm water) to relieve pain or swelling.  Do not drive after taking pain medicine (narcotics).  Do not apply heat to the outside of your face.  Return to your dentist for further treatment as directed. SEEK MEDICAL CARE IF:  Your pain is not helped by medicine.  Your pain is getting worse instead of better. SEEK IMMEDIATE MEDICAL CARE IF:  You have a fever or persistent symptoms for  more than 2-3 days.  You have a fever and your symptoms suddenly get worse.  You have chills or a very bad headache.  You have problems breathing or swallowing.  You have trouble opening your mouth.  You have swelling in the neck or around the eye. Document Released: 11/01/2005 Document Revised: 07/26/2012 Document Reviewed: 02/09/2011 [facility] Patient Information 2015 [place], [place]. This information is not intended to replace advice given to you by your health care provider. Make sure you discuss any questions you have with your health care provider.

## 2014-05-26 NOTE — ED Notes (Addendum)
Patient has left lower facial pain due to dental infection. Patient states the dentist will not see him until swelling decreases. Patient states the pain has increased to the left ear and left throat area. Patient states he has difficulty swallowing,

## 2014-05-26 NOTE — ED Provider Notes (Signed)
CSN: [id number]     Arrival date & time 05/26/14  1330 History   First MD Initiated Contact with Patient 05/26/14 1345     Chief Complaint  Patient presents with  . Facial Swelling  . Dental Pain     (Consider location/radiation/quality/duration/timing/severity/associated sxs/prior Treatment) HPI  48 year old male with history of insulin-dependent diabetes, Crohn's disease presents for evaluation of dental infection. Patient report he has been dealing with pain to left lower jaw ongoing for the past 1-2 weeks.  Describe pain as a sharp and aching sensation, radiates to his entire jaw, with increased swelling to his jaw and adjacent face. Pain is worsened with chewing and eating and also with swallowing. He has been seen in the ED 3 separate times within the same time frame, was given several different antibiotics however it has not provide any relief.  abx includes PCN, augmentin, and IV Unasyn.  He was discharged home with abx and pain medication which he has been taking as prescribed.  He has also tried to f/u with dentist but sts they were on vacation and cannot see him.  He returns because pain is worsen and now having throat discomfort.  Denies fever, chills, ear pain, cp, sob, productive cough or rash.  Pt is a smoker.    Past Medical History  Diagnosis Date  . Crohn disease 2008    with hospital admission in 2011, and 8/12 for flare up and SBO relieved with bowel rest. no suregery, no meds for CD  . Diabetes mellitus type II    Past Surgical History  Procedure Laterality Date  . 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 Scrotal surgery  ~ 1971    "took out a pellet"  . Laparotomy  01/25/2012    Procedure: EXPLORATORY LAPAROTOMY;  Surgeon: [name], MD;  Location: [place];  Service: General;  Laterality: N/A;  . Bowel resection  01/25/2012    Procedure: SMALL BOWEL RESECTION;  Surgeon: [name], MD;  Location: [facility];  Service: General;  Laterality:  N/A;   Family History  Problem Relation Age of Onset  . Cancer Mother     lung   History  Substance Use Topics  . Smoking status: Current Every Day Smoker -- 0.25 packs/day for 30 years    Types: Cigarettes  . Smokeless tobacco: Former Systems developer    Types: Chew    Quit date: 01/20/1997  . Alcohol Use: No    Review of Systems  Constitutional: Negative for fever.  HENT: Positive for dental problem and facial swelling.   Skin: Negative for rash and wound.      Allergies  Review of patient's allergies indicates no known allergies.  Home Medications   Prior to Admission medications   Medication Sig Start Date End Date Taking? Authorizing Provider  amoxicillin-clavulanate (AUGMENTIN) 875-125 MG per tablet Take 1 tablet by mouth every 12 (twelve) hours. 05/18/14   Tatyana A Kirichenko, PA-C  glipiZIDE (GLUCOTROL) 10 MG tablet Take 1 tablet (10 mg total) by mouth daily before breakfast. 05/14/14   Theodis Blaze, MD  insulin aspart (NOVOLOG FLEXPEN) 100 UNIT/ML FlexPen Inject 40 Units into the skin daily at 12 noon. 05/14/14   Theodis Blaze, MD   BP 139/79  Pulse 106  Temp(Src) 98.7 F (37.1 C)  Resp 20  SpO2 99% Physical Exam  Constitutional: He appears well-developed and well-nourished. No distress.  HENT:  Head: Atraumatic.  Pt with poor dentition  and dental decay noted to left lower jaw.  Jaw line is tender to percussion.  Edema noted to buccal region adjacent to jaw.  Facial swelling noted.  No trismus.  No swelling under tongue.    Eyes: Conjunctivae are normal.  Neck: Normal range of motion. Neck supple.  Cardiovascular:  Tachycardia without M/R/G  Lymphadenopathy:    He has cervical adenopathy.  Neurological: He is alert.  Skin: No rash noted.  Psychiatric: He has a normal mood and affect.    ED Course  Procedures (including critical care time)  2:31 PM Patient here with progressive worsening pain and swelling to his teeth and gumline suspicious of periapical  abscess with facial involvement. Although he is afebrile and without any obvious airway compromise, given his history of diabetes, and infection unrelieved with antibiotic, CT scan ordered to rule out deep tissue infection. IV abx started.  Care discussed with Dr. Mingo Amber.  4:16 PM Maxillofacial CT demonstrates odontogenic infection without obvious abscess.  Will have pt take clindamycin, pain medication and f/u with dentist or oral surgeon for further care.  Airway is intact, stable for discharge.    Labs Review Labs Reviewed  CBC WITH DIFFERENTIAL - Abnormal; Notable for the following:    Monocytes Relative 13 (*)    All other components within normal limits  I-STAT CHEM 8, ED - Abnormal; Notable for the following:    Sodium 136 (*)    BUN 5 (*)    Glucose, Bld 226 (*)    All other components within normal limits    Imaging Review Ct Maxillofacial W/cm  05/26/2014   CLINICAL DATA:  Dental abscess.  EXAM: CT MAXILLOFACIAL WITH CONTRAST  TECHNIQUE: Multidetector CT imaging of the maxillofacial structures was performed with intravenous contrast. Multiplanar CT image reconstructions were also generated. A small metallic BB was placed on the right temple in order to reliably differentiate right from left.  CONTRAST:  134m OMNIPAQUE IOHEXOL 300 MG/ML  SOLN  COMPARISON:  None.  FINDINGS: The patient's posterior most left mandibular molar contains a large cavity along its lateral aspect and there are periapical lucencies involving the roots of this tooth. Soft tissue windows demonstrate a focal fluid collection adjacent to the lateral cortex of the posterior left mandible that measures approximately 3.2 x 1.2 cm. There is no peripheral rim enhancement about the fluid collection at this time. Lateral to the focal fluid collection, there is asymmetric thickening/swelling of the overlying soft tissues of the left face.  The soft tissues medial to the left mandible show no definite fluid collection or  abscess. The imaged prevertebral soft tissues are normal.  There is a cavity and a periapical lucency involving the left maxillary tooth #11. There is a cavity involving the right maxillary tooth #6.  There is streak artifact from multiple dental fillings.  Reactive size submental lymph nodes are noted.  The jugular veins and carotid arteries are patent bilaterally. Normal epiglottis. Visualized airway is patent.  Negative for facial bone fracture.  The visualized paranasal sinuses and mastoid air cells are clear.  IMPRESSION: 1. Cavity and periapical lucencies involve the posterior most left mandibular molar. There is an associated odontogenic infection evidenced by a focal fluid collection (3.2 x 1.2 cm) adjacent to the lateral cortex of the left mandible at the level of this tooth and focal soft tissue swelling of the left face at the level of the mandible. 2. Cavities involving teeth #6 and #11.   Electronically Signed   By:  Curlene Dolphin M.D.   On: 05/26/2014 16:11     EKG Interpretation None      MDM   Final diagnoses:  Periapical abscess with facial involvement    BP 139/79  Pulse 106  Temp(Src) 98.7 F (37.1 C)  Resp 20  SpO2 99%  I have reviewed nursing notes and vital signs. I personally reviewed the imaging tests through PACS system  I reviewed available ER/hospitalization records thought the EMR     Domenic Moras, [place] 05/26/14 1618

## 2014-05-26 NOTE — ED Notes (Signed)
Persistent pain and swelling in l/jaw due to dental infection

## 2014-05-27 NOTE — ED Provider Notes (Signed)
Medical screening examination/treatment/procedure(s) were performed by non-physician practitioner and as supervising physician I was immediately available for consultation/collaboration.   EKG Interpretation None        Osvaldo Shipper, MD 05/27/14 [id number]

## 2014-06-27 ENCOUNTER — Ambulatory Visit (INDEPENDENT_AMBULATORY_CARE_PROVIDER_SITE_OTHER): Payer: Self-pay | Admitting: Internal Medicine

## 2014-06-27 ENCOUNTER — Encounter: Payer: Self-pay | Admitting: Internal Medicine

## 2014-06-27 VITALS — BP 134/76 | HR 79 | Temp 98.1°F | Resp 16 | Ht 74.0 in | Wt 208.0 lb

## 2014-06-27 DIAGNOSIS — Z23 Encounter for immunization: Secondary | ICD-10-CM

## 2014-06-27 DIAGNOSIS — F17200 Nicotine dependence, unspecified, uncomplicated: Secondary | ICD-10-CM

## 2014-06-27 DIAGNOSIS — K50918 Crohn's disease, unspecified, with other complication: Secondary | ICD-10-CM

## 2014-06-27 DIAGNOSIS — IMO0001 Reserved for inherently not codable concepts without codable children: Secondary | ICD-10-CM

## 2014-06-27 DIAGNOSIS — E1165 Type 2 diabetes mellitus with hyperglycemia: Principal | ICD-10-CM

## 2014-06-27 DIAGNOSIS — K5090 Crohn's disease, unspecified, without complications: Secondary | ICD-10-CM

## 2014-06-28 LAB — COMPREHENSIVE METABOLIC PANEL
ALT: 25 U/L (ref 0–53)
AST: 15 U/L (ref 0–37)
Albumin: 4 g/dL (ref 3.5–5.2)
Alkaline Phosphatase: 82 U/L (ref 39–117)
BUN: 7 mg/dL (ref 6–23)
CO2: 26 mEq/L (ref 19–32)
Calcium: 9.1 mg/dL (ref 8.4–10.5)
Chloride: 102 mEq/L (ref 96–112)
Creat: 0.86 mg/dL (ref 0.50–1.35)
Glucose, Bld: 178 mg/dL — ABNORMAL HIGH (ref 70–99)
Potassium: 3.8 mEq/L (ref 3.5–5.3)
Sodium: 138 mEq/L (ref 135–145)
Total Bilirubin: 0.4 mg/dL (ref 0.2–1.2)
Total Protein: 6.8 g/dL (ref 6.0–8.3)

## 2014-06-28 LAB — LIPID PANEL
Cholesterol: 160 mg/dL (ref 0–200)
HDL: 39 mg/dL — ABNORMAL LOW (ref >39)
LDL Cholesterol: 83 mg/dL (ref 0–99)
Total CHOL/HDL Ratio: 4.1 Ratio
Triglycerides: 192 mg/dL — ABNORMAL HIGH (ref <150)
VLDL: 38 mg/dL (ref 0–40)

## 2014-06-28 LAB — SEDIMENTATION RATE: Sed Rate: 36 mm/hr — ABNORMAL HIGH (ref 0–16)

## 2014-06-28 LAB — MICROALBUMIN, URINE: Microalb, Ur: 1.15 mg/dL (ref 0.00–1.89)

## 2014-06-30 ENCOUNTER — Other Ambulatory Visit: Payer: Self-pay | Admitting: Internal Medicine

## 2014-06-30 DIAGNOSIS — E1165 Type 2 diabetes mellitus with hyperglycemia: Principal | ICD-10-CM

## 2014-06-30 DIAGNOSIS — IMO0001 Reserved for inherently not codable concepts without codable children: Secondary | ICD-10-CM

## 2014-06-30 DIAGNOSIS — E119 Type 2 diabetes mellitus without complications: Secondary | ICD-10-CM | POA: Insufficient documentation

## 2014-06-30 DIAGNOSIS — Z720 Tobacco use: Secondary | ICD-10-CM | POA: Insufficient documentation

## 2014-06-30 DIAGNOSIS — K5090 Crohn's disease, unspecified, without complications: Secondary | ICD-10-CM | POA: Insufficient documentation

## 2014-06-30 DIAGNOSIS — F17200 Nicotine dependence, unspecified, uncomplicated: Secondary | ICD-10-CM | POA: Insufficient documentation

## 2014-06-30 MED ORDER — METFORMIN HCL 500 MG PO TABS: 500.0000 mg | ORAL_TABLET | Freq: Two times a day (BID) | ORAL | Status: DC

## 2014-06-30 NOTE — Progress Notes (Signed)
Reviewed labs. Renal function normal so will start patient on Glucophage 500 mg BID.

## 2014-06-30 NOTE — Progress Notes (Signed)
Patient ID: Jared [name], male   DOB: 11/14/1966, 48 y.o.   MRN: [id number]   Jared [name], is a 48 y.o. male  CSN:[id number]  MRN:[id number]  DOB - 11/25/1965  CC:  Chief Complaint  Patient presents with  . Establish Care       HPI: Jared [name] is a 48 y.o. male here today to establish medical care. He reports a h/o Crohn's disease which has previously been managed by Dr. Hayes at [facility]. He also reports a h/o small bowel resection ( review of records show Ileocecectomy in 2013) due to SBO secondary to strictures associated with Crohn's disease. He has had no consistent care in the last 2 years because of loss of medical insurance. He was hospitalized in 04/2014 on Flagyl and a prednisone taper for treatment of crohn's exacerbation. He reports some tenderness in the RUQ and RLQ of abdomen which he reports is a usual signal of impending exacerbation of his disease. He has had no fevers, chills or diarrhea.   He also has a diagnosis of DM II but has not been on any medications. He was discharged from the hospital on Glipizide and Insulin but has not been able to afford them. He has been modulating his diet and denies any polyuria, polydipsia or polyphagia.  Patient has No headache, No chest pain, No abdominal pain - No Nausea, No new weakness tingling or numbness, No Cough - SOB.  No Known Allergies Past Medical History  Diagnosis Date  . Crohn disease 2008    with hospital admission in 2011, and 8/12 for flare up and SBO relieved with bowel rest. no suregery, no meds for CD  . Diabetes mellitus type II    Current Outpatient Prescriptions on File Prior to Visit  Medication Sig Dispense Refill  . [DISCONTINUED] furosemide (LASIX) 10 MG/ML solution Take by mouth daily.      . [DISCONTINUED] mesalamine (PENTASA) 250 MG CR capsule Take 1,000 mg by mouth 4 (four) times daily.      . [DISCONTINUED] pioglitazone (ACTOS) 15 MG tablet Take by mouth daily.       No current  facility-administered medications on file prior to visit.   Family History  Problem Relation Age of Onset  . Cancer Mother     lung  . Diabetes Mother   . Alopecia Mother   . Coronary artery disease Mother   . Diabetes Sister   . Hyperlipidemia Sister   . Hypertension Sister   . Angina Brother   . Hepatitis Brother   . Alcohol abuse Brother   . Diabetes Brother    History   Social History  . Marital Status: Single    Spouse Name: N/A    Number of Children: N/A  . Years of Education: N/A   Occupational History  . Not on file.   Social History Main Topics  . Smoking status: Current Every Day Smoker -- 0.25 packs/day for 30 years    Types: Cigarettes  . Smokeless tobacco: Former User    Types: Chew    Quit date: 01/20/1997  . Alcohol Use: 0.6 oz/week    1 Cans of beer per week  . Drug Use: No  . Sexual Activity: Not Currently   Other Topics Concern  . Not on file   Social History Narrative   Lives in [place].Is single. Has a sister in [place]. Smokes 2-3 cigarettes a day. Drinks beer once every few months. No history of drug abuse. Studied up to 12th grade.   Has orange card and [facility] . No health insurance. 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    Review of Systems: Constitutional: Negative for fever, chills, diaphoresis, activity change, appetite change and fatigue. HENT: Negative for ear pain, nosebleeds, congestion, facial swelling, rhinorrhea, neck pain, neck stiffness and ear discharge.  Eyes: Negative for pain, discharge, redness, itching and visual disturbance. Respiratory: Negative for cough, choking, chest tightness, shortness of breath, wheezing and stridor.  Cardiovascular: Negative for chest pain, palpitations and leg swelling. Gastrointestinal: Negative for abdominal distention. Genitourinary: Negative for dysuria, urgency, frequency, hematuria, flank pain, decreased urine volume, difficulty urinating and  dyspareunia.  Musculoskeletal: Negative for back pain, joint swelling, arthralgia and gait problem. Neurological: Negative for dizziness, tremors, seizures, syncope, facial asymmetry, speech difficulty, weakness, light-headedness, numbness and headaches.  Hematological: Negative for adenopathy. Does not bruise/bleed easily. Psychiatric/Behavioral: Negative for hallucinations, behavioral problems, confusion, dysphoric mood, decreased concentration and agitation.    Objective:     Filed Vitals:   06/27/14 1329  BP: 134/76  Pulse: 79  Temp: 98.1 F (36.7 C)  Resp: 16    Physical Exam: Constitutional: Patient appears well-developed and well-nourished. No distress. HENT: Normocephalic, atraumatic, External right and left ear normal. Oropharynx is clear and moist.  Eyes: Conjunctivae and EOM are normal. PERRLA, no scleral icterus. Neck: Normal ROM. Neck supple. No JVD. No tracheal deviation. No thyromegaly. CVS: RRR, S1/S2 +, no murmurs, no gallops, no carotid bruit.  Pulmonary: Effort and breath sounds normal, no stridor, rhonchi, wheezes, rales.  Abdominal: Soft. BS +, no distension, rebound or guarding. Pt has mild tenderness on palpation of the RUQ and RLQ of abdomen. Musculoskeletal: Normal range of motion. No edema and no tenderness.  Lymphadenopathy: No lymphadenopathy noted, cervical, inguinal or axillary Neuro: Alert. Normal reflexes, muscle tone coordination. No cranial nerve deficit. Skin: Skin is warm and dry. No rash noted. Not diaphoretic. No erythema. No pallor. Psychiatric: Normal mood and affect. Behavior, judgment, thought content normal.  Lab Results  Component Value Date   WBC 7.4 05/26/2014   HGB 16.3 05/26/2014   HCT 48.0 05/26/2014   MCV 83.5 05/26/2014   PLT 298 05/26/2014   Lab Results  Component Value Date   CREATININE 0.86 06/27/2014   BUN 7 06/27/2014   NA 138 06/27/2014   K 3.8 06/27/2014   CL 102 06/27/2014   CO2 26 06/27/2014    Lab Results   Component Value Date   HGBA1C 12.4* 05/11/2014   Lipid Panel     Component Value Date/Time   CHOL 160 06/27/2014 1442   TRIG 192* 06/27/2014 1442   HDL 39* 06/27/2014 1442   CHOLHDL 4.1 06/27/2014 1442   VLDL 38 06/27/2014 1442   LDLCALC 83 06/27/2014 1442       Assessment and plan:   1. Type II or unspecified type diabetes mellitus without mention of complication, uncontrolled - Review of labs show Hb A1c of 12. He is currently fasting and I will check labs. I will likely start on Metformin if renal function normal.  - Comprehensive metabolic panel - Microalbumin, urine - Lipid panel  2. Crohn's disease, other complication - Pt not clinically in an active exacerbation of Crohn's disease. Will check ESR and refer to GI. - Sedimentation Rate - ambulatory referral to Gastroenterology  3. Tobacco use disorder - Pt cutting down. Not yet ready to quit  4. Need for Tdap vaccination - Tdap vaccine greater than or equal to 7yo IM    5. Immunization due - Pneumococcal polysaccharide vaccine 23-valent greater than or equal to 2yo subcutaneous/IM   Return in about 1 month (around 07/28/2014) for DM II.  The patient was given clear instructions to go to ER or return to medical center if symptoms don't improve, worsen or new problems develop. The patient verbalized understanding. The patient was told to [name] to get lab results if they haven't heard anything in the next week.     This note has been created with Surveyor, quantity. Any transcriptional errors are unintentional.    Luv Mish A., MD [place], [place]   06/30/2014, 3:45 PM

## 2014-08-01 ENCOUNTER — Ambulatory Visit (INDEPENDENT_AMBULATORY_CARE_PROVIDER_SITE_OTHER): Payer: Self-pay | Admitting: Family Medicine

## 2014-08-01 VITALS — BP 120/66 | HR 77 | Temp 98.2°F | Resp 14 | Ht 74.0 in | Wt 215.0 lb

## 2014-08-01 DIAGNOSIS — E1149 Type 2 diabetes mellitus with other diabetic neurological complication: Secondary | ICD-10-CM

## 2014-08-01 DIAGNOSIS — F17200 Nicotine dependence, unspecified, uncomplicated: Secondary | ICD-10-CM

## 2014-08-01 DIAGNOSIS — E1142 Type 2 diabetes mellitus with diabetic polyneuropathy: Secondary | ICD-10-CM

## 2014-08-01 DIAGNOSIS — IMO0001 Reserved for inherently not codable concepts without codable children: Secondary | ICD-10-CM

## 2014-08-01 DIAGNOSIS — K5090 Crohn's disease, unspecified, without complications: Secondary | ICD-10-CM

## 2014-08-01 DIAGNOSIS — E1140 Type 2 diabetes mellitus with diabetic neuropathy, unspecified: Secondary | ICD-10-CM

## 2014-08-01 DIAGNOSIS — E1165 Type 2 diabetes mellitus with hyperglycemia: Principal | ICD-10-CM

## 2014-08-01 LAB — BASIC METABOLIC PANEL
BUN: 13 mg/dL (ref 6–23)
CO2: 28 mEq/L (ref 19–32)
Calcium: 9.6 mg/dL (ref 8.4–10.5)
Chloride: 101 mEq/L (ref 96–112)
Creat: 1.07 mg/dL (ref 0.50–1.35)
Glucose, Bld: 314 mg/dL — ABNORMAL HIGH (ref 70–99)
Potassium: 4.2 mEq/L (ref 3.5–5.3)
Sodium: 138 mEq/L (ref 135–145)

## 2014-08-01 NOTE — Patient Instructions (Signed)
Diabetes and Foot Care Diabetes may cause you to have problems because of poor blood supply (circulation) to your feet and legs. This may cause the skin on your feet to become thinner, break easier, and heal more slowly. Your skin may become dry, and the skin may peel and crack. You may also have nerve damage in your legs and feet causing decreased feeling in them. You may not notice minor injuries to your feet that could lead to infections or more serious problems. Taking care of your feet is one of the most important things you can do for yourself.  HOME CARE INSTRUCTIONS  Wear shoes at all times, even in the house. Do not go barefoot. Bare feet are easily injured.  Check your feet daily for blisters, cuts, and redness. If you cannot see the bottom of your feet, use a mirror or ask someone for help.  Wash your feet with warm water (do not use hot water) and mild soap. Then pat your feet and the areas between your toes until they are completely dry. Do not soak your feet as this can dry your skin.  Apply a moisturizing lotion or petroleum jelly (that does not contain alcohol and is unscented) to the skin on your feet and to dry, brittle toenails. Do not apply lotion between your toes.  Trim your toenails straight across. Do not dig under them or around the cuticle. File the edges of your nails with an emery board or nail file.  Do not cut corns or calluses or try to remove them with medicine.  Wear clean socks or stockings every day. Make sure they are not too tight. Do not wear knee-high stockings since they may decrease blood flow to your legs.  Wear shoes that fit properly and have enough cushioning. To break in new shoes, wear them for just a few hours a day. This prevents you from injuring your feet. Always look in your shoes before you put them on to be sure there are no objects inside.  Do not cross your legs. This may decrease the blood flow to your feet.  If you find a minor scrape,  cut, or break in the skin on your feet, keep it and the skin around it clean and dry. These areas may be cleansed with mild soap and water. Do not cleanse the area with peroxide, alcohol, or iodine.  When you remove an adhesive bandage, be sure not to damage the skin around it.  If you have a wound, look at it several times a day to make sure it is healing.  Do not use heating pads or hot water bottles. They may burn your skin. If you have lost feeling in your feet or legs, you may not know it is happening until it is too late.  Make sure your health care provider performs a complete foot exam at least annually or more often if you have foot problems. Report any cuts, sores, or bruises to your health care provider immediately. SEEK MEDICAL CARE IF:   You have an injury that is not healing.  You have cuts or breaks in the skin.  You have an ingrown nail.  You notice redness on your legs or feet.  You feel burning or tingling in your legs or feet.  You have pain or cramps in your legs and feet.  Your legs or feet are numb.  Your feet always feel cold. SEEK IMMEDIATE MEDICAL CARE IF:   There is increasing redness,  swelling, or pain in or around a wound.  There is a red line that goes up your leg.  Pus is coming from a wound.  You develop a fever or as directed by your health care provider.  You notice a bad smell coming from an ulcer or wound. Document Released: 10/29/2000 Document Revised: 07/04/2013 Document Reviewed: 04/10/2013 [facility] Patient Information 2015 [place], [place]. This information is not intended to replace advice given to you by your health care provider. Make sure you discuss any questions you have with your health care provider. Diabetes Mellitus and Food It is important for you to manage your blood sugar (glucose) level. Your blood glucose level can be greatly affected by what you eat. Eating healthier foods in the appropriate amounts throughout the day at  about the same time each day will help you control your blood glucose level. It can also help slow or prevent worsening of your diabetes mellitus. Healthy eating may even help you improve the level of your blood pressure and reach or maintain a healthy weight.  HOW CAN FOOD AFFECT ME? Carbohydrates Carbohydrates affect your blood glucose level more than any other type of food. Your dietitian will help you determine how many carbohydrates to eat at each meal and teach you how to count carbohydrates. Counting carbohydrates is important to keep your blood glucose at a healthy level, especially if you are using insulin or taking certain medicines for diabetes mellitus. Alcohol Alcohol can cause sudden decreases in blood glucose (hypoglycemia), especially if you use insulin or take certain medicines for diabetes mellitus. Hypoglycemia can be a life-threatening condition. Symptoms of hypoglycemia (sleepiness, dizziness, and disorientation) are similar to symptoms of having too much alcohol.  If your health care provider has given you approval to drink alcohol, do so in moderation and use the following guidelines:  Women should not have more than one drink per day, and men should not have more than two drinks per day. One drink is equal to:  12 oz of beer.  5 oz of wine.  1 oz of hard liquor.  Do not drink on an empty stomach.  Keep yourself hydrated. Have water, diet soda, or unsweetened iced tea.  Regular soda, juice, and other mixers might contain a lot of carbohydrates and should be counted. WHAT FOODS ARE NOT RECOMMENDED? As you make food choices, it is important to remember that all foods are not the same. Some foods have fewer nutrients per serving than other foods, even though they might have the same number of calories or carbohydrates. It is difficult to get your body what it needs when you eat foods with fewer nutrients. Examples of foods that you should avoid that are high in calories and  carbohydrates but low in nutrients include:  Trans fats (most processed foods list trans fats on the Nutrition Facts label).  Regular soda.  Juice.  Candy.  Sweets, such as cake, pie, doughnuts, and cookies.  Fried foods. WHAT FOODS CAN I EAT? Have nutrient-rich foods, which will nourish your body and keep you healthy. The food you should eat also will depend on several factors, including:  The calories you need.  The medicines you take.  Your weight.  Your blood glucose level.  Your blood pressure level.  Your cholesterol level. You also should eat a variety of foods, including:  Protein, such as meat, poultry, fish, tofu, nuts, and seeds (lean animal proteins are best).  Fruits.  Vegetables.  Dairy products, such as milk, cheese,  and yogurt (low fat is best).  Breads, grains, pasta, cereal, rice, and beans.  Fats such as olive oil, trans fat-free margarine, canola oil, avocado, and olives. DOES EVERYONE WITH DIABETES MELLITUS HAVE THE SAME MEAL PLAN? Because every person with diabetes mellitus is different, there is not one meal plan that works for everyone. It is very important that you meet with a dietitian who will help you create a meal plan that is just right for you. Document Released: 07/29/2005 Document Revised: 11/06/2013 Document Reviewed: 09/28/2013 [facility] Patient Information 2015 [place], [place]. This information is not intended to replace advice given to you by your health care provider. Make sure you discuss any questions you have with your health care provider. Type 2 Diabetes Mellitus Type 2 diabetes mellitus, often simply referred to as type 2 diabetes, is a long-lasting (chronic) disease. In type 2 diabetes, the pancreas does not make enough insulin (a hormone), the cells are less responsive to the insulin that is made (insulin resistance), or both. Normally, insulin moves sugars from food into the tissue cells. The tissue cells use the sugars for  energy. The lack of insulin or the lack of normal response to insulin causes excess sugars to build up in the blood instead of going into the tissue cells. As a result, high blood sugar (hyperglycemia) develops. The effect of high sugar (glucose) levels can cause many complications. Type 2 diabetes was also previously called adult-onset diabetes, but it can occur at any age.  RISK FACTORS  A person is predisposed to developing type 2 diabetes if someone in the family has the disease and also has one or more of the following primary risk factors:  Overweight.  An inactive lifestyle.  A history of consistently eating high-calorie foods. Maintaining a normal weight and regular physical activity can reduce the chance of developing type 2 diabetes. SYMPTOMS  A person with type 2 diabetes may not show symptoms initially. The symptoms of type 2 diabetes appear slowly. The symptoms include:  Increased thirst (polydipsia).  Increased urination (polyuria).  Increased urination during the night (nocturia).  Weight loss. This weight loss may be rapid.  Frequent, recurring infections.  Tiredness (fatigue).  Weakness.  Vision changes, such as blurred vision.  Fruity smell to your breath.  Abdominal pain.  Nausea or vomiting.  Cuts or bruises which are slow to heal.  Tingling or numbness in the hands or feet. DIAGNOSIS Type 2 diabetes is frequently not diagnosed until complications of diabetes are present. Type 2 diabetes is diagnosed when symptoms or complications are present and when blood glucose levels are increased. Your blood glucose level may be checked by one or more of the following blood tests:  A fasting blood glucose test. You will not be allowed to eat for at least 8 hours before a blood sample is taken.  A random blood glucose test. Your blood glucose is checked at any time of the day regardless of when you ate.  A hemoglobin A1c blood glucose test. A hemoglobin A1c  test provides information about blood glucose control over the previous 3 months.  An oral glucose tolerance test (OGTT). Your blood glucose is measured after you have not eaten (fasted) for 2 hours and then after you drink a glucose-containing beverage. TREATMENT   You may need to take insulin or diabetes medicine daily to keep blood glucose levels in the desired range.  If you use insulin, you may need to adjust the dosage depending on the carbohydrates that you  eat with each meal or snack. The treatment goal is to maintain the before meal blood sugar (preprandial glucose) level at 70-130 mg/dL. HOME CARE INSTRUCTIONS   Have your hemoglobin A1c level checked twice a year.  Perform daily blood glucose monitoring as directed by your health care provider.  Monitor urine ketones when you are ill and as directed by your health care provider.  Take your diabetes medicine or insulin as directed by your health care provider to maintain your blood glucose levels in the desired range.  Never run out of diabetes medicine or insulin. It is needed every day.  If you are using insulin, you may need to adjust the amount of insulin given based on your intake of carbohydrates. Carbohydrates can raise blood glucose levels but need to be included in your diet. Carbohydrates provide vitamins, minerals, and fiber which are an essential part of a healthy diet. Carbohydrates are found in fruits, vegetables, whole grains, dairy products, legumes, and foods containing added sugars.  Eat healthy foods. You should make an appointment to see a registered dietitian to help you create an eating plan that is right for you.  Lose weight if you are overweight.  Carry a medical alert card or wear your medical alert jewelry.  Carry a 15-gram carbohydrate snack with you at all times to treat low blood glucose (hypoglycemia). Some examples of 15-gram carbohydrate snacks include:  Glucose tablets, 3 or 4.  Glucose gel,  15-gram tube.  Raisins, 2 tablespoons (24 grams).  Jelly beans, 6.  Animal crackers, 8.  Regular pop, 4 ounces (120 mL).  Gummy treats, 9.  Recognize hypoglycemia. Hypoglycemia occurs with blood glucose levels of 70 mg/dL and below. The risk for hypoglycemia increases when fasting or skipping meals, during or after intense exercise, and during sleep. Hypoglycemia symptoms can include:  Tremors or shakes.  Decreased ability to concentrate.  Sweating.  Increased heart rate.  Headache.  Dry mouth.  Hunger.  Irritability.  Anxiety.  Restless sleep.  Altered speech or coordination.  Confusion.  Treat hypoglycemia promptly. If you are alert and able to safely swallow, follow the 15:15 rule:  Take 15-20 grams of rapid-acting glucose or carbohydrate. Rapid-acting options include glucose gel, glucose tablets, or 4 ounces (120 mL) of fruit juice, regular soda, or low-fat milk.  Check your blood glucose level 15 minutes after taking the glucose.  Take 15-20 grams more of glucose if the repeat blood glucose level is still 70 mg/dL or below.  Eat a meal or snack within 1 hour once blood glucose levels return to normal.  Be alert to feeling very thirsty and urinating more frequently than usual, which are early signs of hyperglycemia. An early awareness of hyperglycemia allows for prompt treatment. Treat hyperglycemia as directed by your health care provider.  Engage in at least 150 minutes of moderate-intensity physical activity a week, spread over at least 3 days of the week or as directed by your health care provider. In addition, you should engage in resistance exercise at least 2 times a week or as directed by your health care provider. Try to spend no more than 90 minutes at one time inactive.  Adjust your medicine and food intake as needed if you start a new exercise or sport.  Follow your sick-day plan anytime you are unable to eat or drink as usual.  Do not use any  tobacco products including cigarettes, chewing tobacco, or electronic cigarettes. If you need help quitting, ask your health care provider.  Limit alcohol intake to no more than 1 drink per day for nonpregnant women and 2 drinks per day for men. You should drink alcohol only when you are also eating food. Talk with your health care provider whether alcohol is safe for you. Tell your health care provider if you drink alcohol several times a week.  Keep all follow-up visits as directed by your health care provider. This is important.  Schedule an eye exam soon after the diagnosis of type 2 diabetes and then annually.  Perform daily skin and foot care. Examine your skin and feet daily for cuts, bruises, redness, nail problems, bleeding, blisters, or sores. A foot exam by a health care provider should be done annually.  Brush your teeth and gums at least twice a day and floss at least once a day. Follow up with your dentist regularly.  Share your diabetes management plan with your workplace or school.  Stay up-to-date with immunizations. It is recommended that people with diabetes who are over 41 years old get the pneumonia vaccine. In some cases, two separate shots may be given. Ask your health care provider if your pneumonia vaccination is up-to-date.  Learn to manage stress.  Obtain ongoing diabetes education and support as needed.  Participate in or seek rehabilitation as needed to maintain or improve independence and quality of life. Request a physical or occupational therapy referral if you are having foot or hand numbness, or difficulties with grooming, dressing, eating, or physical activity. SEEK MEDICAL CARE IF:   You are unable to eat food or drink fluids for more than 6 hours.  You have nausea and vomiting for more than 6 hours.  Your blood glucose level is over 240 mg/dL.  There is a change in mental status.  You develop an additional serious illness.  You have diarrhea for  more than 6 hours.  You have been sick or have had a fever for a couple of days and are not getting better.  You have pain during any physical activity.  SEEK IMMEDIATE MEDICAL CARE IF:  You have difficulty breathing.  You have moderate to large ketone levels. MAKE SURE YOU:  Understand these instructions.  Will watch your condition.  Will get help right away if you are not doing well or get worse. Document Released: 11/01/2005 Document Revised: 03/18/2014 Document Reviewed: 05/30/2012 [facility] Patient Information 2015 [place], [place]. This information is not intended to replace advice given to you by your health care provider. Make sure you discuss any questions you have with your health care provider.

## 2014-08-01 NOTE — Progress Notes (Signed)
   Subjective:    Patient ID: Jared [name], male    DOB: 09-06-1966, 48 y.o.   MRN: [id number]  HPI    Patient presents for follow up of diabetes. [name] states that he is not having symptoms of DMII.   Symptoms have been basically asymptomatic. Patient denies foot ulcerations, increase appetite, nausea, paresthesia of the feet, polydipsia, polyuria, visual disturbances, vomitting and weight loss.  Evaluation to date has been included: fasting blood sugar and hemoglobin A1C.  He states that he has not been checking blood sugars at home and is currently not taking medications due to cost constraints.   [name] reports a history of  Crohn's disease which has previously been managed by Dr. Amedeo Plenty at [facility]. He also reports a h/o small bowel resection due to SBO secondary to strictures associated with Crohn's disease. He has not had consistent care over the past 2 years due to unemployment and loss of health insurance.  He was hospitalized in 04/2014 on Flagyl and a prednisone taper for treatment of crohn's exacerbation. He reports that  tenderness in the RUQ and RLQ of abdomen has improved. He denies fever, chills, nausea, vomiting, and diarrhea.    Review of Systems  Constitutional: Negative.   HENT: Negative.   Eyes: Negative.   Respiratory: Negative.   Musculoskeletal: Positive for myalgias.  Skin: Negative.   Allergic/Immunologic: Negative.   Neurological: Negative.   Hematological: Negative.   Psychiatric/Behavioral: Negative.        Objective:   Physical Exam  Constitutional: He is oriented to person, place, and time. He appears well-developed and well-nourished.  HENT:  Head: Normocephalic and atraumatic.  Right Ear: External ear normal.  Left Ear: External ear normal.  Mouth/Throat: Oropharynx is clear and moist.  Eyes: Conjunctivae and EOM are normal. Pupils are equal, round, and reactive to light.  Neck: Normal range of motion. Neck supple.  Cardiovascular: Normal  rate, regular rhythm, normal heart sounds and intact distal pulses.   Pulmonary/Chest: Effort normal and breath sounds normal.  Abdominal: Soft. Bowel sounds are normal. There is tenderness in the right lower quadrant and left lower quadrant.  Musculoskeletal: Normal range of motion.  Neurological: He is alert and oriented to person, place, and time. He has normal reflexes.  Skin: Skin is warm and dry.  Psychiatric: He has a normal mood and affect. His behavior is normal. Judgment and thought content normal.      BP 120/66  Pulse 77  Temp(Src) 98.2 F (36.8 C) (Oral)  Resp 14  Ht 6' 2"  (1.88 m)  Wt 215 lb (97.523 kg)  BMI 27.59 kg/m2    Assessment & Plan:   1. Type 2 diabetes mellitus with diabetic neuropathy Patient reports that he has not been taking medications consistently. He did not pick up medications post hospital visit due to cost constraints. Will re-check labs.  - Hemoglobin A1c - CBC with Differential - Basic Metabolic Panel  2. Tobacco use disorder [name] states that he is not ready to quit smoking. He currently smokes around 10 cigarettes per day.   3. Crohn's disease, without complications Stable. Patient states that he typically has abdominal pain and nausea prior to an impending crohn's exacerbation.    RTC: 1 month for F/U DMII  May Manrique M, FNP

## 2014-08-02 LAB — HEMOGLOBIN A1C
Hgb A1c MFr Bld: 8.9 % — ABNORMAL HIGH (ref <5.7)
Mean Plasma Glucose: 209 mg/dL — ABNORMAL HIGH (ref <117)

## 2014-08-02 LAB — CBC WITH DIFFERENTIAL/PLATELET
Basophils Absolute: 0 10*3/uL (ref 0.0–0.1)
Basophils Relative: 0 % (ref 0–1)
Eosinophils Absolute: 0.1 10*3/uL (ref 0.0–0.7)
Eosinophils Relative: 1 % (ref 0–5)
HCT: 45 % (ref 39.0–52.0)
Hemoglobin: 14.8 g/dL (ref 13.0–17.0)
Lymphocytes Relative: 33 % (ref 12–46)
Lymphs Abs: 2.4 10*3/uL (ref 0.7–4.0)
MCH: 28 pg (ref 26.0–34.0)
MCHC: 32.9 g/dL (ref 30.0–36.0)
MCV: 85.2 fL (ref 78.0–100.0)
Monocytes Absolute: 0.6 10*3/uL (ref 0.1–1.0)
Monocytes Relative: 9 % (ref 3–12)
Neutro Abs: 4.1 10*3/uL (ref 1.7–7.7)
Neutrophils Relative %: 57 % (ref 43–77)
Platelets: 281 10*3/uL (ref 150–400)
RBC: 5.28 MIL/uL (ref 4.22–5.81)
RDW: 16.5 % — ABNORMAL HIGH (ref 11.5–15.5)
WBC: 7.2 10*3/uL (ref 4.0–10.5)

## 2014-08-05 ENCOUNTER — Encounter: Payer: Self-pay | Admitting: Family Medicine

## 2014-08-30 ENCOUNTER — Ambulatory Visit: Payer: Self-pay | Admitting: Family Medicine

## 2014-09-05 ENCOUNTER — Ambulatory Visit (INDEPENDENT_AMBULATORY_CARE_PROVIDER_SITE_OTHER): Payer: Self-pay | Admitting: Family Medicine

## 2014-09-05 VITALS — BP 120/74 | HR 95 | Temp 98.2°F | Resp 16 | Ht 74.0 in | Wt 214.0 lb

## 2014-09-05 DIAGNOSIS — R220 Localized swelling, mass and lump, head: Secondary | ICD-10-CM

## 2014-09-05 DIAGNOSIS — E785 Hyperlipidemia, unspecified: Secondary | ICD-10-CM

## 2014-09-05 DIAGNOSIS — F17200 Nicotine dependence, unspecified, uncomplicated: Secondary | ICD-10-CM

## 2014-09-05 DIAGNOSIS — E119 Type 2 diabetes mellitus without complications: Secondary | ICD-10-CM

## 2014-09-05 DIAGNOSIS — K5090 Crohn's disease, unspecified, without complications: Secondary | ICD-10-CM

## 2014-09-05 DIAGNOSIS — K029 Dental caries, unspecified: Secondary | ICD-10-CM

## 2014-09-05 DIAGNOSIS — Z720 Tobacco use: Secondary | ICD-10-CM

## 2014-09-05 MED ORDER — VARENICLINE TARTRATE 0.5 MG PO TABS: 0.5000 mg | ORAL_TABLET | Freq: Two times a day (BID) | ORAL | Status: DC

## 2014-09-05 MED ORDER — METFORMIN HCL 500 MG PO TABS: 500.0000 mg | ORAL_TABLET | Freq: Two times a day (BID) | ORAL | Status: DC

## 2014-09-05 NOTE — Progress Notes (Signed)
Subjective:    Patient ID: Jared [name], male    DOB: 03-06-1966, 48 y.o.   MRN: [id number]  HPI  Patient presents for follow up of diabetes. Jared [name] states that he is not having symptoms of DMII. Symptoms have been basically asymptomatic. Patient denies foot ulcerations, increase appetite, nausea, paresthesia of the feet, polydipsia, polyuria, visual disturbances, vomitting and weight loss. Evaluation to date has been included: fasting blood sugar and hemoglobin A1C. He states that he has not been checking blood sugars at home and is currently not taking medications due to cost constraints.   Jared [name] reports a history of Crohn's disease which has previously been managed by Dr. Amedeo Plenty at [facility]. He also reports a h/o small bowel resection due to SBO secondary to strictures associated with Crohn's disease. He has not had consistent care over the past 2 years due to unemployment and loss of health insurance. He was hospitalized in 04/2014 on Flagyl and a prednisone taper for treatment of crohn's exacerbation. He reports that crohn's has been stable over the past few months. He denies fever, chills, abdominal pain,  nausea, vomiting, and diarrhea.   Past Medical History  Diagnosis Date  . Crohn disease 2008    with hospital admission in 2011, and 8/12 for flare up and SBO relieved with bowel rest. no suregery, no meds for CD  . Diabetes mellitus type II     Review of Systems  Constitutional: Negative.  Negative for fever.  HENT: Positive for dental problem (Patient reports that he is followed by a dentist for a left upper abscess. He is awaiting an appointment for and extraction).   Eyes: Negative.   Respiratory: Negative.   Cardiovascular: Negative.   Gastrointestinal: Negative.   Endocrine: Negative.   Genitourinary: Negative.   Musculoskeletal: Negative.   Allergic/Immunologic: Negative.   Neurological: Negative.   Hematological: Negative.   Psychiatric/Behavioral: Negative.         Objective:   Physical Exam  Constitutional: He is oriented to person, place, and time. Vital signs are normal. He appears well-developed and well-nourished.  HENT:  Head: Normocephalic and atraumatic.  Right Ear: Hearing, tympanic membrane, external ear and ear canal normal.  Left Ear: Hearing, tympanic membrane, external ear and ear canal normal.  Nose: Nose normal.  Mouth/Throat: Oropharynx is clear and moist. He does not have dentures. Abnormal dentition. Dental caries present.  Eyes: Conjunctivae are normal. Pupils are equal, round, and reactive to light.  Neck: Normal range of motion. Neck supple.  Cardiovascular: Normal rate, regular rhythm and normal heart sounds.   Pulmonary/Chest: Effort normal and breath sounds normal.  Abdominal: Soft. Bowel sounds are normal.  Musculoskeletal: Normal range of motion.  Neurological: He is alert and oriented to person, place, and time. He has normal reflexes.  Skin: Skin is warm and dry.  Psychiatric: He has a normal mood and affect. His speech is normal and behavior is normal. Judgment and thought content normal.         BP 120/74  Pulse 95  Temp(Src) 98.2 F (36.8 C) (Oral)  Resp 16  Ht 6' 2"  (1.88 m)  Wt 214 lb (97.07 kg)  BMI 27.46 kg/m2 Assessment & Plan:   1. Type 2 diabetes mellitus without complication Jared [name] states that he did not pick up the medications following last months appointment. We discussed hemoglobin A1c - Hemoglobin A1c; Future - metFORMIN (GLUCOPHAGE) 500 MG tablet; Take 1 tablet (500 mg total) by mouth 2 (two) times  daily with a meal.  Dispense: 180 tablet; Refill: 0  2. Tobacco use disorder Patient reports that he is ready to quit smoking. We discussed starting Chantix and possible side effects. Smoking cessation instruction/counseling given:  counseled patient on the dangers of tobacco use, advised patient to stop smoking, and reviewed strategies to maximize success - varenicline (CHANTIX) 0.5  MG tablet; Take 1 tablet (0.5 mg total) by mouth 2 (two) times daily.  Dispense: 60 tablet; Refill: 1  3. Left facial swelling He states that he is currently undergoing treatment for a dental abscess. He maintains that he cannot recall the name of the dentist. He is on Cephalexin, and reports that he is taking consistently. He reports that the dentist plans to remove multiple teeth after completing antibiotic therapy   4. Dental caries He is under the care of a dentist for extraction of teeth  5. Dyslipidemia ASCVD 6.3 %. Jared [name] states that he is continuing to maintain a low fat diet and remaining active. Will re-check lipid panel in 6 months.   6. Crohn's disease, without complications Stable; patient is currently not taking medications. He did not follow up with Dr. Amedeo Plenty.    Dorena Dew, FNP

## 2014-09-06 ENCOUNTER — Encounter: Payer: Self-pay | Admitting: Family Medicine

## 2014-09-06 DIAGNOSIS — E785 Hyperlipidemia, unspecified: Secondary | ICD-10-CM | POA: Insufficient documentation

## 2014-09-06 DIAGNOSIS — R220 Localized swelling, mass and lump, head: Secondary | ICD-10-CM | POA: Insufficient documentation

## 2014-09-06 DIAGNOSIS — K029 Dental caries, unspecified: Secondary | ICD-10-CM | POA: Insufficient documentation

## 2014-09-06 MED ORDER — METFORMIN HCL 500 MG PO TABS: 500.0000 mg | ORAL_TABLET | Freq: Two times a day (BID) | ORAL | Status: DC

## 2014-09-06 NOTE — Patient Instructions (Signed)
Diabetes Mellitus and Food It is important for you to manage your blood sugar (glucose) level. Your blood glucose level can be greatly affected by what you eat. Eating healthier foods in the appropriate amounts throughout the day at about the same time each day will help you control your blood glucose level. It can also help slow or prevent worsening of your diabetes mellitus. Healthy eating may even help you improve the level of your blood pressure and reach or maintain a healthy weight.  HOW CAN FOOD AFFECT ME? Carbohydrates Carbohydrates affect your blood glucose level more than any other type of food. Your dietitian will help you determine how many carbohydrates to eat at each meal and teach you how to count carbohydrates. Counting carbohydrates is important to keep your blood glucose at a healthy level, especially if you are using insulin or taking certain medicines for diabetes mellitus. Alcohol Alcohol can cause sudden decreases in blood glucose (hypoglycemia), especially if you use insulin or take certain medicines for diabetes mellitus. Hypoglycemia can be a life-threatening condition. Symptoms of hypoglycemia (sleepiness, dizziness, and disorientation) are similar to symptoms of having too much alcohol.  If your health care provider has given you approval to drink alcohol, do so in moderation and use the following guidelines:  Women should not have more than one drink per day, and men should not have more than two drinks per day. One drink is equal to:  12 oz of beer.  5 oz of wine.  1 oz of hard liquor.  Do not drink on an empty stomach.  Keep yourself hydrated. Have water, diet soda, or unsweetened iced tea.  Regular soda, juice, and other mixers might contain a lot of carbohydrates and should be counted. WHAT FOODS ARE NOT RECOMMENDED? As you make food choices, it is important to remember that all foods are not the same. Some foods have fewer nutrients per serving than other  foods, even though they might have the same number of calories or carbohydrates. It is difficult to get your body what it needs when you eat foods with fewer nutrients. Examples of foods that you should avoid that are high in calories and carbohydrates but low in nutrients include:  Trans fats (most processed foods list trans fats on the Nutrition Facts label).  Regular soda.  Juice.  Candy.  Sweets, such as cake, pie, doughnuts, and cookies.  Fried foods. WHAT FOODS CAN I EAT? Have nutrient-rich foods, which will nourish your body and keep you healthy. The food you should eat also will depend on several factors, including:  The calories you need.  The medicines you take.  Your weight.  Your blood glucose level.  Your blood pressure level.  Your cholesterol level. You also should eat a variety of foods, including:  Protein, such as meat, poultry, fish, tofu, nuts, and seeds (lean animal proteins are best).  Fruits.  Vegetables.  Dairy products, such as milk, cheese, and yogurt (low fat is best).  Breads, grains, pasta, cereal, rice, and beans.  Fats such as olive oil, trans fat-free margarine, canola oil, avocado, and olives. DOES EVERYONE WITH DIABETES MELLITUS HAVE THE SAME MEAL PLAN? Because every person with diabetes mellitus is different, there is not one meal plan that works for everyone. It is very important that you meet with a dietitian who will help you create a meal plan that is just right for you. Document Released: 07/29/2005 Document Revised: 11/06/2013 Document Reviewed: 09/28/2013 [facility] Patient Information 2015 [place], [place]. This  information is not intended to replace advice given to you by your health care provider. Make sure you discuss any questions you have with your health care provider. Daily Diabetes Record Check your blood glucose (BG) as directed by your health care provider. Use this form to record your results as well as any diabetes  medicines you take, including insulin. Checking your BG, recording it, and bringing your records to your health care provider is very helpful in managing your diabetes. These numbers help your health care provider know if any changes are needed to your diabetes plan.  Week of _____________________________ Date: _________  Jared [name], BG/Medicines: ________________ / __________________________________________________________  LUNCH, BG/Medicines: ____________________ / __________________________________________________________  Jared [name], BG/Medicines: ___________________ / __________________________________________________________  BEDTIME, BG/Medicines: __________________ / __________________________________________________________ Date: _________  Jared [name], BG/Medicines: ________________ / __________________________________________________________  LUNCH, BG/Medicines: ____________________ / __________________________________________________________  Jared [name], BG/Medicines: ___________________ / __________________________________________________________  BEDTIME, BG/Medicines: __________________ / __________________________________________________________ Date: _________  Jared [name], BG/Medicines: ________________ / __________________________________________________________  LUNCH, BG/Medicines: ____________________ / __________________________________________________________  Jared [name], BG/Medicines: ___________________ / __________________________________________________________  BEDTIME, BG/Medicines: __________________ / __________________________________________________________ Date: _________  Jared [name], BG/Medicines: ________________ / __________________________________________________________  LUNCH, BG/Medicines: ____________________ / __________________________________________________________  Jared [name], BG/Medicines: ___________________ /  __________________________________________________________  BEDTIME, BG/Medicines: __________________ / __________________________________________________________ Date: _________  Jared [name], BG/Medicines: ________________ / __________________________________________________________  LUNCH, BG/Medicines: ____________________ / __________________________________________________________  Jared [name], BG/Medicines: ___________________ / __________________________________________________________  BEDTIME, BG/Medicines: __________________ / __________________________________________________________ Date: _________  Jared [name], BG/Medicines: ________________ / __________________________________________________________  LUNCH, BG/Medicines: ____________________ / __________________________________________________________  Jared [name], BG/Medicines: ___________________ / __________________________________________________________  BEDTIME, BG/Medicines: __________________ / __________________________________________________________ Date: _________  Jared [name], BG/Medicines: ________________ / __________________________________________________________  LUNCH, BG/Medicines: ____________________ / __________________________________________________________  Jared [name], BG/Medicines: ___________________ / __________________________________________________________  BEDTIME, BG/Medicines: __________________ / __________________________________________________________ Notes: __________________________________________________________________________________________________ Document Released: 10/05/2004 Document Revised: 03/18/2014 Document Reviewed: 12/26/2013 [employer] Patient Information 2015 [employer]. This information is not intended to replace advice given to you by your health care provider. Make sure you discuss any questions you have with your health care provider. How to Avoid Diabetes Problems You can  do a lot to prevent or slow down diabetes problems. Following your diabetes plan and taking care of yourself can reduce your risk of serious or life-threatening complications. Below, you will find certain things you can do to prevent diabetes problems. MANAGE YOUR DIABETES Follow your health care provider's, nurse educator's, and dietitian's instructions for managing your diabetes. They will teach you the basics of diabetes care. They can help answer questions you may have. Learn about diabetes and make healthy choices regarding eating and physical activity. Monitor your blood glucose level regularly. Your health care provider will help you decide how often to check your blood glucose level depending on your treatment goals and how well you are meeting them.  DO NOT USE NICOTINE Nicotine and diabetes are a dangerous combination. Nicotine raises your risk for diabetes problems. If you quit using nicotine, you will lower your risk for heart attack, stroke, nerve disease, and kidney disease. Your cholesterol and your blood pressure levels may improve. Your blood circulation will also improve. Do not use any tobacco products, including cigarettes, chewing tobacco, or electronic cigarettes. If you need help quitting, ask your health care provider. KEEP YOUR BLOOD PRESSURE UNDER CONTROL Keeping your blood pressure under control will help prevent damage to your eyes, kidneys, heart, and blood vessels. Blood pressure consists of two numbers. The top number should be below 120, and the bottom number should be below 80 (120/80). Keep your blood pressure as close to these numbers as you can. If you already have kidney  disease, you may want even lower blood pressure to protect your kidneys. Talk to your health care provider to make sure that your blood pressure goal is right for your needs. Meal planning, medicines, and exercise can help you reach your blood pressure target. Have your blood pressure checked at every visit  with your health care provider. KEEP YOUR CHOLESTEROL UNDER CONTROL Normal cholesterol levels will help prevent heart disease and stroke. These are the biggest health problems for people with diabetes. Keeping cholesterol levels under control can also help with blood flow. Have your cholesterol level checked at least once a year. Your health care provider may prescribe a medicine known as a statin. Statins lower your cholesterol. If you are not taking a statin, ask your health care provider if you should be. Meal planning, exercise, and medicines can help you reach your cholesterol targets.  SCHEDULE AND KEEP YOUR ANNUAL PHYSICAL EXAMS AND EYE EXAMS Your health care provider will tell you how often he or she wants to see you depending on your plan of treatment. It is important that you keep these appointments so that possible problems can be identified early and complications can be avoided or treated.  Every visit with your health care provider should include your weight, blood pressure, and an evaluation of your blood glucose control.  Your hemoglobin A1c should be checked:  At least twice a year if you are at your goal.  Every 3 months if there are changes in treatment.  If you are not meeting your goals.  Your blood lipids should be checked yearly. You should also be checked yearly to see if you have protein in your urine (microalbumin).  Schedule a dilated eye exam within 5 years of your diagnosis if you have type 1 diabetes, and then yearly. Schedule a dilated eye exam at diagnosis if you have type 2 diabetes, and then yearly. All exams thereafter can be extended to every 2 to 3 years if one or more exams have been normal. KEEP YOUR VACCINES CURRENT The flu vaccine is recommended yearly. The formula for the vaccine changes every year and needs to be updated for the best protection against current viruses. It is recommended that people with diabetes who are over 37 years old get the  pneumonia vaccine. In some cases, two separate shots may be given. Ask your health care provider if your pneumonia vaccination is up-to-date. However, there are some instances where another vaccine is recommended. Check with your health care provider. TAKE CARE OF YOUR FEET  Diabetes may cause you to have a poor blood supply (circulation) to your legs and feet. Because of this, the skin may be thinner, break easier, and heal more slowly. You also may have nerve damage in your legs and feet, causing decreased feeling. You may not notice minor injuries to your feet that could lead to serious problems or infections. Taking care of your feet is very important. Visual foot exams are performed at every routine medical visit. The exams check for cuts, injuries, or other problems with the feet. A comprehensive foot exam should be done yearly. This includes visual inspection as well as assessing foot pulses and testing for loss of sensation. You should also do the following:  Inspect your feet daily for cuts, calluses, blisters, ingrown toenails, and signs of infection, such as redness, swelling, or pus.  Wash and dry your feet thoroughly, especially between the toes.  Avoid soaking your feet regularly in hot water baths.  Moisturize dry  skin with lotion, avoiding areas between your toes.  Cut toenails straight across and file the edges.  Avoid shoes that do not fit well or have areas that irritate your skin.  Avoid going barefooted or wearing only socks. Your feet need protection. TAKE CARE OF YOUR TEETH People with poorly controlled diabetes are more likely to have gum (periodontal) disease. These infections make diabetes harder to control. Periodontal diseases, if left untreated, can lead to tooth loss. Brush your teeth twice a day, floss, and see your dentist for checkups and cleaning every 6 months, or 2 times a year. ASK YOUR HEALTH CARE PROVIDER ABOUT TAKING ASPIRIN Taking aspirin daily is  recommended to help prevent cardiovascular disease in people with and without diabetes. Ask your health care provider if this would benefit you and what dose he or she would recommend. DRINK RESPONSIBLY Moderate amounts of alcohol (less than 1 drink per day for adult women and less than 2 drinks per day for adult men) have a minimal effect on blood glucose if ingested with food. It is important to eat food with alcohol to avoid hypoglycemia. People should avoid alcohol if they have a history of alcohol abuse or dependence, if they are pregnant, and if they have liver disease, pancreatitis, advanced neuropathy, or severe hypertriglyceridemia. LESSEN STRESS Living with diabetes can be stressful. When you are under stress, your blood glucose may be affected in two ways:  Stress hormones may cause your blood glucose to rise.  You may be distracted from taking good care of yourself. It is a good idea to be aware of your stress level and make changes that are necessary to help you better manage challenging situations. Support groups, planned relaxation, a hobby you enjoy, meditation, healthy relationships, and exercise all work to lower your stress level. If your efforts do not seem to be helping, get help from your health care provider or a trained mental health professional. Document Released: 07/20/2011 Document Revised: 03/18/2014 Document Reviewed: 12/26/2013 [facility] Patient Information 2015 [place], [place]. This information is not intended to replace advice given to you by your health care provider. Make sure you discuss any questions you have with your health care provider.

## 2015-03-14 ENCOUNTER — Emergency Department (HOSPITAL_COMMUNITY)
Admission: EM | Admit: 2015-03-14 | Discharge: 2015-03-14 | Disposition: A | Discharge status: Home / Self Care | Payer: Self-pay | Attending: Emergency Medicine | Admitting: Emergency Medicine

## 2015-03-14 ENCOUNTER — Emergency Department (HOSPITAL_COMMUNITY): Payer: Self-pay

## 2015-03-14 ENCOUNTER — Encounter (HOSPITAL_COMMUNITY): Payer: Self-pay

## 2015-03-14 DIAGNOSIS — Z79899 Other long term (current) drug therapy: Secondary | ICD-10-CM | POA: Insufficient documentation

## 2015-03-14 DIAGNOSIS — R197 Diarrhea, unspecified: Secondary | ICD-10-CM | POA: Insufficient documentation

## 2015-03-14 DIAGNOSIS — R1032 Left lower quadrant pain: Secondary | ICD-10-CM | POA: Insufficient documentation

## 2015-03-14 DIAGNOSIS — R110 Nausea: Secondary | ICD-10-CM | POA: Insufficient documentation

## 2015-03-14 DIAGNOSIS — R109 Unspecified abdominal pain: Secondary | ICD-10-CM

## 2015-03-14 DIAGNOSIS — Z720 Tobacco use: Secondary | ICD-10-CM | POA: Insufficient documentation

## 2015-03-14 DIAGNOSIS — R140 Abdominal distension (gaseous): Secondary | ICD-10-CM | POA: Insufficient documentation

## 2015-03-14 DIAGNOSIS — R1031 Right lower quadrant pain: Secondary | ICD-10-CM | POA: Insufficient documentation

## 2015-03-14 DIAGNOSIS — E876 Hypokalemia: Secondary | ICD-10-CM | POA: Insufficient documentation

## 2015-03-14 DIAGNOSIS — E119 Type 2 diabetes mellitus without complications: Secondary | ICD-10-CM | POA: Insufficient documentation

## 2015-03-14 LAB — COMPREHENSIVE METABOLIC PANEL
ALT: 26 U/L (ref 0–53)
AST: 21 U/L (ref 0–37)
Albumin: 3.5 g/dL (ref 3.5–5.2)
Alkaline Phosphatase: 89 U/L (ref 39–117)
Anion gap: 9 (ref 5–15)
BUN: <5 mg/dL — ABNORMAL LOW (ref 6–23)
CO2: 24 mmol/L (ref 19–32)
Calcium: 8.6 mg/dL (ref 8.4–10.5)
Chloride: 105 mmol/L (ref 96–112)
Creatinine, Ser: 0.76 mg/dL (ref 0.50–1.35)
GFR calc Af Amer: >90 mL/min (ref >90)
GFR calc non Af Amer: >90 mL/min (ref >90)
Glucose, Bld: 236 mg/dL — ABNORMAL HIGH (ref 70–99)
Potassium: 3.2 mmol/L — ABNORMAL LOW (ref 3.5–5.1)
Sodium: 138 mmol/L (ref 135–145)
Total Bilirubin: 0.6 mg/dL (ref 0.3–1.2)
Total Protein: 7.4 g/dL (ref 6.0–8.3)

## 2015-03-14 LAB — CBC WITH DIFFERENTIAL/PLATELET
Basophils Absolute: 0 10*3/uL (ref 0.0–0.1)
Basophils Relative: 0 % (ref 0–1)
Eosinophils Absolute: 0.1 10*3/uL (ref 0.0–0.7)
Eosinophils Relative: 2 % (ref 0–5)
HCT: 40.1 % (ref 39.0–52.0)
Hemoglobin: 13.7 g/dL (ref 13.0–17.0)
Lymphocytes Relative: 32 % (ref 12–46)
Lymphs Abs: 1.9 10*3/uL (ref 0.7–4.0)
MCH: 28.1 pg (ref 26.0–34.0)
MCHC: 34.2 g/dL (ref 30.0–36.0)
MCV: 82.3 fL (ref 78.0–100.0)
Monocytes Absolute: 0.7 10*3/uL (ref 0.1–1.0)
Monocytes Relative: 11 % (ref 3–12)
Neutro Abs: 3.2 10*3/uL (ref 1.7–7.7)
Neutrophils Relative %: 55 % (ref 43–77)
Platelets: 204 10*3/uL (ref 150–400)
RBC: 4.87 MIL/uL (ref 4.22–5.81)
RDW: 14.4 % (ref 11.5–15.5)
WBC: 5.9 10*3/uL (ref 4.0–10.5)

## 2015-03-14 LAB — LIPASE, BLOOD: Lipase: 39 U/L (ref 11–59)

## 2015-03-14 MED ORDER — IOHEXOL 300 MG/ML  SOLN
25.0000 mL | Freq: Once | INTRAMUSCULAR | Status: AC | PRN
  Administered 2015-03-14: 25 mL via ORAL

## 2015-03-14 MED ORDER — IOHEXOL 300 MG/ML  SOLN
100.0000 mL | Freq: Once | INTRAMUSCULAR | Status: AC | PRN
  Administered 2015-03-14: 100 mL via INTRAVENOUS

## 2015-03-14 MED ORDER — POTASSIUM CHLORIDE CRYS ER 20 MEQ PO TBCR
40.0000 meq | EXTENDED_RELEASE_TABLET | Freq: Once | ORAL | Status: AC
  Administered 2015-03-14: 40 meq via ORAL
  Filled 2015-03-14: qty 2

## 2015-03-14 MED ORDER — ONDANSETRON 4 MG PO TBDP
ORAL_TABLET | ORAL | Status: AC
  Filled 2015-03-14: qty 2

## 2015-03-14 MED ORDER — ONDANSETRON HCL 4 MG/2ML IJ SOLN
4.0000 mg | Freq: Once | INTRAMUSCULAR | Status: AC
  Administered 2015-03-14: 4 mg via INTRAVENOUS
  Filled 2015-03-14: qty 2

## 2015-03-14 MED ORDER — ONDANSETRON 4 MG PO TBDP
8.0000 mg | ORAL_TABLET | Freq: Once | ORAL | Status: AC
  Administered 2015-03-14: 8 mg via ORAL

## 2015-03-14 MED ORDER — MORPHINE SULFATE 4 MG/ML IJ SOLN
4.0000 mg | Freq: Once | INTRAMUSCULAR | Status: AC
  Administered 2015-03-14: 4 mg via INTRAVENOUS
  Filled 2015-03-14: qty 1

## 2015-03-14 MED ORDER — SODIUM CHLORIDE 0.9 % IV BOLUS (SEPSIS)
1000.0000 mL | Freq: Once | INTRAVENOUS | Status: AC
  Administered 2015-03-14: 1000 mL via INTRAVENOUS

## 2015-03-14 MED ORDER — DICYCLOMINE HCL 20 MG PO TABS: 20.0000 mg | ORAL_TABLET | Freq: Two times a day (BID) | ORAL | Status: DC

## 2015-03-14 MED ORDER — ONDANSETRON 4 MG PO TBDP: 4.0000 mg | ORAL_TABLET | Freq: Three times a day (TID) | ORAL | Status: DC | PRN

## 2015-03-14 NOTE — ED Provider Notes (Signed)
CSN: [id number]     Arrival date & time 03/14/15  1455 History   First MD Initiated Contact with Patient 03/14/15 1802     Chief Complaint  Patient presents with  . Abdominal Pain     (Consider location/radiation/quality/duration/timing/severity/associated sxs/prior Treatment) Patient is a 49 y.o. male presenting with abdominal pain. The history is provided by the patient and medical records.  Abdominal Pain Associated symptoms: diarrhea     This is a 49 year old male with past medical history significant for diabetes and Crohn's disease, presenting to the ED for abdominal pain. Patient states pain is been generalized for the past few days associated with a bloating sensation and nausea. He denies any vomiting. He has had diarrhea as well. Patient has been dealing with Crohn's disease since 2008 and has had numerous complications including bowel perforation, bowel obstruction, and abscess. Patient has required exploratory laparoscopy and partial small bowel resection in 2595 due to complications.  He states he was previously followed by GI, Dr. Amedeo Plenty, who ever has not seen him in a year or so.  He denies any recent fever, chills, or sweats. He is not currently on any maintenance medications for his Crohn's disease.  Past Medical History  Diagnosis Date  . Crohn disease 2008    with hospital admission in 2011, and 8/12 for flare up and SBO relieved with bowel rest. no suregery, no meds for CD  . Diabetes mellitus type II    Past Surgical History  Procedure Laterality Date  . 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 Scrotal surgery  ~ 1971    "took out a pellet"  . Laparotomy  01/25/2012    Procedure: EXPLORATORY LAPAROTOMY;  Surgeon: [name], MD;  Location: [facility];  Service: General;  Laterality: N/A;  . Bowel resection  01/25/2012    Procedure: SMALL BOWEL RESECTION;  Surgeon: [name], MD;  Location: [facility];  Service: General;  Laterality: N/A;    Family History  Problem Relation Age of Onset  . Cancer Mother     lung  . Diabetes Mother   . Alopecia Mother   . Coronary artery disease Mother   . Diabetes Sister   . Hyperlipidemia Sister   . Hypertension Sister   . Angina Brother   . Hepatitis Brother   . Alcohol abuse Brother   . Diabetes Brother    History  Substance Use Topics  . Smoking status: Current Every Day Smoker -- 0.25 packs/day for 30 years    Types: Cigarettes  . Smokeless tobacco: Former Systems developer    Types: Chew    Quit date: 01/20/1997  . Alcohol Use: No    Review of Systems  Gastrointestinal: Positive for abdominal pain and diarrhea.  All other systems reviewed and are negative.     Allergies  Review of patient's allergies indicates no known allergies.  Home Medications   Prior to Admission medications   Medication Sig Start Date End Date Taking? Authorizing Provider  metFORMIN (GLUCOPHAGE) 500 MG tablet Take 1 tablet (500 mg total) by mouth 2 (two) times daily with a meal. 09/06/14   Dorena Dew, FNP  varenicline (CHANTIX) 0.5 MG tablet Take 1 tablet (0.5 mg total) by mouth 2 (two) times daily. 09/05/14   Dorena Dew, FNP   BP 114/72 mmHg  Pulse 72  Temp(Src) 98.2 F (36.8 C) (Oral)  Resp 14  Ht 6' 2"  (1.88 m)  Wt 210 lb 3  oz (95.34 kg)  BMI 26.97 kg/m2  SpO2 98%   Physical Exam  Constitutional: He is oriented to person, place, and time. He appears well-developed and well-nourished. No distress.  HENT:  Head: Normocephalic and atraumatic.  Mouth/Throat: Oropharynx is clear and moist.  Eyes: Conjunctivae and EOM are normal. Pupils are equal, round, and reactive to light.  Neck: Normal range of motion. Neck supple.  Cardiovascular: Normal rate, regular rhythm and normal heart sounds.   Pulmonary/Chest: Effort normal and breath sounds normal. No respiratory distress. He has no wheezes.  Abdominal: Soft. Bowel sounds are normal. He exhibits distension (mild). There is tenderness  in the right lower quadrant and left lower quadrant. There is no rigidity, no guarding and no CVA tenderness.  Abdomen appears mildly distended but remains soft, tenderness in right and left lower quadrants without peritonitis  Musculoskeletal: Normal range of motion. He exhibits no edema.  Neurological: He is alert and oriented to person, place, and time.  Skin: Skin is warm and dry. He is not diaphoretic.  Psychiatric: He has a normal mood and affect.  Nursing note and vitals reviewed.   ED Course  Procedures (including critical care time) Labs Review Labs Reviewed  COMPREHENSIVE METABOLIC PANEL - Abnormal; Notable for the following:    Potassium 3.2 (*)    Glucose, Bld 236 (*)    BUN <5 (*)    All other components within normal limits  CBC WITH DIFFERENTIAL/PLATELET  LIPASE, BLOOD    Imaging Review Ct Abdomen Pelvis W Contrast  03/14/2015   CLINICAL DATA:  Right lower quadrant pain. Nausea. Bloating. Diarrhea. Crohn's disease.  EXAM: CT ABDOMEN AND PELVIS WITH CONTRAST  TECHNIQUE: Multidetector CT imaging of the abdomen and pelvis was performed using the standard protocol following bolus administration of intravenous contrast.  CONTRAST:  128m OMNIPAQUE IOHEXOL 300 MG/ML  SOLN  COMPARISON:  05/10/2014  FINDINGS: Lower Chest:  Unremarkable.  Hepatobiliary: No masses or other significant abnormality identified. Gallbladder is unremarkable.  Pancreas: No mass, inflammatory changes, or other significant abnormality identified.  Spleen:  Within normal limits in size and appearance.  Adrenals:  No masses identified.  Kidneys/Urinary Tract: No evidence of masses or hydronephrosis. A few tiny left renal cysts remain stable.  Stomach/Bowel/Peritoneum: Mild wall thickening is again seen involving the terminal ileum up to the ileal colic anastomosis in the right lower quadrant. This is unchanged, however there is mild decrease in inflammatory changes in the adjacent mesenteric fat compared to  previous study. No new or worsening inflammatory changes are seen. No evidence of abscess or bowel obstruction.  Vascular/Lymphatic: No pathologically enlarged lymph nodes identified. No other significant abnormality visualized.  Reproductive:  No mass or other significant abnormality identified.  Other:  None.  Musculoskeletal:  No suspicious bone lesions identified.  IMPRESSION: Mild Crohn's disease involving the terminal ileum, with decreased adjacent mesenteric inflammatory changes compared to prior exam. No evidence of abscess, bowel obstruction, or other acute complication.   Electronically Signed   By: JEarle GellM.D.   On: 03/14/2015 20:56     EKG Interpretation None      MDM   Final diagnoses:  Nausea  Bloating  Abdominal pain   49year old male with history of Crohn's, here with generalized abdominal pain and nausea. He also reports a bloating sensation. He has a history of known complications secondary to his Crohn's disease. On exam, patient is afebrile and nontoxic in appearance. He has mild tenderness of his right and left lower quadrants  without peritonitis. His labwork is overall reassuring, mild hypokalemia noted which was replaced here in the ED. Given his history of prior complications, CT scan was obtained with inflammatory changes noted but no signs of abscess or bowel obstruction. Patient was treated with IV fluids and pain medication here in the ED with improvement of his symptoms. He has tolerated PO without difficulty. Patient we discharged home with symptomatic care. He is instructed to follow with his GI physician, Dr. Amedeo Plenty, next week.  Discussed plan with patient, he/she acknowledged understanding and agreed with plan of care.  Return precautions given for new or worsening symptoms.  Larene Pickett, PA-C 03/15/15 5945  Veryl Speak, MD 03/15/15 [phone]

## 2015-03-14 NOTE — ED Notes (Signed)
Pt.. Having generalized abdominal pain for a few days.  Nausea but no vomiting.  Having diarrhea and a headache.  Hx. Of Crohn's   Skin is warm and dry. GCS 15

## 2015-03-14 NOTE — Discharge Instructions (Signed)
Take the prescribed medication as directed to help control symptoms. Follow-up with Dr. Amedeo Plenty-- call to schedule appt. Return to the ED for new or worsening symptoms.

## 2015-03-14 NOTE — ED Notes (Signed)
Metformin sheet given to pt

## 2015-04-02 ENCOUNTER — Telehealth: Payer: Self-pay | Admitting: Internal Medicine

## 2015-04-02 NOTE — Telephone Encounter (Signed)
Received request from Disability Determination Services for medical records. Forwarded to [employer].

## 2015-06-05 ENCOUNTER — Emergency Department (HOSPITAL_COMMUNITY)
Admission: EM | Admit: 2015-06-05 | Discharge: 2015-06-05 | Disposition: A | Discharge status: Home / Self Care | Payer: Self-pay | Attending: Emergency Medicine | Admitting: Emergency Medicine

## 2015-06-05 ENCOUNTER — Encounter (HOSPITAL_COMMUNITY): Payer: Self-pay

## 2015-06-05 DIAGNOSIS — Z720 Tobacco use: Secondary | ICD-10-CM | POA: Insufficient documentation

## 2015-06-05 DIAGNOSIS — Z79899 Other long term (current) drug therapy: Secondary | ICD-10-CM | POA: Insufficient documentation

## 2015-06-05 DIAGNOSIS — Z8719 Personal history of other diseases of the digestive system: Secondary | ICD-10-CM

## 2015-06-05 DIAGNOSIS — K5090 Crohn's disease, unspecified, without complications: Secondary | ICD-10-CM | POA: Insufficient documentation

## 2015-06-05 DIAGNOSIS — Z0289 Encounter for other administrative examinations: Secondary | ICD-10-CM | POA: Insufficient documentation

## 2015-06-05 DIAGNOSIS — E119 Type 2 diabetes mellitus without complications: Secondary | ICD-10-CM | POA: Insufficient documentation

## 2015-06-05 NOTE — Discharge Instructions (Signed)
°Emergency Department Resource Guide °1) Find a Doctor and Pay Out of Pocket °Although you won't have to find out who is covered by your insurance plan, it is a good idea to ask around and get recommendations. You will then need to call the office and see if the doctor you have chosen will accept you as a new patient and what types of options they offer for patients who are self-pay. Some doctors offer discounts or will set up payment plans for their patients who do not have insurance, but you will need to ask so you aren't surprised when you get to your appointment. ° °2) Contact Your Local Health Department °Not all health departments have doctors that can see patients for sick visits, but many do, so it is worth a call to see if yours does. If you don't know where your local health department is, you can check in your phone book. The CDC also has a tool to help you locate your state's health department, and many state websites also have listings of all of their local health departments. ° °3) Find a Walk-in Clinic °If your illness is not likely to be very severe or complicated, you may want to try a walk in clinic. These are popping up all over the country in pharmacies, drugstores, and shopping centers. They're usually staffed by nurse practitioners or physician assistants that have been trained to treat common illnesses and complaints. They're usually fairly quick and inexpensive. However, if you have serious medical issues or chronic medical problems, these are probably not your best option. ° °No Primary Care Doctor: °- Call [facility] at  832-8000 - they can help you locate a primary care doctor that  accepts your insurance, provides certain services, etc. °- Physician Referral Service- 1-800-533-3463 ° °Chronic Pain Problems: °Organization         Address  Phone   Notes  °[facility]  (336) 297-2271 Patients need to be referred by their primary care doctor.  ° °Medication  Assistance: °Organization         Address  Phone   Notes  °[place] [facility] [address] °[place], [place] [address] (336) 641-8030 --Must be a resident of [place] °-- Must have NO insurance coverage whatsoever (no Medicaid/ Medicare, etc.) °-- The pt. MUST have a primary care doctor that directs their care regularly and follows them in the community °  °[facility]  (866) 331-1348   °[employer]  (888) 892-1162   ° °Agencies that provide inexpensive medical care: °Organization         Address  Phone   Notes  °[facility]  (336) 832-8035   °[facility]    (336) 832-7272   °[facility] [address] °[place], [place] [address] (336) 832-4777   °[facility] [address], °[place] (336) 271-4999   °[facility]    (336) 373-0678   °[facility]    (336) 272-1050   °[facility] ° [address], [place] Phone:  (336) 832-4444, Fax:  (336) 832-4440 Hours of Operation:  9 am - 6 pm, M-F.  Also accepts Medicaid/Medicare and self-pay.  °[facility] ° [address], [place] Phone: (336) 832-3150, Fax: (336) 832-3151. Hours of Operation:  8:30 am - 5:30 pm, M-F.  Also accepts Medicaid and self-pay.  °[facility] [place] [address], [place] Phone: (336) 878-6027   °[facility] [address], [place], [place] (336)723-1848, Ext. 123 Mondays & Thursdays: 7-9 AM.  First 15 patients are seen on a first come, first serve basis. °  ° °Medicaid-accepting [place] Providers: ° °Organization         Address  Phone   Notes  °[facility] [address], [place] (336) 641-2100 Also accepts self-pay patients.  °[facility] [address], [place] ° (336) 856-9996   °[facility] [address], [place]  (336) 288-8857   °[facility] [address] [place] [address], [place] (336) 299-7000   °Veita Bland [address], [place]  ° (336) 373-1557 Only accepts [place] Access Medicaid patients after they have their name applied to their card.  ° °Self-Pay (no insurance) in [place]: ° °Organization         Address  Phone   Notes  °Sickle Cell Patients, [facility] [address], [place] (336) 832-1970   °[facility] [address], [place] (336) 832-4400   °[facility] ° [address], [place] (336) 992-4800   °[facility]/Dr. Osei-Bonsu ° [address] [place] [address], [place] or [address], [place] (336) 841-8500 Phone number for both [place] and [place] locations is the same.  °[facility] [address], [place] (336) 299-0000   °[facility] [address] [place] [address], [place] or [address] (336) 852-7530 °(336) 878-2260   °[facility] [address], [place] (336) 350-1642, phone; (336) 294-5005, fax Sees patients 1st and 3rd Saturday of every month.  Must not qualify for public or private insurance (i.e. Medicaid, Medicare, [place] Health Choice, Veterans' Benefits) • Household income should be no more than 200% of the poverty level •The clinic cannot treat you if you are pregnant or think you are pregnant • Sexually transmitted diseases are not treated at the clinic.  ° ° °Dental Care: °Organization         Address  Phone  Notes  °[place] [facility] [address],  (336) 641-6152 Accepts children up to age 21 who are enrolled in Medicaid or [place] Health Choice; pregnant women with a Medicaid card; and children who have applied for Medicaid or [place] Health Choice, but were declined, whose parents can pay a reduced fee at time of service.  °[place]  [facility] [place]  [address], [place] (336) 641-7733 Accepts children up to age 21 who are enrolled in Medicaid or [place] Health Choice; pregnant women with a Medicaid card; and children who have applied for Medicaid or [place] Health Choice, but were declined, whose parents can pay a reduced fee at time of service.  °[facility] ° [address],  (336) 641-4533 Patients are seen by appointment only. Walk-ins are not accepted. [facility] will see patients 18 years of age and older. °Monday - Tuesday (8am-5pm) °Most Wednesdays (8:30-5pm) °$30 per visit, cash only  °[facility] ° [address], [place] (336) 641-4533 Patients are seen by appointment only. Walk-ins are not accepted. [facility] will see patients 18 years of age and older. °One   Wednesday Evening (Monthly: Volunteer Based).  $30 per visit, cash only  °[facility]  (919) 537-3737 for adults; Children under age 4, call Graduate Pediatric Dentistry at (919) [phone]. Children aged 4-14, please call (919) [phone] to request a pediatric application. ° Dental services are provided in all areas of dental care including fillings, crowns and bridges, complete and partial dentures, implants, gum treatment, root canals, and extractions. Preventive care is also provided. Treatment is provided to both adults and children. °Patients are selected via a lottery and there is often a waiting list. °  °[facility] [address], °[place] ° (336) 763-8833 [email or web address] °  °[facility] [address], [place], [place] (336)723-1848, Ext. 123 Second and Fourth Thursday of each month, opens at 6:30 AM; Clinic ends at 9 AM.  Patients are seen on a first-come first-served basis, and a limited number are seen during each clinic.  ° °[facility] ° [address], [place], [place] (336) 723-7904    Eligibility Requirements °You must have lived in [place], [place], or [place] counties for at least the last three months. °  You cannot be eligible for state or federal sponsored healthcare insurance, including Veterans Administration, Medicaid, or Medicare. °  You generally cannot be eligible for healthcare insurance through your employer.  °  How to apply: °Eligibility screenings are held every Tuesday and Wednesday afternoon from 1:00 pm until 4:00 pm. You do not need an appointment for the interview!  °[facility] [address], [place], [place] 336-631-2330   °[facility]  336-342-8273   °[facility]  336-703-3100   °[facility]  336-570-6415   ° °Behavioral Health Resources in the Community: °Intensive Outpatient Programs °Organization         Address  Phone  Notes  °[facility] [address], [place], [place] 336-878-6098   °[facility] [address], [place], [place] 336-832-9800   °[facility] [address], [place], [place] ° 336-882-2125   °[facility] [address],  °[place], [place] 1-800-853-5163 or 336-641-4981   °Substance Abuse Resources °Organization         Address  Phone  Notes  °[facility]  336-882-2125   °[facility]  336-784-9470   °[facility]  336-285-9073   °[facility]  336-845-3988   °Residential & Outpatient Substance Abuse Program  1-800-659-3381   °Psychological Services °Organization         Address  Phone  Notes  °[facility]  336- 832-9600   °[facility]  336- 378-7881   °[facility] [address], [place] 1-800-853-5163 or 336-641-4981   ° °Mobile Crisis Teams °Organization         Address  Phone  Notes  °[facility], Mobile Crisis Care Unit  1-877-626-1772   °Assertive °[facility] ° [address]  [place], [place] 336-834-9664   °Sharon DeEsch [address] °[place] 336-554-5454   ° °Self-Help/Support Groups °Organization         Address  Phone             Notes  °[facility] - variety of support groups  336- 373-1402 Call for more information  °Narcotics Anonymous (NA), [facility] [address], °[place]  2 meetings at this location  ° °  Residential Treatment Programs °Organization         Address  Phone  Notes  °[facility] [address],    °[place]  1-866-801-8205   °[facility] ° [address], [place], [place] 704-293-8524   °[facility] [address], [place] 336-845-3988 Admissions: 8am-3pm M-F  °[facility] [address]    °[place], [place] 336-841-1104   °[facility] [address], [place], [place] 336-379-7146   °[facility] [address]  °[place], [place] 336-285-9073   °[facility] - Intensive Outpatient [address], [place], [place] 336-852-3033   °[facility] [address]  °[place], [place] 1-877-615-2722 or 336-784-9470   °[facility] [address] [place], [place] 336-227-7417 Accepts Medicaid  °[facility] [address]  °[place] 1-800-659-3381 Substance Abuse/Addiction Treatment  ° °[place] Behavioral Health Resources °Organization         Address  Phone  Notes  °[facility]  (888) 581-9988   °Julie Brannon, PhD [address] [place], [place]   (336) 349-5553 or (336) 951-0000   °[facility]   [address] °[place], [place] (336) 349-4454   °[facility] [address], [place], [place] (336) 342-8316 Insurance/Medicaid/sponsorship through [facility]  °[facility] [address]                                    [place], [place] (336) 342-8316 Therapy/tele-psych/case    °[facility] [address]  ° [place], [place] (336) 349-2233    °Dr. Arfeen  (336) 349-4544   °[facility] [place]  [employer] [place] [facility] 1) [address], [place] °2) [address], [place] °3)  [address], [place] (336) 349-3220 °(336) 342-7768 ° °(336) 342-8140   °[place] [facility] (336) 342-1394 or (336) 342-3537 (After Hours)    ° ° °

## 2015-06-05 NOTE — ED Provider Notes (Signed)
CSN: [id number]     Arrival date & time 06/05/15  2011 History  This chart was scribed for non-physician practitioner, Antonietta Breach, PA-C, working with Quintella Reichert, MD, by Peyton Bottoms ED Scribe. This patient was seen in room WTR6/WTR6 and the patient's care was started at 9:26 PM    Chief Complaint  Patient presents with  . Crohn's Disease   The history is provided by the patient. No language interpreter was used.   HPI Comments: Jared [name] is a 49 y.o. male with a PMHx of Crohn disease and diabetes, who presents to the Emergency Department complaining of obtaining a doctor's note to state that he cannot work. Pt states that Social Services told him to obtain a doctor's note. Discussed with patient informing him that he needs to have a PCP for similar concerns.  Past Medical History  Diagnosis Date  . Crohn disease 2008    with hospital admission in 2011, and 8/12 for flare up and SBO relieved with bowel rest. no suregery, no meds for CD  . Diabetes mellitus type II    Past Surgical History  Procedure Laterality Date  . 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 Scrotal surgery  ~ 1971    "took out a pellet"  . Laparotomy  01/25/2012    Procedure: EXPLORATORY LAPAROTOMY;  Surgeon: [name], MD;  Location: [place];  Service: General;  Laterality: N/A;  . Bowel resection  01/25/2012    Procedure: SMALL BOWEL RESECTION;  Surgeon: [name], MD;  Location: [place] [facility];  Service: General;  Laterality: N/A;   Family History  Problem Relation Age of Onset  . Cancer Mother     lung  . Diabetes Mother   . Alopecia Mother   . Coronary artery disease Mother   . Diabetes Sister   . Hyperlipidemia Sister   . Hypertension Sister   . Angina Brother   . Hepatitis Brother   . Alcohol abuse Brother   . Diabetes Brother    History  Substance Use Topics  . Smoking status: Current Every Day Smoker -- 0.25 packs/day for 30 years    Types: Cigarettes  .  Smokeless tobacco: Former Systems developer    Types: Chew    Quit date: 01/20/1997  . Alcohol Use: No     Review of Systems  All other systems reviewed and are negative.   Allergies  Review of patient's allergies indicates no known allergies.  Home Medications   Prior to Admission medications   Medication Sig Start Date End Date Taking? Authorizing Provider  dicyclomine (BENTYL) 20 MG tablet Take 1 tablet (20 mg total) by mouth 2 (two) times daily. 03/14/15   Jared Pickett, PA-C  metFORMIN (GLUCOPHAGE) 500 MG tablet Take 1 tablet (500 mg total) by mouth 2 (two) times daily with a meal. 09/06/14   Dorena Dew, FNP  ondansetron (ZOFRAN ODT) 4 MG disintegrating tablet Take 1 tablet (4 mg total) by mouth every 8 (eight) hours as needed for nausea. 03/14/15   Jared Pickett, PA-C  varenicline (CHANTIX) 0.5 MG tablet Take 1 tablet (0.5 mg total) by mouth 2 (two) times daily. 09/05/14   Dorena Dew, FNP   Triage Vitals: BP 141/79 mmHg  Pulse 87  Temp(Src) 98.7 F (37.1 C) (Oral)  Resp 16  SpO2 100%  Physical Exam  Constitutional: He is oriented to person, place, and time. He appears well-developed and well-nourished. No distress.  HENT:  Head: Normocephalic and atraumatic.  Eyes: Conjunctivae and EOM are normal. No scleral icterus.  Neck: Normal range of motion.  Pulmonary/Chest: Effort normal. No respiratory distress.  Musculoskeletal: Normal range of motion.  Neurological: He is alert and oriented to person, place, and time. He exhibits normal muscle tone. Coordination normal.  Skin: Skin is warm and dry. No rash noted. He is not diaphoretic. No erythema. No pallor.  Psychiatric: He has a normal mood and affect. His behavior is normal.  Nursing note and vitals reviewed.   ED Course  Procedures (including critical care time)  DIAGNOSTIC STUDIES: Oxygen Saturation is 100% on RA, normal by my interpretation.    COORDINATION OF CARE: 9:28 PM- Discussed plans to give pt a  referral to the [facility]. Informed pt that he needs to get a note from a PCP not the ED. Pt advised of plan for treatment and pt agrees.  Labs Review Labs Reviewed - No data to display  Imaging Review No results found.   EKG Interpretation None     MDM   Final diagnoses:  Hx of Crohn's disease    49 year old male presents to the emergency Department requesting a doctor's note stating that he is unable to work because of his Crohn's disease. Patient has no other complaints for this visit. He has no complaints of abdominal pain. Patient is afebrile and hemodynamically stable. I have explained to the patient that we are unable to give such a note and he will need to follow up with a primary care provider for this reason. Patient given referral to [facility] as well as a resource guide for primary care services in the area. Return precautions given. Patient discharged in good condition.  I personally performed the services described in this documentation, which was scribed in my presence. The recorded information has been reviewed and is accurate.   Filed Vitals:   06/05/15 2102  BP: 141/79  Pulse: 87  Temp: 98.7 F (37.1 C)  TempSrc: Oral  Resp: 16  SpO2: 100%     Antonietta Breach, PA-C 06/05/15 2228  Quintella Reichert, MD 06/06/15 [phone]

## 2015-06-05 NOTE — ED Notes (Signed)
Patient states, "I need a doctor's note saying I can't work."  Denies any medical issues at this time.  Reports he has frequent Crohn's flares.  He does not have PCP.  He says he was told to come to ED by [employer] surveyor.

## 2015-08-07 ENCOUNTER — Ambulatory Visit: Payer: Self-pay

## 2015-08-18 ENCOUNTER — Ambulatory Visit: Payer: Self-pay | Admitting: Family Medicine

## 2015-09-10 ENCOUNTER — Telehealth: Payer: Self-pay | Admitting: Family Medicine

## 2015-09-10 NOTE — Telephone Encounter (Signed)
Received request for medical records. Forwarded to [employer].

## 2015-09-15 ENCOUNTER — Telehealth: Payer: Self-pay | Admitting: Internal Medicine

## 2015-09-15 NOTE — Telephone Encounter (Signed)
Received request for medical records from [place] [facility]. Forwarded to [employer].

## 2016-02-24 ENCOUNTER — Ambulatory Visit: Payer: Self-pay | Attending: Internal Medicine

## 2016-03-04 ENCOUNTER — Encounter (HOSPITAL_COMMUNITY): Payer: Self-pay

## 2016-03-04 ENCOUNTER — Emergency Department (HOSPITAL_COMMUNITY)
Admission: EM | Admit: 2016-03-04 | Discharge: 2016-03-04 | Disposition: A | Discharge status: Home / Self Care | Payer: Self-pay | Attending: Emergency Medicine | Admitting: Emergency Medicine

## 2016-03-04 DIAGNOSIS — F17210 Nicotine dependence, cigarettes, uncomplicated: Secondary | ICD-10-CM | POA: Insufficient documentation

## 2016-03-04 DIAGNOSIS — Z7984 Long term (current) use of oral hypoglycemic drugs: Secondary | ICD-10-CM | POA: Insufficient documentation

## 2016-03-04 DIAGNOSIS — K029 Dental caries, unspecified: Secondary | ICD-10-CM | POA: Insufficient documentation

## 2016-03-04 DIAGNOSIS — E119 Type 2 diabetes mellitus without complications: Secondary | ICD-10-CM | POA: Insufficient documentation

## 2016-03-04 DIAGNOSIS — Z791 Long term (current) use of non-steroidal anti-inflammatories (NSAID): Secondary | ICD-10-CM | POA: Insufficient documentation

## 2016-03-04 DIAGNOSIS — K5090 Crohn's disease, unspecified, without complications: Secondary | ICD-10-CM | POA: Insufficient documentation

## 2016-03-04 DIAGNOSIS — K047 Periapical abscess without sinus: Secondary | ICD-10-CM

## 2016-03-04 MED ORDER — PENICILLIN V POTASSIUM 500 MG PO TABS: 500.0000 mg | ORAL_TABLET | Freq: Three times a day (TID) | ORAL | Status: DC

## 2016-03-04 MED ORDER — IBUPROFEN 800 MG PO TABS: 800.0000 mg | ORAL_TABLET | Freq: Three times a day (TID) | ORAL | Status: DC

## 2016-03-04 MED ORDER — IBUPROFEN 800 MG PO TABS
800.0000 mg | ORAL_TABLET | Freq: Once | ORAL | Status: AC
  Administered 2016-03-04: 800 mg via ORAL
  Filled 2016-03-04: qty 1

## 2016-03-04 MED FILL — IBUPROFEN 800 MG TABLET: 800 | 7 days supply | Qty: 21 | Fill #0

## 2016-03-04 MED FILL — PENICILLIN VK 500 MG TABLET: 500 | 10 days supply | Qty: 30 | Fill #0
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
## 2016-03-04 NOTE — Discharge Instructions (Signed)
Dental Caries Dental caries (also called tooth decay) is the most common oral disease. It can occur at any age but is more common in children and young adults.  HOW DENTAL CARIES DEVELOPS  The process of decay begins when bacteria and foods (particularly sugars and starches) combine in your mouth to produce plaque. Plaque is a substance that sticks to the hard, outer surface of a tooth (enamel). The bacteria in plaque produce acids that attack enamel. These acids may also attack the root surface of a tooth (cementum) if it is exposed. Repeated attacks dissolve these surfaces and create holes in the tooth (cavities). If left untreated, the acids destroy the other layers of the tooth.  RISK FACTORS  Frequent sipping of sugary beverages.   Frequent snacking on sugary and starchy foods, especially those that easily get stuck in the teeth.   Poor oral hygiene.   Dry mouth.   Substance abuse such as methamphetamine abuse.   Broken or poor-fitting dental restorations.   Eating disorders.   Gastroesophageal reflux disease (GERD).   Certain radiation treatments to the head and neck. SYMPTOMS In the early stages of dental caries, symptoms are seldom present. Sometimes white, chalky areas may be seen on the enamel or other tooth layers. In later stages, symptoms may include:  Pits and holes on the enamel.  Toothache after sweet, hot, or cold foods or drinks are consumed.  Pain around the tooth.  Swelling around the tooth. DIAGNOSIS  Most of the time, dental caries is detected during a regular dental checkup. A diagnosis is made after a thorough medical and dental history is taken and the surfaces of your teeth are checked for signs of dental caries. Sometimes special instruments, such as lasers, are used to check for dental caries. Dental X-ray exams may be taken so that areas not visible to the eye (such as between the contact areas of the teeth) can be checked for cavities.    TREATMENT  If dental caries is in its early stages, it may be reversed with a fluoride treatment or an application of a remineralizing agent at the dental office. Thorough brushing and flossing at home is needed to aid these treatments. If it is in its later stages, treatment depends on the location and extent of tooth destruction:   If a small area of the tooth has been destroyed, the destroyed area will be removed and cavities will be filled with a material such as gold, silver amalgam, or composite resin.   If a large area of the tooth has been destroyed, the destroyed area will be removed and a cap (crown) will be fitted over the remaining tooth structure.   If the center part of the tooth (pulp) is affected, a procedure called a root canal will be needed before a filling or crown can be placed.   If most of the tooth has been destroyed, the tooth may need to be pulled (extracted). HOME CARE INSTRUCTIONS You can prevent, stop, or reverse dental caries at home by practicing good oral hygiene. Good oral hygiene includes:  Thoroughly cleaning your teeth at least twice a day with a toothbrush and dental floss.   Using a fluoride toothpaste. A fluoride mouth rinse may also be used if recommended by your dentist or health care provider.   Restricting the amount of sugary and starchy foods and sugary liquids you consume.   Avoiding frequent snacking on these foods and sipping of these liquids.   Keeping regular visits with  a dentist for checkups and cleanings. PREVENTION   Practice good oral hygiene.  Consider a dental sealant. A dental sealant is a coating material that is applied by your dentist to the pits and grooves of teeth. The sealant prevents food from being trapped in them. It may protect the teeth for several years.  Ask about fluoride supplements if you live in a community without fluorinated water or with water that has a low fluoride content. Use fluoride supplements  as directed by your dentist or health care provider.  Allow fluoride varnish applications to teeth if directed by your dentist or health care provider.   This information is not intended to replace advice given to you by your health care provider. Make sure you discuss any questions you have with your health care provider.   Document Released: 07/24/2002 Document Revised: 11/22/2014 Document Reviewed: 11/03/2012 Elsevier Interactive Patient Education [employer].

## 2016-03-04 NOTE — ED Notes (Signed)
Patient c/o upper and lower left dental pain. Patient states the teeth have broken off. Patient has slight swelling to the left face. Patient states he has pain when he swallows.

## 2016-04-23 ENCOUNTER — Ambulatory Visit (INDEPENDENT_AMBULATORY_CARE_PROVIDER_SITE_OTHER): Payer: Self-pay | Admitting: Family Medicine

## 2016-04-23 ENCOUNTER — Encounter: Payer: Self-pay | Admitting: Family Medicine

## 2016-04-23 VITALS — BP 116/70 | HR 73 | Temp 98.5°F | Resp 16 | Ht 74.0 in | Wt 210.0 lb

## 2016-04-23 DIAGNOSIS — G629 Polyneuropathy, unspecified: Secondary | ICD-10-CM

## 2016-04-23 DIAGNOSIS — K50919 Crohn's disease, unspecified, with unspecified complications: Secondary | ICD-10-CM

## 2016-04-23 DIAGNOSIS — E1140 Type 2 diabetes mellitus with diabetic neuropathy, unspecified: Secondary | ICD-10-CM

## 2016-04-23 DIAGNOSIS — F17200 Nicotine dependence, unspecified, uncomplicated: Secondary | ICD-10-CM

## 2016-04-23 DIAGNOSIS — K029 Dental caries, unspecified: Secondary | ICD-10-CM

## 2016-04-23 LAB — POCT URINALYSIS DIP (DEVICE)
Bilirubin Urine: NEGATIVE
Glucose, UA: 100 mg/dL — AB
Ketones, ur: NEGATIVE mg/dL
Leukocytes, UA: NEGATIVE
Nitrite: NEGATIVE
Protein, ur: NEGATIVE mg/dL
Specific Gravity, Urine: >=1.03 (ref 1.005–1.030)
Urobilinogen, UA: 0.2 mg/dL (ref 0.0–1.0)
pH: 5.5 (ref 5.0–8.0)

## 2016-04-23 LAB — CBC WITH DIFFERENTIAL/PLATELET
Basophils Absolute: 0 cells/uL (ref 0–200)
Basophils Relative: 0 %
Eosinophils Absolute: 57 cells/uL (ref 15–500)
Eosinophils Relative: 1 %
HCT: 41.3 % (ref 38.5–50.0)
Hemoglobin: 13.6 g/dL (ref 13.2–17.1)
Lymphocytes Relative: 30 %
Lymphs Abs: 1710 cells/uL (ref 850–3900)
MCH: 28.6 pg (ref 27.0–33.0)
MCHC: 32.9 g/dL (ref 32.0–36.0)
MCV: 86.9 fL (ref 80.0–100.0)
MPV: 9.9 fL (ref 7.5–12.5)
Monocytes Absolute: 570 cells/uL (ref 200–950)
Monocytes Relative: 10 %
Neutro Abs: 3363 cells/uL (ref 1500–7800)
Neutrophils Relative %: 59 %
Platelets: 238 10*3/uL (ref 140–400)
RBC: 4.75 MIL/uL (ref 4.20–5.80)
RDW: 14.9 % (ref 11.0–15.0)
WBC: 5.7 10*3/uL (ref 3.8–10.8)

## 2016-04-23 LAB — COMPLETE METABOLIC PANEL WITH GFR
ALT: 17 U/L (ref 9–46)
AST: 14 U/L (ref 10–35)
Albumin: 3.4 g/dL — ABNORMAL LOW (ref 3.6–5.1)
Alkaline Phosphatase: 68 U/L (ref 40–115)
BUN: 11 mg/dL (ref 7–25)
CO2: 22 mmol/L (ref 20–31)
Calcium: 8.8 mg/dL (ref 8.6–10.3)
Chloride: 110 mmol/L (ref 98–110)
Creat: 0.98 mg/dL (ref 0.70–1.33)
GFR, Est African American: >89 mL/min (ref >=60)
GFR, Est Non African American: >89 mL/min (ref >=60)
Glucose, Bld: 149 mg/dL — ABNORMAL HIGH (ref 65–99)
Potassium: 4 mmol/L (ref 3.5–5.3)
Sodium: 143 mmol/L (ref 135–146)
Total Bilirubin: 0.4 mg/dL (ref 0.2–1.2)
Total Protein: 6.3 g/dL (ref 6.1–8.1)

## 2016-04-23 LAB — HEMOGLOBIN A1C
Hgb A1c MFr Bld: 10.1 % — ABNORMAL HIGH (ref <5.7)
Mean Plasma Glucose: 243 mg/dL

## 2016-04-23 LAB — GLUCOSE, CAPILLARY: Glucose-Capillary: 171 mg/dL — ABNORMAL HIGH (ref 65–99)

## 2016-04-23 MED ORDER — GABAPENTIN 100 MG PO CAPS: 100.0000 mg | ORAL_CAPSULE | Freq: Every day | ORAL | Status: DC

## 2016-04-23 MED ORDER — DICYCLOMINE HCL 20 MG PO TABS: 20.0000 mg | ORAL_TABLET | Freq: Two times a day (BID) | ORAL | Status: DC

## 2016-04-23 MED FILL — GABAPENTIN 100 MG CAPSULE: 100 | 30 days supply | Qty: 30 | Fill #0

## 2016-04-23 MED FILL — DICYCLOMINE 10 MG CAPSULE: 10 | 10 days supply | Qty: 20 | Fill #0

## 2016-04-23 NOTE — Patient Instructions (Signed)
Diabetes and Exercise Exercising regularly is important. It is not just about losing weight. It has many health benefits, such as:  Improving your overall fitness, flexibility, and endurance.  Increasing your bone density.  Helping with weight control.  Decreasing your body fat.  Increasing your muscle strength.  Reducing stress and tension.  Improving your overall health. People with diabetes who exercise gain additional benefits because exercise:  Reduces appetite.  Improves the body's use of blood sugar (glucose).  Helps lower or control blood glucose.  Decreases blood pressure.  Helps control blood lipids (such as cholesterol and triglycerides).  Improves the body's use of the hormone insulin by:  Increasing the body's insulin sensitivity.  Reducing the body's insulin needs.  Decreases the risk for heart disease because exercising:  Lowers cholesterol and triglycerides levels.  Increases the levels of good cholesterol (such as high-density lipoproteins [HDL]) in the body.  Lowers blood glucose levels. YOUR ACTIVITY PLAN  Choose an activity that you enjoy, and set realistic goals. To exercise safely, you should begin practicing any new physical activity slowly, and gradually increase the intensity of the exercise over time. Your health care provider or diabetes educator can help create an activity plan that works for you. General recommendations include:  Encouraging children to engage in at least 60 minutes of physical activity each day.  Stretching and performing strength training exercises, such as yoga or weight lifting, at least 2 times per week.  Performing a total of at least 150 minutes of moderate-intensity exercise each week, such as brisk walking or water aerobics.  Exercising at least 3 days per week, making sure you allow no more than 2 consecutive days to pass without exercising.  Avoiding long periods of inactivity (90 minutes or more). When you  have to spend an extended period of time sitting down, take frequent breaks to walk or stretch. RECOMMENDATIONS FOR EXERCISING WITH TYPE 1 OR TYPE 2 DIABETES   Check your blood glucose before exercising. If blood glucose levels are greater than 240 mg/dL, check for urine ketones. Do not exercise if ketones are present.  Avoid injecting insulin into areas of the body that are going to be exercised. For example, avoid injecting insulin into:  The arms when playing tennis.  The legs when jogging.  Keep a record of:  Food intake before and after you exercise.  Expected peak times of insulin action.  Blood glucose levels before and after you exercise.  The type and amount of exercise you have done.  Review your records with your health care provider. Your health care provider will help you to develop guidelines for adjusting food intake and insulin amounts before and after exercising.  If you take insulin or oral hypoglycemic agents, watch for signs and symptoms of hypoglycemia. They include:  Dizziness.  Shaking.  Sweating.  Chills.  Confusion.  Drink plenty of water while you exercise to prevent dehydration or heat stroke. Body water is lost during exercise and must be replaced.  Talk to your health care provider before starting an exercise program to make sure it is safe for you. Remember, almost any type of activity is better than none.   This information is not intended to replace advice given to you by your health care provider. Make sure you discuss any questions you have with your health care provider.   Document Released: 01/22/2004 Document Revised: 03/18/2015 Document Reviewed: 04/10/2013 Elsevier Interactive Patient Education 2016 Elsevier Inc.  

## 2016-04-23 NOTE — Progress Notes (Signed)
Subjective:    Patient ID: Jared [name], male    DOB: 10-03-1966, 50 y.o.   MRN: [id number]  HPI Jared [name], a 50 year old male with a history of type 2 diabetes mellitus and Crohn's diseases presents for a follow up of chronic conditions. Patient has not been taking metformin or following a low carbohydrate diet over the past year. Patient reports numbness and tingling to lower extremities primarily at night. Patient denies foot ulcerations, increase appetite, nausea,  polydipsia, polyuria, visual disturbances, vomitting and weight loss. Evaluation to date has been included: fasting blood sugar and hemoglobin A1C. He states that he has not been checking blood sugars at home and is currently not taking medications due to cost constraints.    Jared [name] reports a history of Crohn's disease which has previously been managed by Dr. Amedeo Plenty at [facility]. He also reports a h/o small bowel resection due to SBO secondary to strictures associated with Crohn's disease. He has not had consistent care for Crohn's disease over the past 2 years due to unemployment and loss of health insurance.  He reports that Crohn's has been stable over the past year. He denies fever, chills, abdominal pain,  nausea, vomiting, and diarrhea.   Past Medical History  Diagnosis Date  . Crohn disease ([place]) 2008    with hospital admission in 2011, and 8/12 for flare up and SBO relieved with bowel rest. no suregery, no meds for CD  . Diabetes mellitus type II    Social History   Social History  . Marital Status: Single    Spouse Name: N/A  . Number of Children: N/A  . Years of Education: N/A   Occupational History  . Not on file.   Social History Main Topics  . Smoking status: Current Every Day Smoker -- 0.25 packs/day for 30 years    Types: Cigarettes  . Smokeless tobacco: Former Systems developer    Types: Chew    Quit date: 01/20/1997  . Alcohol Use: No  . Drug Use: No  . Sexual Activity: Not Currently   Other Topics  Concern  . Not on file   Social History Narrative   Lives in [place].Is single. Has a sister in [place]. Smokes 2-3 cigarettes a day. Drinks beer once every few months. No history of drug abuse. Studied up to 12th grade. Has orange card and [facility] . No health insurance. 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    Review of Systems  Constitutional: Negative.  Negative for fever.  HENT: Positive for dental problem.   Eyes: Negative.   Respiratory: Negative.   Cardiovascular: Negative.   Gastrointestinal: Negative.   Endocrine: Negative.  Negative for polydipsia, polyphagia and polyuria.  Genitourinary: Negative.   Musculoskeletal: Negative.   Skin: Negative.   Allergic/Immunologic: Negative.   Neurological: Positive for numbness (lower extremities).  Hematological: Negative.   Psychiatric/Behavioral: Negative.        Objective:   Physical Exam  Constitutional: He is oriented to person, place, and time. Vital signs are normal. He appears well-developed and well-nourished.  HENT:  Head: Normocephalic and atraumatic.  Right Ear: Hearing, tympanic membrane, external ear and ear canal normal.  Left Ear: Hearing, tympanic membrane, external ear and ear canal normal.  Nose: Nose normal.  Mouth/Throat: Oropharynx is clear and moist. He does not have dentures. Abnormal dentition. Dental caries present.  Eyes: Conjunctivae are normal. Pupils are equal, round, and reactive to light.  Neck: Normal  range of motion. Neck supple.  Cardiovascular: Normal rate, regular rhythm and normal heart sounds.   Pulmonary/Chest: Effort normal and breath sounds normal.  Abdominal: Soft. Bowel sounds are normal.  Musculoskeletal: Normal range of motion.  Neurological: He is alert and oriented to person, place, and time. He has normal reflexes.  Skin: Skin is warm and dry.  Psychiatric: He has a normal mood and affect. His speech is normal and behavior is  normal. Judgment and thought content normal.       BP 116/70 mmHg  Pulse 73  Temp(Src) 98.5 F (36.9 C) (Oral)  Resp 16  Ht 6' 2"  (1.88 m)  Wt 210 lb (95.255 kg)  BMI 26.95 kg/m2  SpO2 100% Assessment & Plan:  1. Type 2 diabetes mellitus with diabetic neuropathy, without long-term current use of insulin (HCC) Previous hemoglobin a1C was 8.9. Patient has not been taking Metformin as scheduled and has been lost to follow up. There have been some compliance issues here. I have discussed with him the great importance of following the treatment plan exactly as directed in order to achieve a good medical outcome. - Hemoglobin A1c - COMPLETE METABOLIC PANEL WITH GFR - Urinalysis Dipstick - Microalbumin/Creatinine Ratio, Urine  2. Crohn's disease with complication, unspecified gastrointestinal tract location ([place]) - CBC with Differential - dicyclomine (BENTYL) 20 MG tablet; Take 1 tablet (20 mg total) by mouth 2 (two) times daily.  Dispense: 20 tablet; Refill: 0  3. Dental caries - Ambulatory referral to Dentistry  4. Neuropathy (HCC) - gabapentin (NEURONTIN) 100 MG capsule; Take 1 capsule (100 mg total) by mouth at bedtime.  Dispense: 30 capsule; Refill: 2  5. Tobacco dependence Smoking cessation instruction/counseling given:  counseled patient on the dangers of tobacco use, advised patient to stop smoking, and reviewed strategies to maximize success         RTC: Will schedule follow up appointment after reviewing laboratory values Dorena Dew, FNP

## 2016-04-26 ENCOUNTER — Other Ambulatory Visit: Payer: Self-pay | Admitting: Family Medicine

## 2016-04-26 DIAGNOSIS — E1140 Type 2 diabetes mellitus with diabetic neuropathy, unspecified: Secondary | ICD-10-CM

## 2016-04-26 MED ORDER — GLUCOSE BLOOD VI STRP: ORAL_STRIP | Status: DC

## 2016-04-26 MED ORDER — TRUE METRIX METER W/DEVICE KIT: 1.0000 | PACK | Freq: Three times a day (TID) | Status: DC

## 2016-04-26 MED ORDER — METFORMIN HCL 500 MG PO TABS: 500.0000 mg | ORAL_TABLET | Freq: Two times a day (BID) | ORAL | Status: DC

## 2016-04-26 MED ORDER — INSULIN GLARGINE 100 UNIT/ML ~~LOC~~ SOLN: 10.0000 [IU] | Freq: Every day | SUBCUTANEOUS | Status: DC

## 2016-04-26 MED FILL — metFORMIN HCL 500 MG TABS: 500 | 30 days supply | Qty: 60 | Fill #0

## 2016-04-26 MED FILL — TRUE METRIX TEST STRIP: 25 days supply | Qty: 100 | Fill #0

## 2016-04-26 MED FILL — TRUE METRIX BLOOD GLUCOSE M: W/DEVICE | 365 days supply | Qty: 1 | Fill #0

## 2016-04-26 MED FILL — !LANTUS SOLOSTAR 100UNITS/M: 100 | 30 days supply | Qty: 3 | Fill #0

## 2016-04-26 NOTE — Progress Notes (Signed)
Patient returned call and I advised of labs and the need to start lantus 10 units every night at bedtime and metformin as directed. Patient verbalized understanding and appointment was scheduled for 1 month. Thanks!

## 2016-04-26 NOTE — Progress Notes (Signed)
Called, no answer. Left message to return call. Thanks!

## 2016-05-07 ENCOUNTER — Other Ambulatory Visit: Payer: Self-pay | Admitting: *Deleted

## 2016-05-07 DIAGNOSIS — E1140 Type 2 diabetes mellitus with diabetic neuropathy, unspecified: Secondary | ICD-10-CM

## 2016-05-07 MED ORDER — INSULIN GLARGINE 100 UNIT/ML ~~LOC~~ SOLN: 10.0000 [IU] | Freq: Every day | SUBCUTANEOUS | Status: DC

## 2016-05-07 NOTE — Telephone Encounter (Signed)
PASS PROGRAM

## 2016-05-27 ENCOUNTER — Telehealth: Payer: Self-pay | Admitting: *Deleted

## 2016-05-27 NOTE — Telephone Encounter (Signed)
Patient called states that the  Gabapentin is causing some issues. Patient states that he was having suicidal  thoughts while he was on this medication. He stated he stopped taking the medication. Patient stated that he has been up for 4 days with the same thoughts. I talked to the NP Sharon Seller. She advise the patient to be evaluated at the emergency room today. Informed the patient that he needed to be evaluated at the emergency room. Patient verbalized understanding. I will let the RN Mickel Baas know about this as well.

## 2016-05-28 MED FILL — $LANTUS SOLOSTAR 100 UNITS/: 100 | 30 days supply | Qty: 3 | Fill #1

## 2016-05-28 MED FILL — metFORMIN HCL 500 MG TABS: 500 | 30 days supply | Qty: 60 | Fill #1

## 2016-06-07 ENCOUNTER — Ambulatory Visit: Payer: Self-pay | Admitting: Family Medicine

## 2016-06-17 ENCOUNTER — Ambulatory Visit: Payer: Self-pay | Admitting: Family Medicine

## 2016-07-14 ENCOUNTER — Encounter: Payer: Self-pay | Admitting: Family Medicine

## 2016-07-14 ENCOUNTER — Ambulatory Visit (INDEPENDENT_AMBULATORY_CARE_PROVIDER_SITE_OTHER): Payer: Self-pay | Admitting: Family Medicine

## 2016-07-14 VITALS — BP 123/70 | HR 77 | Temp 98.3°F | Resp 16 | Ht 74.0 in | Wt 214.0 lb

## 2016-07-14 DIAGNOSIS — E118 Type 2 diabetes mellitus with unspecified complications: Secondary | ICD-10-CM

## 2016-07-14 DIAGNOSIS — Z114 Encounter for screening for human immunodeficiency virus [HIV]: Secondary | ICD-10-CM

## 2016-07-14 DIAGNOSIS — Z125 Encounter for screening for malignant neoplasm of prostate: Secondary | ICD-10-CM

## 2016-07-14 LAB — HIV ANTIBODY (ROUTINE TESTING W REFLEX): HIV 1&2 Ab, 4th Generation: NONREACTIVE

## 2016-07-14 LAB — PSA: PSA: 0.9 ng/mL (ref <=4.0)

## 2016-07-14 LAB — POCT GLYCOSYLATED HEMOGLOBIN (HGB A1C): Hemoglobin A1C: 9.6

## 2016-07-14 MED ORDER — METFORMIN HCL 1000 MG PO TABS: 1000.0000 mg | ORAL_TABLET | Freq: Two times a day (BID) | ORAL | 3 refills | Status: DC

## 2016-07-14 MED ORDER — NAPROXEN 500 MG PO TABS: 500.0000 mg | ORAL_TABLET | Freq: Two times a day (BID) | ORAL | 1 refills | Status: DC

## 2016-07-14 MED ORDER — "PEN NEEDLES 1/2"" 29G X 12MM MISC": 1 refills | Status: DC

## 2016-07-14 MED FILL — BD ULTRA FINE PEN NEEDLES: 29G X 12.7M | 50 days supply | Qty: 100 | Fill #0

## 2016-07-14 MED FILL — NAPROXEN 500 MG TABLET: 500 | 30 days supply | Qty: 60 | Fill #0

## 2016-07-14 MED FILL — ?METFORMIN HCL 1,000 MG TAB: 1000 | 30 days supply | Qty: 60 | Fill #0

## 2016-07-14 NOTE — Progress Notes (Signed)
Jared [name], is a 50 y.o. male  RXY:[id number]  [id number]:[id number]  DOB - September 16, 1966  CC:  Chief Complaint  Patient presents with  . Diabetes       HPI: Jared [name] is a 49 y.o. male here for follow-up diabetes and hypertension. He reports generally doing well. He reports checking his blood sugars at about midnight and 130 last night at midnight. He was started on Lantus 10 units on his last visit about 3 months. He has also been on metformin 500 bid. His A1C was 10.1 at last visit. It is 9.6 today. He reports only needing refills on pen needles and strips. He reports eating low fat, low carbs and walking several days at week. He smokes 1/2 pack of cigarettes daily. Is not ready to quit.  No Known Allergies Past Medical History:  Diagnosis Date  . Crohn disease ([place]) 2008   with hospital admission in 2011, and 8/12 for flare up and SBO relieved with bowel rest. no suregery, no meds for CD  . Diabetes mellitus type II    Current Outpatient Prescriptions on File Prior to Visit  Medication Sig Dispense Refill  . Blood Glucose Monitoring Suppl (TRUE METRIX METER) w/Device KIT 1 each by Does not apply route 4 (four) times daily -  before meals and at bedtime. 1 kit 0  . glucose blood (TRUE METRIX BLOOD GLUCOSE TEST) test strip Use as instructed 100 each 12  . insulin glargine (LANTUS) 100 UNIT/ML injection Inject 0.1 mLs (10 Units total) into the skin at bedtime. 15 mL 3  . dicyclomine (BENTYL) 20 MG tablet Take 1 tablet (20 mg total) by mouth 2 (two) times daily. (Patient not taking: Reported on 07/14/2016) 20 tablet 0  . gabapentin (NEURONTIN) 100 MG capsule Take 1 capsule (100 mg total) by mouth at bedtime. (Patient not taking: Reported on 07/14/2016) 30 capsule 2  . varenicline (CHANTIX) 0.5 MG tablet Take 1 tablet (0.5 mg total) by mouth 2 (two) times daily. (Patient not taking: Reported on 04/23/2016) 60 tablet 1  . [DISCONTINUED] furosemide (LASIX) 10 MG/ML solution Take by mouth daily.     . [DISCONTINUED] mesalamine (PENTASA) 250 MG CR capsule Take 1,000 mg by mouth 4 (four) times daily.    . [DISCONTINUED] pioglitazone (ACTOS) 15 MG tablet Take by mouth daily.     No current facility-administered medications on file prior to visit.    Family History  Problem Relation Age of Onset  . Cancer Mother     lung  . Diabetes Mother   . Alopecia Mother   . Coronary artery disease Mother   . Diabetes Sister   . Hyperlipidemia Sister   . Hypertension Sister   . Angina Brother   . Hepatitis Brother   . Alcohol abuse Brother   . Diabetes Brother    Social History   Social History  . Marital status: Single    Spouse name: N/A  . Number of children: N/A  . Years of education: N/A   Occupational History  . Not on file.   Social History Main Topics  . Smoking status: Current Every Day Smoker    Packs/day: 0.25    Years: 30.00    Types: Cigarettes  . Smokeless tobacco: Former Systems developer    Types: [place] date: 01/20/1997  . Alcohol use No  . Drug use: No  . Sexual activity: Not Currently   Other Topics Concern  . Not on file   Social History  Narrative   Lives in [place].Is single. Has a sister in [place]. Smokes 2-3 cigarettes a day. Drinks beer once every few months. No history of drug abuse. Studied up to 12th grade. Has orange card and [facility] . No health insurance. 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    Review of Systems: Constitutional: Negative for fever, chills, appetite change, weight loss,  Fatigue. Skin: Negative for rashes or lesions of concern. HENT: Negative for ear pain, ear discharge.nose bleeds Eyes: Negative for pain, discharge, redness, itching and visual disturbance. Neck: Negative for pain, stiffness Respiratory: Negative for cough, shortness of breath,   Cardiovascular: Negative for chest pain, palpitations. Positive for occ swelling of ankles and feet.. Gastrointestinal: Negative for   nausea, vomiting, constipations. Occcasional abd pain and diarrhea related to Chrons Disease Genitourinary: Negative for dysuria, urgency, frequency, hematuria,  Musculoskeletal: Positive for back, shoulder, hips and knee pain Neurological: Negative for dizziness, tremors, seizures, syncope,   light-headedness, numbness and headaches.  Hematological: Negative for easy bruising or bleeding Psychiatric/Behavioral: Negative for depression, anxiety, decreased concentration, confusion   Objective:   Vitals:   07/14/16 1046  BP: 123/70  Pulse: 77  Resp: 16  Temp: 98.3 F (36.8 C)    Physical Exam: Constitutional: Patient appears well-developed and well-nourished. No distress. HENT: Normocephalic, atraumatic, External right and left ear normal. Oropharynx is clear and moist.  Eyes: Conjunctivae and EOM are normal. PERRLA, no scleral icterus. Neck: Normal ROM. Neck supple. No lymphadenopathy, No thyromegaly. CVS: RRR, S1/S2 +, no murmurs, no gallops, no rubs Pulmonary: Effort and breath sounds normal, no stridor, rhonchi, wheezes, rales.  Abdominal: Soft. Normoactive BS,, no distension, tenderness, rebound or guarding.  Musculoskeletal: Normal range of motion. No edema and no tenderness.  Neuro: Alert.Normal muscle tone coordination. Non-focal Skin: Skin is warm and dry. No rash noted. Not diaphoretic. No erythema. No pallor. Psychiatric: Normal mood and affect. Behavior, judgment, thought content normal. Foot exam: normal.  Lab Results  Component Value Date   WBC 5.7 04/23/2016   HGB 13.6 04/23/2016   HCT 41.3 04/23/2016   MCV 86.9 04/23/2016   PLT 238 04/23/2016   Lab Results  Component Value Date   CREATININE 0.98 04/23/2016   BUN 11 04/23/2016   NA 143 04/23/2016   K 4.0 04/23/2016   CL 110 04/23/2016   CO2 22 04/23/2016    Lab Results  Component Value Date   HGBA1C 9.6 07/14/2016   Lipid Panel     Component Value Date/Time   CHOL 160 06/27/2014 1442   TRIG 192  (H) 06/27/2014 1442   HDL 39 (L) 06/27/2014 1442   CHOLHDL 4.1 06/27/2014 1442   VLDL 38 06/27/2014 1442   LDLCALC 83 06/27/2014 1442       Assessment and plan:   1. Type 2 diabetes mellitus with complication, unspecified long term insulin use status (HCC)  - HgB A1c - Microalbumin, urine -I have asked him to increase his Lantus one unit a day for five days. Check his blood sugars before breakfast and 2 hours after dinner -I have also increased his metformin to 1000 bid and have reminded him to take approx 12 hours apart. -Refill on needed supplies  2. Screening for HIV (human immunodeficiency virus)  - HIV antibody (with reflex)  3. Screening for prostate cancer  - PSA  4. Musculoskeletal pain -naproxen 500 mg, #60, one po Bid with food   Return in about 1 year (around 07/14/2017) for Nurse  to review meter..  The patient was given clear instructions to go to ER or return to medical center if symptoms don't improve, worsen or new problems develop. The patient verbalized understanding.    Micheline Chapman FNP  07/14/2016, 1:22 PM

## 2016-07-14 NOTE — Patient Instructions (Signed)
Increase Lantus by one unit a night for next 5 nights. Check your blood sugars before breakfast and 2 hours after dinner. Bring you meter by in one month for nurse to review.a Continue low carb, low fat diet Exercise regularly.

## 2016-07-15 LAB — MICROALBUMIN, URINE: Microalb, Ur: 0.8 mg/dL

## 2016-07-16 MED FILL — $LANTUS SOLOSTAR 100 UNITS/: 100 | 30 days supply | Qty: 3 | Fill #2

## 2016-08-13 ENCOUNTER — Ambulatory Visit: Payer: Self-pay

## 2016-09-03 MED FILL — ?METFORMIN HCL 500MG TABLET: 500 | 30 days supply | Qty: 60 | Fill #2

## 2016-09-03 MED FILL — $LANTUS SOLOSTAR 100 UNITS/: 100 | 30 days supply | Qty: 3 | Fill #3

## 2016-09-29 ENCOUNTER — Other Ambulatory Visit: Payer: Self-pay | Admitting: Family Medicine

## 2016-09-29 MED ORDER — IBUPROFEN 800 MG PO TABS: 800.0000 mg | ORAL_TABLET | Freq: Three times a day (TID) | ORAL | 2 refills | Status: DC | PRN

## 2016-10-05 ENCOUNTER — Telehealth: Payer: Self-pay | Admitting: Family Medicine

## 2016-10-05 NOTE — Telephone Encounter (Signed)
Called and left pt a message to inform him that his prescription is ready to be picked up. He needs to do so by Monday 11/27 (by 3pm).

## 2016-10-18 ENCOUNTER — Other Ambulatory Visit: Payer: Self-pay | Admitting: Family Medicine

## 2016-10-18 DIAGNOSIS — E1140 Type 2 diabetes mellitus with diabetic neuropathy, unspecified: Secondary | ICD-10-CM

## 2016-10-18 MED FILL — $LANTUS SOLOSTAR 100 UNITS/: 100 | 30 days supply | Qty: 3 | Fill #4

## 2016-10-18 MED FILL — NAPROXEN 500 MG TABLET: 500 | 30 days supply | Qty: 60 | Fill #1

## 2016-10-18 MED FILL — metFORMIN HCL 500 MG TABS: 500 | 30 days supply | Qty: 60 | Fill #0

## 2016-11-25 ENCOUNTER — Emergency Department (HOSPITAL_COMMUNITY)
Admission: EM | Admit: 2016-11-25 | Discharge: 2016-11-25 | Disposition: A | Discharge status: Home / Self Care | Payer: Self-pay | Attending: Emergency Medicine | Admitting: Emergency Medicine

## 2016-11-25 ENCOUNTER — Emergency Department (HOSPITAL_COMMUNITY): Payer: Self-pay

## 2016-11-25 ENCOUNTER — Encounter (HOSPITAL_COMMUNITY): Payer: Self-pay

## 2016-11-25 DIAGNOSIS — E119 Type 2 diabetes mellitus without complications: Secondary | ICD-10-CM | POA: Insufficient documentation

## 2016-11-25 DIAGNOSIS — Y929 Unspecified place or not applicable: Secondary | ICD-10-CM | POA: Insufficient documentation

## 2016-11-25 DIAGNOSIS — Y999 Unspecified external cause status: Secondary | ICD-10-CM | POA: Insufficient documentation

## 2016-11-25 DIAGNOSIS — F17210 Nicotine dependence, cigarettes, uncomplicated: Secondary | ICD-10-CM | POA: Insufficient documentation

## 2016-11-25 DIAGNOSIS — Y939 Activity, unspecified: Secondary | ICD-10-CM | POA: Insufficient documentation

## 2016-11-25 DIAGNOSIS — S46911A Strain of unspecified muscle, fascia and tendon at shoulder and upper arm level, right arm, initial encounter: Secondary | ICD-10-CM | POA: Insufficient documentation

## 2016-11-25 DIAGNOSIS — X58XXXA Exposure to other specified factors, initial encounter: Secondary | ICD-10-CM | POA: Insufficient documentation

## 2016-11-25 DIAGNOSIS — Z79899 Other long term (current) drug therapy: Secondary | ICD-10-CM | POA: Insufficient documentation

## 2016-11-25 DIAGNOSIS — Z794 Long term (current) use of insulin: Secondary | ICD-10-CM | POA: Insufficient documentation

## 2016-11-25 NOTE — ED Triage Notes (Signed)
Pt injured his right shoulder tonight while opening a box, he can move it back and forth but unable to lift his arm
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
## 2016-11-29 ENCOUNTER — Other Ambulatory Visit: Payer: Self-pay | Admitting: Family Medicine

## 2016-11-29 MED FILL — metFORMIN HCL 500 MG TABS: 500 | 30 days supply | Qty: 60 | Fill #1

## 2016-11-29 MED FILL — $LANTUS SOLOSTAR 100 UNITS/: 100 | 30 days supply | Qty: 3 | Fill #5

## 2016-12-23 MED FILL — IBUPROFEN 800 MG TABLET: 800 | 10 days supply | Qty: 30 | Fill #0

## 2017-01-10 MED FILL — IBUPROFEN 800 MG TABLET: 800 | 10 days supply | Qty: 30 | Fill #1

## 2017-01-10 MED FILL — metFORMIN HCL 500 MG TABS: 500 | 30 days supply | Qty: 60 | Fill #2

## 2017-02-18 MED FILL — IBUPROFEN 800 MG TABLET: 800 | 10 days supply | Qty: 30 | Fill #2

## 2017-02-28 ENCOUNTER — Other Ambulatory Visit: Payer: Self-pay | Admitting: Family Medicine

## 2017-02-28 DIAGNOSIS — E1140 Type 2 diabetes mellitus with diabetic neuropathy, unspecified: Secondary | ICD-10-CM

## 2017-03-01 MED FILL — ?METFORMIN HCL 500MG TABLET: 500 | 30 days supply | Qty: 60 | Fill #0

## 2017-03-22 ENCOUNTER — Other Ambulatory Visit: Payer: Self-pay | Admitting: Family Medicine

## 2017-04-12 MED FILL — $LANTUS SOLOSTAR 100 UNITS/: 100 | 28 days supply | Qty: 3 | Fill #6

## 2017-04-12 MED FILL — ?METFORMIN HCL 500MG TABLET: 500 | 30 days supply | Qty: 60 | Fill #1

## 2017-04-13 ENCOUNTER — Other Ambulatory Visit: Payer: Self-pay

## 2017-04-13 MED ORDER — IBUPROFEN 800 MG PO TABS: 800.0000 mg | ORAL_TABLET | Freq: Three times a day (TID) | ORAL | 2 refills | Status: DC | PRN

## 2017-04-13 MED FILL — IBUPROFEN 800 MG TABLET: 800 | 10 days supply | Qty: 30 | Fill #0

## 2017-04-13 NOTE — Telephone Encounter (Signed)
Refilled ibuprofen to pharmacy. Thanks!

## 2017-04-18 ENCOUNTER — Ambulatory Visit: Payer: Self-pay | Admitting: Family Medicine

## 2017-05-09 MED FILL — IBUPROFEN 800 MG TABLET: 800 | 10 days supply | Qty: 30 | Fill #1

## 2017-05-16 MED FILL — ?METFORMIN HCL 500MG TABLET: 500 | 30 days supply | Qty: 60 | Fill #2

## 2017-06-06 ENCOUNTER — Other Ambulatory Visit: Payer: Self-pay | Admitting: Family Medicine

## 2017-06-06 MED FILL — IBUPROFEN 800 MG TABLET: 800 | 10 days supply | Qty: 30 | Fill #2

## 2017-06-27 ENCOUNTER — Other Ambulatory Visit: Payer: Self-pay | Admitting: Family Medicine

## 2017-06-27 DIAGNOSIS — E1140 Type 2 diabetes mellitus with diabetic neuropathy, unspecified: Secondary | ICD-10-CM

## 2017-06-27 MED FILL — ?METFORMIN HCL 500MG TABLET: 500 | 30 days supply | Qty: 60 | Fill #0

## 2017-06-29 ENCOUNTER — Other Ambulatory Visit: Payer: Self-pay | Admitting: Family Medicine

## 2017-06-29 DIAGNOSIS — E1140 Type 2 diabetes mellitus with diabetic neuropathy, unspecified: Secondary | ICD-10-CM

## 2017-07-06 ENCOUNTER — Encounter: Payer: Self-pay | Admitting: Family Medicine

## 2017-07-06 ENCOUNTER — Ambulatory Visit (INDEPENDENT_AMBULATORY_CARE_PROVIDER_SITE_OTHER): Payer: Self-pay | Admitting: Family Medicine

## 2017-07-06 VITALS — BP 142/58 | HR 63 | Temp 98.0°F | Resp 16 | Ht 74.0 in | Wt 208.0 lb

## 2017-07-06 DIAGNOSIS — M25511 Pain in right shoulder: Secondary | ICD-10-CM

## 2017-07-06 DIAGNOSIS — G629 Polyneuropathy, unspecified: Secondary | ICD-10-CM

## 2017-07-06 DIAGNOSIS — E785 Hyperlipidemia, unspecified: Secondary | ICD-10-CM

## 2017-07-06 DIAGNOSIS — F17200 Nicotine dependence, unspecified, uncomplicated: Secondary | ICD-10-CM

## 2017-07-06 DIAGNOSIS — E1140 Type 2 diabetes mellitus with diabetic neuropathy, unspecified: Secondary | ICD-10-CM

## 2017-07-06 LAB — POCT URINALYSIS DIP (DEVICE)
Bilirubin Urine: NEGATIVE
Glucose, UA: NEGATIVE mg/dL
Hgb urine dipstick: NEGATIVE
Leukocytes, UA: NEGATIVE
Nitrite: NEGATIVE
Protein, ur: NEGATIVE mg/dL
Specific Gravity, Urine: 1.025 (ref 1.005–1.030)
Urobilinogen, UA: 1 mg/dL (ref 0.0–1.0)
pH: 6.5 (ref 5.0–8.0)

## 2017-07-06 LAB — GLUCOSE, CAPILLARY: Glucose-Capillary: 105 mg/dL — ABNORMAL HIGH (ref 65–99)

## 2017-07-06 LAB — POCT GLYCOSYLATED HEMOGLOBIN (HGB A1C): Hemoglobin A1C: 6.6

## 2017-07-06 MED ORDER — GLUCOSE BLOOD VI STRP: ORAL_STRIP | 12 refills | Status: DC

## 2017-07-06 MED ORDER — VARENICLINE TARTRATE 1 MG PO TABS: 1.0000 mg | ORAL_TABLET | Freq: Two times a day (BID) | ORAL | 1 refills | Status: DC

## 2017-07-06 MED ORDER — INSULIN GLARGINE 100 UNIT/ML ~~LOC~~ SOLN: 10.0000 [IU] | Freq: Every day | SUBCUTANEOUS | 3 refills | Status: DC

## 2017-07-06 MED ORDER — TRUE METRIX METER W/DEVICE KIT: 1.0000 | PACK | Freq: Three times a day (TID) | 0 refills | Status: DC

## 2017-07-06 MED FILL — TRUE METRIX BLOOD GLUCOSE M: W/DEVICE | 365 days supply | Qty: 1 | Fill #0

## 2017-07-06 MED FILL — TRUE METRIX TEST STRIP: 30 days supply | Qty: 100 | Fill #0

## 2017-07-06 NOTE — Patient Instructions (Addendum)
Hemogloibin a1c is 6.6, which is at goal. No medication changes warranted on today. Your A1C goal is less than 7. Your fasting blood sugar  Upon awakening goal is between 110-140.  Your LDL  (bad cholesterol goal is less than 100 Blood pressure goal is <140/90.  Recommend a lowfat, low carbohydrate diet divided over 5-6 small meals, increase water intake to 6-8 glasses, and 150 minutes per week of cardiovascular exercise.   Take your medications as prescribed Make sure that you are familiar with each one of your medications and what they are used to treat.  If you are unsure of medications, please bring to follow up Will send referral for eye exam  Please keep your scheduled follow up appointment.    Will start a trial of gabapentin 300 mg three times per day for neuropathy

## 2017-07-06 NOTE — Progress Notes (Signed)
Subjective:    Patient ID: Jared [name] Reason, male    DOB: 08/30/1966, 51 y.o.   MRN: [id number]  HPI [name], a 51 year old male with a history of type 2 diabetes mellitus and Crohn's diseases presents for a follow up of chronic conditions. Patient has been lost to follow up over the past 9 months.  Patient has been taking Lantus 10 units and metformin consistently. He has not been following a low carbohydrate diet or exercising routinely. Patient reports numbness and tingling to lower extremities primarily at night. He also endorses right shoulder pain. He just recently started a cleaning job. Patient denies foot ulcerations, increase appetite, nausea,  polydipsia, polyuria, visual disturbances, vomitting and weight loss. Evaluation to date has been included: fasting blood sugar and hemoglobin A1C. He states that he has not been checking blood sugars at home.     [name] has a history of Crohn's disease which has previously been managed by Dr. Amedeo Plenty at [facility]. He also reports a h/o small bowel resection due to SBO secondary to strictures associated with Crohn's disease. He has not had consistent care for Crohn's disease over the past several  years due to unemployment and loss of health insurance.  He reports that Crohn's has been stable. He denies fever, chills, abdominal pain,  nausea, vomiting, and diarrhea.   Past Medical History:  Diagnosis Date  . Crohn disease ([place]) 2008   with hospital admission in 2011, and 8/12 for flare up and SBO relieved with bowel rest. no suregery, no meds for CD  . Diabetes mellitus type II    Social History   Social History  . Marital status: Single    Spouse name: N/A  . Number of children: N/A  . Years of education: N/A   Occupational History  . Not on file.   Social History Main Topics  . Smoking status: Current Every Day Smoker    Packs/day: 0.25    Years: 30.00    Types: Cigarettes  . Smokeless tobacco: Former Systems developer    Types: [place] date: 01/20/1997  . Alcohol use No  . Drug use: No  . Sexual activity: Not Currently   Other Topics Concern  . Not on file   Social History Narrative   Lives in [place].Is single. Has a sister in [place]. He has a twin brother that passed away from autoimmune hepatitis.  Smokes 2-3 cigarettes a day. Drinks beer once every few months. No history of drug abuse. Studied up to 12th grade. Has orange card. No health insurance. Currently employed cleaning buildings.    Review of Systems  Constitutional: Negative.  Negative for fever.  HENT: Positive for dental problem.   Eyes: Negative.   Respiratory: Negative.   Cardiovascular: Negative.   Gastrointestinal: Negative.   Endocrine: Negative.   Genitourinary: Negative.   Musculoskeletal: Positive for arthralgias (right shoulder pain).  Skin: Negative.   Allergic/Immunologic: Negative.   Neurological: Positive for numbness (lower extremities).  Hematological: Negative.   Psychiatric/Behavioral: Negative.        Objective:   Physical Exam  Constitutional: He is oriented to person, place, and time. Vital signs are normal. He appears well-developed and well-nourished.  HENT:  Head: Normocephalic and atraumatic.  Right Ear: Hearing, tympanic membrane, external ear and ear canal normal.  Left Ear: Hearing, tympanic membrane, external ear and ear canal normal.  Nose: Nose normal.  Mouth/Throat: Oropharynx is clear and moist. He does not have dentures.  Abnormal dentition. Dental caries present.  Eyes: Pupils are equal, round, and reactive to light. Conjunctivae are normal.  Neck: Normal range of motion. Neck supple.  Cardiovascular: Normal rate, regular rhythm and normal heart sounds.   Pulmonary/Chest: Effort normal and breath sounds normal.  Abdominal: Soft. Bowel sounds are normal.  Musculoskeletal:       Right shoulder: He exhibits decreased range of motion and tenderness. He exhibits no swelling.  Neurological: He is  alert and oriented to person, place, and time. He has normal reflexes.  Skin: Skin is warm and dry.  Psychiatric: He has a normal mood and affect. His speech is normal and behavior is normal. Judgment and thought content normal.       BP (!) 142/58 (BP Location: Left Arm, Patient Position: Sitting, Cuff Size: Large) Comment: manually  Pulse 63   Temp 98 F (36.7 C) (Oral)   Resp 16   Ht 6' 2"  (1.88 m)   Wt 208 lb (94.3 kg)   SpO2 100%   BMI 26.71 kg/m  Assessment & Plan:  1. Type 2 diabetes mellitus with diabetic neuropathy, without long-term current use of insulin (HCC) Hemoglobin a1C has improved to 6.6.  Will continue medications at current dosage Recommend a lowfat, low carbohydrate diet divided over 5-6 small meals, increase water intake to 6-8 glasses, and 150 minutes per week of cardiovascular exercise.   - Glucose, capillary - HgB A1c - POCT urinalysis dip (device) - BASIC METABOLIC PANEL WITH GFR - Blood Glucose Monitoring Suppl (TRUE METRIX METER) w/Device KIT; 1 each by Does not apply route 4 (four) times daily -  before meals and at bedtime.  Dispense: 1 kit; Refill: 0 - glucose blood (TRUE METRIX BLOOD GLUCOSE TEST) test strip; Use as instructed  Dispense: 100 each; Refill: 12 - Microalbumin/Creatinine Ratio, Urine  2. Neuropathy - gabapentin (NEURONTIN) 300 MG capsule; Take 1 capsule (300 mg total) by mouth 3 (three) times daily.  Dispense: 90 capsule; Refill: 0  3. Dyslipidemia The ASCVD Risk score Mikey Bussing DC Jr., et al., 2013) failed to calculate for the following reasons:   The valid total cholesterol range is 130 to 320 mg/dL The patient is asked to make an attempt to improve diet and exercise patterns to aid in medical management of this problem.  - Lipid Panel  4. Tobacco dependence Discussed smoking cessation at length. Will start a trial of Chantix.  - varenicline (CHANTIX CONTINUING MONTH PAK) 1 MG tablet; Take 1 tablet (1 mg total) by mouth 2 (two)  times daily.  Dispense: 60 tablet; Refill: 1    RTC: 3 months for DMII   [name]  MSN, FNP-C [facility] [address] [place], [place] [address] [phone]

## 2017-07-07 ENCOUNTER — Other Ambulatory Visit: Payer: Self-pay

## 2017-07-07 DIAGNOSIS — E1140 Type 2 diabetes mellitus with diabetic neuropathy, unspecified: Secondary | ICD-10-CM

## 2017-07-07 LAB — BASIC METABOLIC PANEL WITH GFR
BUN: 13 mg/dL (ref 7–25)
CO2: 22 mmol/L (ref 20–32)
Calcium: 9.3 mg/dL (ref 8.6–10.3)
Chloride: 110 mmol/L (ref 98–110)
Creat: 0.98 mg/dL (ref 0.70–1.33)
GFR, Est African American: >89 mL/min (ref >=60)
GFR, Est Non African American: 89 mL/min (ref >=60)
Glucose, Bld: 95 mg/dL (ref 65–99)
Potassium: 4 mmol/L (ref 3.5–5.3)
Sodium: 143 mmol/L (ref 135–146)

## 2017-07-07 LAB — MICROALBUMIN / CREATININE URINE RATIO
Creatinine, Urine: 240 mg/dL (ref 20–370)
Microalb Creat Ratio: 9 mcg/mg creat (ref <30)
Microalb, Ur: 2.1 mg/dL

## 2017-07-07 LAB — LIPID PANEL
Cholesterol: 125 mg/dL (ref <200)
HDL: 33 mg/dL — ABNORMAL LOW (ref >40)
LDL Cholesterol: 75 mg/dL (ref <100)
Total CHOL/HDL Ratio: 3.8 Ratio (ref <5.0)
Triglycerides: 87 mg/dL (ref <150)
VLDL: 17 mg/dL (ref <30)

## 2017-07-07 MED ORDER — INSULIN GLARGINE 100 UNIT/ML ~~LOC~~ SOLN: 10.0000 [IU] | Freq: Every day | SUBCUTANEOUS | 3 refills | Status: DC

## 2017-07-07 MED ORDER — GABAPENTIN 300 MG PO CAPS: 300.0000 mg | ORAL_CAPSULE | Freq: Three times a day (TID) | ORAL | 0 refills | Status: DC

## 2017-07-07 MED FILL — $LANTUS SOLOSTAR 100 UNITS/: 100 | 30 days supply | Qty: 3 | Fill #0

## 2017-07-07 NOTE — Telephone Encounter (Signed)
lantus sent into correct pharmacy. Thanks!

## 2017-07-10 ENCOUNTER — Encounter: Payer: Self-pay | Admitting: Family Medicine

## 2017-07-10 MED ORDER — IBUPROFEN 800 MG PO TABS: 800.0000 mg | ORAL_TABLET | Freq: Three times a day (TID) | ORAL | 2 refills | Status: DC | PRN

## 2017-07-11 MED FILL — IBUPROFEN 800 MG TABLET: 800 | 10 days supply | Qty: 30 | Fill #0

## 2017-07-12 ENCOUNTER — Other Ambulatory Visit: Payer: Self-pay | Admitting: Family Medicine

## 2017-07-12 NOTE — Progress Notes (Signed)
Returned call to patient regarding medication reaction with gabapentin causing him to have suicidal thoughts. Left a voicemail for him to discontinue taking Gabapentin and for him to call the office tomorrow for change in regimen and or he may be required to come in to office to obtain a prescription for treatment.   Jared [name]. Kenton Kingfisher, MSN, FNP-C [facility]  [address] [place],  [address] [phone]

## 2017-07-25 ENCOUNTER — Other Ambulatory Visit: Payer: Self-pay | Admitting: Family Medicine

## 2017-07-25 DIAGNOSIS — E1140 Type 2 diabetes mellitus with diabetic neuropathy, unspecified: Secondary | ICD-10-CM

## 2017-07-25 MED FILL — ?METFORMIN HCL 500MG TABLET: 500 | 30 days supply | Qty: 60 | Fill #0

## 2017-07-25 MED FILL — IBUPROFEN 800 MG TABLET: 800 | 10 days supply | Qty: 30 | Fill #1

## 2017-07-27 ENCOUNTER — Emergency Department (HOSPITAL_COMMUNITY): Payer: No Typology Code available for payment source

## 2017-07-27 ENCOUNTER — Emergency Department (HOSPITAL_COMMUNITY): Admission: EM | Admit: 2017-07-27 | Payer: Self-pay | Source: Home / Self Care

## 2017-07-27 ENCOUNTER — Inpatient Hospital Stay (HOSPITAL_COMMUNITY)
Admission: EM | Admit: 2017-07-27 | Discharge: 2017-07-31 | DRG: 552 | Disposition: A | Discharge status: Home / Self Care | Payer: No Typology Code available for payment source | Attending: Neurosurgery | Admitting: Neurosurgery

## 2017-07-27 ENCOUNTER — Encounter (HOSPITAL_COMMUNITY): Payer: Self-pay | Admitting: *Deleted

## 2017-07-27 DIAGNOSIS — Z8249 Family history of ischemic heart disease and other diseases of the circulatory system: Secondary | ICD-10-CM

## 2017-07-27 DIAGNOSIS — F17210 Nicotine dependence, cigarettes, uncomplicated: Secondary | ICD-10-CM | POA: Diagnosis present

## 2017-07-27 DIAGNOSIS — Y92410 Unspecified street and highway as the place of occurrence of the external cause: Secondary | ICD-10-CM

## 2017-07-27 DIAGNOSIS — S15191A Other specified injury of right vertebral artery, initial encounter: Secondary | ICD-10-CM | POA: Diagnosis present

## 2017-07-27 DIAGNOSIS — S22018A Other fracture of first thoracic vertebra, initial encounter for closed fracture: Secondary | ICD-10-CM

## 2017-07-27 DIAGNOSIS — S12000A Unspecified displaced fracture of first cervical vertebra, initial encounter for closed fracture: Secondary | ICD-10-CM

## 2017-07-27 DIAGNOSIS — S12030A Displaced posterior arch fracture of first cervical vertebra, initial encounter for closed fracture: Principal | ICD-10-CM | POA: Diagnosis present

## 2017-07-27 DIAGNOSIS — S15192A Other specified injury of left vertebral artery, initial encounter: Secondary | ICD-10-CM | POA: Diagnosis present

## 2017-07-27 DIAGNOSIS — S12190A Other displaced fracture of second cervical vertebra, initial encounter for closed fracture: Secondary | ICD-10-CM | POA: Diagnosis present

## 2017-07-27 DIAGNOSIS — Z833 Family history of diabetes mellitus: Secondary | ICD-10-CM

## 2017-07-27 DIAGNOSIS — S129XXA Fracture of neck, unspecified, initial encounter: Secondary | ICD-10-CM | POA: Diagnosis present

## 2017-07-27 DIAGNOSIS — Z8349 Family history of other endocrine, nutritional and metabolic diseases: Secondary | ICD-10-CM

## 2017-07-27 DIAGNOSIS — E119 Type 2 diabetes mellitus without complications: Secondary | ICD-10-CM | POA: Diagnosis present

## 2017-07-27 DIAGNOSIS — S12290A Other displaced fracture of third cervical vertebra, initial encounter for closed fracture: Secondary | ICD-10-CM | POA: Diagnosis present

## 2017-07-27 DIAGNOSIS — S12100A Unspecified displaced fracture of second cervical vertebra, initial encounter for closed fracture: Secondary | ICD-10-CM | POA: Diagnosis present

## 2017-07-27 DIAGNOSIS — Z9049 Acquired absence of other specified parts of digestive tract: Secondary | ICD-10-CM

## 2017-07-27 MED ORDER — HYDROMORPHONE HCL 1 MG/ML IJ SOLN
1.0000 mg | Freq: Once | INTRAMUSCULAR | Status: AC
  Administered 2017-07-27: 1 mg via INTRAVENOUS
  Filled 2017-07-27: qty 1

## 2017-07-27 MED ORDER — IOPAMIDOL (ISOVUE-370) INJECTION 76%
INTRAVENOUS | Status: AC
  Filled 2017-07-27: qty 100

## 2017-07-27 MED ORDER — MORPHINE SULFATE (PF) 4 MG/ML IV SOLN
4.0000 mg | INTRAVENOUS | Status: AC | PRN
  Administered 2017-07-27 – 2017-07-28 (×2): 4 mg via INTRAVENOUS
  Filled 2017-07-27 (×2): qty 1

## 2017-07-27 MED ORDER — SODIUM CHLORIDE 0.9 % IV SOLN
INTRAVENOUS | Status: DC
  Administered 2017-07-27: 22:00:00 via INTRAVENOUS

## 2017-07-27 MED ORDER — LORAZEPAM 2 MG/ML IJ SOLN
1.0000 mg | Freq: Once | INTRAMUSCULAR | Status: AC
  Administered 2017-07-27: 1 mg via INTRAVENOUS
  Filled 2017-07-27: qty 1

## 2017-07-27 MED ORDER — SODIUM CHLORIDE 0.9 % IV BOLUS (SEPSIS)
1000.0000 mL | Freq: Once | INTRAVENOUS | Status: AC
  Administered 2017-07-27: 1000 mL via INTRAVENOUS

## 2017-07-27 MED ORDER — ONDANSETRON HCL 4 MG/2ML IJ SOLN
4.0000 mg | Freq: Once | INTRAMUSCULAR | Status: DC | PRN
Start: 2017-07-27 — End: 2017-07-31
  Filled 2017-07-27: qty 2
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
## 2017-07-27 NOTE — ED Notes (Signed)
IV fluids were hung and meds given, await arm band before I can chart

## 2017-07-27 NOTE — ED Notes (Signed)
PT attempting to take c-collar off d/t increasing pain.  Dr Wilson Singer notified. EMS collar removed, aspen collar placed with Dr Eugenio Hoes assistance.  Dilaudid given.
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
## 2017-07-28 ENCOUNTER — Encounter (HOSPITAL_COMMUNITY): Payer: Self-pay | Admitting: *Deleted

## 2017-07-28 ENCOUNTER — Emergency Department (HOSPITAL_COMMUNITY): Payer: No Typology Code available for payment source

## 2017-07-28 DIAGNOSIS — Z833 Family history of diabetes mellitus: Secondary | ICD-10-CM | POA: Diagnosis not present

## 2017-07-28 DIAGNOSIS — Z8249 Family history of ischemic heart disease and other diseases of the circulatory system: Secondary | ICD-10-CM | POA: Diagnosis not present

## 2017-07-28 DIAGNOSIS — S129XXA Fracture of neck, unspecified, initial encounter: Secondary | ICD-10-CM | POA: Diagnosis present

## 2017-07-28 DIAGNOSIS — Z9049 Acquired absence of other specified parts of digestive tract: Secondary | ICD-10-CM | POA: Diagnosis not present

## 2017-07-28 DIAGNOSIS — E119 Type 2 diabetes mellitus without complications: Secondary | ICD-10-CM | POA: Diagnosis present

## 2017-07-28 DIAGNOSIS — S12100A Unspecified displaced fracture of second cervical vertebra, initial encounter for closed fracture: Secondary | ICD-10-CM | POA: Diagnosis present

## 2017-07-28 DIAGNOSIS — F17210 Nicotine dependence, cigarettes, uncomplicated: Secondary | ICD-10-CM | POA: Diagnosis present

## 2017-07-28 DIAGNOSIS — S15192A Other specified injury of left vertebral artery, initial encounter: Secondary | ICD-10-CM | POA: Diagnosis present

## 2017-07-28 DIAGNOSIS — Y92410 Unspecified street and highway as the place of occurrence of the external cause: Secondary | ICD-10-CM | POA: Diagnosis not present

## 2017-07-28 DIAGNOSIS — S15191A Other specified injury of right vertebral artery, initial encounter: Secondary | ICD-10-CM | POA: Diagnosis present

## 2017-07-28 DIAGNOSIS — S12030A Displaced posterior arch fracture of first cervical vertebra, initial encounter for closed fracture: Secondary | ICD-10-CM | POA: Diagnosis present

## 2017-07-28 DIAGNOSIS — S12290A Other displaced fracture of third cervical vertebra, initial encounter for closed fracture: Secondary | ICD-10-CM | POA: Diagnosis present

## 2017-07-28 DIAGNOSIS — Z8349 Family history of other endocrine, nutritional and metabolic diseases: Secondary | ICD-10-CM | POA: Diagnosis not present

## 2017-07-28 DIAGNOSIS — S12190A Other displaced fracture of second cervical vertebra, initial encounter for closed fracture: Secondary | ICD-10-CM | POA: Diagnosis present

## 2017-07-28 LAB — CBC WITH DIFFERENTIAL/PLATELET
Basophils Absolute: 0 10*3/uL (ref 0.0–0.1)
Basophils Relative: 0 %
Eosinophils Absolute: 0 10*3/uL (ref 0.0–0.7)
Eosinophils Relative: 0 %
HCT: 38.8 % — ABNORMAL LOW (ref 39.0–52.0)
Hemoglobin: 12.6 g/dL — ABNORMAL LOW (ref 13.0–17.0)
Lymphocytes Relative: 11 %
Lymphs Abs: 1.2 10*3/uL (ref 0.7–4.0)
MCH: 28.4 pg (ref 26.0–34.0)
MCHC: 32.5 g/dL (ref 30.0–36.0)
MCV: 87.6 fL (ref 78.0–100.0)
Monocytes Absolute: 0.9 10*3/uL (ref 0.1–1.0)
Monocytes Relative: 8 %
Neutro Abs: 9.2 10*3/uL — ABNORMAL HIGH (ref 1.7–7.7)
Neutrophils Relative %: 81 %
Platelets: 237 10*3/uL (ref 150–400)
RBC: 4.43 MIL/uL (ref 4.22–5.81)
RDW: 15.1 % (ref 11.5–15.5)
WBC: 11.3 10*3/uL — ABNORMAL HIGH (ref 4.0–10.5)

## 2017-07-28 LAB — CBG MONITORING, ED
Glucose-Capillary: 117 mg/dL — ABNORMAL HIGH (ref 65–99)
Glucose-Capillary: 117 mg/dL — ABNORMAL HIGH (ref 65–99)
Glucose-Capillary: 121 mg/dL — ABNORMAL HIGH (ref 65–99)
Glucose-Capillary: 96 mg/dL (ref 65–99)
Glucose-Capillary: 109 mg/dL — ABNORMAL HIGH (ref 65–99)

## 2017-07-28 LAB — BASIC METABOLIC PANEL
Anion gap: 7 (ref 5–15)
BUN: 7 mg/dL (ref 6–20)
CO2: 25 mmol/L (ref 22–32)
Calcium: 8.5 mg/dL — ABNORMAL LOW (ref 8.9–10.3)
Chloride: 107 mmol/L (ref 101–111)
Creatinine, Ser: 0.91 mg/dL (ref 0.61–1.24)
GFR calc Af Amer: >60 mL/min (ref >60)
GFR calc non Af Amer: >60 mL/min (ref >60)
Glucose, Bld: 131 mg/dL — ABNORMAL HIGH (ref 65–99)
Potassium: 3.7 mmol/L (ref 3.5–5.1)
Sodium: 139 mmol/L (ref 135–145)

## 2017-07-28 LAB — HEMOGLOBIN A1C
Hgb A1c MFr Bld: 6.8 % — ABNORMAL HIGH (ref 4.8–5.6)
Mean Plasma Glucose: 148.46 mg/dL

## 2017-07-28 LAB — GLUCOSE, CAPILLARY: Glucose-Capillary: 120 mg/dL — ABNORMAL HIGH (ref 65–99)

## 2017-07-28 MED ORDER — MORPHINE SULFATE (PF) 4 MG/ML IV SOLN
2.0000 mg | INTRAVENOUS | Status: DC | PRN
  Administered 2017-07-28 – 2017-07-30 (×9): 4 mg via INTRAVENOUS
  Administered 2017-07-30: 2 mg via INTRAVENOUS
  Filled 2017-07-28 (×10): qty 1

## 2017-07-28 MED ORDER — ASPIRIN 81 MG PO CHEW
324.0000 mg | CHEWABLE_TABLET | Freq: Once | ORAL | Status: AC
  Administered 2017-07-28: 324 mg via ORAL
  Filled 2017-07-28: qty 4

## 2017-07-28 MED ORDER — ACETAMINOPHEN 650 MG RE SUPP: 650.0000 mg | Freq: Four times a day (QID) | RECTAL | Status: DC | PRN

## 2017-07-28 MED ORDER — IOPAMIDOL (ISOVUE-370) INJECTION 76%
INTRAVENOUS | Status: AC
  Administered 2017-07-28: 50 mL
  Filled 2017-07-28: qty 50

## 2017-07-28 MED ORDER — ACETAMINOPHEN 325 MG PO TABS
650.0000 mg | ORAL_TABLET | Freq: Four times a day (QID) | ORAL | Status: DC | PRN
  Administered 2017-07-30 – 2017-07-31 (×2): 650 mg via ORAL
  Filled 2017-07-28 (×2): qty 2

## 2017-07-28 MED ORDER — ASPIRIN 325 MG PO TABS
325.0000 mg | ORAL_TABLET | Freq: Every day | ORAL | Status: DC
  Administered 2017-07-28 – 2017-07-31 (×3): 325 mg via ORAL
  Filled 2017-07-28 (×4): qty 1

## 2017-07-28 MED ORDER — OXYCODONE HCL 5 MG PO TABS
5.0000 mg | ORAL_TABLET | ORAL | Status: DC | PRN
  Administered 2017-07-28 – 2017-07-31 (×10): 5 mg via ORAL
  Filled 2017-07-28 (×11): qty 1

## 2017-07-28 MED ORDER — DOCUSATE SODIUM 100 MG PO CAPS
100.0000 mg | ORAL_CAPSULE | Freq: Two times a day (BID) | ORAL | Status: DC
Start: 2017-07-28 — End: 2017-07-31
  Administered 2017-07-28 – 2017-07-31 (×6): 100 mg via ORAL
  Filled 2017-07-28 (×7): qty 1

## 2017-07-28 MED ORDER — POTASSIUM CHLORIDE IN NACL 20-0.9 MEQ/L-% IV SOLN
INTRAVENOUS | Status: DC
  Administered 2017-07-28 – 2017-07-29 (×2): via INTRAVENOUS
  Filled 2017-07-28 (×3): qty 1000

## 2017-07-28 MED ORDER — ONDANSETRON HCL 4 MG PO TABS: 4.0000 mg | ORAL_TABLET | Freq: Four times a day (QID) | ORAL | Status: DC | PRN

## 2017-07-28 MED ORDER — ONDANSETRON HCL 4 MG/2ML IJ SOLN
4.0000 mg | Freq: Four times a day (QID) | INTRAMUSCULAR | Status: DC | PRN
Start: 2017-07-28 — End: 2017-07-31

## 2017-07-28 MED ORDER — INSULIN ASPART 100 UNIT/ML ~~LOC~~ SOLN
0.0000 [IU] | SUBCUTANEOUS | Status: DC
  Administered 2017-07-28 – 2017-07-30 (×3): 3 [IU] via SUBCUTANEOUS
  Filled 2017-07-28: qty 1

## 2017-07-28 NOTE — ED Notes (Signed)
Meal Tray at the bedside

## 2017-07-28 NOTE — ED Notes (Signed)
Pt given ginger ale per Hannah(RN)

## 2017-07-28 NOTE — ED Notes (Signed)
Pt given water per Hannah(RN)

## 2017-07-28 NOTE — ED Notes (Signed)
Pt CBG was 96, notified Hannah(RN)

## 2017-07-28 NOTE — ED Notes (Signed)
Admitting MD at the bedside.  

## 2017-07-28 NOTE — ED Provider Notes (Signed)
Discussed findings with dr Earle Gell He recommends starting ASA 310m [facility] He is awake/alert No arm/leg drift He has equal strength throughout extremities No abdominal tenderness He was updated on plan    WRipley Fraise MD 07/28/17 [id number]

## 2017-07-28 NOTE — ED Notes (Signed)
Pt placed in hospital bed for comfort.

## 2017-07-28 NOTE — ED Notes (Signed)
Attempted Report x1.   
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
## 2017-07-28 NOTE — ED Notes (Signed)
Meal Tray delivered

## 2017-07-28 NOTE — ED Notes (Signed)
Pt on stretcher with his head on a pillow in his c-collar  Asking for something to drink

## 2017-07-28 NOTE — ED Notes (Signed)
CBG= 121

## 2017-07-28 NOTE — ED Notes (Signed)
Pt CBG was 109, notified Hannah(RN)

## 2017-07-29 LAB — GLUCOSE, CAPILLARY
Glucose-Capillary: 109 mg/dL — ABNORMAL HIGH (ref 65–99)
Glucose-Capillary: 110 mg/dL — ABNORMAL HIGH (ref 65–99)
Glucose-Capillary: 117 mg/dL — ABNORMAL HIGH (ref 65–99)
Glucose-Capillary: 123 mg/dL — ABNORMAL HIGH (ref 65–99)
Glucose-Capillary: 125 mg/dL — ABNORMAL HIGH (ref 65–99)
Glucose-Capillary: 126 mg/dL — ABNORMAL HIGH (ref 65–99)

## 2017-07-29 LAB — HIV ANTIBODY (ROUTINE TESTING W REFLEX): HIV Screen 4th Generation wRfx: NONREACTIVE
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
## 2017-07-29 NOTE — Progress Notes (Signed)
Ambulated in the room.

## 2017-07-29 NOTE — Progress Notes (Signed)
Patient arrived to floor from [facility]. Patient alert and oriented x4. Has aspen collar on and complaining of neck pain rated 9/10. Patient's vitals taken and stable.  Patient oriented to room, call bell and phone. PRN pain medication given to treat patient's pain. RN will continue to monitor.

## 2017-07-29 NOTE — Care Management Note (Signed)
Case Management Note  Patient Details  Name: Jared [name] MRN: [id number] Date of Birth: [birth year]  Subjective/Objective:   Pt in with cervical spine fracture. He is from home alone.  Pt is without insurance.                 Action/Plan: Pt is active with [facility] pharmacy and [facility].  No f/u per PT and awaiting OT recommendations. CM following for d/c needs, physician orders.   Expected Discharge Date:                  Expected Discharge Plan:     In-House Referral:     Discharge planning Services     Post Acute Care Choice:    Choice offered to:     DME Arranged:    DME Agency:     HH Arranged:    HH Agency:     Status of Service:  In process, will continue to follow  If discussed at Long Length of Stay Meetings, dates discussed:    Additional Comments:  Pollie Friar, RN 07/29/2017, 4:04 PM

## 2017-07-29 NOTE — Progress Notes (Signed)
Overall doing well. Patient complains of neck pain. Denies any radiating pain. No symptoms of numbness or tingling. No symptoms of cranial neuropathy.  Patient is awake and alert. He is oriented and appropriate. Speech is fluent. His judgment and insight are intact. Cranial nerve function normal bilaterally. He is immobilized in a cervical collar. Motor and sensory function of his extremities are normal.  C1-C2 and C3 fractures. Plan for mobilization in Aspen collar. Mobilize today. Check upright x-ray tomorrow.

## 2017-07-30 ENCOUNTER — Inpatient Hospital Stay (HOSPITAL_COMMUNITY): Payer: No Typology Code available for payment source

## 2017-07-30 LAB — GLUCOSE, CAPILLARY
Glucose-Capillary: 112 mg/dL — ABNORMAL HIGH (ref 65–99)
Glucose-Capillary: 113 mg/dL — ABNORMAL HIGH (ref 65–99)
Glucose-Capillary: 138 mg/dL — ABNORMAL HIGH (ref 65–99)
Glucose-Capillary: 91 mg/dL (ref 65–99)
Glucose-Capillary: 91 mg/dL (ref 65–99)

## 2017-07-30 NOTE — Progress Notes (Signed)
Overall stable. Patient's neck pain fairly well controlled. No radicular symptoms. Still with some numbness extending into his occipital region bilaterally. No complaints of motor or sensory difficulties in his extremities. Mobilizing marginally for discharge home. Patient lives alone with minimal social support.  Awake and alert. Oriented and appropriate. Motor and sensory function intact.  Follow-up lateral C-spine x-ray demonstrates stable appearance of his lateral cervical spine. There is still significant bony and ligamentous injury at C2-3 this appears to be stable within his collar. C1 and C3 fractures are stable.  Continue observation and slow mobilization. Continue collar. Consider discharge tomorrow.

## 2017-07-30 NOTE — Progress Notes (Signed)
Received patient sitting up in chair. Requesting pain medication, as he states his pain is a 10/10. Morphine given. Patient alert and oriented and ambulatory without assist. Is wearing Aspen collar. Eating with good appetite, drinking, adequately. Day RN stopped IV fluids. Patient going home in am. I continued to leave patient saline locked. Patient also states that he does not want to have CBGs done every 4 hours, as he is eating well. Informed  NT.Talked with patient regarding educational materials. Information printed and given to patient.Name and number on white board.  Safety maintained.

## 2017-07-31 LAB — GLUCOSE, CAPILLARY
Glucose-Capillary: 102 mg/dL — ABNORMAL HIGH (ref 65–99)
Glucose-Capillary: 132 mg/dL — ABNORMAL HIGH (ref 65–99)

## 2017-07-31 MED ORDER — OXYCODONE-ACETAMINOPHEN 5-325 MG PO TABS: 1.0000 | ORAL_TABLET | ORAL | 0 refills | Status: DC | PRN

## 2017-07-31 MED ORDER — METHOCARBAMOL 500 MG PO TABS: 500.0000 mg | ORAL_TABLET | Freq: Four times a day (QID) | ORAL | 0 refills | Status: DC
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
## 2017-07-31 NOTE — Progress Notes (Signed)
Discharge instructions reviewed with patient/family. All questions answered at this time. RX given. Transport home by family.   Ave Filter, RN

## 2017-08-01 MED FILL — METHOCARBAMOL 500 MG TABS: 500 | 12 days supply | Qty: 50 | Fill #0

## 2017-08-15 MED FILL — IBUPROFEN 800 MG TABLET: 800 | 10 days supply | Qty: 30 | Fill #2

## 2017-08-25 ENCOUNTER — Other Ambulatory Visit: Payer: Self-pay | Admitting: Family Medicine

## 2017-08-25 DIAGNOSIS — M25511 Pain in right shoulder: Secondary | ICD-10-CM

## 2017-08-26 MED FILL — IBUPROFEN 800 MG TABLET: 800 | 10 days supply | Qty: 30 | Fill #0

## 2017-08-30 DIAGNOSIS — S12031D Nondisplaced posterior arch fracture of first cervical vertebra, subsequent encounter for fracture with routine healing: Secondary | ICD-10-CM | POA: Insufficient documentation

## 2017-08-30 DIAGNOSIS — S15109A Unspecified injury of unspecified vertebral artery, initial encounter: Secondary | ICD-10-CM | POA: Insufficient documentation

## 2017-09-14 ENCOUNTER — Other Ambulatory Visit: Payer: Self-pay | Admitting: Family Medicine

## 2017-09-14 DIAGNOSIS — E1140 Type 2 diabetes mellitus with diabetic neuropathy, unspecified: Secondary | ICD-10-CM

## 2017-09-16 MED FILL — metFORMIN HCL 500 MG TABS: 500 | 30 days supply | Qty: 60 | Fill #0

## 2017-09-19 MED FILL — IBUPROFEN 800 MG TABLET: 800 | 10 days supply | Qty: 30 | Fill #1

## 2017-10-10 MED FILL — IBUPROFEN 800 MG TABLET: 800 | 10 days supply | Qty: 30 | Fill #2

## 2017-10-13 ENCOUNTER — Ambulatory Visit: Payer: Self-pay | Admitting: Family Medicine

## 2017-10-26 ENCOUNTER — Other Ambulatory Visit: Payer: Self-pay | Admitting: Family Medicine

## 2017-10-26 DIAGNOSIS — E1140 Type 2 diabetes mellitus with diabetic neuropathy, unspecified: Secondary | ICD-10-CM

## 2017-10-26 DIAGNOSIS — M25511 Pain in right shoulder: Secondary | ICD-10-CM

## 2017-10-26 MED FILL — ?METFORMIN HCL 500MG TABLET: 500 | 30 days supply | Qty: 60 | Fill #0

## 2017-10-26 MED FILL — IBUPROFEN 800 MG TABLET: 800 | 10 days supply | Qty: 30 | Fill #0

## 2017-11-16 MED FILL — IBUPROFEN 800 MG TABLET: 800 | 10 days supply | Qty: 30 | Fill #1

## 2017-12-02 ENCOUNTER — Other Ambulatory Visit: Payer: Self-pay | Admitting: Family Medicine

## 2017-12-02 DIAGNOSIS — E1140 Type 2 diabetes mellitus with diabetic neuropathy, unspecified: Secondary | ICD-10-CM

## 2017-12-02 MED FILL — ?METFORMIN HCL 500MG TABLET: 500 | 30 days supply | Qty: 60 | Fill #0

## 2017-12-02 MED FILL — IBUPROFEN 800 MG TABLET: 800 | 10 days supply | Qty: 30 | Fill #2

## 2017-12-14 ENCOUNTER — Ambulatory Visit: Payer: Self-pay | Admitting: Family Medicine

## 2017-12-15 ENCOUNTER — Ambulatory Visit (INDEPENDENT_AMBULATORY_CARE_PROVIDER_SITE_OTHER): Payer: Self-pay | Admitting: Family Medicine

## 2017-12-15 ENCOUNTER — Encounter: Payer: Self-pay | Admitting: Family Medicine

## 2017-12-15 VITALS — BP 136/68 | HR 80 | Temp 98.4°F | Resp 14 | Ht 74.0 in | Wt 216.0 lb

## 2017-12-15 DIAGNOSIS — G8929 Other chronic pain: Secondary | ICD-10-CM

## 2017-12-15 DIAGNOSIS — G629 Polyneuropathy, unspecified: Secondary | ICD-10-CM

## 2017-12-15 DIAGNOSIS — S129XXS Fracture of neck, unspecified, sequela: Secondary | ICD-10-CM

## 2017-12-15 DIAGNOSIS — E1140 Type 2 diabetes mellitus with diabetic neuropathy, unspecified: Secondary | ICD-10-CM

## 2017-12-15 DIAGNOSIS — F17200 Nicotine dependence, unspecified, uncomplicated: Secondary | ICD-10-CM

## 2017-12-15 DIAGNOSIS — M25511 Pain in right shoulder: Secondary | ICD-10-CM

## 2017-12-15 DIAGNOSIS — F329 Major depressive disorder, single episode, unspecified: Secondary | ICD-10-CM

## 2017-12-15 DIAGNOSIS — F32A Depression, unspecified: Secondary | ICD-10-CM

## 2017-12-15 DIAGNOSIS — E785 Hyperlipidemia, unspecified: Secondary | ICD-10-CM

## 2017-12-15 LAB — POCT URINALYSIS DIP (DEVICE)
Bilirubin Urine: NEGATIVE
Glucose, UA: 500 mg/dL — AB
Hgb urine dipstick: NEGATIVE
Ketones, ur: NEGATIVE mg/dL
Leukocytes, UA: NEGATIVE
Nitrite: NEGATIVE
Protein, ur: NEGATIVE mg/dL
Specific Gravity, Urine: 1.015 (ref 1.005–1.030)
Urobilinogen, UA: 0.2 mg/dL (ref 0.0–1.0)
pH: 5.5 (ref 5.0–8.0)

## 2017-12-15 LAB — POCT GLYCOSYLATED HEMOGLOBIN (HGB A1C): Hemoglobin A1C: 9.3

## 2017-12-15 MED ORDER — METFORMIN HCL 1000 MG PO TABS: 1000.0000 mg | ORAL_TABLET | Freq: Two times a day (BID) | ORAL | 5 refills | Status: DC

## 2017-12-15 MED ORDER — SITAGLIPTIN PHOSPHATE 50 MG PO TABS: 50.0000 mg | ORAL_TABLET | Freq: Every day | ORAL | 5 refills | Status: DC

## 2017-12-15 MED ORDER — DULOXETINE HCL 20 MG PO CPEP: 20.0000 mg | ORAL_CAPSULE | Freq: Two times a day (BID) | ORAL | 5 refills | Status: DC

## 2017-12-15 MED FILL — DULoxetine HCL 20 MG CPEP: 20 | 30 days supply | Qty: 60 | Fill #0

## 2017-12-15 MED FILL — ?METFORMIN HCL 1,000 MG TAB: 1000 | 30 days supply | Qty: 60 | Fill #0

## 2017-12-15 MED FILL — JANUVIA 50 MG TABLET: 50 | 30 days supply | Qty: 30 | Fill #0

## 2017-12-15 NOTE — Patient Instructions (Addendum)
Hemolglobin a1c is 9.3, which is above goal. Goal is < 7.  Your A1C goal is less than 7. Your fasting blood sugar  Upon awakening goal is between 110-140.  Blood pressure goal is <140/90.  Recommend a lowfat, low carbohydrate diet divided over 5-6 small meals, increase water intake to 6-8 glasses, and 150 minutes per week of cardiovascular exercise.   Take your medications as prescribed Make sure that you are familiar with each one of your medications and what they are used to treat.  If you are unsure of medications, please bring to follow up Will send referral for eye exam  Please keep your scheduled follow up appointment.   Will start a trial of cymbalta 20 mg BID. Will follow up in 1 month for neuropathy.   Follow up with neurosurgery as scheduled  Recommend [place] walk in clinic from 8 am to 3 pm [place], [place]
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
## 2017-12-16 LAB — COMPREHENSIVE METABOLIC PANEL
ALT: 25 IU/L (ref 0–44)
AST: 18 IU/L (ref 0–40)
Albumin/Globulin Ratio: 1.6 (ref 1.2–2.2)
Albumin: 4.2 g/dL (ref 3.5–5.5)
Alkaline Phosphatase: 90 IU/L (ref 39–117)
BUN/Creatinine Ratio: 7 — ABNORMAL LOW (ref 9–20)
BUN: 6 mg/dL (ref 6–24)
Bilirubin Total: 0.3 mg/dL (ref 0.0–1.2)
CO2: 23 mmol/L (ref 20–29)
Calcium: 9.3 mg/dL (ref 8.7–10.2)
Chloride: 104 mmol/L (ref 96–106)
Creatinine, Ser: 0.9 mg/dL (ref 0.76–1.27)
GFR calc Af Amer: 114 mL/min/{1.73_m2} (ref >59)
GFR calc non Af Amer: 99 mL/min/{1.73_m2} (ref >59)
Globulin, Total: 2.6 g/dL (ref 1.5–4.5)
Glucose: 262 mg/dL — ABNORMAL HIGH (ref 65–99)
Potassium: 4 mmol/L (ref 3.5–5.2)
Sodium: 142 mmol/L (ref 134–144)
Total Protein: 6.8 g/dL (ref 6.0–8.5)

## 2017-12-26 ENCOUNTER — Other Ambulatory Visit: Payer: Self-pay | Admitting: Family Medicine

## 2017-12-26 DIAGNOSIS — M25511 Pain in right shoulder: Secondary | ICD-10-CM

## 2017-12-26 MED FILL — IBUPROFEN 800 MG TABLET: 800 | 10 days supply | Qty: 30 | Fill #0

## 2018-01-13 ENCOUNTER — Ambulatory Visit: Payer: Self-pay | Admitting: Family Medicine

## 2018-01-13 ENCOUNTER — Other Ambulatory Visit: Payer: Self-pay

## 2018-01-13 ENCOUNTER — Encounter: Payer: Self-pay | Admitting: Family Medicine

## 2018-01-13 ENCOUNTER — Ambulatory Visit (INDEPENDENT_AMBULATORY_CARE_PROVIDER_SITE_OTHER): Payer: Self-pay | Admitting: Family Medicine

## 2018-01-13 ENCOUNTER — Ambulatory Visit (HOSPITAL_COMMUNITY)
Admission: RE | Admit: 2018-01-13 | Discharge: 2018-01-13 | Disposition: A | Discharge status: Home / Self Care | Payer: Self-pay | Source: Ambulatory Visit | Attending: Family Medicine | Admitting: Family Medicine

## 2018-01-13 VITALS — BP 140/90 | HR 97 | Temp 98.0°F | Ht 74.0 in | Wt 212.0 lb

## 2018-01-13 DIAGNOSIS — M25511 Pain in right shoulder: Secondary | ICD-10-CM

## 2018-01-13 DIAGNOSIS — M899 Disorder of bone, unspecified: Secondary | ICD-10-CM | POA: Insufficient documentation

## 2018-01-13 DIAGNOSIS — F17200 Nicotine dependence, unspecified, uncomplicated: Secondary | ICD-10-CM

## 2018-01-13 MED ORDER — PREDNISONE 20 MG PO TABS: 20.0000 mg | ORAL_TABLET | Freq: Every day | ORAL | 0 refills | Status: DC

## 2018-01-13 NOTE — Patient Instructions (Signed)
Will discontinue cymbalta daily due to somnelence  Will start a trial of prednisone 20 mg daily for 5 days.  Will follow up by phone with xray results.    Shoulder Pain Many things can cause shoulder pain, including:  An injury.  Moving the arm in the same way again and again (overuse).  Joint pain (arthritis).  Follow these instructions at home: Take these actions to help with your pain:  Squeeze a soft ball or a foam pad as much as you can. This helps to prevent swelling. It also makes the arm stronger.  Take over-the-counter and prescription medicines only as told by your doctor.  If told, put ice on the area: ? Put ice in a plastic bag. ? Place a towel between your skin and the bag. ? Leave the ice on for 20 minutes, 2-3 times per day. Stop putting on ice if it does not help with the pain.  If you were given a shoulder sling or immobilizer: ? Wear it as told. ? Remove it to shower or bathe. ? Move your arm as little as possible. ? Keep your hand moving. This helps prevent swelling.  Contact a doctor if:  Your pain gets worse.  Medicine does not help your pain.  You have new pain in your arm, hand, or fingers. Get help right away if:  Your arm, hand, or fingers: ? Tingle. ? Are numb. ? Are swollen. ? Are painful. ? Turn white or blue. This information is not intended to replace advice given to you by your health care provider. Make sure you discuss any questions you have with your health care provider. Document Released: 04/19/2008 Document Revised: 06/27/2016 Document Reviewed: 02/24/2015 Elsevier Interactive Patient Education  [employer].
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
## 2018-01-17 ENCOUNTER — Telehealth: Payer: Self-pay

## 2018-01-17 NOTE — Telephone Encounter (Signed)
Called, no answer and no voicemail picked up. Will try later. Thanks!

## 2018-01-17 NOTE — Telephone Encounter (Signed)
-----   Message from Dorena Dew, [place] sent at 01/17/2018  6:43 AM EST ----- Regarding: lab results Please inform patient that shoulder xray showed degenerative changes related to old injury. No acute fractures or dislocation seen. Continue medication regimen as previously prescribed.  Inquire whether patient has appointment scheduled with orthopedic specialist.   Thanks

## 2018-01-18 NOTE — Telephone Encounter (Signed)
Called and spoke with patient, advised that xray shows no fractures or dislocations but does show degenerative changes from an old injury. Patient was asked to follow up with orthopedic specialist and take all medication as previously prescribed. Patient verbalized understanding. Thanks!

## 2018-01-19 ENCOUNTER — Other Ambulatory Visit: Payer: Self-pay | Admitting: Family Medicine

## 2018-01-19 DIAGNOSIS — M25511 Pain in right shoulder: Principal | ICD-10-CM

## 2018-01-19 DIAGNOSIS — G8929 Other chronic pain: Secondary | ICD-10-CM

## 2018-01-23 ENCOUNTER — Ambulatory Visit: Payer: Self-pay | Attending: Family Medicine

## 2018-01-24 MED FILL — IBUPROFEN 800 MG TABLET: 800 | 10 days supply | Qty: 30 | Fill #1

## 2018-01-24 MED FILL — predniSONE 20 MG TABS: 20 | 5 days supply | Qty: 5 | Fill #0

## 2018-02-09 MED FILL — IBUPROFEN 800 MG TABLET: 800 | 10 days supply | Qty: 30 | Fill #2

## 2018-02-09 MED FILL — !JANUVIA 50 MG TABLET: 50 | 30 days supply | Qty: 30 | Fill #1

## 2018-02-09 MED FILL — ?METFORMIN HCL 1,000 MG TAB: 1000 | 30 days supply | Qty: 60 | Fill #1

## 2018-02-10 ENCOUNTER — Telehealth: Payer: Self-pay

## 2018-02-10 NOTE — Telephone Encounter (Signed)
Called and spoke with patient he is looking for a Psychologist, sport and exercise that did surgery for him in 2013.

## 2018-03-06 ENCOUNTER — Other Ambulatory Visit: Payer: Self-pay | Admitting: Family Medicine

## 2018-03-06 DIAGNOSIS — M25511 Pain in right shoulder: Secondary | ICD-10-CM

## 2018-03-06 MED FILL — IBUPROFEN 800 MG TABLET: 800 | 10 days supply | Qty: 30 | Fill #0

## 2018-03-15 ENCOUNTER — Emergency Department (HOSPITAL_COMMUNITY)
Admission: EM | Admit: 2018-03-15 | Discharge: 2018-03-15 | Disposition: A | Discharge status: Home / Self Care | Payer: Self-pay | Attending: Emergency Medicine | Admitting: Emergency Medicine

## 2018-03-15 ENCOUNTER — Encounter (HOSPITAL_COMMUNITY): Payer: Self-pay | Admitting: Family Medicine

## 2018-03-15 DIAGNOSIS — F17210 Nicotine dependence, cigarettes, uncomplicated: Secondary | ICD-10-CM | POA: Insufficient documentation

## 2018-03-15 DIAGNOSIS — K029 Dental caries, unspecified: Secondary | ICD-10-CM | POA: Insufficient documentation

## 2018-03-15 DIAGNOSIS — K047 Periapical abscess without sinus: Secondary | ICD-10-CM

## 2018-03-15 DIAGNOSIS — E119 Type 2 diabetes mellitus without complications: Secondary | ICD-10-CM | POA: Insufficient documentation

## 2018-03-15 DIAGNOSIS — Z7984 Long term (current) use of oral hypoglycemic drugs: Secondary | ICD-10-CM | POA: Insufficient documentation

## 2018-03-15 DIAGNOSIS — Z79899 Other long term (current) drug therapy: Secondary | ICD-10-CM | POA: Insufficient documentation

## 2018-03-15 MED ORDER — IBUPROFEN 200 MG PO TABS
600.0000 mg | ORAL_TABLET | Freq: Once | ORAL | Status: AC
  Administered 2018-03-15: 600 mg via ORAL
  Filled 2018-03-15: qty 3

## 2018-03-15 MED ORDER — AMOXICILLIN 500 MG PO CAPS
500.0000 mg | ORAL_CAPSULE | Freq: Once | ORAL | Status: AC
  Administered 2018-03-15: 500 mg via ORAL
  Filled 2018-03-15: qty 1

## 2018-03-15 MED ORDER — AMOXICILLIN 500 MG PO CAPS: 500.0000 mg | ORAL_CAPSULE | Freq: Three times a day (TID) | ORAL | 0 refills | Status: DC

## 2018-03-15 MED ORDER — NAPROXEN 500 MG PO TABS: 500.0000 mg | ORAL_TABLET | Freq: Two times a day (BID) | ORAL | 0 refills | Status: DC

## 2018-03-15 MED ORDER — HYDROCODONE-ACETAMINOPHEN 5-325 MG PO TABS
1.0000 | ORAL_TABLET | Freq: Once | ORAL | Status: AC
  Administered 2018-03-15: 1 via ORAL
  Filled 2018-03-15: qty 1

## 2018-03-15 NOTE — Discharge Instructions (Addendum)
Take the medication as directed. Follow up with a dentist as soon as possible.
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
## 2018-03-15 NOTE — ED Triage Notes (Signed)
Patient is complaining of left upper dental pain. Relates it is a tooth abscess. Pain started yesterday. Last dose of IBUPROFEN 861m at 18:00 today.

## 2018-03-16 MED FILL — ?METFORMIN HCL 1,000 MG TAB: 1000 | 30 days supply | Qty: 60 | Fill #2

## 2018-03-31 MED FILL — IBUPROFEN 800 MG TABLET: 800 | 10 days supply | Qty: 30 | Fill #1

## 2018-04-17 ENCOUNTER — Ambulatory Visit: Payer: Self-pay | Admitting: Family Medicine

## 2018-04-18 MED FILL — IBUPROFEN 800 MG TABLET: 800 | 10 days supply | Qty: 30 | Fill #2

## 2018-04-18 MED FILL — ?METFORMIN HCL 1,000 MG TAB: 1000 | 30 days supply | Qty: 60 | Fill #3

## 2018-04-24 ENCOUNTER — Ambulatory Visit: Payer: Self-pay | Admitting: Family Medicine

## 2018-05-10 ENCOUNTER — Other Ambulatory Visit: Payer: Self-pay | Admitting: Family Medicine

## 2018-05-10 DIAGNOSIS — M25511 Pain in right shoulder: Secondary | ICD-10-CM

## 2018-05-22 ENCOUNTER — Ambulatory Visit: Payer: Self-pay | Admitting: Internal Medicine

## 2018-05-23 MED FILL — IBUPROFEN 800 MG TABLET: 800 | 10 days supply | Qty: 30 | Fill #0

## 2018-05-23 MED FILL — ?METFORMIN HCL 1,000 MG TAB: 1000 | 30 days supply | Qty: 60 | Fill #4

## 2018-06-21 MED FILL — IBUPROFEN 800 MG TABLET: 800 | 10 days supply | Qty: 30 | Fill #1

## 2018-06-21 MED FILL — ?METFORMIN HCL 1000MG TABS: 1000 | 30 days supply | Qty: 60 | Fill #5

## 2018-07-24 ENCOUNTER — Other Ambulatory Visit: Payer: Self-pay | Admitting: Family Medicine

## 2018-07-24 DIAGNOSIS — E1140 Type 2 diabetes mellitus with diabetic neuropathy, unspecified: Secondary | ICD-10-CM

## 2018-07-24 MED FILL — IBUPROFEN 800 MG TABLET: 800 | 10 days supply | Qty: 30 | Fill #2

## 2018-07-24 MED FILL — ?METFORMIN HCL 1000MG TABS: 1000 | 30 days supply | Qty: 60 | Fill #0

## 2018-08-22 ENCOUNTER — Telehealth: Payer: Self-pay

## 2018-08-22 NOTE — Telephone Encounter (Signed)
Called and spoke with patient advised of appointment for 08/24/2018. Patient will keep appointment. Thanks!

## 2018-08-24 ENCOUNTER — Ambulatory Visit (INDEPENDENT_AMBULATORY_CARE_PROVIDER_SITE_OTHER): Payer: Self-pay | Admitting: Family Medicine

## 2018-08-24 ENCOUNTER — Encounter: Payer: Self-pay | Admitting: Family Medicine

## 2018-08-24 VITALS — BP 143/66 | HR 77 | Temp 98.4°F | Resp 14 | Ht 74.0 in | Wt 211.0 lb

## 2018-08-24 DIAGNOSIS — F17209 Nicotine dependence, unspecified, with unspecified nicotine-induced disorders: Secondary | ICD-10-CM

## 2018-08-24 DIAGNOSIS — E1140 Type 2 diabetes mellitus with diabetic neuropathy, unspecified: Secondary | ICD-10-CM

## 2018-08-24 DIAGNOSIS — M25511 Pain in right shoulder: Secondary | ICD-10-CM

## 2018-08-24 DIAGNOSIS — Z1211 Encounter for screening for malignant neoplasm of colon: Secondary | ICD-10-CM

## 2018-08-24 DIAGNOSIS — K50918 Crohn's disease, unspecified, with other complication: Secondary | ICD-10-CM

## 2018-08-24 DIAGNOSIS — Z716 Tobacco abuse counseling: Secondary | ICD-10-CM

## 2018-08-24 LAB — POCT GLYCOSYLATED HEMOGLOBIN (HGB A1C): Hemoglobin A1C: 6.7 % — AB (ref 4.0–5.6)

## 2018-08-24 MED ORDER — METFORMIN HCL 1000 MG PO TABS: ORAL_TABLET | ORAL | 5 refills | Status: DC

## 2018-08-24 MED ORDER — VARENICLINE TARTRATE 0.5 MG X 11 & 1 MG X 42 PO MISC: ORAL | 0 refills | Status: DC

## 2018-08-24 MED ORDER — IBUPROFEN 800 MG PO TABS: 800.0000 mg | ORAL_TABLET | Freq: Three times a day (TID) | ORAL | 2 refills | Status: DC | PRN

## 2018-08-24 MED ORDER — ATORVASTATIN CALCIUM 10 MG PO TABS: 10.0000 mg | ORAL_TABLET | Freq: Every day | ORAL | 3 refills | Status: DC

## 2018-08-24 NOTE — Progress Notes (Signed)
Patient [facility]  Chronic Disease Follow Up Provider: Lanae Boast, FNP  SUBJECTIVE:  Patient presents for follow up for the following  chronic conditions.    Hypertension  Blood pressure is well controlled at home. Patient denies chest pain, dyspnea, fatigue and lower extremity edema. Medication compliance: Yes  Diabetes medication compliance: compliant most of the time, home glucose monitoring: is performed sporadically   Review of Systems  Constitutional: Negative.   HENT: Negative.   Eyes: Negative.   Respiratory: Negative.   Cardiovascular: Negative.   Gastrointestinal: Negative.   Genitourinary: Negative.   Musculoskeletal: Negative.   Skin: Negative.   Neurological: Negative.   Psychiatric/Behavioral: Negative.     OBJECTIVE:  BP (!) 143/66 (BP Location: Right Arm, Patient Position: Sitting, Cuff Size: Large)   Pulse 77   Temp 98.4 F (36.9 C) (Oral)   Resp 14   Ht 6' 2"  (1.88 m)   Wt 211 lb (95.7 kg)   SpO2 100%   BMI 27.09 kg/m   Physical Exam  Constitutional: He is oriented to person, place, and time and well-developed, well-nourished, and in no distress. No distress.  HENT:  Head: Normocephalic and atraumatic.  Eyes: Pupils are equal, round, and reactive to light. Conjunctivae and EOM are normal.  Neck: Normal range of motion. Neck supple.  Cardiovascular: Normal rate, regular rhythm and intact distal pulses. Exam reveals no gallop and no friction rub.  No murmur heard. Pulmonary/Chest: Effort normal and breath sounds normal. No respiratory distress. He has no wheezes.  Abdominal: Soft. Bowel sounds are normal. There is no tenderness.  Musculoskeletal: Normal range of motion. He exhibits no edema or tenderness.  Lymphadenopathy:    He has no cervical adenopathy.  Neurological: He is alert and oriented to person, place, and time. Gait normal.  Skin: Skin is warm and dry.  Psychiatric: Mood, memory,  affect and judgment normal.  Nursing note and vitals reviewed.    ASSESSMENT/PLAN:  Type 2 diabetes mellitus with diabetic neuropathy, without long-term current use of insulin ([place]) The patient is asked to make an attempt to improve diet and exercise patterns to aid in medical management of this problem. Patient is at goal. Continue with medications.  - HgB A1c - Urinalysis, Routine w reflex microscopic - atorvastatin (LIPITOR) 10 MG tablet; Take 1 tablet (10 mg total) by mouth daily.  Dispense: 90 tablet; Refill: 3 - metFORMIN (GLUCOPHAGE) 1000 MG tablet; TAKE 1 TABLET BY MOUTH 2 TIMES DAILY WITH A MEAL.  Dispense: 60 tablet; Refill: 5 - Comprehensive metabolic panel - HM DIABETES FOOT EXAM - HM DIABETES EYE EXAM; Future - Microalbumin, urine - Ambulatory referral to Ophthalmology  Tobacco use disorder, continuous Patient interested in smoking cessation. Would like to start chantix.  - varenicline (CHANTIX STARTING MONTH PAK) 0.5 MG X 11 & 1 MG X 42 tablet; Take one 0.5 mg tablet by mouth once daily for 3 days, then increase to one 0.5 mg tablet twice daily for 4 days, then increase to one 1 mg tablet twice daily.  Dispense: 53 tablet; Refill: 0  Right shoulder pain, unspecified chronicity - ibuprofen (ADVIL,MOTRIN) 800 MG tablet; Take 1 tablet (800 mg total) by mouth every 8 (eight) hours as needed.  Dispense: 30 tablet; Refill: 2  Screen for colon cancer - HM COLONOSCOPY - Ambulatory referral to Gastroenterology  Crohn's disease with other complication, unspecified gastrointestinal tract location [facility]) - HM COLONOSCOPY - Ambulatory referral to Gastroenterology  Return to care as scheduled and prn. Patient verbalized understanding and agreed with plan of care.   Ms. Doug Sou. Nathaneil Canary, FNP-BC Patient [facility] [address] [place], [place] [address] [phone]

## 2018-08-24 NOTE — Patient Instructions (Signed)
  The 2018 Physical Activity Guidelines recommend the equivalent of 150 minutes per week of moderate to vigorous aerobic activity each week, with muscle-strengthening activities on two days during the week    PRESCRIPTION FOR EXERCISE  Frequency Four to five days per week  Intensity Moderate- During moderate intensity exercise, a person is too winded to sing but is not so winded they cannot talk  Time 30 minutes or a total of 150 minutes per week.   Type Brisk walking PLUS One set each of: 0 Body weight squats  10 0 Plank hold for 30 seconds   Basic bodyweight exercise guide: Squat and plank  (A) Body weight squat: 0 The squat develops strength in the legs and torso. Stand with your feet about shoulder's width apart and slightly externally rotated. Maintain your weight on your heels or midfoot throughout the exercise. Keep your lower back flat; do not allow it to round or to extend excessively. Your knees should remain aligned with your ankles and legs throughout the motion (knees caving inward towards one another is a common problem). Descend until your hips come just below the level of your knees. If strength or mobility limitations prevent you from reaching this depth, begin with partial squats and gradually increase the depth over time. Once you can perform 15 squats with proper technique and full depth, make the exercise more challenging by holding a weight. (B) Standard plank: 0 The basic plank develops torso (core) strength. Maintain a straight torso, not allowing your lower back to sag or your hips to elevate, throughout the exercise. Gradually increase the time you can hold the position.

## 2018-08-25 LAB — URINALYSIS, ROUTINE W REFLEX MICROSCOPIC
Bilirubin, UA: NEGATIVE
Glucose, UA: NEGATIVE
Ketones, UA: NEGATIVE
Leukocytes, UA: NEGATIVE
Nitrite, UA: NEGATIVE
RBC, UA: NEGATIVE
Specific Gravity, UA: >=1.03 — AB (ref 1.005–1.030)
Urobilinogen, Ur: 0.2 mg/dL (ref 0.2–1.0)
pH, UA: 6 (ref 5.0–7.5)

## 2018-08-25 LAB — MICROSCOPIC EXAMINATION
Bacteria, UA: NONE SEEN
Casts: NONE SEEN /lpf
Epithelial Cells (non renal): NONE SEEN /hpf (ref 0–10)

## 2018-08-25 LAB — COMPREHENSIVE METABOLIC PANEL
ALT: 23 IU/L (ref 0–44)
AST: 18 IU/L (ref 0–40)
Albumin/Globulin Ratio: 1.6 (ref 1.2–2.2)
Albumin: 3.9 g/dL (ref 3.5–5.5)
Alkaline Phosphatase: 69 IU/L (ref 39–117)
BUN/Creatinine Ratio: 12 (ref 9–20)
BUN: 11 mg/dL (ref 6–24)
Bilirubin Total: 0.3 mg/dL (ref 0.0–1.2)
CO2: 22 mmol/L (ref 20–29)
Calcium: 9 mg/dL (ref 8.7–10.2)
Chloride: 107 mmol/L — ABNORMAL HIGH (ref 96–106)
Creatinine, Ser: 0.94 mg/dL (ref 0.76–1.27)
GFR calc Af Amer: 107 mL/min/{1.73_m2} (ref >59)
GFR calc non Af Amer: 93 mL/min/{1.73_m2} (ref >59)
Globulin, Total: 2.4 g/dL (ref 1.5–4.5)
Glucose: 109 mg/dL — ABNORMAL HIGH (ref 65–99)
Potassium: 4.1 mmol/L (ref 3.5–5.2)
Sodium: 144 mmol/L (ref 134–144)
Total Protein: 6.3 g/dL (ref 6.0–8.5)

## 2018-08-25 LAB — MICROALBUMIN, URINE: Microalbumin, Urine: 29.8 ug/mL

## 2018-08-25 MED FILL — metFORMIN HCL 1000 MG TABS: 1000 | 30 days supply | Qty: 60 | Fill #0

## 2018-08-25 MED FILL — IBUPROFEN 800 MG TABLET: 800 | 10 days supply | Qty: 30 | Fill #0

## 2018-08-25 MED FILL — ATORVASTATIN 10 MG TABLET: 10 | 30 days supply | Qty: 30 | Fill #0

## 2018-08-29 ENCOUNTER — Telehealth: Payer: Self-pay

## 2018-08-29 MED ORDER — LISINOPRIL 2.5 MG PO TABS: 2.5000 mg | ORAL_TABLET | Freq: Every day | ORAL | 3 refills | Status: DC

## 2018-08-29 MED FILL — LISINOPRIL 2.5 MG TABLET: 2.5 | 30 days supply | Qty: 30 | Fill #0

## 2018-08-29 NOTE — Telephone Encounter (Signed)
Called and spoke with patient. Advised that urine has mild protein and that we are starting him on Lisinopril 2.32m once daily to help protect kidney. Patient verbalized understanding. Thanks!

## 2018-08-29 NOTE — Telephone Encounter (Signed)
-----   Message from Lanae Boast, [place] sent at 08/29/2018  7:04 AM EDT ----- Please inform patient that there was a mild protein in his urine.  I am going to put him on lisinopril 2.5 mg every day to help with kidney protection.  Please also advised patient to increase water intake as this could be a sign of dehydration.  Medication was sent to pharmacy on file

## 2018-09-22 MED FILL — IBUPROFEN 800 MG TABLET: 800 | 10 days supply | Qty: 30 | Fill #1

## 2018-10-02 ENCOUNTER — Encounter: Payer: Self-pay | Admitting: Gastroenterology

## 2018-10-02 MED FILL — metFORMIN HCL 1000 MG TABS: 1000 | 30 days supply | Qty: 60 | Fill #1

## 2018-10-02 MED FILL — LISINOPRIL 2.5 MG TABLET: 2.5 | 30 days supply | Qty: 30 | Fill #1

## 2018-10-02 MED FILL — ATORVASTATIN 10 MG TABLET: 10 | 30 days supply | Qty: 30 | Fill #1

## 2018-10-05 LAB — HM DIABETES EYE EXAM

## 2018-10-18 MED FILL — IBUPROFEN 800 MG TABLET: 800 | 10 days supply | Qty: 30 | Fill #2

## 2018-10-26 ENCOUNTER — Ambulatory Visit: Payer: Self-pay | Admitting: Gastroenterology

## 2018-10-31 ENCOUNTER — Encounter: Payer: Self-pay | Admitting: Gastroenterology

## 2018-11-05 ENCOUNTER — Observation Stay (HOSPITAL_COMMUNITY): Payer: Medicaid Other

## 2018-11-05 ENCOUNTER — Encounter (HOSPITAL_COMMUNITY): Payer: Self-pay | Admitting: *Deleted

## 2018-11-05 ENCOUNTER — Other Ambulatory Visit: Payer: Self-pay

## 2018-11-05 ENCOUNTER — Emergency Department (HOSPITAL_COMMUNITY): Payer: Medicaid Other

## 2018-11-05 ENCOUNTER — Observation Stay (HOSPITAL_COMMUNITY)
Admission: EM | Admit: 2018-11-05 | Discharge: 2018-11-07 | Disposition: A | Discharge status: Home / Self Care | Payer: Medicaid Other | Attending: Internal Medicine | Admitting: Internal Medicine

## 2018-11-05 DIAGNOSIS — R030 Elevated blood-pressure reading, without diagnosis of hypertension: Secondary | ICD-10-CM | POA: Diagnosis not present

## 2018-11-05 DIAGNOSIS — Z791 Long term (current) use of non-steroidal anti-inflammatories (NSAID): Secondary | ICD-10-CM | POA: Diagnosis not present

## 2018-11-05 DIAGNOSIS — E119 Type 2 diabetes mellitus without complications: Secondary | ICD-10-CM

## 2018-11-05 DIAGNOSIS — R202 Paresthesia of skin: Secondary | ICD-10-CM | POA: Diagnosis not present

## 2018-11-05 DIAGNOSIS — G8929 Other chronic pain: Secondary | ICD-10-CM | POA: Insufficient documentation

## 2018-11-05 DIAGNOSIS — R200 Anesthesia of skin: Secondary | ICD-10-CM | POA: Diagnosis not present

## 2018-11-05 DIAGNOSIS — F17210 Nicotine dependence, cigarettes, uncomplicated: Secondary | ICD-10-CM | POA: Insufficient documentation

## 2018-11-05 DIAGNOSIS — E785 Hyperlipidemia, unspecified: Secondary | ICD-10-CM | POA: Diagnosis present

## 2018-11-05 DIAGNOSIS — Z794 Long term (current) use of insulin: Secondary | ICD-10-CM | POA: Insufficient documentation

## 2018-11-05 DIAGNOSIS — Z79899 Other long term (current) drug therapy: Secondary | ICD-10-CM | POA: Diagnosis not present

## 2018-11-05 DIAGNOSIS — Z720 Tobacco use: Secondary | ICD-10-CM | POA: Diagnosis present

## 2018-11-05 DIAGNOSIS — M79602 Pain in left arm: Secondary | ICD-10-CM | POA: Diagnosis not present

## 2018-11-05 DIAGNOSIS — I10 Essential (primary) hypertension: Secondary | ICD-10-CM

## 2018-11-05 DIAGNOSIS — M25512 Pain in left shoulder: Secondary | ICD-10-CM | POA: Insufficient documentation

## 2018-11-05 DIAGNOSIS — R079 Chest pain, unspecified: Secondary | ICD-10-CM | POA: Diagnosis present

## 2018-11-05 DIAGNOSIS — R0602 Shortness of breath: Secondary | ICD-10-CM | POA: Insufficient documentation

## 2018-11-05 DIAGNOSIS — R0789 Other chest pain: Secondary | ICD-10-CM

## 2018-11-05 DIAGNOSIS — F17200 Nicotine dependence, unspecified, uncomplicated: Secondary | ICD-10-CM

## 2018-11-05 DIAGNOSIS — K5090 Crohn's disease, unspecified, without complications: Secondary | ICD-10-CM | POA: Diagnosis not present

## 2018-11-05 LAB — GLUCOSE, CAPILLARY
Glucose-Capillary: 128 mg/dL — ABNORMAL HIGH (ref 70–99)
Glucose-Capillary: 205 mg/dL — ABNORMAL HIGH (ref 70–99)

## 2018-11-05 LAB — RAPID URINE DRUG SCREEN, HOSP PERFORMED
Amphetamines: NOT DETECTED
Barbiturates: NOT DETECTED
Benzodiazepines: NOT DETECTED
Cocaine: NOT DETECTED
Opiates: NOT DETECTED
Tetrahydrocannabinol: POSITIVE — AB

## 2018-11-05 LAB — BASIC METABOLIC PANEL WITH GFR
Anion gap: 10 (ref 5–15)
BUN: 11 mg/dL (ref 6–20)
CO2: 24 mmol/L (ref 22–32)
Calcium: 8.7 mg/dL — ABNORMAL LOW (ref 8.9–10.3)
Chloride: 106 mmol/L (ref 98–111)
Creatinine, Ser: 1.16 mg/dL (ref 0.61–1.24)
GFR calc Af Amer: >60 mL/min
GFR calc non Af Amer: >60 mL/min
Glucose, Bld: 228 mg/dL — ABNORMAL HIGH (ref 70–99)
Potassium: 4.2 mmol/L (ref 3.5–5.1)
Sodium: 140 mmol/L (ref 135–145)

## 2018-11-05 LAB — CBC
HCT: 40.2 % (ref 39.0–52.0)
Hemoglobin: 12.8 g/dL — ABNORMAL LOW (ref 13.0–17.0)
MCH: 28.6 pg (ref 26.0–34.0)
MCHC: 31.8 g/dL (ref 30.0–36.0)
MCV: 89.9 fL (ref 80.0–100.0)
Platelets: 252 10*3/uL (ref 150–400)
RBC: 4.47 MIL/uL (ref 4.22–5.81)
RDW: 15.1 % (ref 11.5–15.5)
WBC: 6.6 10*3/uL (ref 4.0–10.5)
nRBC: 0 % (ref 0.0–0.2)

## 2018-11-05 LAB — I-STAT TROPONIN, ED: Troponin i, poc: 0 ng/mL (ref 0.00–0.08)

## 2018-11-05 LAB — TROPONIN I: Troponin I: <0.03 ng/mL (ref <0.03)

## 2018-11-05 MED ORDER — INSULIN ASPART 100 UNIT/ML ~~LOC~~ SOLN
0.0000 [IU] | Freq: Every day | SUBCUTANEOUS | Status: DC
  Administered 2018-11-05: 2 [IU] via SUBCUTANEOUS

## 2018-11-05 MED ORDER — ATORVASTATIN CALCIUM 10 MG PO TABS
10.0000 mg | ORAL_TABLET | Freq: Every day | ORAL | Status: DC
  Administered 2018-11-06 – 2018-11-07 (×2): 10 mg via ORAL
  Filled 2018-11-05 (×2): qty 1

## 2018-11-05 MED ORDER — IOHEXOL 300 MG/ML  SOLN
75.0000 mL | Freq: Once | INTRAMUSCULAR | Status: AC | PRN
  Administered 2018-11-05: 75 mL via INTRAVENOUS

## 2018-11-05 MED ORDER — ASPIRIN EC 81 MG PO TBEC
81.0000 mg | DELAYED_RELEASE_TABLET | Freq: Every day | ORAL | Status: DC
  Administered 2018-11-05 – 2018-11-07 (×3): 81 mg via ORAL
  Filled 2018-11-05 (×3): qty 1

## 2018-11-05 MED ORDER — ENOXAPARIN SODIUM 40 MG/0.4ML ~~LOC~~ SOLN
40.0000 mg | SUBCUTANEOUS | Status: DC
  Administered 2018-11-06 – 2018-11-07 (×2): 40 mg via SUBCUTANEOUS
  Filled 2018-11-05 (×2): qty 0.4

## 2018-11-05 MED ORDER — INSULIN ASPART 100 UNIT/ML ~~LOC~~ SOLN
0.0000 [IU] | Freq: Three times a day (TID) | SUBCUTANEOUS | Status: DC
  Administered 2018-11-06: 1 [IU] via SUBCUTANEOUS
  Administered 2018-11-06: 3 [IU] via SUBCUTANEOUS
  Administered 2018-11-07: 5 [IU] via SUBCUTANEOUS
  Administered 2018-11-07: 3 [IU] via SUBCUTANEOUS

## 2018-11-05 MED ORDER — KETOROLAC TROMETHAMINE 30 MG/ML IJ SOLN
30.0000 mg | Freq: Four times a day (QID) | INTRAMUSCULAR | Status: DC | PRN
  Administered 2018-11-05 – 2018-11-06 (×3): 30 mg via INTRAVENOUS
  Filled 2018-11-05 (×3): qty 1

## 2018-11-05 MED ORDER — METOPROLOL TARTRATE 5 MG/5ML IV SOLN: 5.0000 mg | Freq: Four times a day (QID) | INTRAVENOUS | Status: DC | PRN

## 2018-11-05 MED ORDER — NITROGLYCERIN 0.4 MG SL SUBL
0.4000 mg | SUBLINGUAL_TABLET | SUBLINGUAL | Status: DC | PRN
  Administered 2018-11-05: 0.4 mg via SUBLINGUAL
  Filled 2018-11-05: qty 1

## 2018-11-05 MED ORDER — ACETAMINOPHEN 325 MG PO TABS
650.0000 mg | ORAL_TABLET | ORAL | Status: DC | PRN
  Administered 2018-11-05 – 2018-11-07 (×2): 650 mg via ORAL
  Filled 2018-11-05 (×2): qty 2

## 2018-11-05 MED ORDER — LISINOPRIL 5 MG PO TABS
2.5000 mg | ORAL_TABLET | Freq: Every day | ORAL | Status: DC
  Administered 2018-11-06: 2.5 mg via ORAL
  Filled 2018-11-05: qty 1

## 2018-11-05 MED ORDER — HYDROMORPHONE HCL 1 MG/ML IJ SOLN
1.0000 mg | Freq: Once | INTRAMUSCULAR | Status: AC
  Administered 2018-11-05: 1 mg via INTRAVENOUS
  Filled 2018-11-05: qty 1

## 2018-11-05 MED ORDER — MORPHINE SULFATE (PF) 2 MG/ML IV SOLN: 2.0000 mg | Freq: Once | INTRAVENOUS | Status: DC

## 2018-11-05 MED ORDER — ONDANSETRON HCL 4 MG/2ML IJ SOLN: 4.0000 mg | Freq: Four times a day (QID) | INTRAMUSCULAR | Status: DC | PRN

## 2018-11-05 MED ORDER — POLYETHYLENE GLYCOL 3350 17 G PO PACK: 17.0000 g | PACK | Freq: Every day | ORAL | Status: DC | PRN

## 2018-11-05 MED ORDER — LINAGLIPTIN 5 MG PO TABS
5.0000 mg | ORAL_TABLET | Freq: Every day | ORAL | Status: DC
  Administered 2018-11-06 – 2018-11-07 (×2): 5 mg via ORAL
  Filled 2018-11-05 (×2): qty 1
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
## 2018-11-05 NOTE — ED Triage Notes (Signed)
PT reports he wants real pain meds for his LT arm. Pt denies any CP. ED ,PA notified.

## 2018-11-05 NOTE — Plan of Care (Signed)
  Problem: Education: Goal: Knowledge of General Education information will improve Description Including pain rating scale, medication(s)/side effects and non-pharmacologic comfort measures Outcome: Progressing Note:  POC reviewed with pt.   
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
## 2018-11-05 NOTE — ED Triage Notes (Signed)
C.G. ,PA spoke with Pt  To report narcotics would not be given for arm pain.

## 2018-11-05 NOTE — ED Notes (Signed)
Pt aware of need for urine sample, urinal at bedside 

## 2018-11-06 DIAGNOSIS — E1140 Type 2 diabetes mellitus with diabetic neuropathy, unspecified: Secondary | ICD-10-CM

## 2018-11-06 DIAGNOSIS — E785 Hyperlipidemia, unspecified: Secondary | ICD-10-CM

## 2018-11-06 DIAGNOSIS — R079 Chest pain, unspecified: Principal | ICD-10-CM

## 2018-11-06 LAB — GLUCOSE, CAPILLARY
Glucose-Capillary: 121 mg/dL — ABNORMAL HIGH (ref 70–99)
Glucose-Capillary: 78 mg/dL (ref 70–99)
Glucose-Capillary: 182 mg/dL — ABNORMAL HIGH (ref 70–99)

## 2018-11-06 LAB — BASIC METABOLIC PANEL
Anion gap: 7 (ref 5–15)
BUN: 11 mg/dL (ref 6–20)
CO2: 29 mmol/L (ref 22–32)
Calcium: 8.8 mg/dL — ABNORMAL LOW (ref 8.9–10.3)
Chloride: 106 mmol/L (ref 98–111)
Creatinine, Ser: 1.06 mg/dL (ref 0.61–1.24)
GFR calc Af Amer: >60 mL/min (ref >60)
GFR calc non Af Amer: >60 mL/min (ref >60)
Glucose, Bld: 221 mg/dL — ABNORMAL HIGH (ref 70–99)
Potassium: 3.8 mmol/L (ref 3.5–5.1)
Sodium: 142 mmol/L (ref 135–145)

## 2018-11-06 LAB — CBC
HCT: 38.9 % — ABNORMAL LOW (ref 39.0–52.0)
Hemoglobin: 12.1 g/dL — ABNORMAL LOW (ref 13.0–17.0)
MCH: 27.4 pg (ref 26.0–34.0)
MCHC: 31.1 g/dL (ref 30.0–36.0)
MCV: 88 fL (ref 80.0–100.0)
Platelets: 231 10*3/uL (ref 150–400)
RBC: 4.42 MIL/uL (ref 4.22–5.81)
RDW: 14.9 % (ref 11.5–15.5)
WBC: 7.3 10*3/uL (ref 4.0–10.5)
nRBC: 0 % (ref 0.0–0.2)

## 2018-11-06 LAB — LIPID PANEL
Cholesterol: 98 mg/dL (ref 0–200)
HDL: 29 mg/dL — ABNORMAL LOW (ref >40)
LDL Cholesterol: 52 mg/dL (ref 0–99)
Total CHOL/HDL Ratio: 3.4 RATIO
Triglycerides: 86 mg/dL (ref <150)
VLDL: 17 mg/dL (ref 0–40)

## 2018-11-06 LAB — TROPONIN I: Troponin I: 0.03 ng/mL (ref <0.03)

## 2018-11-06 LAB — HIV ANTIBODY (ROUTINE TESTING W REFLEX): HIV Screen 4th Generation wRfx: NONREACTIVE

## 2018-11-06 MED ORDER — METOPROLOL TARTRATE 12.5 MG HALF TABLET
12.5000 mg | ORAL_TABLET | Freq: Two times a day (BID) | ORAL | Status: DC
  Administered 2018-11-06 – 2018-11-07 (×2): 12.5 mg via ORAL
  Filled 2018-11-06 (×2): qty 1

## 2018-11-06 NOTE — Plan of Care (Signed)
  Problem: Activity: Goal: Risk for activity intolerance will decrease Outcome: Progressing   Problem: Pain Managment: Goal: General experience of comfort will improve Outcome: Progressing   Problem: Safety: Goal: Ability to remain free from injury will improve Outcome: Progressing   
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
## 2018-11-06 NOTE — Progress Notes (Addendum)
CRITICAL VALUE ALERT  Critical Value: troponin 0.03  Date & Time Notied:  11/06/18 0050  Provider Notified:Bodenheimer,NP  Orders Received/Actions taken: none

## 2018-11-06 NOTE — Progress Notes (Signed)
PROGRESS NOTE        PATIENT DETAILS Name: Jared [name] Age: 52 y.o. Sex: male Date of Birth: Apr 11, 1966 Admit Date: 11/05/2018 Admitting Physician Quintella Baton, MD FYB:[id number], Venora Maples, FNP  Brief Narrative: Patient is a 52 y.o. male with history of DM-2 presenting to the hospital for evaluation of chest pain.  See below for further details  Subjective: Some mild left shoulder blade pain-but no chest pain.  Assessment/Plan: Chest pain: With both typical and atypical features, EKG nonacute-troponin not significantly elevated.  For nuclear stress test tomorrow.  Appreciate cardiology input.  DM-2: CBG stable with SSI-resume oral hypoglycemic agents on discharge.  Hypertension: Blood pressure stable with lisinopril  Dyslipidemia: Continue statin.  DVT Prophylaxis: Prophylactic Lovenox  Code Status: Full code  Family Communication: None at bedside  Disposition Plan: Remain inpatient-Home tomorrow-if nuclear stress test is negative  Antimicrobial agents: Anti-infectives (From admission, onward)   None      Procedures: None  CONSULTS:  cardiology  Time spent: 25- minutes-Greater than 50% of this time was spent in counseling, explanation of diagnosis, planning of further management, and coordination of care.  MEDICATIONS: Scheduled Meds: . aspirin EC  81 mg Oral Daily  . atorvastatin  10 mg Oral q1800  . enoxaparin (LOVENOX) injection  40 mg Subcutaneous Q24H  . insulin aspart  0-15 Units Subcutaneous TID WC  . insulin aspart  0-5 Units Subcutaneous QHS  . linagliptin  5 mg Oral Daily  . lisinopril  2.5 mg Oral Daily   Continuous Infusions: PRN Meds:.acetaminophen, ketorolac, metoprolol tartrate, nitroGLYCERIN, ondansetron (ZOFRAN) IV, polyethylene glycol   PHYSICAL EXAM: Vital signs: Vitals:   11/06/18 0023 11/06/18 0454 11/06/18 0853 11/06/18 1312  BP: 135/74 134/77 (!) 143/83 (!) 154/79  Pulse: 74 73  73  Resp: 18 18  18     Temp: 98.2 F (36.8 C) 98.2 F (36.8 C)  98 F (36.7 C)  TempSrc: Oral Oral  Oral  SpO2: 100% 99%  100%  Weight:  94.5 kg    Height:       Filed Weights   11/05/18 1441 11/05/18 1759 11/06/18 0454  Weight: 95.3 kg 94.8 kg 94.5 kg   Body mass index is 26.74 kg/m.   General appearance :Awake, alert, not in any distress. Speech Clear. Not toxic Looking Eyes:, pupils equally reactive to light and accomodation,no scleral icterus.Pink conjunctiva HEENT: Atraumatic and Normocephalic Neck: supple, no JVD. No cervical lymphadenopathy. No thyromegaly Resp:Good air entry bilaterally, no added sounds  CVS: S1 S2 regular, no murmurs.  GI: Bowel sounds present, Non tender and not distended with no gaurding, rigidity or rebound.No organomegaly Extremities: B/L Lower Ext shows no edema, both legs are warm to touch Neurology:  speech clear,Non focal, sensation is grossly intact. Psychiatric: Normal judgment and insight. Alert and oriented x 3. Normal mood. Musculoskeletal:No digital cyanosis Skin:No Rash, warm and dry Wounds:N/A  I have personally reviewed following labs and imaging studies  LABORATORY DATA: CBC: Recent Labs  Lab 11/05/18 1449 11/06/18 0040  WBC 6.6 7.3  HGB 12.8* 12.1*  HCT 40.2 38.9*  MCV 89.9 88.0  PLT 252 585    Basic Metabolic Panel: Recent Labs  Lab 11/05/18 1449 11/06/18 0040  NA 140 142  K 4.2 3.8  CL 106 106  CO2 24 29  GLUCOSE 228* 221*  BUN 11 11  CREATININE 1.16 1.06  CALCIUM 8.7* 8.8*    GFR: Estimated Creatinine Clearance: 94.8 mL/min (by C-G formula based on SCr of 1.06 mg/dL).  Liver Function Tests: No results for input(s): AST, ALT, ALKPHOS, BILITOT, PROT, ALBUMIN in the last 168 hours. No results for input(s): LIPASE, AMYLASE in the last 168 hours. No results for input(s): AMMONIA in the last 168 hours.  Coagulation Profile: No results for input(s): INR, PROTIME in the last 168 hours.  Cardiac Enzymes: Recent Labs  Lab  11/05/18 2003 11/05/18 2326  TROPONINI <0.03 0.03*    BNP (last 3 results) No results for input(s): PROBNP in the last 8760 hours.  HbA1C: No results for input(s): HGBA1C in the last 72 hours.  CBG: Recent Labs  Lab 11/05/18 1816 11/05/18 2140 11/06/18 0759  GLUCAP 128* 205* 121*    Lipid Profile: Recent Labs    11/06/18 0040  CHOL 98  HDL 29*  LDLCALC 52  TRIG 86  CHOLHDL 3.4    Thyroid Function Tests: No results for input(s): TSH, T4TOTAL, FREET4, T3FREE, THYROIDAB in the last 72 hours.  Anemia Panel: No results for input(s): VITAMINB12, FOLATE, FERRITIN, TIBC, IRON, RETICCTPCT in the last 72 hours.  Urine analysis:    Component Value Date/Time   COLORURINE YELLOW 05/10/2014 1208   APPEARANCEUR Clear 08/24/2018 1132   LABSPEC 1.015 12/15/2017 1417   PHURINE 5.5 12/15/2017 1417   GLUCOSEU Negative 08/24/2018 1132   HGBUR NEGATIVE 12/15/2017 1417   BILIRUBINUR Negative 08/24/2018 1132   KETONESUR NEGATIVE 12/15/2017 1417   PROTEINUR 1+ (A) 08/24/2018 1132   PROTEINUR NEGATIVE 12/15/2017 1417   UROBILINOGEN 0.2 12/15/2017 1417   NITRITE Negative 08/24/2018 1132   NITRITE NEGATIVE 12/15/2017 1417   LEUKOCYTESUR Negative 08/24/2018 1132    Sepsis Labs: Lactic Acid, Venous    Component Value Date/Time   LATICACIDVEN 1.0 01/20/2012 1146    MICROBIOLOGY: No results found for this or any previous visit (from the past 240 hour(s)).  RADIOLOGY STUDIES/RESULTS: Dg Chest 2 View  Result Date: 11/05/2018 CLINICAL DATA:  Left scapular pain, left shoulder pain and left chest pain. EXAM: CHEST - 2 VIEW COMPARISON:  07/28/2017 FINDINGS: The heart size and mediastinal contours are within normal limits. Atelectasis at both lung bases, right greater than left. There is no evidence of pulmonary edema, consolidation, pneumothorax, nodule or pleural fluid. The visualized skeletal structures are unremarkable. IMPRESSION: Bibasilar atelectasis.  No acute findings.  Electronically Signed   By: Aletta Edouard M.D.   On: 11/05/2018 15:41   Ct Head Wo Contrast  Result Date: 11/05/2018 CLINICAL DATA:  Left arm pain and left chest pain. Previous neck trauma. EXAM: CT HEAD WITHOUT CONTRAST TECHNIQUE: Contiguous axial images were obtained from the base of the skull through the vertex without intravenous contrast. COMPARISON:  None. FINDINGS: Brain: The brain shows a normal appearance without evidence of malformation, atrophy, old or acute small or large vessel infarction, mass lesion, hemorrhage, hydrocephalus or extra-axial collection. Vascular: No hyperdense vessel. No evidence of atherosclerotic calcification. Skull: Normal. No traumatic finding. No focal bone lesion. Sinuses/Orbits: Sinuses are clear. Orbits appear normal. Mastoids are clear. Other: None significant IMPRESSION: Normal head CT. Electronically Signed   By: Nelson Chimes M.D.   On: 11/05/2018 19:35   Ct Soft Tissue Neck W Contrast  Result Date: 11/05/2018 CLINICAL DATA:  Left-sided arm pain and left-sided chest pain. EXAM: CT NECK WITH CONTRAST TECHNIQUE: Multidetector CT imaging of the neck was performed using the standard protocol following the bolus administration of intravenous contrast.  CONTRAST:  40m OMNIPAQUE IOHEXOL 300 MG/ML  SOLN COMPARISON:  07/28/2017 FINDINGS: Pharynx and larynx: Normal Salivary glands: Parotid and submandibular glands are normal. Thyroid: Normal Lymph nodes: No enlarged or low-density nodes on either side of the neck. Vascular: No vascular abnormality seen. This study was not done in the form of a CT angiogram. Limited intracranial: Normal Visualized orbits: Not included Mastoids and visualized paranasal sinuses: Clear Skeleton: Healed fractures at C1, C2 and C3. Previous fracture lines are still visible, but are healed. No acute injury. Chronic bridging anterior osteophytes from C2 through C6. probable diffuse idiopathic skeletal hyperostosis. Upper chest: Negative Other:  None IMPRESSION: 1. No soft tissue abnormality seen in the neck. 2. Healed fractures at C1, C2 and C3. Previous fracture lines are still visible, but are healed. 3. The study was not ordered or performed in the form of a CT angiography study. Electronically Signed   By: MNelson ChimesM.D.   On: 11/05/2018 19:33     LOS: 0 days   SOren Binet MD  [facility]  If 7PM-7AM, please contact night-coverage  Please page via [email or web address]-click on MD name and type text message  11/06/2018, 1:52 PM

## 2018-11-07 ENCOUNTER — Observation Stay (HOSPITAL_BASED_OUTPATIENT_CLINIC_OR_DEPARTMENT_OTHER): Payer: Medicaid Other

## 2018-11-07 DIAGNOSIS — I370 Nonrheumatic pulmonary valve stenosis: Secondary | ICD-10-CM

## 2018-11-07 DIAGNOSIS — R079 Chest pain, unspecified: Secondary | ICD-10-CM

## 2018-11-07 DIAGNOSIS — I10 Essential (primary) hypertension: Secondary | ICD-10-CM

## 2018-11-07 DIAGNOSIS — I351 Nonrheumatic aortic (valve) insufficiency: Secondary | ICD-10-CM

## 2018-11-07 LAB — BASIC METABOLIC PANEL
Anion gap: 9 (ref 5–15)
BUN: 14 mg/dL (ref 6–20)
CO2: 25 mmol/L (ref 22–32)
Calcium: 8.4 mg/dL — ABNORMAL LOW (ref 8.9–10.3)
Chloride: 104 mmol/L (ref 98–111)
Creatinine, Ser: 1.03 mg/dL (ref 0.61–1.24)
GFR calc Af Amer: >60 mL/min (ref >60)
GFR calc non Af Amer: >60 mL/min (ref >60)
Glucose, Bld: 133 mg/dL — ABNORMAL HIGH (ref 70–99)
Potassium: 3.8 mmol/L (ref 3.5–5.1)
Sodium: 138 mmol/L (ref 135–145)

## 2018-11-07 LAB — GLUCOSE, CAPILLARY
Glucose-Capillary: 129 mg/dL — ABNORMAL HIGH (ref 70–99)
Glucose-Capillary: 120 mg/dL — ABNORMAL HIGH (ref 70–99)
Glucose-Capillary: 220 mg/dL — ABNORMAL HIGH (ref 70–99)
Glucose-Capillary: 178 mg/dL — ABNORMAL HIGH (ref 70–99)

## 2018-11-07 LAB — ECHOCARDIOGRAM COMPLETE
Height: 74 in
Weight: 3315.2 oz

## 2018-11-07 LAB — NM MYOCAR MULTI W/SPECT W/WALL MOTION / EF
Estimated workload: 1 METS
Exercise duration (min): 0 min
Exercise duration (sec): 0 s
Peak HR: 97 {beats}/min
Rest HR: 71 {beats}/min

## 2018-11-07 MED ORDER — LOSARTAN POTASSIUM 50 MG PO TABS
50.0000 mg | ORAL_TABLET | Freq: Every day | ORAL | Status: DC
  Administered 2018-11-07: 50 mg via ORAL
  Filled 2018-11-07: qty 1

## 2018-11-07 MED ORDER — TECHNETIUM TC 99M TETROFOSMIN IV KIT
30.0000 | PACK | Freq: Once | INTRAVENOUS | Status: AC | PRN
  Administered 2018-11-07: 30 via INTRAVENOUS

## 2018-11-07 MED ORDER — TECHNETIUM TC 99M TETROFOSMIN IV KIT
10.0000 | PACK | Freq: Once | INTRAVENOUS | Status: AC | PRN
  Administered 2018-11-07: 10 via INTRAVENOUS

## 2018-11-07 MED ORDER — REGADENOSON 0.4 MG/5ML IV SOLN
INTRAVENOUS | Status: AC
  Administered 2018-11-07: 0.4 mg via INTRAVENOUS
  Filled 2018-11-07: qty 5

## 2018-11-07 MED ORDER — LISINOPRIL 5 MG PO TABS
5.0000 mg | ORAL_TABLET | Freq: Every day | ORAL | Status: DC
  Administered 2018-11-07: 5 mg via ORAL
  Filled 2018-11-07: qty 1

## 2018-11-07 MED ORDER — CARVEDILOL 6.25 MG PO TABS
6.2500 mg | ORAL_TABLET | Freq: Two times a day (BID) | ORAL | Status: DC
  Administered 2018-11-07: 6.25 mg via ORAL
  Filled 2018-11-07: qty 1

## 2018-11-07 MED ORDER — SPIRONOLACTONE 12.5 MG HALF TABLET
12.5000 mg | ORAL_TABLET | Freq: Every day | ORAL | Status: DC
  Administered 2018-11-07: 12.5 mg via ORAL
  Filled 2018-11-07 (×2): qty 1

## 2018-11-07 MED ORDER — LISINOPRIL 20 MG PO TABS: 20.0000 mg | ORAL_TABLET | Freq: Every day | ORAL | 0 refills | Status: DC

## 2018-11-07 MED ORDER — REGADENOSON 0.4 MG/5ML IV SOLN
0.4000 mg | Freq: Once | INTRAVENOUS | Status: AC
  Administered 2018-11-07: 0.4 mg via INTRAVENOUS
  Filled 2018-11-07: qty 5

## 2018-11-07 NOTE — Progress Notes (Signed)
  Echocardiogram 2D Echocardiogram has been performed.  Jared [name] 11/07/2018, 2:30 PM

## 2018-11-07 NOTE — Discharge Summary (Signed)
PATIENT DETAILS Name: Jared [name] Age: 52 y.o. Sex: male Date of Birth: [birth year] MRN: [id number]. Admitting Physician: Quintella Baton, MD BJS:[id number], Venora Maples, FNP  Admit Date: 11/05/2018 Discharge date: 11/07/2018  Recommendations for Outpatient Follow-up:  1. Follow up with PCP in 1-2 weeks 2. Please obtain BMP/CBC in one week 3. Continue to optimize antihypertensive medications in the outpatient setting  Admitted From:  Home  Disposition: [place]: No  Equipment/Devices: None  Discharge Condition: Stable  CODE STATUS: FULL CODE  Diet recommendation:  Heart Healthy / Carb Modified  Brief Summary: See H&P, Labs, Consult and Test reports for all details in brief, Patient is a 52 y.o. male with history of DM-2 presenting to the hospital for evaluation of chest pain.  See below for further details  Brief Hospital Course: Chest pain: With both typical and atypical features, EKG nonacute-troponin not significantly elevated.  Underwent nuclear stress test-no ischemia-but EF was read to be around 16%.  Patient did not have any clinical features consistent with EF such low.  Hence cardiology suggested we get a transthoracic echocardiogram, which shows a preserved EF.  Spoke with cardiology-Dr. Acie Fredrickson no further recommendations, okay to discharge.  He was empirically started on losartan, Coreg and Aldactone thinking that he may have cardiomyopathy-however since his EF is within normal limits-we will just continue his lisinopril on discharge but increase it to 20 mg.  Patient is to follow-up with his primary care practitioner for further optimization.  If he continues to have shoulder pain in the future, he may need further work-up-but this can be done in the outpatient setting.  DM-2: CBG stable with SSI.  Resume oral hypoglycemic agents on discharge.  Hypertension: Blood pressure side-we will increase lisinopril to 20 mg daily and have him follow-up with his PCP.     Dyslipidemia: Continue statin  Procedures/Studies: None  Discharge Diagnoses:  Principal Problem:   Chest pain Active Problems:   DM (diabetes mellitus) ([place])   Crohn's disease ([place])   Tobacco use disorder   Dyslipidemia   HTN (hypertension)   Discharge Instructions:  Activity:  As tolerated    Discharge Instructions    Call MD for:  difficulty breathing, headache or visual disturbances   Complete by:  As directed    Call MD for:  severe uncontrolled pain   Complete by:  As directed    Diet - low sodium heart healthy   Complete by:  As directed    Discharge instructions   Complete by:  As directed    Follow with Primary MD  Lanae Boast, FNP in 1 week  Please get a complete blood count and chemistry panel checked by your Primary MD at your next visit, and again as instructed by your Primary MD.  Get Medicines reviewed and adjusted: Please take all your medications with you for your next visit with your Primary MD  Laboratory/radiological data: Please request your Primary MD to go over all hospital tests and procedure/radiological results at the follow up, please ask your Primary MD to get all Hospital records sent to his/her office.  In some cases, they will be blood work, cultures and biopsy results pending at the time of your discharge. Please request that your primary care M.D. follows up on these results.  Also Note the following: If you experience worsening of your admission symptoms, develop shortness of breath, life threatening emergency, suicidal or homicidal thoughts you must seek medical attention immediately by calling 911 or calling your MD  immediately  if symptoms less severe.  You must read complete instructions/literature along with all the possible adverse reactions/side effects for all the Medicines you take and that have been prescribed to you. Take any new Medicines after you have completely understood and accpet all the possible adverse  reactions/side effects.   Do not drive when taking Pain medications or sleeping medications (Benzodaizepines)  Do not take more than prescribed Pain, Sleep and Anxiety Medications. It is not advisable to combine anxiety,sleep and pain medications without talking with your primary care practitioner  Special Instructions: If you have smoked or chewed Tobacco  in the last 2 yrs please stop smoking, stop any regular Alcohol  and or any Recreational drug use.  Wear Seat belts while driving.  Please note: You were cared for by a hospitalist during your hospital stay. Once you are discharged, your primary care physician will handle any further medical issues. Please note that NO REFILLS for any discharge medications will be authorized once you are discharged, as it is imperative that you return to your primary care physician (or establish a relationship with a primary care physician if you do not have one) for your post hospital discharge needs so that they can reassess your need for medications and monitor your lab values.   Increase activity slowly   Complete by:  As directed      Allergies as of 11/07/2018   No Known Allergies     Medication List    TAKE these medications   atorvastatin 10 MG tablet Commonly known as:  LIPITOR Take 1 tablet (10 mg total) by mouth daily.   glucose blood test strip Commonly known as:  TRUE METRIX BLOOD GLUCOSE TEST Use as instructed   ibuprofen 800 MG tablet Commonly known as:  ADVIL,MOTRIN Take 1 tablet (800 mg total) by mouth every 8 (eight) hours as needed.   lisinopril 20 MG tablet Commonly known as:  PRINIVIL,ZESTRIL Take 1 tablet (20 mg total) by mouth daily. What changed:    medication strength  how much to take   metFORMIN 1000 MG tablet Commonly known as:  GLUCOPHAGE TAKE 1 TABLET BY MOUTH 2 TIMES DAILY WITH A MEAL.   PEN NEEDLES 29GX1/2" 29G X 12MM Misc Check blood sugar twice a day before breakfast and two hours after dinner.     sitaGLIPtin 50 MG tablet Commonly known as:  JANUVIA Take 1 tablet (50 mg total) by mouth daily.   TRUE METRIX METER w/Device Kit 1 each by Does not apply route 4 (four) times daily -  before meals and at bedtime.   varenicline 0.5 MG X 11 & 1 MG X 42 tablet Commonly known as:  CHANTIX STARTING MONTH PAK Take one 0.5 mg tablet by mouth once daily for 3 days, then increase to one 0.5 mg tablet twice daily for 4 days, then increase to one 1 mg tablet twice daily.      Follow-up Information    Lanae Boast, [place]. Schedule an appointment as soon as possible for a visit in 1 week(s).   Specialty:  Family Medicine Contact information: [place]  [id number] [phone]          No Known Allergies  Consultations:   cardiology   Other Procedures/Studies: Dg Chest 2 View  Result Date: 11/05/2018 CLINICAL DATA:  Left scapular pain, left shoulder pain and left chest pain. EXAM: CHEST - 2 VIEW COMPARISON:  07/28/2017 FINDINGS: The heart size and mediastinal contours are  within normal limits. Atelectasis at both lung bases, right greater than left. There is no evidence of pulmonary edema, consolidation, pneumothorax, nodule or pleural fluid. The visualized skeletal structures are unremarkable. IMPRESSION: Bibasilar atelectasis.  No acute findings. Electronically Signed   By: Aletta Edouard M.D.   On: 11/05/2018 15:41   Ct Head Wo Contrast  Result Date: 11/05/2018 CLINICAL DATA:  Left arm pain and left chest pain. Previous neck trauma. EXAM: CT HEAD WITHOUT CONTRAST TECHNIQUE: Contiguous axial images were obtained from the base of the skull through the vertex without intravenous contrast. COMPARISON:  None. FINDINGS: Brain: The brain shows a normal appearance without evidence of malformation, atrophy, old or acute small or large vessel infarction, mass lesion, hemorrhage, hydrocephalus or extra-axial collection. Vascular: No hyperdense vessel. No evidence of  atherosclerotic calcification. Skull: Normal. No traumatic finding. No focal bone lesion. Sinuses/Orbits: Sinuses are clear. Orbits appear normal. Mastoids are clear. Other: None significant IMPRESSION: Normal head CT. Electronically Signed   By: Nelson Chimes M.D.   On: 11/05/2018 19:35   Ct Soft Tissue Neck W Contrast  Result Date: 11/05/2018 CLINICAL DATA:  Left-sided arm pain and left-sided chest pain. EXAM: CT NECK WITH CONTRAST TECHNIQUE: Multidetector CT imaging of the neck was performed using the standard protocol following the bolus administration of intravenous contrast. CONTRAST:  14m OMNIPAQUE IOHEXOL 300 MG/ML  SOLN COMPARISON:  07/28/2017 FINDINGS: Pharynx and larynx: Normal Salivary glands: Parotid and submandibular glands are normal. Thyroid: Normal Lymph nodes: No enlarged or low-density nodes on either side of the neck. Vascular: No vascular abnormality seen. This study was not done in the form of a CT angiogram. Limited intracranial: Normal Visualized orbits: Not included Mastoids and visualized paranasal sinuses: Clear Skeleton: Healed fractures at C1, C2 and C3. Previous fracture lines are still visible, but are healed. No acute injury. Chronic bridging anterior osteophytes from C2 through C6. probable diffuse idiopathic skeletal hyperostosis. Upper chest: Negative Other: None IMPRESSION: 1. No soft tissue abnormality seen in the neck. 2. Healed fractures at C1, C2 and C3. Previous fracture lines are still visible, but are healed. 3. The study was not ordered or performed in the form of a CT angiography study. Electronically Signed   By: MNelson ChimesM.D.   On: 11/05/2018 19:33   Nm Myocar Multi W/spect W/wall Motion / Ef  Result Date: 11/07/2018 CLINICAL DATA:  Shoulder and jaw pain EXAM: MYOCARDIAL IMAGING WITH SPECT (REST AND PHARMACOLOGIC-STRESS) GATED LEFT VENTRICULAR WALL MOTION STUDY LEFT VENTRICULAR EJECTION FRACTION TECHNIQUE: Standard myocardial SPECT imaging was performed  after resting intravenous injection of 10 mCi Tc-967metrofosmin. Subsequently, intravenous infusion of Lexiscan was performed under the supervision of the Cardiology staff. At peak effect of the drug, 30 mCi Tc-9967mtrofosmin was injected intravenously and standard myocardial SPECT imaging was performed. Quantitative gated imaging was also performed to evaluate left ventricular wall motion, and estimate left ventricular ejection fraction. COMPARISON:  None. FINDINGS: Perfusion: Decreased activity is noted inferiorly on both rest and stress images. This could reflect scar or soft tissue attenuation. No reversible defects to suggest ischemia. Wall Motion: Severe generalized hypokinesia. Left Ventricular Ejection Fraction: 16 % End diastolic volume 139045 End systolic volume 117409 IMPRESSION: 1. Fixed defect inferiorly could reflect old infarct/scar or attenuation. No ischemia. 2. Severe generalized hypokinesia. 3. Left ventricular ejection fraction 16% 4. Non invasive risk stratification*: High *2012 Appropriate Use Criteria for Coronary Revascularization Focused Update: J Am Coll Cardiol. 2018119;14(7):829-562ttp://[email or web address] Electronically Signed   By: KevLennette Bihari  [place] M.D.   On: 11/07/2018 12:21      TODAY-DAY OF DISCHARGE:  Subjective:   Jared [name] today has no headache,no chest abdominal pain,no new weakness tingling or numbness, feels much better wants to go home today.   Objective:   Blood pressure (!) 155/82, pulse 74, temperature 98.5 F (36.9 C), temperature source Oral, resp. rate 17, height _0  (1.88 m), weight 94 kg, SpO2 98 %.  Intake/Output Summary (Last 24 hours) at 11/07/2018 1726 Last data filed at 11/07/2018 1719 Gross per 24 hour  Intake 600 ml  Output 1075 ml  Net -475 ml   Filed Weights   11/05/18 1759 11/06/18 0454 11/07/18 0417  Weight: 94.8 kg 94.5 kg 94 kg    Exam: Awake Alert, Oriented *3, No new F.N deficits, Normal  affect [place].AT,PERRAL Supple Neck,No JVD, No cervical lymphadenopathy appriciated.  Symmetrical Chest wall movement, Good air movement bilaterally, CTAB RRR,No Gallops,Rubs or new Murmurs, No Parasternal Heave +ve B.Sounds, Abd Soft, Non tender, No organomegaly appriciated, No rebound -guarding or rigidity. No Cyanosis, Clubbing or edema, No new Rash or bruise   PERTINENT RADIOLOGIC STUDIES: Dg Chest 2 View  Result Date: 11/05/2018 CLINICAL DATA:  Left scapular pain, left shoulder pain and left chest pain. EXAM: CHEST - 2 VIEW COMPARISON:  07/28/2017 FINDINGS: The heart size and mediastinal contours are within normal limits. Atelectasis at both lung bases, right greater than left. There is no evidence of pulmonary edema, consolidation, pneumothorax, nodule or pleural fluid. The visualized skeletal structures are unremarkable. IMPRESSION: Bibasilar atelectasis.  No acute findings. Electronically Signed   By: Aletta Edouard M.D.   On: 11/05/2018 15:41   Ct Head Wo Contrast  Result Date: 11/05/2018 CLINICAL DATA:  Left arm pain and left chest pain. Previous neck trauma. EXAM: CT HEAD WITHOUT CONTRAST TECHNIQUE: Contiguous axial images were obtained from the base of the skull through the vertex without intravenous contrast. COMPARISON:  None. FINDINGS: Brain: The brain shows a normal appearance without evidence of malformation, atrophy, old or acute small or large vessel infarction, mass lesion, hemorrhage, hydrocephalus or extra-axial collection. Vascular: No hyperdense vessel. No evidence of atherosclerotic calcification. Skull: Normal. No traumatic finding. No focal bone lesion. Sinuses/Orbits: Sinuses are clear. Orbits appear normal. Mastoids are clear. Other: None significant IMPRESSION: Normal head CT. Electronically Signed   By: Nelson Chimes M.D.   On: 11/05/2018 19:35   Ct Soft Tissue Neck W Contrast  Result Date: 11/05/2018 CLINICAL DATA:  Left-sided arm pain and left-sided chest pain.  EXAM: CT NECK WITH CONTRAST TECHNIQUE: Multidetector CT imaging of the neck was performed using the standard protocol following the bolus administration of intravenous contrast. CONTRAST:  22m OMNIPAQUE IOHEXOL 300 MG/ML  SOLN COMPARISON:  07/28/2017 FINDINGS: Pharynx and larynx: Normal Salivary glands: Parotid and submandibular glands are normal. Thyroid: Normal Lymph nodes: No enlarged or low-density nodes on either side of the neck. Vascular: No vascular abnormality seen. This study was not done in the form of a CT angiogram. Limited intracranial: Normal Visualized orbits: Not included Mastoids and visualized paranasal sinuses: Clear Skeleton: Healed fractures at C1, C2 and C3. Previous fracture lines are still visible, but are healed. No acute injury. Chronic bridging anterior osteophytes from C2 through C6. probable diffuse idiopathic skeletal hyperostosis. Upper chest: Negative Other: None IMPRESSION: 1. No soft tissue abnormality seen in the neck. 2. Healed fractures at C1, C2 and C3. Previous fracture lines are still visible, but are healed. 3. The study was not ordered or  performed in the form of a CT angiography study. Electronically Signed   By: Nelson Chimes M.D.   On: 11/05/2018 19:33   Nm Myocar Multi W/spect W/wall Motion / Ef  Result Date: 11/07/2018 CLINICAL DATA:  Shoulder and jaw pain EXAM: MYOCARDIAL IMAGING WITH SPECT (REST AND PHARMACOLOGIC-STRESS) GATED LEFT VENTRICULAR WALL MOTION STUDY LEFT VENTRICULAR EJECTION FRACTION TECHNIQUE: Standard myocardial SPECT imaging was performed after resting intravenous injection of 10 mCi Tc-6mtetrofosmin. Subsequently, intravenous infusion of Lexiscan was performed under the supervision of the Cardiology staff. At peak effect of the drug, 30 mCi Tc-995metrofosmin was injected intravenously and standard myocardial SPECT imaging was performed. Quantitative gated imaging was also performed to evaluate left ventricular wall motion, and estimate left  ventricular ejection fraction. COMPARISON:  None. FINDINGS: Perfusion: Decreased activity is noted inferiorly on both rest and stress images. This could reflect scar or soft tissue attenuation. No reversible defects to suggest ischemia. Wall Motion: Severe generalized hypokinesia. Left Ventricular Ejection Fraction: 16 % End diastolic volume 13528l End systolic volume 11413l IMPRESSION: 1. Fixed defect inferiorly could reflect old infarct/scar or attenuation. No ischemia. 2. Severe generalized hypokinesia. 3. Left ventricular ejection fraction 16% 4. Non invasive risk stratification*: High *2012 Appropriate Use Criteria for Coronary Revascularization Focused Update: J Am Coll Cardiol. 202440;10(2):725-366http://[email or web address] Electronically Signed   By: KeRolm Baptise.D.   On: 11/07/2018 12:21     PERTINENT LAB RESULTS: CBC: Recent Labs    11/05/18 1449 11/06/18 0040  WBC 6.6 7.3  HGB 12.8* 12.1*  HCT 40.2 38.9*  PLT 252 231   CMET CMP     Component Value Date/Time   NA 138 11/07/2018 0455   NA 144 08/24/2018 1154   K 3.8 11/07/2018 0455   CL 104 11/07/2018 0455   CO2 25 11/07/2018 0455   GLUCOSE 133 (H) 11/07/2018 0455   BUN 14 11/07/2018 0455   BUN 11 08/24/2018 1154   CREATININE 1.03 11/07/2018 0455   CREATININE 0.98 07/06/2017 0937   CALCIUM 8.4 (L) 11/07/2018 0455   PROT 6.3 08/24/2018 1154   ALBUMIN 3.9 08/24/2018 1154   AST 18 08/24/2018 1154   ALT 23 08/24/2018 1154   ALKPHOS 69 08/24/2018 1154   BILITOT 0.3 08/24/2018 1154   GFRNONAA >60 11/07/2018 0455   GFRNONAA 89 07/06/2017 0937   GFRAA >60 11/07/2018 0455   GFRAA >89 07/06/2017 0937    GFR Estimated Creatinine Clearance: 97.5 mL/min (by C-G formula based on SCr of 1.03 mg/dL). No results for input(s): LIPASE, AMYLASE in the last 72 hours. Recent Labs    11/05/18 2003 11/05/18 2326  TROPONINI <0.03 0.03*   Invalid input(s): POCBNP No results for input(s): DDIMER  in the last 72 hours. No results for input(s): HGBA1C in the last 72 hours. Recent Labs    11/06/18 0040  CHOL 98  HDL 29*  LDLCALC 52  TRIG 86  CHOLHDL 3.4   No results for input(s): TSH, T4TOTAL, T3FREE, THYROIDAB in the last 72 hours.  Invalid input(s): FREET3 No results for input(s): VITAMINB12, FOLATE, FERRITIN, TIBC, IRON, RETICCTPCT in the last 72 hours. Coags: No results for input(s): INR in the last 72 hours.  Invalid input(s): PT Microbiology: No results found for this or any previous visit (from the past 240 hour(s)).  FURTHER DISCHARGE INSTRUCTIONS:  Get Medicines reviewed and adjusted: Please take all your medications with you for your next visit with your Primary MD  Laboratory/radiological data: Please request your Primary MD to  go over all hospital tests and procedure/radiological results at the follow up, please ask your Primary MD to get all Hospital records sent to his/her office.  In some cases, they will be blood work, cultures and biopsy results pending at the time of your discharge. Please request that your primary care M.D. goes through all the records of your hospital data and follows up on these results.  Also Note the following: If you experience worsening of your admission symptoms, develop shortness of breath, life threatening emergency, suicidal or homicidal thoughts you must seek medical attention immediately by calling 911 or calling your MD immediately  if symptoms less severe.  You must read complete instructions/literature along with all the possible adverse reactions/side effects for all the Medicines you take and that have been prescribed to you. Take any new Medicines after you have completely understood and accpet all the possible adverse reactions/side effects.   Do not drive when taking Pain medications or sleeping medications (Benzodaizepines)  Do not take more than prescribed Pain, Sleep and Anxiety Medications. It is not advisable to  combine anxiety,sleep and pain medications without talking with your primary care practitioner  Special Instructions: If you have smoked or chewed Tobacco  in the last 2 yrs please stop smoking, stop any regular Alcohol  and or any Recreational drug use.  Wear Seat belts while driving.  Please note: You were cared for by a hospitalist during your hospital stay. Once you are discharged, your primary care physician will handle any further medical issues. Please note that NO REFILLS for any discharge medications will be authorized once you are discharged, as it is imperative that you return to your primary care physician (or establish a relationship with a primary care physician if you do not have one) for your post hospital discharge needs so that they can reassess your need for medications and monitor your lab values.  Total Time spent coordinating discharge including counseling, education and face to face time equals 35 minutes.  SignedOren Binet 11/07/2018 5:26 PM

## 2018-11-07 NOTE — Progress Notes (Signed)
PROGRESS NOTE        PATIENT DETAILS Name: Jared [name] Age: 52 y.o. Sex: male Date of Birth: Mar 25, 1966 Admit Date: 11/05/2018 Admitting Physician Quintella Baton, MD [id number], Venora Maples, FNP  Brief Narrative: Patient is a 52 y.o. male with history of DM-2 presenting to the hospital for evaluation of chest pain.  See below for further details  Subjective: Some mild left shoulder blade pain-but no chest pain.  Assessment/Plan: Chest pain: With both typical and atypical features, EKG nonacute-troponin not significantly elevated.  Underwent nuclear stress test-no ischemia-but EF around 60% with diffuse hypokinesia.  Spoke with cardiology-Dr. Acie Fredrickson advises a echocardiogram (has been ordered), and if echocardiogram confirms a low EF-patient will require LHC in the next few days.  Has been losartan, Coreg, Aldactone-volume status is stable.  DM-2: CBG stable with SSI.  Follow and adjust accordingly.  Hypertension: Blood pressure controlled-continue Coreg, losartan and Aldactone.  Dyslipidemia: Continue statin  DVT Prophylaxis: Prophylactic Lovenox  Code Status: Full code  Family Communication: None at bedside  Disposition Plan: Remain inpatient-home once work-up is complete.  Await echocardiogram and further cardiology recommendations before discharge.  Antimicrobial agents: Anti-infectives (From admission, onward)   None      Procedures: None  CONSULTS:  cardiology  Time spent: 25- minutes-Greater than 50% of this time was spent in counseling, explanation of diagnosis, planning of further management, and coordination of care.  MEDICATIONS: Scheduled Meds: . aspirin EC  81 mg Oral Daily  . atorvastatin  10 mg Oral q1800  . carvedilol  6.25 mg Oral BID WC  . enoxaparin (LOVENOX) injection  40 mg Subcutaneous Q24H  . insulin aspart  0-15 Units Subcutaneous TID WC  . insulin aspart  0-5 Units Subcutaneous QHS  . linagliptin  5 mg Oral Daily   . losartan  50 mg Oral Daily  . spironolactone  12.5 mg Oral Daily   Continuous Infusions: PRN Meds:.acetaminophen, ketorolac, metoprolol tartrate, nitroGLYCERIN, ondansetron (ZOFRAN) IV, polyethylene glycol   PHYSICAL EXAM: Vital signs: Vitals:   11/07/18 0939 11/07/18 0941 11/07/18 1049 11/07/18 1237  BP: (!) 166/62 (!) 163/78 (!) 148/83 (!) 155/82  Pulse: 92 90 77 74  Resp:    17  Temp:   98.6 F (37 C) 98.5 F (36.9 C)  TempSrc:   Oral Oral  SpO2:   100% 98%  Weight:      Height:       Filed Weights   11/05/18 1759 11/06/18 0454 11/07/18 0417  Weight: 94.8 kg 94.5 kg 94 kg   Body mass index is 26.6 kg/m.   General appearance:Awake, alert, not in any distress.  Eyes:no scleral icterus. HEENT: Atraumatic and Normocephalic Neck: supple, no JVD. Resp:Good air entry bilaterally,no rales or rhonchi CVS: S1 S2 regular, no murmurs.  GI: Bowel sounds present, Non tender and not distended with no gaurding, rigidity or rebound. Extremities: B/L Lower Ext shows no edema, both legs are warm to touch Neurology:  Non focal Psychiatric: Normal judgment and insight. Normal mood. Musculoskeletal:No digital cyanosis Skin:No Rash, warm and dry Wounds:N/A I have personally reviewed following labs and imaging studies  LABORATORY DATA: CBC: Recent Labs  Lab 11/05/18 1449 11/06/18 0040  WBC 6.6 7.3  HGB 12.8* 12.1*  HCT 40.2 38.9*  MCV 89.9 88.0  PLT 252 709    Basic Metabolic Panel: Recent Labs  Lab 11/05/18 1449 11/06/18  0040 11/07/18 0455  NA 140 142 138  K 4.2 3.8 3.8  CL 106 106 104  CO2 24 29 25   GLUCOSE 228* 221* 133*  BUN 11 11 14   CREATININE 1.16 1.06 1.03  CALCIUM 8.7* 8.8* 8.4*    GFR: Estimated Creatinine Clearance: 97.5 mL/min (by C-G formula based on SCr of 1.03 mg/dL).  Liver Function Tests: No results for input(s): AST, ALT, ALKPHOS, BILITOT, PROT, ALBUMIN in the last 168 hours. No results for input(s): LIPASE, AMYLASE in the last 168  hours. No results for input(s): AMMONIA in the last 168 hours.  Coagulation Profile: No results for input(s): INR, PROTIME in the last 168 hours.  Cardiac Enzymes: Recent Labs  Lab 11/05/18 2003 11/05/18 2326  TROPONINI <0.03 0.03*    BNP (last 3 results) No results for input(s): PROBNP in the last 8760 hours.  HbA1C: No results for input(s): HGBA1C in the last 72 hours.  CBG: Recent Labs  Lab 11/06/18 1157 11/06/18 1617 11/07/18 0421 11/07/18 0750 11/07/18 1134  GLUCAP 78 182* 129* 120* 220*    Lipid Profile: Recent Labs    11/06/18 0040  CHOL 98  HDL 29*  LDLCALC 52  TRIG 86  CHOLHDL 3.4    Thyroid Function Tests: No results for input(s): TSH, T4TOTAL, FREET4, T3FREE, THYROIDAB in the last 72 hours.  Anemia Panel: No results for input(s): VITAMINB12, FOLATE, FERRITIN, TIBC, IRON, RETICCTPCT in the last 72 hours.  Urine analysis:    Component Value Date/Time   COLORURINE YELLOW 05/10/2014 1208   APPEARANCEUR Clear 08/24/2018 1132   LABSPEC 1.015 12/15/2017 1417   PHURINE 5.5 12/15/2017 1417   GLUCOSEU Negative 08/24/2018 1132   HGBUR NEGATIVE 12/15/2017 1417   BILIRUBINUR Negative 08/24/2018 1132   KETONESUR NEGATIVE 12/15/2017 1417   PROTEINUR 1+ (A) 08/24/2018 1132   PROTEINUR NEGATIVE 12/15/2017 1417   UROBILINOGEN 0.2 12/15/2017 1417   NITRITE Negative 08/24/2018 1132   NITRITE NEGATIVE 12/15/2017 1417   LEUKOCYTESUR Negative 08/24/2018 1132    Sepsis Labs: Lactic Acid, Venous    Component Value Date/Time   LATICACIDVEN 1.0 01/20/2012 1146    MICROBIOLOGY: No results found for this or any previous visit (from the past 240 hour(s)).  RADIOLOGY STUDIES/RESULTS: Dg Chest 2 View  Result Date: 11/05/2018 CLINICAL DATA:  Left scapular pain, left shoulder pain and left chest pain. EXAM: CHEST - 2 VIEW COMPARISON:  07/28/2017 FINDINGS: The heart size and mediastinal contours are within normal limits. Atelectasis at both lung bases, right  greater than left. There is no evidence of pulmonary edema, consolidation, pneumothorax, nodule or pleural fluid. The visualized skeletal structures are unremarkable. IMPRESSION: Bibasilar atelectasis.  No acute findings. Electronically Signed   By: Aletta Edouard M.D.   On: 11/05/2018 15:41   Ct Head Wo Contrast  Result Date: 11/05/2018 CLINICAL DATA:  Left arm pain and left chest pain. Previous neck trauma. EXAM: CT HEAD WITHOUT CONTRAST TECHNIQUE: Contiguous axial images were obtained from the base of the skull through the vertex without intravenous contrast. COMPARISON:  None. FINDINGS: Brain: The brain shows a normal appearance without evidence of malformation, atrophy, old or acute small or large vessel infarction, mass lesion, hemorrhage, hydrocephalus or extra-axial collection. Vascular: No hyperdense vessel. No evidence of atherosclerotic calcification. Skull: Normal. No traumatic finding. No focal bone lesion. Sinuses/Orbits: Sinuses are clear. Orbits appear normal. Mastoids are clear. Other: None significant IMPRESSION: Normal head CT. Electronically Signed   By: Nelson Chimes M.D.   On: 11/05/2018 19:35  Ct Soft Tissue Neck W Contrast  Result Date: 11/05/2018 CLINICAL DATA:  Left-sided arm pain and left-sided chest pain. EXAM: CT NECK WITH CONTRAST TECHNIQUE: Multidetector CT imaging of the neck was performed using the standard protocol following the bolus administration of intravenous contrast. CONTRAST:  6m OMNIPAQUE IOHEXOL 300 MG/ML  SOLN COMPARISON:  07/28/2017 FINDINGS: Pharynx and larynx: Normal Salivary glands: Parotid and submandibular glands are normal. Thyroid: Normal Lymph nodes: No enlarged or low-density nodes on either side of the neck. Vascular: No vascular abnormality seen. This study was not done in the form of a CT angiogram. Limited intracranial: Normal Visualized orbits: Not included Mastoids and visualized paranasal sinuses: Clear Skeleton: Healed fractures at C1, C2  and C3. Previous fracture lines are still visible, but are healed. No acute injury. Chronic bridging anterior osteophytes from C2 through C6. probable diffuse idiopathic skeletal hyperostosis. Upper chest: Negative Other: None IMPRESSION: 1. No soft tissue abnormality seen in the neck. 2. Healed fractures at C1, C2 and C3. Previous fracture lines are still visible, but are healed. 3. The study was not ordered or performed in the form of a CT angiography study. Electronically Signed   By: MNelson ChimesM.D.   On: 11/05/2018 19:33   Nm Myocar Multi W/spect W/wall Motion / Ef  Result Date: 11/07/2018 CLINICAL DATA:  Shoulder and jaw pain EXAM: MYOCARDIAL IMAGING WITH SPECT (REST AND PHARMACOLOGIC-STRESS) GATED LEFT VENTRICULAR WALL MOTION STUDY LEFT VENTRICULAR EJECTION FRACTION TECHNIQUE: Standard myocardial SPECT imaging was performed after resting intravenous injection of 10 mCi Tc-958metrofosmin. Subsequently, intravenous infusion of Lexiscan was performed under the supervision of the Cardiology staff. At peak effect of the drug, 30 mCi Tc-9939mtrofosmin was injected intravenously and standard myocardial SPECT imaging was performed. Quantitative gated imaging was also performed to evaluate left ventricular wall motion, and estimate left ventricular ejection fraction. COMPARISON:  None. FINDINGS: Perfusion: Decreased activity is noted inferiorly on both rest and stress images. This could reflect scar or soft tissue attenuation. No reversible defects to suggest ischemia. Wall Motion: Severe generalized hypokinesia. Left Ventricular Ejection Fraction: 16 % End diastolic volume 139594 End systolic volume [id number] IMPRESSION: 1. Fixed defect inferiorly could reflect old infarct/scar or attenuation. No ischemia. 2. Severe generalized hypokinesia. 3. Left ventricular ejection fraction 16% 4. Non invasive risk stratification*: High *2012 Appropriate Use Criteria for Coronary Revascularization Focused Update: J Am Coll  Cardiol. 2016151;83(4):[email or web address] Electronically Signed   By: KevRolm BaptiseD.   On: 11/07/2018 12:21     LOS: 0 days   ShaOren BinetD  [facility]  If 7PM-7AM, please contact night-coverage  Please page via [email or web address] [email or web address]-click on MD name and type text message  11/07/2018, 1:41 PM

## 2018-11-07 NOTE — Progress Notes (Addendum)
Progress Note  Patient Name: Jared [name] Date of Encounter: 11/07/2018  Primary Cardiologist: No primary care provider on file.   Subjective   No recurrent chest pain. Denies SOB or palpitations. No new issues overnight.   Inpatient Medications    Scheduled Meds: . regadenoson      . aspirin EC  81 mg Oral Daily  . atorvastatin  10 mg Oral q1800  . enoxaparin (LOVENOX) injection  40 mg Subcutaneous Q24H  . insulin aspart  0-15 Units Subcutaneous TID WC  . insulin aspart  0-5 Units Subcutaneous QHS  . linagliptin  5 mg Oral Daily  . lisinopril  2.5 mg Oral Daily  . metoprolol tartrate  12.5 mg Oral BID  . regadenoson  0.4 mg Intravenous Once   Continuous Infusions:  PRN Meds: acetaminophen, ketorolac, metoprolol tartrate, nitroGLYCERIN, ondansetron (ZOFRAN) IV, polyethylene glycol   Vital Signs    Vitals:   11/06/18 1738 11/06/18 1941 11/07/18 0417 11/07/18 0905  BP: 136/70 (!) 145/76 138/75 (!) 164/78  Pulse: 76 80 70 72  Resp: 18 18 18    Temp: 98.3 F (36.8 C) 99 F (37.2 C) 98.3 F (36.8 C)   TempSrc: Oral Oral Oral   SpO2: 100% 99% 99%   Weight:   94 kg   Height:        Intake/Output Summary (Last 24 hours) at 11/07/2018 0916 Last data filed at 11/07/2018 1829 Gross per 24 hour  Intake 480 ml  Output 700 ml  Net -220 ml   Filed Weights   11/05/18 1759 11/06/18 0454 11/07/18 0417  Weight: 94.8 kg 94.5 kg 94 kg    Telemetry    Unable to review - patient seen in Nuc Med - Personally Reviewed  Physical Exam   GEN: Laying on stretcher in no acute distress.   Neck: No JVD, no carotid bruits Cardiac: RRR, no murmurs, rubs, or gallops.  Respiratory: Clear to auscultation bilaterally, no wheezes/ rales/ rhonchi GI: NABS, Soft, nontender, non-distended  MS: No edema; No deformity. Neuro:  Nonfocal, moving all extremities spontaneously Psych: Normal affect   Labs    Chemistry Recent Labs  Lab 11/05/18 1449 11/06/18 0040 11/07/18 0455  NA  140 142 138  K 4.2 3.8 3.8  CL 106 106 104  CO2 24 29 25   GLUCOSE 228* 221* 133*  BUN 11 11 14   CREATININE 1.16 1.06 1.03  CALCIUM 8.7* 8.8* 8.4*  GFRNONAA >60 >60 >60  GFRAA >60 >60 >60  ANIONGAP 10 7 9      Hematology Recent Labs  Lab 11/05/18 1449 11/06/18 0040  WBC 6.6 7.3  RBC 4.47 4.42  HGB 12.8* 12.1*  HCT 40.2 38.9*  MCV 89.9 88.0  MCH 28.6 27.4  MCHC 31.8 31.1  RDW 15.1 14.9  PLT 252 231    Cardiac Enzymes Recent Labs  Lab 11/05/18 2003 11/05/18 2326  TROPONINI <0.03 0.03*    Recent Labs  Lab 11/05/18 1459  TROPIPOC 0.00     BNPNo results for input(s): BNP, PROBNP in the last 168 hours.   DDimer No results for input(s): DDIMER in the last 168 hours.   Radiology    Dg Chest 2 View  Result Date: 11/05/2018 CLINICAL DATA:  Left scapular pain, left shoulder pain and left chest pain. EXAM: CHEST - 2 VIEW COMPARISON:  07/28/2017 FINDINGS: The heart size and mediastinal contours are within normal limits. Atelectasis at both lung bases, right greater than left. There is no evidence of pulmonary edema, consolidation,  pneumothorax, nodule or pleural fluid. The visualized skeletal structures are unremarkable. IMPRESSION: Bibasilar atelectasis.  No acute findings. Electronically Signed   By: Aletta Edouard M.D.   On: 11/05/2018 15:41   Ct Head Wo Contrast  Result Date: 11/05/2018 CLINICAL DATA:  Left arm pain and left chest pain. Previous neck trauma. EXAM: CT HEAD WITHOUT CONTRAST TECHNIQUE: Contiguous axial images were obtained from the base of the skull through the vertex without intravenous contrast. COMPARISON:  None. FINDINGS: Brain: The brain shows a normal appearance without evidence of malformation, atrophy, old or acute small or large vessel infarction, mass lesion, hemorrhage, hydrocephalus or extra-axial collection. Vascular: No hyperdense vessel. No evidence of atherosclerotic calcification. Skull: Normal. No traumatic finding. No focal bone lesion.  Sinuses/Orbits: Sinuses are clear. Orbits appear normal. Mastoids are clear. Other: None significant IMPRESSION: Normal head CT. Electronically Signed   By: Nelson Chimes M.D.   On: 11/05/2018 19:35   Ct Soft Tissue Neck W Contrast  Result Date: 11/05/2018 CLINICAL DATA:  Left-sided arm pain and left-sided chest pain. EXAM: CT NECK WITH CONTRAST TECHNIQUE: Multidetector CT imaging of the neck was performed using the standard protocol following the bolus administration of intravenous contrast. CONTRAST:  32m OMNIPAQUE IOHEXOL 300 MG/ML  SOLN COMPARISON:  07/28/2017 FINDINGS: Pharynx and larynx: Normal Salivary glands: Parotid and submandibular glands are normal. Thyroid: Normal Lymph nodes: No enlarged or low-density nodes on either side of the neck. Vascular: No vascular abnormality seen. This study was not done in the form of a CT angiogram. Limited intracranial: Normal Visualized orbits: Not included Mastoids and visualized paranasal sinuses: Clear Skeleton: Healed fractures at C1, C2 and C3. Previous fracture lines are still visible, but are healed. No acute injury. Chronic bridging anterior osteophytes from C2 through C6. probable diffuse idiopathic skeletal hyperostosis. Upper chest: Negative Other: None IMPRESSION: 1. No soft tissue abnormality seen in the neck. 2. Healed fractures at C1, C2 and C3. Previous fracture lines are still visible, but are healed. 3. The study was not ordered or performed in the form of a CT angiography study. Electronically Signed   By: MNelson ChimesM.D.   On: 11/05/2018 19:33    Cardiac Studies   NST pending  Patient Profile     52y.o. male with a PMH of type 2 diabetes mellitus, Chron's disease, and chronic right shoulder pain, who is being followed by cardiology for the evaluation of chest pain   Assessment & Plan    1. Chest pain/ left shoulder pain: patient presented with chest pain lasting for several hours. EKG without ischemic changes. Trop <0.03 to 0.03.  Patient scheduled for lexiscan to further evaluate for ischemia.  - Will follow-up stress test results - if negative, do not anticipate further ischemic evaluation.   2. HTN: BP poorly controlled despite continuing lisinopril 2.549mdaily and adding metoprolol 12.52m28mID for NSVT on telemetry.  - Will increase lisinopril at this time to 52mg48mily for better BP control  3. HLD: LDL 52 this admission - Continue home lipitor 10mg61mly  4. Tobacco abuse: 10+ year history of tobacco abuse - 1/2-1ppd - Continue to encourage smoking cessation     For questions or updates, please contact [facility] consult [email or web address] for contact info under Cardiology/STEMI.      Signed, KristAbigail ButtsC  11/07/2018, 9:16 AM   [phone] Note:   The patient was seen and examined.  Agree with assessment and plan as noted above.  Changes  made to the above note as needed.  Patient seen and independently examined with Roby Lofts, PA .   We discussed all aspects of the encounter. I agree with the assessment and plan as stated above.  1.  Chest discomfort: The patient has ruled out for myocardial infarction.  The nuclear stress test today did not reveal any ischemia.  It did reveal an ejection fraction of 16%. Does not have any signs or symptoms of severe congestive heart failure so I am questioning whether or not the study was processed properly.  I am having the nuclear medicine department reprocessed the images.  We will will be getting an echocardiogram  To verify    I have spent a total of 40 minutes with patient reviewing hospital  notes , telemetry, EKGs, labs and examining patient as well as establishing an assessment and plan that was discussed with the patient. > 50% of time was spent in direct patient care.    Thayer Headings, Brooke Bonito., MD, [facility] 11/07/2018, 1:26 PM [address]  [place] Pager [phone]

## 2018-11-07 NOTE — Progress Notes (Signed)
   Jared [name] presented for a nuclear stress test today.  No immediate complications.  Stress imaging is pending at this time.  Preliminary EKG findings may be listed in the chart, but the stress test result will not be finalized until perfusion imaging is complete.  Abigail Butts, PA-C 11/07/2018, 9:48 AM

## 2018-11-10 MED FILL — LISINOPRIL 20 MG TAB: 20 | 30 days supply | Qty: 30 | Fill #0

## 2018-11-10 MED FILL — metFORMIN HCL 1000 MG TABS: 1000 | 30 days supply | Qty: 60 | Fill #2

## 2018-11-10 MED FILL — ATORVASTATIN 10 MG TABLET: 10 | 30 days supply | Qty: 30 | Fill #2

## 2018-11-19 ENCOUNTER — Emergency Department (HOSPITAL_COMMUNITY)
Admission: EM | Admit: 2018-11-19 | Discharge: 2018-11-19 | Disposition: A | Discharge status: Home / Self Care | Payer: Medicaid Other | Attending: Emergency Medicine | Admitting: Emergency Medicine

## 2018-11-19 ENCOUNTER — Emergency Department (HOSPITAL_COMMUNITY): Payer: Medicaid Other

## 2018-11-19 ENCOUNTER — Other Ambulatory Visit: Payer: Self-pay

## 2018-11-19 ENCOUNTER — Encounter (HOSPITAL_COMMUNITY): Payer: Self-pay | Admitting: *Deleted

## 2018-11-19 DIAGNOSIS — Z79899 Other long term (current) drug therapy: Secondary | ICD-10-CM | POA: Diagnosis not present

## 2018-11-19 DIAGNOSIS — F17210 Nicotine dependence, cigarettes, uncomplicated: Secondary | ICD-10-CM | POA: Diagnosis not present

## 2018-11-19 DIAGNOSIS — R0789 Other chest pain: Secondary | ICD-10-CM | POA: Diagnosis not present

## 2018-11-19 DIAGNOSIS — Z794 Long term (current) use of insulin: Secondary | ICD-10-CM | POA: Insufficient documentation

## 2018-11-19 DIAGNOSIS — E119 Type 2 diabetes mellitus without complications: Secondary | ICD-10-CM | POA: Insufficient documentation

## 2018-11-19 DIAGNOSIS — R079 Chest pain, unspecified: Secondary | ICD-10-CM | POA: Diagnosis present

## 2018-11-19 DIAGNOSIS — I10 Essential (primary) hypertension: Secondary | ICD-10-CM | POA: Diagnosis not present

## 2018-11-19 HISTORY — DX: Essential (primary) hypertension: I10

## 2018-11-19 LAB — BASIC METABOLIC PANEL
Anion gap: 11 (ref 5–15)
BUN: 12 mg/dL (ref 6–20)
CO2: 24 mmol/L (ref 22–32)
Calcium: 8.7 mg/dL — ABNORMAL LOW (ref 8.9–10.3)
Chloride: 106 mmol/L (ref 98–111)
Creatinine, Ser: 0.83 mg/dL (ref 0.61–1.24)
GFR calc Af Amer: >60 mL/min (ref >60)
GFR calc non Af Amer: >60 mL/min (ref >60)
Glucose, Bld: 112 mg/dL — ABNORMAL HIGH (ref 70–99)
Potassium: 3.5 mmol/L (ref 3.5–5.1)
Sodium: 141 mmol/L (ref 135–145)

## 2018-11-19 LAB — CBC
HCT: 41.3 % (ref 39.0–52.0)
Hemoglobin: 13.1 g/dL (ref 13.0–17.0)
MCH: 27.6 pg (ref 26.0–34.0)
MCHC: 31.7 g/dL (ref 30.0–36.0)
MCV: 87.1 fL (ref 80.0–100.0)
Platelets: 309 10*3/uL (ref 150–400)
RBC: 4.74 MIL/uL (ref 4.22–5.81)
RDW: 14.3 % (ref 11.5–15.5)
WBC: 9.5 10*3/uL (ref 4.0–10.5)
nRBC: 0 % (ref 0.0–0.2)

## 2018-11-19 LAB — POCT I-STAT TROPONIN I: Troponin i, poc: 0.02 ng/mL (ref 0.00–0.08)

## 2018-11-19 MED ORDER — ALUM & MAG HYDROXIDE-SIMETH 200-200-20 MG/5ML PO SUSP
15.0000 mL | Freq: Once | ORAL | Status: AC
  Administered 2018-11-19: 15 mL via ORAL
  Filled 2018-11-19: qty 30
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
## 2018-11-19 NOTE — Discharge Instructions (Signed)
Try zantac or pepcid twice a day.  Try to avoid things that may make this worse, most commonly these are spicy foods tomato based products fatty foods chocolate and peppermint.  Alcohol and tobacco can also make this worse.  Return to the emergency department for sudden worsening pain fever or inability to eat or drink. ° °

## 2018-11-19 NOTE — ED Triage Notes (Signed)
Pt stated "I had chest pain that started around 2200, then I went walking for about 2 hours.  I had a echo and stress test about 2 weeks ago."  Pt denies n/v but did have some dizziness & SOB.

## 2018-11-23 ENCOUNTER — Telehealth: Payer: Self-pay | Admitting: Family Medicine

## 2018-11-23 ENCOUNTER — Encounter: Payer: Self-pay | Admitting: Family Medicine

## 2018-11-23 ENCOUNTER — Ambulatory Visit (INDEPENDENT_AMBULATORY_CARE_PROVIDER_SITE_OTHER): Payer: Self-pay | Admitting: Family Medicine

## 2018-11-23 VITALS — BP 156/76 | HR 87 | Temp 98.6°F | Resp 16 | Ht 74.0 in | Wt 212.0 lb

## 2018-11-23 DIAGNOSIS — G4700 Insomnia, unspecified: Secondary | ICD-10-CM

## 2018-11-23 DIAGNOSIS — I10 Essential (primary) hypertension: Secondary | ICD-10-CM

## 2018-11-23 DIAGNOSIS — E1140 Type 2 diabetes mellitus with diabetic neuropathy, unspecified: Secondary | ICD-10-CM

## 2018-11-23 DIAGNOSIS — F419 Anxiety disorder, unspecified: Secondary | ICD-10-CM

## 2018-11-23 LAB — POCT URINALYSIS DIPSTICK
Bilirubin, UA: NEGATIVE
Glucose, UA: POSITIVE — AB
Leukocytes, UA: NEGATIVE
Nitrite, UA: NEGATIVE
Protein, UA: POSITIVE — AB
Spec Grav, UA: 1.025 (ref 1.010–1.025)
Urobilinogen, UA: 0.2 E.U./dL
pH, UA: 6 (ref 5.0–8.0)

## 2018-11-23 MED ORDER — TRAZODONE HCL 50 MG PO TABS: 50.0000 mg | ORAL_TABLET | Freq: Every evening | ORAL | 3 refills | Status: DC | PRN

## 2018-11-23 MED ORDER — HYDROXYZINE HCL 10 MG PO TABS: 10.0000 mg | ORAL_TABLET | Freq: Three times a day (TID) | ORAL | 0 refills | Status: DC | PRN

## 2018-11-23 MED ORDER — SAXAGLIPTIN HCL 2.5 MG PO TABS: 2.5000 mg | ORAL_TABLET | Freq: Every day | ORAL | 5 refills | Status: DC

## 2018-11-23 MED FILL — !ONGLYZA 2.5 MG TAB: 2.5 | 30 days supply | Qty: 30 | Fill #0

## 2018-11-23 MED FILL — traZODone HCL 50 MG TABS: 50 | 30 days supply | Qty: 30 | Fill #0

## 2018-11-23 MED FILL — hydrOXYzine HCL 10 MG TABS: 10 | 30 days supply | Qty: 90 | Fill #0

## 2018-11-23 NOTE — Telephone Encounter (Signed)
Called and spoke with patient, advised that we are changing him to [place] and to stop [place].

## 2018-11-23 NOTE — Patient Instructions (Signed)
Hydroxyzine capsules or tablets What is this medicine? HYDROXYZINE (hye [place] i zeen) is an antihistamine. This medicine is used to treat allergy symptoms. It is also used to treat anxiety and tension. This medicine can be used with other medicines to induce sleep before surgery. This medicine may be used for other purposes; ask your health care provider or pharmacist if you have questions. COMMON BRAND NAME(S): ANX, Atarax, Rezine, Vistaril What should I tell my health care provider before I take this medicine? They need to know if you have any of these conditions: -glaucoma -heart disease -history of irregular heartbeat -kidney disease -liver disease -lung or breathing disease, like asthma -stomach or intestine problems -thyroid disease -trouble passing urine -an unusual or allergic reaction to hydroxyzine, cetirizine, other medicines, foods, dyes or preservatives -pregnant or trying to get pregnant -breast-feeding How should I use this medicine? Take this medicine by mouth with a full glass of water. Follow the directions on the prescription label. You may take this medicine with food or on an empty stomach. Take your medicine at regular intervals. Do not take your medicine more often than directed. Talk to your pediatrician regarding the use of this medicine in children. Special care may be needed. While this drug may be prescribed for children as young as 12 years of age for selected conditions, precautions do apply. Patients over 34 years old may have a stronger reaction and need a smaller dose. Overdosage: If you think you have taken too much of this medicine contact a poison control center or emergency room at once. NOTE: This medicine is only for you. Do not share this medicine with others. What if I miss a dose? If you miss a dose, take it as soon as you can. If it is almost time for your next dose, take only that dose. Do not take double or extra doses. What may interact with this  medicine? Do not take this medicine with any of the following medications: -cisapride -dofetilide -dronedarone -pimozide -thioridazine This medicine may also interact with the following medications: -alcohol -antihistamines for allergy, cough, and cold -atropine -barbiturate medicines for sleep or seizures, like phenobarbital -certain antibiotics like erythromycin or clarithromycin -certain medicines for anxiety or sleep -certain medicines for bladder problems like oxybutynin, tolterodine -certain medicines for depression or psychotic disturbances -certain medicines for irregular heart beat -certain medicines for Parkinson's disease like benztropine, trihexyphenidyl -certain medicines for seizures like phenobarbital, primidone -certain medicines for stomach problems like dicyclomine, hyoscyamine -certain medicines for travel sickness like scopolamine -ipratropium -narcotic medicines for pain -other medicines that prolong the QT interval (an abnormal heart rhythm) This list may not describe all possible interactions. Give your health care provider a list of all the medicines, herbs, non-prescription drugs, or dietary supplements you use. Also tell them if you smoke, drink alcohol, or use illegal drugs. Some items may interact with your medicine. What should I watch for while using this medicine? Tell your doctor or health care professional if your symptoms do not improve. You may get drowsy or dizzy. Do not drive, use machinery, or do anything that needs mental alertness until you know how this medicine affects you. Do not stand or sit up quickly, especially if you are an older patient. This reduces the risk of dizzy or fainting spells. Alcohol may interfere with the effect of this medicine. Avoid alcoholic drinks. Your mouth may get dry. Chewing sugarless gum or sucking hard candy, and drinking plenty of water may help. Contact your doctor if  the problem does not go away or is  severe. This medicine may cause dry eyes and blurred vision. If you wear contact lenses you may feel some discomfort. Lubricating drops may help. See your eye doctor if the problem does not go away or is severe. If you are receiving skin tests for allergies, tell your doctor you are using this medicine. What side effects may I notice from receiving this medicine? Side effects that you should report to your doctor or health care professional as soon as possible: -allergic reactions like skin rash, itching or hives, swelling of the face, lips, or tongue -changes in vision -confusion -fast, irregular heartbeat -seizures -tremor -trouble passing urine or change in the amount of urine Side effects that usually do not require medical attention (report to your doctor or health care professional if they continue or are bothersome): -constipation -drowsiness -dry mouth -headache -tiredness This list may not describe all possible side effects. Call your doctor for medical advice about side effects. You may report side effects to FDA at 1-800-FDA-1088. Where should I keep my medicine? Keep out of the reach of children. Store at room temperature between 15 and 30 degrees C (59 and 86 degrees F). Keep container tightly closed. Throw away any unused medicine after the expiration date. NOTE: This sheet is a summary. It may not cover all possible information. If you have questions about this medicine, talk to your doctor, pharmacist, or health care provider.  2019 Elsevier/Gold Standard (2018-05-15 13:25:13) Trazodone tablets What is this medicine? TRAZODONE (TRAZ oh done) is used to treat depression. This medicine may be used for other purposes; ask your health care provider or pharmacist if you have questions. COMMON BRAND NAME(S): Desyrel What should I tell my health care provider before I take this medicine? They need to know if you have any of these conditions: -attempted suicide or thinking  about it -bipolar disorder -bleeding problems -glaucoma -heart disease, or previous heart attack -irregular heart beat -kidney or liver disease -low levels of sodium in the blood -an unusual or allergic reaction to trazodone, other medicines, foods, dyes or preservatives -pregnant or trying to get pregnant -breast-feeding How should I use this medicine? Take this medicine by mouth with a glass of water. Follow the directions on the prescription label. Take this medicine shortly after a meal or a light snack. Take your medicine at regular intervals. Do not take your medicine more often than directed. Do not stop taking this medicine suddenly except upon the advice of your doctor. Stopping this medicine too quickly may cause serious side effects or your condition may worsen. A special MedGuide will be given to you by the pharmacist with each prescription and refill. Be sure to read this information carefully each time. Talk to your pediatrician regarding the use of this medicine in children. Special care may be needed. Overdosage: If you think you have taken too much of this medicine contact a poison control center or emergency room at once. NOTE: This medicine is only for you. Do not share this medicine with others. What if I miss a dose? If you miss a dose, take it as soon as you can. If it is almost time for your next dose, take only that dose. Do not take double or extra doses. What may interact with this medicine? Do not take this medicine with any of the following medications: -certain medicines for fungal infections like fluconazole, itraconazole, ketoconazole, posaconazole, voriconazole -cisapride -dofetilide -dronedarone -linezolid -MAOIs like Carbex, Eldepryl,  Marplan, Nardil, and Parnate -mesoridazine -methylene blue (injected into a vein) -pimozide -saquinavir -thioridazine This medicine may also interact with the following medications: -alcohol -antiviral medicines for  HIV or AIDS -aspirin and aspirin-like medicines -barbiturates like phenobarbital -certain medicines for blood pressure, heart disease, irregular heart beat -certain medicines for depression, anxiety, or psychotic disturbances -certain medicines for migraine headache like almotriptan, eletriptan, frovatriptan, naratriptan, rizatriptan, sumatriptan, zolmitriptan -certain medicines for seizures like carbamazepine and phenytoin -certain medicines for sleep -certain medicines that treat or prevent blood clots like dalteparin, enoxaparin, warfarin -digoxin -fentanyl -lithium -NSAIDS, medicines for pain and inflammation, like ibuprofen or naproxen -other medicines that prolong the QT interval (cause an abnormal heart rhythm) -rasagiline -supplements like St. John's wort, kava kava, valerian -tramadol -tryptophan This list may not describe all possible interactions. Give your health care provider a list of all the medicines, herbs, non-prescription drugs, or dietary supplements you use. Also tell them if you smoke, drink alcohol, or use illegal drugs. Some items may interact with your medicine. What should I watch for while using this medicine? Tell your doctor if your symptoms do not get better or if they get worse. Visit your doctor or health care professional for regular checks on your progress. Because it may take several weeks to see the full effects of this medicine, it is important to continue your treatment as prescribed by your doctor. Patients and their families should watch out for new or worsening thoughts of suicide or depression. Also watch out for sudden changes in feelings such as feeling anxious, agitated, panicky, irritable, hostile, aggressive, impulsive, severely restless, overly excited and hyperactive, or not being able to sleep. If this happens, especially at the beginning of treatment or after a change in dose, call your health care professional. Dennis Bast may get drowsy or dizzy. Do  not drive, use machinery, or do anything that needs mental alertness until you know how this medicine affects you. Do not stand or sit up quickly, especially if you are an older patient. This reduces the risk of dizzy or fainting spells. Alcohol may interfere with the effect of this medicine. Avoid alcoholic drinks. This medicine may cause dry eyes and blurred vision. If you wear contact lenses you may feel some discomfort. Lubricating drops may help. See your eye doctor if the problem does not go away or is severe. Your mouth may get dry. Chewing sugarless gum, sucking hard candy and drinking plenty of water may help. Contact your doctor if the problem does not go away or is severe. What side effects may I notice from receiving this medicine? Side effects that you should report to your doctor or health care professional as soon as possible: -allergic reactions like skin rash, itching or hives, swelling of the face, lips, or tongue -elevated mood, decreased need for sleep, racing thoughts, impulsive behavior -confusion -fast, irregular heartbeat -feeling faint or lightheaded, falls -feeling agitated, angry, or irritable -loss of balance or coordination -painful or prolonged erections -restlessness, pacing, inability to keep still -suicidal thoughts or other mood changes -tremors -trouble sleeping -seizures -unusual bleeding or bruising Side effects that usually do not require medical attention (report to your doctor or health care professional if they continue or are bothersome): -change in sex drive or performance -change in appetite or weight -constipation -headache -muscle aches or pains -nausea This list may not describe all possible side effects. Call your doctor for medical advice about side effects. You may report side effects to FDA at 1-800-FDA-1088. Where should I  keep my medicine? Keep out of the reach of children. Store at room temperature between 15 and 30 degrees C (59 to  86 degrees F). Protect from light. Keep container tightly closed. Throw away any unused medicine after the expiration date. NOTE: This sheet is a summary. It may not cover all possible information. If you have questions about this medicine, talk to your doctor, pharmacist, or health care provider.  2019 Elsevier/Gold Standard (2018-01-10 17:51:24)

## 2018-11-23 NOTE — Progress Notes (Signed)
[name]   Progress Note: General Provider: Lanae Boast, FNP  SUBJECTIVE:   Jared [name] is a 53 y.o. male who  has a past medical history of Crohn disease ([place]) (2008), Diabetes mellitus type II, and Hypertension.. Patient presents today for Hypertension; Diabetes; and Anxiety (can't sleep )  Patient states that he was given insulin while in the hospital. Patient is not checking his FBS at home. States that he has not been taking [place] due to not having it. Patient states that he is having anxiety and difficulty with sleeping x 1 month or more.    Review of Systems  Constitutional: Negative.   HENT: Negative.   Eyes: Negative.   Respiratory: Negative.   Cardiovascular: Negative.   Gastrointestinal: Negative.   Genitourinary: Negative.   Musculoskeletal: Negative.   Skin: Negative.   Neurological: Negative.   Psychiatric/Behavioral: Negative for depression, substance abuse and suicidal ideas. The patient is nervous/anxious and has insomnia.      OBJECTIVE: BP (!) 156/76 (BP Location: Left Arm, Patient Position: Sitting, Cuff Size: Large)   Pulse 87   Temp 98.6 F (37 C) (Oral)   Resp 16   Ht 6' 2"  (1.88 m)   Wt 212 lb (96.2 kg)   SpO2 100%   BMI 27.22 kg/m   Wt Readings from Last 3 Encounters:  11/23/18 212 lb (96.2 kg)  11/19/18 197 lb 8 oz (89.6 kg)  11/07/18 207 lb 3.2 oz (94 kg)     Physical Exam Vitals signs and nursing note reviewed.  Constitutional:      General: He is not in acute distress.    Appearance: He is well-developed.  HENT:     Head: Normocephalic and atraumatic.  Eyes:     Conjunctiva/sclera: Conjunctivae normal.     Pupils: Pupils are equal, round, and reactive to light.  Neck:     Musculoskeletal: Normal range of motion.  Cardiovascular:     Rate and Rhythm: Normal rate and regular rhythm.     Heart sounds: Normal heart sounds.  Pulmonary:     Effort: Pulmonary effort is normal.  No respiratory distress.     Breath sounds: Normal breath sounds.  Abdominal:     General: Bowel sounds are normal. There is no distension.     Palpations: Abdomen is soft.  Musculoskeletal: Normal range of motion.  Skin:    General: Skin is warm and dry.  Neurological:     Mental Status: He is alert and oriented to person, place, and time.  Psychiatric:        Behavior: Behavior normal.        Thought Content: Thought content normal.     ASSESSMENT/PLAN:  1. Essential hypertension Will continue with current medications. Patient to monitor BP at home and bring log to office. Patient states increase due to anxiety and lack of sleep. Will continue to monitor.  - Urinalysis Dipstick  2. Anxiety Trial of vistaril for anxiety. Discussed benefits and risks of medications.  - hydrOXYzine (ATARAX/VISTARIL) 10 MG tablet; Take 1 tablet (10 mg total) by mouth 3 (three) times daily as needed for anxiety.  Dispense: 90 tablet; Refill: 0  3. Insomnia, unspecified type Sleep hygiene recommended.  - traZODone (DESYREL) 50 MG tablet; Take 1 tablet (50 mg total) by mouth at bedtime as needed for sleep.  Dispense: 30 tablet; Refill: 3   4. Type 2 diabetes mellitus with diabetic neuropathy, without long-term current use of insulin (  [place]) Stop [place]. Start [place]. A1c has increased to 7.2. will continue to monitor.  - saxagliptin HCl (ONGLYZA) 2.5 MG TABS tablet; Take 1 tablet (2.5 mg total) by mouth daily.  Dispense: 30 tablet; Refill: 5    Return in about 4 weeks (around 12/21/2018) for insomnia and anxiety.    The patient was given clear instructions to go to ER or return to medical center if symptoms do not improve, worsen or new problems develop. The patient verbalized understanding and agreed with plan of care.   Ms. Doug Sou. Nathaneil Canary, FNP-BC [facility] [address] [place],  [address] [phone]

## 2018-11-29 ENCOUNTER — Ambulatory Visit: Payer: Self-pay | Admitting: Gastroenterology

## 2018-12-11 ENCOUNTER — Other Ambulatory Visit: Payer: Self-pay | Admitting: Family Medicine

## 2018-12-11 DIAGNOSIS — M25511 Pain in right shoulder: Secondary | ICD-10-CM

## 2018-12-11 MED FILL — ATORVASTATIN 10 MG TABLET: 10 | 30 days supply | Qty: 30 | Fill #3

## 2018-12-11 MED FILL — metFORMIN HCL 1000 MG TABS: 1000 | 30 days supply | Qty: 60 | Fill #3

## 2018-12-12 MED FILL — IBUPROFEN 800 MG TABLET: 800 | 20 days supply | Qty: 30 | Fill #0

## 2018-12-14 ENCOUNTER — Other Ambulatory Visit: Payer: Self-pay

## 2018-12-14 MED ORDER — LISINOPRIL 20 MG PO TABS: 20.0000 mg | ORAL_TABLET | Freq: Every day | ORAL | 3 refills | Status: DC

## 2018-12-14 MED ORDER — LISINOPRIL 20 MG PO TABS: 20.0000 mg | ORAL_TABLET | Freq: Every day | ORAL | 0 refills | Status: DC

## 2018-12-14 MED FILL — LISINOPRIL 20 MG TAB: 20 | 30 days supply | Qty: 30 | Fill #0

## 2018-12-26 MED FILL — !ONGLYZA 2.5 MG TAB: 2.5 | 30 days supply | Qty: 30 | Fill #1

## 2018-12-28 ENCOUNTER — Other Ambulatory Visit (INDEPENDENT_AMBULATORY_CARE_PROVIDER_SITE_OTHER): Payer: Self-pay

## 2018-12-28 ENCOUNTER — Encounter (INDEPENDENT_AMBULATORY_CARE_PROVIDER_SITE_OTHER): Payer: Self-pay

## 2018-12-28 ENCOUNTER — Encounter: Payer: Self-pay | Admitting: Gastroenterology

## 2018-12-28 ENCOUNTER — Ambulatory Visit (INDEPENDENT_AMBULATORY_CARE_PROVIDER_SITE_OTHER): Payer: Self-pay | Admitting: Gastroenterology

## 2018-12-28 VITALS — BP 136/66 | HR 84 | Ht 74.0 in | Wt 215.4 lb

## 2018-12-28 DIAGNOSIS — R1084 Generalized abdominal pain: Secondary | ICD-10-CM

## 2018-12-28 DIAGNOSIS — K50919 Crohn's disease, unspecified, with unspecified complications: Secondary | ICD-10-CM

## 2018-12-28 LAB — C-REACTIVE PROTEIN: CRP: 1.2 mg/dL (ref 0.5–20.0)

## 2018-12-28 LAB — SEDIMENTATION RATE: Sed Rate: 52 mm/hr — ABNORMAL HIGH (ref 0–20)

## 2018-12-28 MED ORDER — NA SULFATE-K SULFATE-MG SULF 17.5-3.13-1.6 GM/177ML PO SOLN: 1.0000 | ORAL | 0 refills | Status: DC

## 2018-12-28 NOTE — Patient Instructions (Addendum)
Your provider has requested that you go to the basement level for lab work before leaving today. Press "B" on the elevator. The lab is located at the first door on the left as you exit the elevator.  Avoid NSAIDs.   You have been scheduled for a CT scan of the abdomen and pelvis at [facility] ([address]---this is in the same building as [facility]).   You are scheduled on 01-02-2019 at 2:30 pm. You should arrive at 1:00 pm to drink contrast. You should arrive 15 minutes prior to your appointment time for registration. Please follow the written instructions below on the day of your exam:  WARNING: IF YOU ARE ALLERGIC TO IODINE/X-RAY DYE, PLEASE NOTIFY RADIOLOGY IMMEDIATELY AT [phone]! YOU WILL BE GIVEN A 13 HOUR PREMEDICATION PREP.  Do not eat or drink anything after 10:30 am (4 hours prior to your test)   You may take any medications as prescribed with a small amount of water, if necessary. If you take any of the following medications: METFORMIN, GLUCOPHAGE, GLUCOVANCE, AVANDAMET, RIOMET, FORTAMET, [place] MET, JANUMET, GLUMETZA or METAGLIP, you MAY be asked to HOLD this medication 48 hours AFTER the exam.  The purpose of you drinking the oral contrast is to aid in the visualization of your intestinal tract. The contrast solution may cause some diarrhea. Depending on your individual set of symptoms, you may also receive an intravenous injection of x-ray contrast/dye. Plan on being at [facility] for 30 minutes or longer, depending on the type of exam you are having performed.  This test typically takes 30-45 minutes to complete.  If you have any questions regarding your exam or if you need to reschedule, you may call the CT department at [phone] between the hours of 8:00 am and 5:00 pm, Monday-Friday.  ________________________________________________________________________  Dennis Bast have been scheduled for an endoscopy and colonoscopy. Please follow the  written instructions given to you at your visit today. Please pick up your prep supplies at the pharmacy within the next 1-3 days. If you use inhalers (even only as needed), please bring them with you on the day of your procedure. Your physician has requested that you go to www.startemmi.com and enter the access code given to you at your visit today. This web site gives a general overview about your procedure. However, you should still follow specific instructions given to you by our office regarding your preparation for the procedure.
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
## 2018-12-29 ENCOUNTER — Other Ambulatory Visit: Payer: Medicaid Other

## 2018-12-29 DIAGNOSIS — K50919 Crohn's disease, unspecified, with unspecified complications: Secondary | ICD-10-CM

## 2019-01-01 ENCOUNTER — Other Ambulatory Visit: Payer: Self-pay | Admitting: Emergency Medicine

## 2019-01-01 DIAGNOSIS — K50919 Crohn's disease, unspecified, with unspecified complications: Secondary | ICD-10-CM

## 2019-01-02 ENCOUNTER — Encounter (HOSPITAL_COMMUNITY): Payer: Self-pay

## 2019-01-02 ENCOUNTER — Ambulatory Visit (HOSPITAL_COMMUNITY)
Admission: RE | Admit: 2019-01-02 | Discharge: 2019-01-02 | Disposition: A | Discharge status: Home / Self Care | Payer: Medicaid Other | Source: Ambulatory Visit | Attending: Gastroenterology | Admitting: Gastroenterology

## 2019-01-02 DIAGNOSIS — K50919 Crohn's disease, unspecified, with unspecified complications: Secondary | ICD-10-CM | POA: Insufficient documentation

## 2019-01-02 LAB — POCT I-STAT CREATININE: Creatinine, Ser: 1 mg/dL (ref 0.61–1.24)

## 2019-01-02 MED ORDER — BARIUM SULFATE 0.1 % PO SUSP
450.0000 mL | Freq: Once | ORAL | Status: AC
  Administered 2019-01-02: 450 mL via ORAL

## 2019-01-02 MED ORDER — BARIUM SULFATE 0.1 % PO SUSP
ORAL | Status: AC
  Filled 2019-01-02: qty 3

## 2019-01-02 MED ORDER — SODIUM CHLORIDE (PF) 0.9 % IJ SOLN
INTRAMUSCULAR | Status: AC
  Filled 2019-01-02: qty 50

## 2019-01-02 MED ORDER — IOHEXOL 300 MG/ML  SOLN
100.0000 mL | Freq: Once | INTRAMUSCULAR | Status: AC | PRN
  Administered 2019-01-02: 100 mL via INTRAVENOUS

## 2019-01-03 LAB — CALPROTECTIN, FECAL: Calprotectin, Fecal: 282 ug/g — ABNORMAL HIGH (ref 0–120)

## 2019-01-08 MED FILL — LISINOPRIL 20 MG TAB: 20 | 30 days supply | Qty: 30 | Fill #1

## 2019-01-08 MED FILL — IBUPROFEN 800 MG TABLET: 800 | 15 days supply | Qty: 30 | Fill #1

## 2019-01-08 MED FILL — ATORVASTATIN 10 MG TABLET: 10 | 30 days supply | Qty: 30 | Fill #4

## 2019-01-10 MED FILL — metFORMIN HCL 1000 MG TABS: 1000 | 30 days supply | Qty: 60 | Fill #4

## 2019-01-12 ENCOUNTER — Other Ambulatory Visit: Payer: Self-pay | Admitting: Gastroenterology

## 2019-01-12 MED ORDER — NA SULFATE-K SULFATE-MG SULF 17.5-3.13-1.6 GM/177ML PO SOLN: 1.0000 | ORAL | 0 refills | Status: DC

## 2019-01-12 MED FILL — SUPREP BOWEL PREP KIT: 17.5-3.13-1 | 1 days supply | Qty: 354 | Fill #0

## 2019-01-12 NOTE — Telephone Encounter (Signed)
Prescription sent to patient's pharmacy. Patient notified and verbalized understanding.

## 2019-01-12 NOTE — Telephone Encounter (Signed)
Pt is sched 3.4 colon.  Pt requested prescription for prep soln to be resent to [facility].  They claim they never received it.

## 2019-01-17 ENCOUNTER — Ambulatory Visit (AMBULATORY_SURGERY_CENTER): Payer: Self-pay | Admitting: Gastroenterology

## 2019-01-17 ENCOUNTER — Encounter: Payer: Self-pay | Admitting: Gastroenterology

## 2019-01-17 ENCOUNTER — Telehealth: Payer: Self-pay

## 2019-01-17 VITALS — BP 144/87 | HR 73 | Temp 97.3°F | Resp 9 | Ht 74.0 in | Wt 215.0 lb

## 2019-01-17 DIAGNOSIS — K50919 Crohn's disease, unspecified, with unspecified complications: Secondary | ICD-10-CM

## 2019-01-17 DIAGNOSIS — K2980 Duodenitis without bleeding: Secondary | ICD-10-CM

## 2019-01-17 DIAGNOSIS — K2970 Gastritis, unspecified, without bleeding: Secondary | ICD-10-CM

## 2019-01-17 DIAGNOSIS — R1084 Generalized abdominal pain: Secondary | ICD-10-CM

## 2019-01-17 MED ORDER — SODIUM CHLORIDE 0.9 % IV SOLN: 500.0000 mL | Freq: Once | INTRAVENOUS | Status: DC

## 2019-01-17 NOTE — Op Note (Signed)
[facility] Patient Name: Donevan Biller Procedure Date: 01/17/2019 2:09 PM MRN: [id number] Endoscopist: Thornton Park MD, MD Age: 53 Referring MD:  Date of Birth: 1965-12-21 Gender: Male Account #: [id number] Procedure:                Upper GI endoscopy Indications:              Generalized abdominal pain, Crohn's disease Medicines:                See the Anesthesia note for documentation of the                            administered medications Procedure:                Pre-Anesthesia Assessment:                           - Prior to the procedure, a History and Physical                            was performed, and patient medications and                            allergies were reviewed. The patient's tolerance of                            previous anesthesia was also reviewed. The risks                            and benefits of the procedure and the sedation                            options and risks were discussed with the patient.                            All questions were answered, and informed consent                            was obtained. Prior Anticoagulants: The patient has                            taken no previous anticoagulant or antiplatelet                            agents. ASA Grade Assessment: III - A patient with                            severe systemic disease. After reviewing the risks                            and benefits, the patient was deemed in                            satisfactory condition to undergo the procedure.  After obtaining informed consent, the endoscope was                            passed under direct vision. Throughout the                            procedure, the patient's blood pressure, pulse, and                            oxygen saturations were monitored continuously. The                            Endoscope was introduced through the mouth, and                            advanced to  the second part of duodenum. The upper                            GI endoscopy was accomplished without difficulty.                            The patient tolerated the procedure well. Scope In: Scope Out: Findings:                 The esophagus was normal.                           Multiple localized, small non-bleeding erosions                            were found in the gastric body. There were no                            stigmata of recent bleeding. Biopsies were taken                            with a cold forceps for histology. Estimated blood                            loss was minimal.                           Diffuse nodular mucosa was found in the duodenal                            bulb. Biopsies were taken with a cold forceps for                            histology. Estimated blood loss was minimal.                           The exam was otherwise without abnormality. Complications:            No immediate complications. Estimated blood loss:  Minimal. Estimated Blood Loss:     Estimated blood loss was minimal. Impression:               - Normal esophagus.                           - Non-bleeding erosive gastropathy. Biopsied.                           - Nodular mucosa in the duodenal bulb. Biopsied.                           - The examination was otherwise normal. Recommendation:           - Patient has a contact number available for                            emergencies. The signs and symptoms of potential                            delayed complications were discussed with the                            patient. Return to normal activities tomorrow.                            Written discharge instructions were provided to the                            patient.                           - Resume regular diet today.                           - Continue present medications.                           - Await pathology results.                            - No repeat upper endoscopy planned at this time.                           - Proceed with colonoscopy as previously planned.                           - Return to GI office - next available after the                            pathology results are available - to review these                            results. Thornton Park MD, MD 01/17/2019 2:52:48 PM This report has been signed electronically.

## 2019-01-17 NOTE — Progress Notes (Signed)
Pt awake. VSS report given to RN. No anesthetic complications noted

## 2019-01-17 NOTE — Patient Instructions (Signed)
YOU HAD AN ENDOSCOPIC PROCEDURE TODAY AT [facility]:   Refer to the procedure report that was given to you for any specific questions about what was found during the examination.  If the procedure report does not answer your questions, please call your gastroenterologist to clarify.  If you requested that your care partner not be given the details of your procedure findings, then the procedure report has been included in a sealed envelope for you to review at your convenience later.  YOU SHOULD EXPECT: Some feelings of bloating in the abdomen. Passage of more gas than usual.  Walking can help get rid of the air that was put into your GI tract during the procedure and reduce the bloating. If you had a lower endoscopy (such as a colonoscopy or flexible sigmoidoscopy) you may notice spotting of blood in your stool or on the toilet paper. If you underwent a bowel prep for your procedure, you may not have a normal bowel movement for a few days.  Please Note:  You might notice some irritation and congestion in your nose or some drainage.  This is from the oxygen used during your procedure.  There is no need for concern and it should clear up in a day or so.  SYMPTOMS TO REPORT IMMEDIATELY:   Following lower endoscopy (colonoscopy or flexible sigmoidoscopy):  Excessive amounts of blood in the stool  Significant tenderness or worsening of abdominal pains  Swelling of the abdomen that is new, acute  Fever of 100F or higher  Please see handout given to you on Gastritis.  For urgent or emergent issues, a gastroenterologist can be reached at any hour by calling [phone].   DIET:  We do recommend a small meal at first, but then you may proceed to your regular diet.  Drink plenty of fluids but you should avoid alcoholic beverages for 24 hours.  ACTIVITY:  You should plan to take it easy for the rest of today and you should NOT DRIVE or use heavy machinery until tomorrow (because of  the sedation medicines used during the test).    FOLLOW UP: Our staff will call the number listed on your records the next business day following your procedure to check on you and address any questions or concerns that you may have regarding the information given to you following your procedure. If we do not reach you, we will leave a message.  However, if you are feeling well and you are not experiencing any problems, there is no need to return our call.  We will assume that you have returned to your regular daily activities without incident.  If any biopsies were taken you will be contacted by phone or by letter within the next 1-3 weeks.  Please call us at [phone] if you have not heard about the biopsies in 3 weeks.    SIGNATURES/CONFIDENTIALITY: You and/or your care partner have signed paperwork which will be entered into your electronic medical record.  These signatures attest to the fact that that the information above on your After Visit Summary has been reviewed and is understood.  Full responsibility of the confidentiality of this discharge information lies with you and/or your care-partner.  Thank you for letting us take care of your healthcare needs today.

## 2019-01-17 NOTE — Op Note (Signed)
[facility] Patient Name: Jared [name] Procedure Date: 01/17/2019 2:09 PM MRN: [id number] Endoscopist: Thornton Park MD, MD Age: 53 Referring MD:  Date of Birth: 1965-11-26 Gender: Male Account #: [id number] Procedure:                Colonoscopy Indications:              Generalized abdominal pain, Follow-up of Crohn's                            disease of the small bowel and colon Medicines:                See the Anesthesia note for documentation of the                            administered medications Procedure:                Pre-Anesthesia Assessment:                           - Prior to the procedure, a History and Physical                            was performed, and patient medications and                            allergies were reviewed. The patient's tolerance of                            previous anesthesia was also reviewed. The risks                            and benefits of the procedure and the sedation                            options and risks were discussed with the patient.                            All questions were answered, and informed consent                            was obtained. Prior Anticoagulants: The patient has                            taken no previous anticoagulant or antiplatelet                            agents. ASA Grade Assessment: II - A patient with                            mild systemic disease. After reviewing the risks                            and benefits, the patient was deemed in  satisfactory condition to undergo the procedure.                           After obtaining informed consent, the colonoscope                            was passed under direct vision. Throughout the                            procedure, the patient's blood pressure, pulse, and                            oxygen saturations were monitored continuously. The                            Colonoscope was introduced  through the anus and                            advanced to the the ileocolonic anastomosis. The                            colonoscopy was performed with moderate difficulty                            due to a redundant colon and a tortuous colon.                            Successful completion of the procedure was aided by                            applying abdominal pressure. The patient tolerated                            the procedure well. The quality of the bowel                            preparation was good. The terminal ileum and the                            rectum were photographed. Scope In: 2:30:02 PM Scope Out: 2:45:16 PM Scope Withdrawal Time: 0 hours 11 minutes 39 seconds  Total Procedure Duration: 0 hours 15 minutes 14 seconds  Findings:                 The perianal and digital rectal examinations were                            normal.                           The colon (entire examined portion) appeared normal                            except for a very mild patch of erythema in the  descending colon. Biopsies were taken with a cold                            forceps for histology throughout the entire colon.                            Estimated blood loss was minimal.                           There was evidence of a non-patent end-to-side                            ileo-colonic anastomosis found in the neo-terminal                            Ileum. This was characterized by edema, friable                            mucosa and ulceration. This could not be traversed.                            Biopsies were taken with a cold forceps for                            histology. Estimated blood loss was minimal.                           The exam was otherwise without abnormality on                            direct and retroflexion views. Complications:            No immediate complications. Estimated blood loss:                             Minimal. Estimated Blood Loss:     Estimated blood loss was minimal. Impression:               - The entire examined colon is essentially normal                            except for a small patch of erythema in the                            descending colon. Biopsied.                           - Ulceration at the ileocolonic anastomosis.                            Biopsied.                           - The examination was otherwise normal on direct  and retroflexion views. Recommendation:           - Patient has a contact number available for                            emergencies. The signs and symptoms of potential                            delayed complications were discussed with the                            patient. Return to normal activities tomorrow.                            Written discharge instructions were provided to the                            patient.                           - Resume regular diet today.                           - Continue present medications.                           - Await pathology results.                           - Repeat colonoscopy after studies are complete for                            surveillance based on pathology results.                           - Return to GI office to discuss these results                            after the pathology results are available. Thornton Park MD, MD 01/17/2019 2:59:07 PM This report has been signed electronically.

## 2019-01-17 NOTE — Progress Notes (Signed)
Pt's states no medical or surgical changes since previsit or office visit. 

## 2019-01-17 NOTE — Progress Notes (Signed)
Called to room to assist during endoscopic procedure.  Patient ID and intended procedure confirmed with present staff. Received instructions for my participation in the procedure from the performing physician.  

## 2019-01-17 NOTE — Telephone Encounter (Signed)
Called pt to ask if he could come in earlier, 1300, due to a cancellation.  He agreed and stated he wouldn't drink any more liquid.

## 2019-01-18 ENCOUNTER — Telehealth: Payer: Self-pay

## 2019-01-18 NOTE — Telephone Encounter (Signed)
  Follow up Call-  Call back number 01/17/2019  Post procedure Call Back phone  # [phone]  Permission to leave phone message No  comments no voicemail  Some recent data might be hidden     Patient questions:  Do you have a fever, pain , or abdominal swelling? No. Pain Score  0 *  Have you tolerated food without any problems? Yes.    Have you been able to return to your normal activities? Yes.    Do you have any questions about your discharge instructions: Diet   No. Medications  No. Follow up visit  No.  Do you have questions or concerns about your Care? No.  Actions: * If pain score is 4 or above: No action needed, pain <4.   No problems noted per pt. maw

## 2019-01-30 ENCOUNTER — Other Ambulatory Visit: Payer: Self-pay

## 2019-01-30 MED ORDER — OMEPRAZOLE 40 MG PO CPDR: 40.0000 mg | DELAYED_RELEASE_CAPSULE | Freq: Every day | ORAL | 1 refills | Status: DC

## 2019-01-30 MED FILL — OMEPRAZOLE DR 40 MG CAPSULE: 40 | 30 days supply | Qty: 30 | Fill #0

## 2019-01-31 MED FILL — metFORMIN HCL 1000 MG TABS: 1000 | 30 days supply | Qty: 60 | Fill #5

## 2019-01-31 MED FILL — !ONGLYZA 2.5 MG TAB: 2.5 | 30 days supply | Qty: 30 | Fill #2

## 2019-01-31 MED FILL — ATORVASTATIN 10 MG TABLET: 10 | 30 days supply | Qty: 30 | Fill #5

## 2019-01-31 MED FILL — LISINOPRIL 20 MG TAB: 20 | 30 days supply | Qty: 30 | Fill #2

## 2019-02-07 ENCOUNTER — Other Ambulatory Visit: Payer: Self-pay

## 2019-02-07 ENCOUNTER — Telehealth (INDEPENDENT_AMBULATORY_CARE_PROVIDER_SITE_OTHER): Payer: Self-pay | Admitting: Gastroenterology

## 2019-02-07 DIAGNOSIS — K2970 Gastritis, unspecified, without bleeding: Secondary | ICD-10-CM

## 2019-02-07 DIAGNOSIS — K50919 Crohn's disease, unspecified, with unspecified complications: Secondary | ICD-10-CM

## 2019-02-07 DIAGNOSIS — R1084 Generalized abdominal pain: Secondary | ICD-10-CM
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
## 2019-02-07 NOTE — Patient Instructions (Addendum)
Have labs and stool studies done in the next couple of months Continue omeprazole 40 mg daily Continue to avoid NSAIDs (like ibuprofen) Follow-up with me in 3 months, earlier if needed

## 2019-02-20 IMAGING — CT CT ENTEROGRAPHY (ABD-PELV W/ CM)
2 of 6 series · 16 of 46 positions shown, 18 images · IV contrast (OMNIPAQUE)
Comparison: 03/14/2015

CLINICAL DATA: Abdominal pain. History of Crohn's disease. History
of prior bowel resection in 6486.

EXAM:
CT ABDOMEN AND PELVIS WITH CONTRAST (ENTEROGRAPHY)
TECHNIQUE: Multidetector CT of the abdomen and pelvis during bolus
administration of intravenous contrast. Negative oral contrast was
given.
CONTRAST:  100mL OMNIPAQUE IOHEXOL 300 MG/ML  SOLN

[Series 6: coronal · coronal · 0.88mm/px · 3 of 111 slices shown]
[im 37/111  soft-tissue]
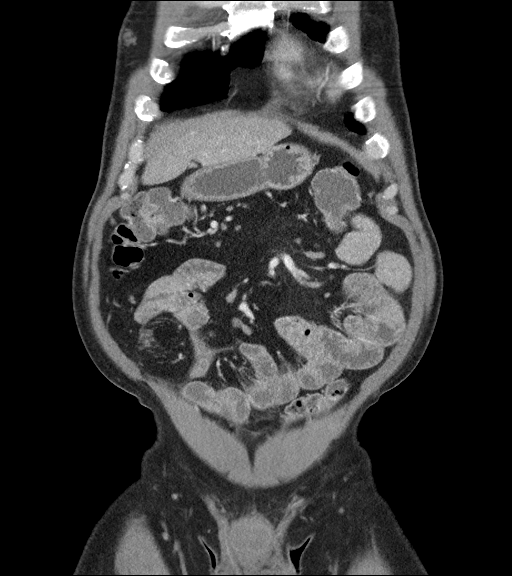
[im 49/111  soft-tissue]
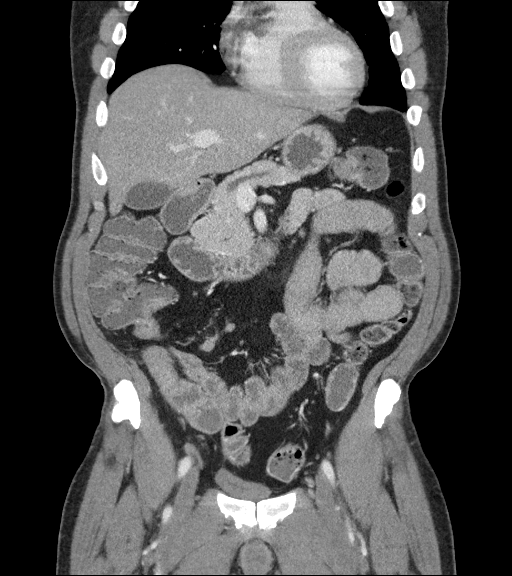
[im 62/111  soft-tissue]
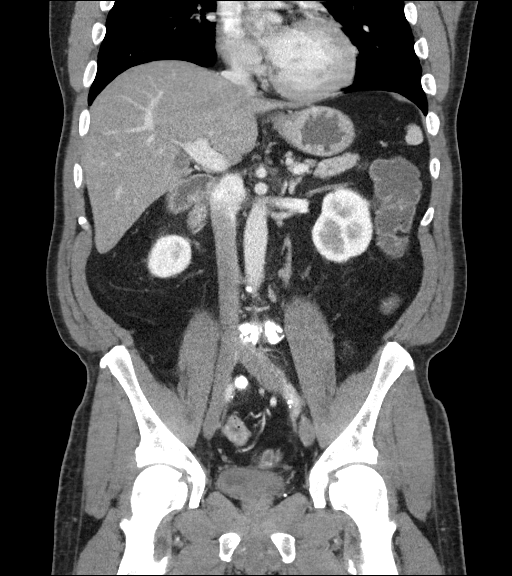

[Series 9: thins · axial · 0.79mm/px · z∈[-490,-38]mm · 13 of 506 slices shown, 15 images]
[im 27/506  soft-tissue]
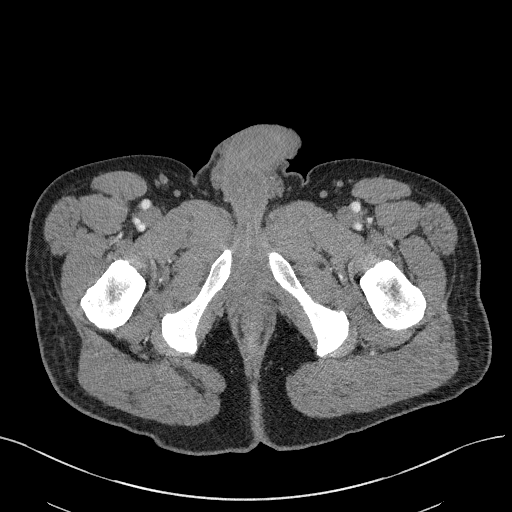
[im 27/506  bone]
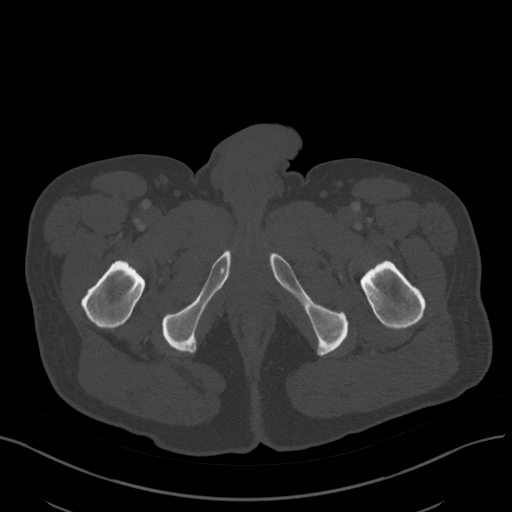
[im 80/506  soft-tissue]
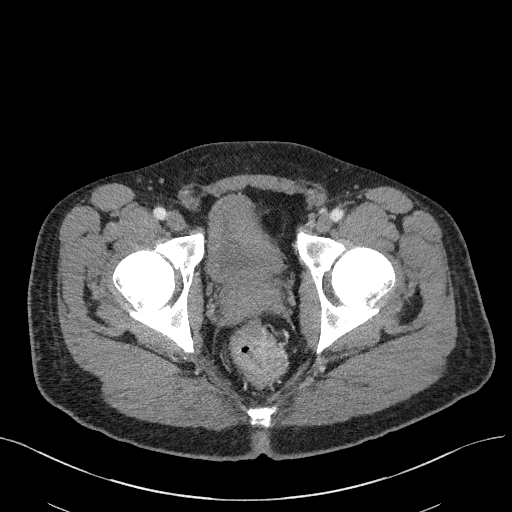
[im 107/506  soft-tissue]
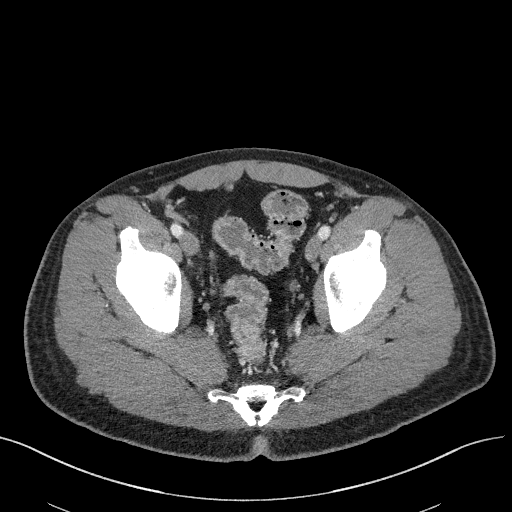
[im 133/506  soft-tissue]
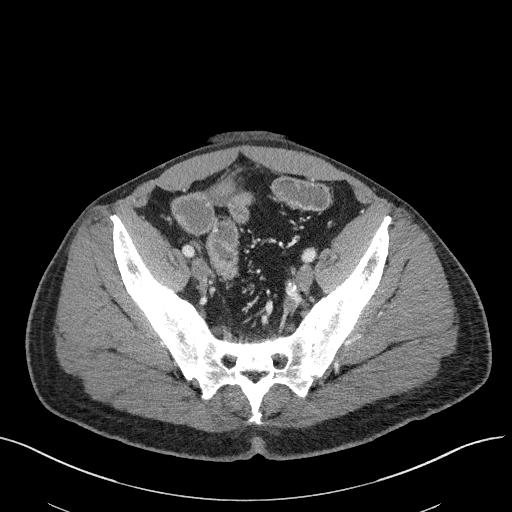
[im 187/506  soft-tissue]
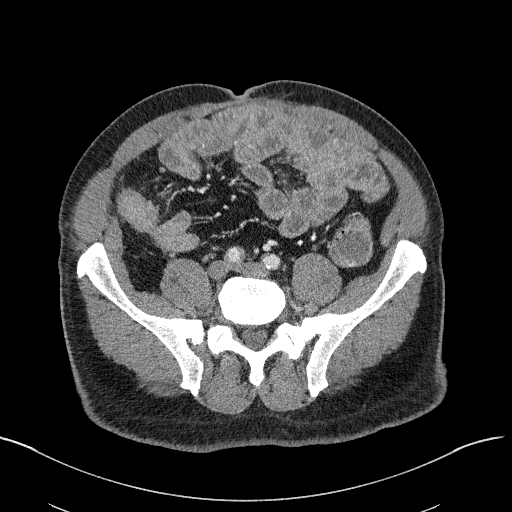
[im 213/506  soft-tissue]
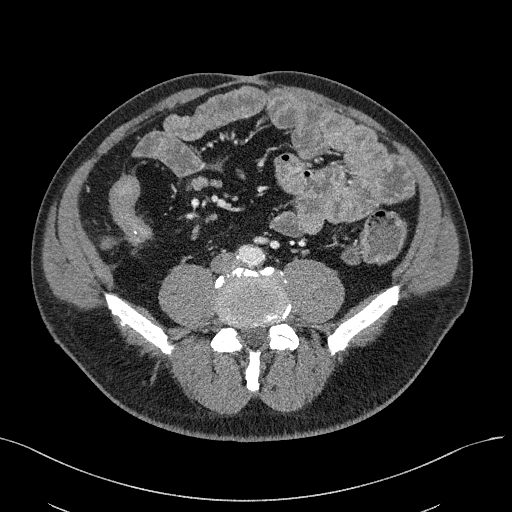
[im 266/506  soft-tissue]
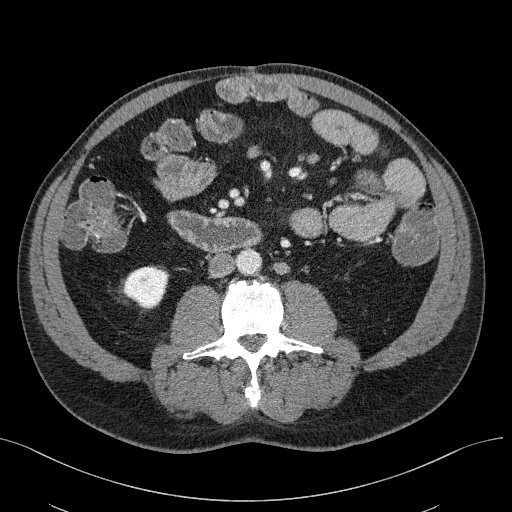
[im 293/506  soft-tissue]
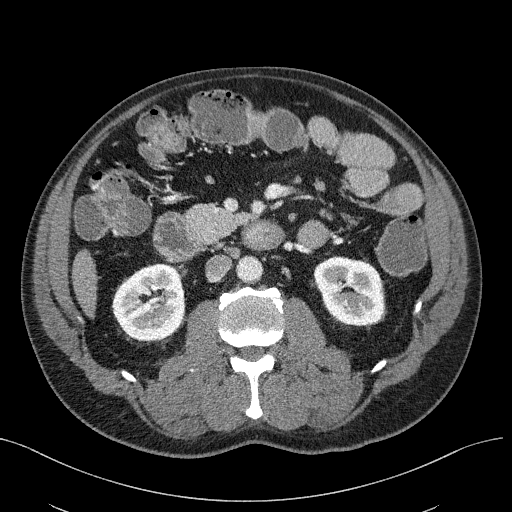
[im 319/506  soft-tissue]
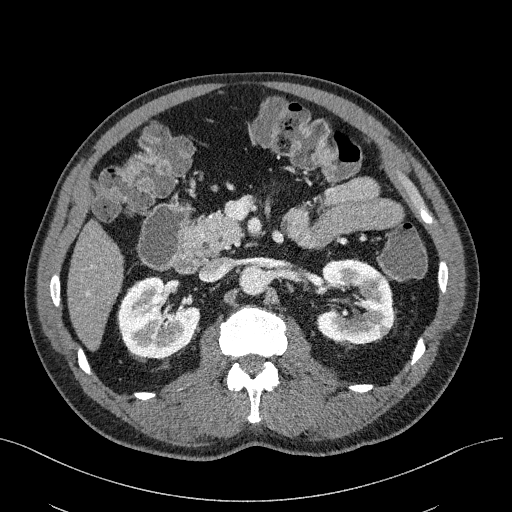
[im 319/506  bone]
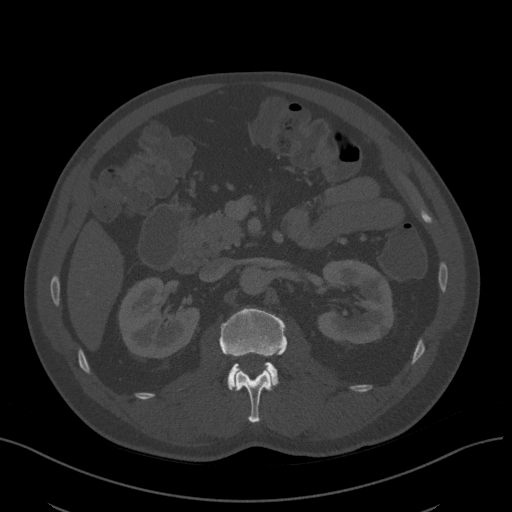
[im 373/506  soft-tissue]
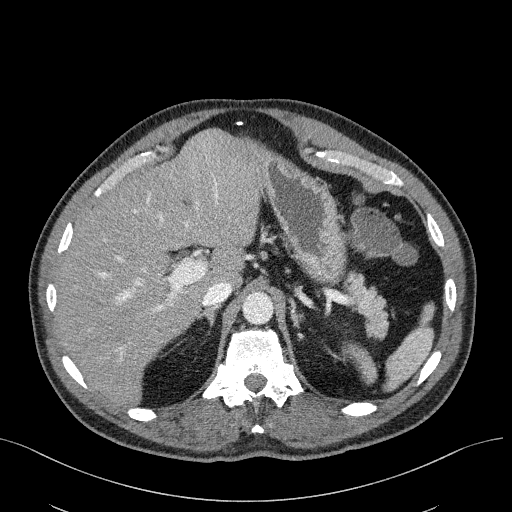
[im 399/506  soft-tissue]
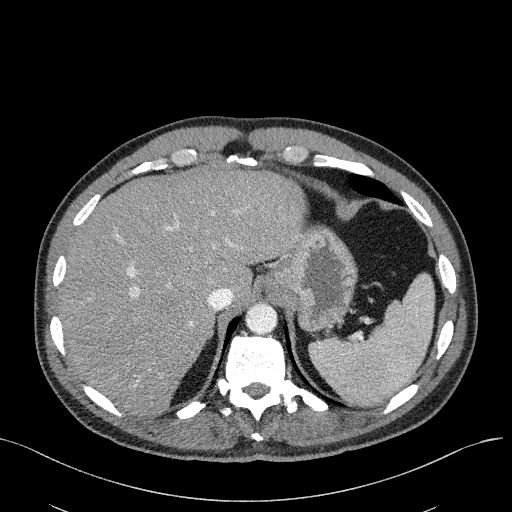
[im 426/506  soft-tissue]
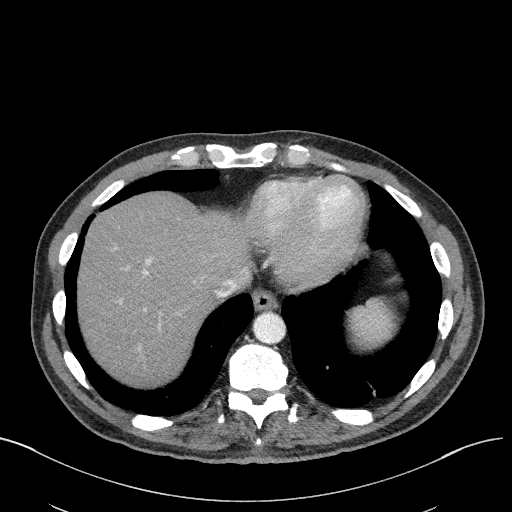
[im 479/506  soft-tissue]
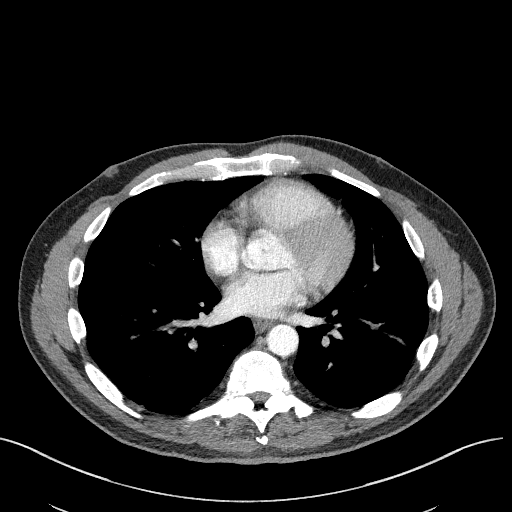

[16 of 46 positions shown; findings below may reference images not displayed]

FINDINGS: Lower chest: The lung bases are clear of an acute process. No
pulmonary lesions or pleural effusion. The heart is normal in size.
No pericardial effusion.

Hepatobiliary: No focal hepatic lesions or intrahepatic biliary
dilatation. Small layering gallstones are noted in the gallbladder
but no findings for acute cholecystitis. No common bile duct
dilatation.

Pancreas: No mass or acute inflammation. There is mild stable
prominence of the main pancreatic duct.

Spleen: Normal size.  No focal lesions.

Adrenals/Urinary Tract: The adrenal glands and kidneys are
unremarkable except for small renal calculi and a few small renal
cysts. No worrisome renal lesions or hydronephrosis. Mild uniform
bladder wall thickening likely due to lack of distension.

Stomach/Bowel: The stomach, duodenum, small bowel and colon are
unremarkable. No evidence of acute inflammatory process, mass lesion
or obstructive findings. There are stable surgical changes involving
the cecum and terminal ileum with ileocolonic anastomosis. There
appear to be chronic scarring type changes and narrowing of the neo
terminal ileum but I do not see any evidence of acute Crohn's
disease. No fistula or abscess is identified. No obstructive
findings.

Vascular/Lymphatic: The aorta and branch vessels are normal. The
major venous structures are patent. Stable scattered mesenteric and
retroperitoneal lymph nodes but no mass or overt adenopathy.

Reproductive: The prostate gland and seminal vesicles are
unremarkable.

Other: No pelvic mass or adenopathy. No free pelvic fluid
collections. No inguinal mass or adenopathy. No abdominal wall
hernia or subcutaneous lesions.

Musculoskeletal: Bilateral SI joint degenerative changes and partial
fusion likely due to chronic inflammatory bowel disease.
IMPRESSION: 1. No CT findings for active Crohn's disease.
2. Stable surgical changes with ileocolonic anastomosis. There are
some scarring changes in the adjacent small bowel mesentery but I do
not see any evidence or obstruction.
3. Stable cluster of lymph nodes in the small bowel mesentery but no
mass or overt adenopathy.
4. No acute abdominal findings or mass lesions.
5. Stable bilateral renal calculi but no obstructing ureteral
calculi. Cholelithiasis but no CT findings for acute cholecystitis.

## 2019-02-22 ENCOUNTER — Other Ambulatory Visit: Payer: Self-pay

## 2019-02-22 ENCOUNTER — Ambulatory Visit (INDEPENDENT_AMBULATORY_CARE_PROVIDER_SITE_OTHER): Payer: Medicaid Other | Admitting: Family Medicine

## 2019-02-22 ENCOUNTER — Encounter: Payer: Self-pay | Admitting: Family Medicine

## 2019-02-22 DIAGNOSIS — I10 Essential (primary) hypertension: Secondary | ICD-10-CM | POA: Diagnosis not present

## 2019-02-22 DIAGNOSIS — K50919 Crohn's disease, unspecified, with unspecified complications: Secondary | ICD-10-CM

## 2019-02-22 DIAGNOSIS — E785 Hyperlipidemia, unspecified: Secondary | ICD-10-CM | POA: Diagnosis not present

## 2019-02-22 DIAGNOSIS — E1140 Type 2 diabetes mellitus with diabetic neuropathy, unspecified: Secondary | ICD-10-CM

## 2019-02-22 NOTE — Progress Notes (Signed)
  [facility]  Virtual Visit via Telephone Note  I connected with Ricki Rodriguez on 02/22/19 at  8:20 AM EDT by telephone and verified that I am speaking with the correct person using two identifiers.   I discussed the limitations, risks, security and privacy concerns of performing an evaluation and management service by telephone and the availability of in person appointments. I also discussed with the patient that there may be a patient responsible charge related to this service. The patient expressed understanding and agreed to proceed.   History of Present Illness: Patient with a history of DM2. He states that he is following a lower carb diet and is compliant with medications. He states that his blood sugars are within range. He also has a history of HTN. He is compliant with medications. 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 He has a history of Crohn's and is followed by Dr. Tarri Glenn for this. He states that he was started on omeprazole for gas and bloating. Reports that it worked for the first week. He stopped it now and will call Dr. Tarri Glenn for further instructions. He states that his anxiety is lessening and he was stressed about not working. He is trying to do some part time work now. He is sleeping well and is not taking trazodone any more.     Observations/Objective: Patient with regular voice tone, rate and rhythm. Speaking calmly and is in no apparent distress.    Assessment and Plan: 1. Essential hypertension  2. Dyslipidemia  3. Type 2 diabetes mellitus with diabetic neuropathy, without long-term current use of insulin ([place])  4. Crohn's disease with complication, unspecified gastrointestinal tract location ([place]) No medication changes warranted at the present time.     Follow Up Instructions:   We discussed hand washing, using hand sanitizer when soap and water are not available, only going out when absolutely  necessary, and social distancing. Explained to patient that he is immunocompromised and will need to take precautions during this time.   I discussed the assessment and treatment plan with the patient. The patient was provided an opportunity to ask questions and all were answered. The patient agreed with the plan and demonstrated an understanding of the instructions.   The patient was advised to call back or seek an in-person evaluation if the symptoms worsen or if the condition fails to improve as anticipated.  I provided 10 minutes of non-face-to-face time during this encounter.  Ms. Andr L. Nathaneil Canary, FNP-BC Patient [facility] [address] [place], [place] [address] [phone]

## 2019-02-27 ENCOUNTER — Telehealth: Payer: Self-pay

## 2019-02-27 ENCOUNTER — Other Ambulatory Visit: Payer: Self-pay

## 2019-02-27 DIAGNOSIS — M25511 Pain in right shoulder: Secondary | ICD-10-CM

## 2019-02-27 MED ORDER — IBUPROFEN 800 MG PO TABS: 800.0000 mg | ORAL_TABLET | Freq: Three times a day (TID) | ORAL | 2 refills | Status: DC | PRN

## 2019-02-27 MED FILL — IBUPROFEN 800 MG TABLET: 800 | 10 days supply | Qty: 30 | Fill #0

## 2019-02-27 NOTE — Telephone Encounter (Signed)
Patient that medication has been sent to [facility]

## 2019-03-08 ENCOUNTER — Telehealth: Payer: Self-pay

## 2019-03-08 DIAGNOSIS — E1140 Type 2 diabetes mellitus with diabetic neuropathy, unspecified: Secondary | ICD-10-CM

## 2019-03-08 MED ORDER — SAXAGLIPTIN HCL 2.5 MG PO TABS: 2.5000 mg | ORAL_TABLET | Freq: Every day | ORAL | 5 refills | Status: DC

## 2019-03-08 NOTE — Telephone Encounter (Signed)
Refill for onglyza sent into pharmacy. Thanks!

## 2019-03-09 ENCOUNTER — Ambulatory Visit: Payer: Medicaid Other | Admitting: Family Medicine

## 2019-03-09 ENCOUNTER — Other Ambulatory Visit: Payer: Self-pay

## 2019-03-09 ENCOUNTER — Ambulatory Visit (INDEPENDENT_AMBULATORY_CARE_PROVIDER_SITE_OTHER): Payer: Medicaid Other | Admitting: Family Medicine

## 2019-03-09 DIAGNOSIS — E1140 Type 2 diabetes mellitus with diabetic neuropathy, unspecified: Secondary | ICD-10-CM | POA: Diagnosis not present

## 2019-03-09 DIAGNOSIS — K029 Dental caries, unspecified: Secondary | ICD-10-CM | POA: Diagnosis not present

## 2019-03-09 MED ORDER — AMOXICILLIN 500 MG PO CAPS: 500.0000 mg | ORAL_CAPSULE | Freq: Three times a day (TID) | ORAL | 0 refills | Status: AC

## 2019-03-09 MED FILL — AMOXICILLIN 500 MG CAPSULE: 500 | 10 days supply | Qty: 30 | Fill #0

## 2019-03-09 NOTE — Progress Notes (Signed)
  Patient [name]  Virtual Visit via Telephone Note  I connected with Jared [name] on 03/09/19 at  1:40 PM EDT by telephone and verified that I am speaking with the correct person using two identifiers.   I discussed the limitations, risks, security and privacy concerns of performing an evaluation and management service by telephone and the availability of in person appointments. I also discussed with the patient that there may be a patient responsible charge related to this service. The patient expressed understanding and agreed to proceed.   History of Present Illness: Jared [name]  has a past medical history of Crohn disease ([place]) (2008), Diabetes mellitus type II, and Hypertension. Patient reports that he is compliant with all medications and does not report side effects at the present time. He is not eating a carb midoified diet or exercising on a regular basis. He reports gingival swelling and erythema due to dental carries and is requesting an antibiotic until he can see his dentist. Denies fever, chills, night sweats, cough.    Observations/Objective: Patient with regular voice tone, rate and rhythm. Speaking calmly and is in no apparent distress.    Assessment and Plan: 1. Type 2 diabetes mellitus with diabetic neuropathy, without long-term current use of insulin ([facility]) No medication changes warranted at the present time.    2. Dental caries Advised patient to make appt with dentist  - amoxicillin (AMOXIL) 500 MG capsule; Take 1 capsule (500 mg total) by mouth 3 (three) times daily for 10 days.  Dispense: 30 capsule; Refill: 0    Follow Up Instructions:  We discussed hand washing, using hand sanitizer when soap and water are not available, only going out when absolutely necessary, and social distancing. Explained to patient that he is immunocompromised and will need to take precautions during this time.   I discussed the assessment  and treatment plan with the patient. The patient was provided an opportunity to ask questions and all were answered. The patient agreed with the plan and demonstrated an understanding of the instructions.   The patient was advised to call back or seek an in-person evaluation if the symptoms worsen or if the condition fails to improve as anticipated.  I provided 9  minutes of non-face-to-face time during this encounter.  Ms. Andr L. Nathaneil Canary, FNP-BC Patient [facility] [address] [place], [place] [address] [phone]

## 2019-03-12 ENCOUNTER — Other Ambulatory Visit: Payer: Self-pay

## 2019-03-12 ENCOUNTER — Telehealth: Payer: Self-pay | Admitting: Gastroenterology

## 2019-03-12 DIAGNOSIS — E1140 Type 2 diabetes mellitus with diabetic neuropathy, unspecified: Secondary | ICD-10-CM

## 2019-03-12 DIAGNOSIS — F419 Anxiety disorder, unspecified: Secondary | ICD-10-CM

## 2019-03-12 MED ORDER — ATORVASTATIN CALCIUM 10 MG PO TABS: 10.0000 mg | ORAL_TABLET | Freq: Every day | ORAL | 3 refills | Status: DC

## 2019-03-12 MED ORDER — METFORMIN HCL 1000 MG PO TABS: ORAL_TABLET | ORAL | 5 refills | Status: DC

## 2019-03-12 MED ORDER — HYDROXYZINE HCL 10 MG PO TABS: 10.0000 mg | ORAL_TABLET | Freq: Three times a day (TID) | ORAL | 3 refills | Status: DC | PRN

## 2019-03-12 MED ORDER — LISINOPRIL 20 MG PO TABS: 20.0000 mg | ORAL_TABLET | Freq: Every day | ORAL | 3 refills | Status: DC

## 2019-03-12 MED FILL — hydrOXYzine HCL 10 MG TABS: 10 | 30 days supply | Qty: 90 | Fill #0

## 2019-03-12 MED FILL — metFORMIN HCL 1000 MG TABS: 1000 | 30 days supply | Qty: 60 | Fill #0

## 2019-03-12 MED FILL — ATORVASTATIN 10 MG TABLET: 10 | 90 days supply | Qty: 90 | Fill #0

## 2019-03-12 MED FILL — LISINOPRIL 20 MG TAB: 20 | 30 days supply | Qty: 30 | Fill #0

## 2019-03-12 NOTE — Telephone Encounter (Signed)
I'm sorry that he's not feeling well. Please schedule a follow-up appointment with me this week. Thank you.

## 2019-03-12 NOTE — Telephone Encounter (Signed)
Patient called said that the med omeprazole (PRILOSEC) 40 MG is not helping him nomore

## 2019-03-12 NOTE — Telephone Encounter (Signed)
Patient states he is taking Omeprazole for about a month now and still feels very gassy and bloated. Does not feel that it is working has taken it before meals and after meals.

## 2019-03-13 NOTE — Telephone Encounter (Signed)
Spoke with patient, follow up made for 4/30

## 2019-03-15 ENCOUNTER — Ambulatory Visit (INDEPENDENT_AMBULATORY_CARE_PROVIDER_SITE_OTHER): Payer: Medicaid Other | Admitting: Gastroenterology

## 2019-03-15 ENCOUNTER — Encounter: Payer: Self-pay | Admitting: Gastroenterology

## 2019-03-15 ENCOUNTER — Other Ambulatory Visit: Payer: Self-pay

## 2019-03-15 VITALS — Ht 74.0 in | Wt 215.0 lb

## 2019-03-15 DIAGNOSIS — R140 Abdominal distension (gaseous): Secondary | ICD-10-CM | POA: Diagnosis not present

## 2019-03-15 DIAGNOSIS — R1084 Generalized abdominal pain: Secondary | ICD-10-CM

## 2019-03-15 DIAGNOSIS — K50919 Crohn's disease, unspecified, with unspecified complications: Secondary | ICD-10-CM | POA: Diagnosis not present

## 2019-03-15 NOTE — Addendum Note (Signed)
Addended by: Donell Beers on: 03/15/2019 02:59 PM   Modules accepted: Level of Service
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
## 2019-03-15 NOTE — Patient Instructions (Signed)
Increase omeprazole to 40 mg twice daily for 8 weeks.  Avoid all antiflammatory medications.  Come by for labs today or tomorrow. We will want to repeat those labs in 8 weeks.   Start budesonide 9 mg daily given the possibility that this is Crohn's disease.  Follow-up with me in 6-8 weeks, or earlier as needed.

## 2019-03-16 ENCOUNTER — Other Ambulatory Visit: Payer: Medicaid Other

## 2019-03-20 ENCOUNTER — Telehealth: Payer: Self-pay | Admitting: Gastroenterology

## 2019-03-20 DIAGNOSIS — R1084 Generalized abdominal pain: Secondary | ICD-10-CM

## 2019-03-20 DIAGNOSIS — R140 Abdominal distension (gaseous): Secondary | ICD-10-CM

## 2019-03-20 DIAGNOSIS — K50919 Crohn's disease, unspecified, with unspecified complications: Secondary | ICD-10-CM

## 2019-03-20 MED ORDER — BUDESONIDE 3 MG PO CPEP: 3.0000 mg | ORAL_CAPSULE | Freq: Every day | ORAL | 0 refills | Status: DC

## 2019-03-20 MED ORDER — OMEPRAZOLE 40 MG PO CPDR: DELAYED_RELEASE_CAPSULE | ORAL | 1 refills | Status: DC

## 2019-03-20 NOTE — Telephone Encounter (Signed)
Pt has been notified and aware to have labs drawn here at [facility] in the basement. He knows to start the new medication budesonide 9 mg a day. He's aware to take the omeprazole 40 bid for 8 weeks. New rx sent as requested to [facility] pharmacy.

## 2019-03-20 NOTE — Telephone Encounter (Signed)
Pt called in wanting to speak to the nurse. He wanted to know what can the doctor prescribe for pain since she taken him off of the ibuprofen... Also he would like to discuss steroids that were supposed to be prescribe to him he did not know the name of the medication.

## 2019-03-20 NOTE — Telephone Encounter (Signed)
Continue to avoid NSAIDs. Acetaminophen is a safe alternative. If he needs additional options, it may be best to review with his primary care provider.   I would like for him to continue on omeprazole given 40 mg BID until his symptoms are better controlled.   I recommended a fecal calprotectin, ESR, CRP last week and again in 8 weeks.   He was supposed to start budesonide 9 mg daily.  He was supposed to follow-up with me in 6-8 weeks, earlier if needed.  I am unsure why none of this was done as it was the plan at the time of his last encounter.  Thank you.

## 2019-03-20 NOTE — Telephone Encounter (Signed)
Patient has a few questions. 1. He wants to know what he can take OTC for general aches and pains, due to being told to stop NSAIDS. 2. Was he supposed to be given a new RX for  BID PPI ? 3. He was supposed to have labs done? I don't see an order, what labs would you like?  4. Was he to be prescribed budesonide 9 mg a day?

## 2019-03-21 ENCOUNTER — Other Ambulatory Visit (INDEPENDENT_AMBULATORY_CARE_PROVIDER_SITE_OTHER): Payer: Medicaid Other

## 2019-03-21 ENCOUNTER — Telehealth: Payer: Self-pay | Admitting: Gastroenterology

## 2019-03-21 DIAGNOSIS — K50919 Crohn's disease, unspecified, with unspecified complications: Secondary | ICD-10-CM | POA: Diagnosis not present

## 2019-03-21 DIAGNOSIS — R140 Abdominal distension (gaseous): Secondary | ICD-10-CM | POA: Diagnosis not present

## 2019-03-21 DIAGNOSIS — R1084 Generalized abdominal pain: Secondary | ICD-10-CM

## 2019-03-21 LAB — SEDIMENTATION RATE: Sed Rate: 67 mm/hr — ABNORMAL HIGH (ref 0–20)

## 2019-03-21 LAB — HIGH SENSITIVITY CRP: CRP, High Sensitivity: 26.02 mg/L — ABNORMAL HIGH (ref 0.000–5.000)

## 2019-03-21 MED ORDER — BUDESONIDE 3 MG PO CPEP: 3.0000 mg | ORAL_CAPSULE | Freq: Every day | ORAL | 0 refills | Status: DC

## 2019-03-21 MED FILL — OMEPRAZOLE DR 40 MG CAPSULE: 40 | 30 days supply | Qty: 60 | Fill #0

## 2019-03-21 NOTE — Telephone Encounter (Signed)
Patient when to get his medication and was told that budesonide (ENTOCORT EC) 3 MG was not sent in. Would like a refill

## 2019-03-21 NOTE — Telephone Encounter (Signed)
Informed patient that the prescription was sent to the pharmacy yesterday but we can resend the prescription again to [facility]. Informed patient to call the pharmacy first before going up there to make sure that have it. Patient verbalized understanding.

## 2019-03-23 MED FILL — BUDESONIDE 3 MG CPEP: 3 | 30 days supply | Qty: 30 | Fill #0

## 2019-03-29 LAB — CALPROTECTIN, FECAL: Calprotectin, Fecal: 213 ug/g — ABNORMAL HIGH (ref 0–120)

## 2019-03-30 ENCOUNTER — Ambulatory Visit: Payer: Medicaid Other | Admitting: Family Medicine

## 2019-03-30 ENCOUNTER — Other Ambulatory Visit: Payer: Self-pay

## 2019-04-10 ENCOUNTER — Encounter: Payer: Self-pay | Admitting: Family Medicine

## 2019-04-12 ENCOUNTER — Telehealth: Payer: Self-pay

## 2019-04-12 DIAGNOSIS — F419 Anxiety disorder, unspecified: Secondary | ICD-10-CM

## 2019-04-12 NOTE — Telephone Encounter (Signed)
Patient needs a note to clear him of mental health issues for a Gun permit. Can we please refer him for this? Patient is aware that we can not do this here. Thanks!

## 2019-04-16 ENCOUNTER — Telehealth: Payer: Self-pay

## 2019-04-16 ENCOUNTER — Telehealth: Payer: Self-pay | Admitting: Gastroenterology

## 2019-04-16 ENCOUNTER — Telehealth: Payer: Self-pay | Admitting: Family Medicine

## 2019-04-16 ENCOUNTER — Ambulatory Visit: Payer: Medicaid Other | Admitting: Family Medicine

## 2019-04-16 ENCOUNTER — Other Ambulatory Visit: Payer: Self-pay

## 2019-04-16 DIAGNOSIS — E1140 Type 2 diabetes mellitus with diabetic neuropathy, unspecified: Secondary | ICD-10-CM

## 2019-04-16 DIAGNOSIS — F419 Anxiety disorder, unspecified: Secondary | ICD-10-CM

## 2019-04-16 MED ORDER — OMEPRAZOLE 40 MG PO CPDR: 40.0000 mg | DELAYED_RELEASE_CAPSULE | Freq: Every day | ORAL | 1 refills | Status: DC

## 2019-04-16 MED ORDER — BUDESONIDE 3 MG PO CPEP: 9.0000 mg | ORAL_CAPSULE | Freq: Every day | ORAL | 3 refills | Status: DC

## 2019-04-16 MED ORDER — SAXAGLIPTIN HCL 2.5 MG PO TABS: 2.5000 mg | ORAL_TABLET | Freq: Every day | ORAL | 5 refills | Status: DC

## 2019-04-16 MED ORDER — LISINOPRIL 20 MG PO TABS: 20.0000 mg | ORAL_TABLET | Freq: Every day | ORAL | 3 refills | Status: DC

## 2019-04-16 MED ORDER — METFORMIN HCL 1000 MG PO TABS: ORAL_TABLET | ORAL | 5 refills | Status: DC

## 2019-04-16 MED ORDER — HYDROXYZINE HCL 10 MG PO TABS: 10.0000 mg | ORAL_TABLET | Freq: Three times a day (TID) | ORAL | 3 refills | Status: DC | PRN

## 2019-04-16 MED FILL — OMEPRAZOLE DR 40 MG CAPSULE: 40 | 30 days supply | Qty: 30 | Fill #0

## 2019-04-16 MED FILL — LISINOPRIL 20 MG TAB: 20 | 30 days supply | Qty: 30 | Fill #0

## 2019-04-16 MED FILL — hydrOXYzine HCL 10 MG TABS: 10 | 30 days supply | Qty: 90 | Fill #0

## 2019-04-16 MED FILL — metFORMIN HCL 1000 MG TABS: 1000 | 30 days supply | Qty: 60 | Fill #0

## 2019-04-16 NOTE — Telephone Encounter (Signed)
Called patient for telephone visit. No answer. Left message on voicemail.

## 2019-04-16 NOTE — Telephone Encounter (Signed)
Refills sent into pharmacy. Thanks!  

## 2019-04-16 NOTE — Telephone Encounter (Signed)
[facility]

## 2019-04-17 MED FILL — BUDESONIDE 3 MG CPEP: 3 | 30 days supply | Qty: 90 | Fill #0

## 2019-04-18 ENCOUNTER — Ambulatory Visit (INDEPENDENT_AMBULATORY_CARE_PROVIDER_SITE_OTHER): Payer: Medicaid Other | Admitting: Family Medicine

## 2019-04-18 ENCOUNTER — Encounter: Payer: Self-pay | Admitting: Family Medicine

## 2019-04-18 ENCOUNTER — Other Ambulatory Visit: Payer: Self-pay

## 2019-04-18 VITALS — BP 148/70 | HR 82 | Temp 98.7°F | Resp 14 | Ht 74.0 in | Wt 215.0 lb

## 2019-04-18 DIAGNOSIS — I10 Essential (primary) hypertension: Secondary | ICD-10-CM | POA: Diagnosis not present

## 2019-04-18 DIAGNOSIS — K50919 Crohn's disease, unspecified, with unspecified complications: Secondary | ICD-10-CM | POA: Diagnosis not present

## 2019-04-18 DIAGNOSIS — M545 Low back pain, unspecified: Secondary | ICD-10-CM

## 2019-04-18 DIAGNOSIS — R1031 Right lower quadrant pain: Secondary | ICD-10-CM

## 2019-04-18 DIAGNOSIS — E1140 Type 2 diabetes mellitus with diabetic neuropathy, unspecified: Secondary | ICD-10-CM

## 2019-04-18 LAB — POCT URINALYSIS DIPSTICK
Bilirubin, UA: NEGATIVE
Glucose, UA: POSITIVE — AB
Leukocytes, UA: NEGATIVE
Nitrite, UA: NEGATIVE
Protein, UA: POSITIVE — AB
Spec Grav, UA: >=1.03 — AB (ref 1.010–1.025)
Urobilinogen, UA: 0.2 E.U./dL
pH, UA: 6 (ref 5.0–8.0)

## 2019-04-18 LAB — POCT GLYCOSYLATED HEMOGLOBIN (HGB A1C): Hemoglobin A1C: 8.8 % — AB (ref 4.0–5.6)

## 2019-04-18 MED ORDER — SAXAGLIPTIN HCL 5 MG PO TABS: 5.0000 mg | ORAL_TABLET | Freq: Every day | ORAL | 3 refills | Status: DC

## 2019-04-18 MED ORDER — ACETAMINOPHEN-CODEINE #3 300-30 MG PO TABS: 1.0000 | ORAL_TABLET | ORAL | 0 refills | Status: DC | PRN

## 2019-04-18 MED ORDER — ALUM & MAG HYDROXIDE-SIMETH 200-200-20 MG/5ML PO SUSP
30.0000 mL | Freq: Once | ORAL | Status: AC
  Administered 2019-04-18: 30 mL via ORAL

## 2019-04-18 MED ORDER — KETOROLAC TROMETHAMINE 60 MG/2ML IM SOLN
60.0000 mg | Freq: Once | INTRAMUSCULAR | Status: AC
  Administered 2019-04-18: 60 mg via INTRAMUSCULAR

## 2019-04-18 MED FILL — ONGLYZA 5 MG TABLET: 5 | 30 days supply | Qty: 30 | Fill #0

## 2019-04-18 MED FILL — ACETAMINOPHEN/COD #3 TABLET: 300-30 | 5 days supply | Qty: 30 | Fill #0

## 2019-04-18 NOTE — Progress Notes (Signed)
Patient [name]   Progress Note: Sick Visit Provider: Lanae Boast, FNP  SUBJECTIVE:   Jared [name] is a 53 y.o. male who  has a past medical history of Crohn disease ([place]) (2008), Diabetes mellitus type II, and Hypertension.. Patient presents today for Back Pain (lower back on left side ) and Abdominal Pain (lower right abdominal pain )  Patient with a history of Crohn's Disease and is followed by GI- Dr. Tarri Glenn for this. He was last seen on 03/15/2019 and advised to stop NSAIDs, complete 8 weeks of prilosec, and to start budesonide 9 mg daily. Today, he presents with left sided flank  pain and right sided abdominal pain. He states that he has not had budesonide in a few days. He started having severe bloating and pain last night. Last bowel movement was this morning.  Review of Systems  Constitutional: Negative.   HENT: Negative.   Eyes: Negative.   Respiratory: Negative.   Cardiovascular: Negative.   Gastrointestinal: Positive for abdominal pain (and bloating).  Genitourinary: Negative.   Musculoskeletal: Positive for back pain (left flank).  Skin: Negative.   Neurological: Negative.   Psychiatric/Behavioral: Negative.      OBJECTIVE: BP (!) 148/70 (BP Location: Right Arm, Patient Position: Sitting, Cuff Size: Large)   Pulse 82   Temp 98.7 F (37.1 C) (Oral)   Resp 14   Ht 6' 2"  (1.88 m)   Wt 215 lb (97.5 kg)   SpO2 100%   BMI 27.60 kg/m   Wt Readings from Last 3 Encounters:  04/18/19 215 lb (97.5 kg)  03/15/19 215 lb (97.5 kg)  01/17/19 215 lb (97.5 kg)     Physical Exam Vitals signs and nursing note reviewed.  Constitutional:      General: He is not in acute distress.    Appearance: He is well-developed.  HENT:     Head: Normocephalic and atraumatic.  Eyes:     Conjunctiva/sclera: Conjunctivae normal.     Pupils: Pupils are equal, round, and reactive to light.  Neck:     Musculoskeletal: Normal range of motion.   Cardiovascular:     Rate and Rhythm: Normal rate and regular rhythm.     Heart sounds: Normal heart sounds.  Pulmonary:     Effort: Pulmonary effort is normal. No respiratory distress.     Breath sounds: Normal breath sounds.  Abdominal:     General: Bowel sounds are decreased. There is no distension.     Palpations: Abdomen is soft.     Tenderness: There is abdominal tenderness in the right lower quadrant, suprapubic area and left lower quadrant. Negative signs include Rovsing's sign and psoas sign.  Musculoskeletal: Normal range of motion.  Skin:    General: Skin is warm and dry.  Neurological:     Mental Status: He is alert and oriented to person, place, and time.  Psychiatric:        Mood and Affect: Mood normal.        Behavior: Behavior normal.        Thought Content: Thought content normal.     ASSESSMENT/PLAN:  1. Type 2 diabetes mellitus with diabetic neuropathy, without long-term current use of insulin (HCC) A1C has increased to 8.8. He is taking budesinide for Crohns Disease. Increasing onglyza to 78m.  - HgB A1c - saxagliptin HCl (ONGLYZA) 5 MG TABS tablet; Take 1 tablet (5 mg total) by mouth daily.  Dispense: 90 tablet; Refill: 3  2. Essential  hypertension No medication changes warranted at the present time.    3. Crohn's disease with complication, unspecified gastrointestinal tract location [facility]) Patient is being followed by a specialist for this condition. Patient advised to continue with follow up appointments. PCP will continue to monitor progress.   4. Left low back pain, unspecified chronicity, unspecified whether sciatica present Will check for kidney stones due to abnormal UA.  - Urinalysis Dipstick  5. Right lower quadrant abdominal pain - DG Abd 1 View; Future - acetaminophen-codeine (TYLENOL #3) 300-30 MG tablet; Take 1 tablet by mouth every 4 (four) hours as needed for moderate pain.  Dispense: 30 tablet; Refill: 0 - alum & mag hydroxide-simeth  (MAALOX/MYLANTA) 200-200-20 MG/5ML suspension 30 mL         The patient was given clear instructions to go to ER or return to medical center if symptoms do not improve, worsen or new problems develop. The patient verbalized understanding and agreed with plan of care.   Ms. Doug Sou. Nathaneil Canary, FNP-BC Patient [employer] [address] [place], [place] [address] [phone]     This note has been created with Dragon speech recognition software and smart phrase technology. Any transcriptional errors are unintentional.

## 2019-04-18 NOTE — Patient Instructions (Signed)
I am increasing your onglyza from 2.5 to 82m daily. We will continue to monitor your A1C. If it still increases, we will need to restart your insulin. Please watch your sugar and carb intake.  Breakfast:  Break your fast 2 slices of t[place]bacon 1 boiled egg 2 six ounce glasses of water Coffee/tea (Splenda/stevia) Snack:  High protein (10 almonds, cottage cheese, or greek yogurt)   Lunch:  Small garden salad (oil & vinegar)  can of tuna (or chicken breast, lamb chop, salmon, etc)   Snack:  Almonds ( or protein shake)   Dinner:  [employer] vegetable, and complex carbohydrate (brown rice, kenoa, sweet potato)  Diabetes Mellitus and Nutrition, Adult When you have diabetes (diabetes mellitus), it is very important to have healthy eating habits because your blood sugar (glucose) levels are greatly affected by what you eat and drink. Eating healthy foods in the appropriate amounts, at about the same times every day, can help you:  Control your blood glucose.  Lower your risk of heart disease.  Improve your blood pressure.  Reach or maintain a healthy weight. Every person with diabetes is different, and each person has different needs for a meal plan. Your health care provider may recommend that you work with a diet and nutrition specialist (dietitian) to make a meal plan that is best for you. Your meal plan may vary depending on factors such as:  The calories you need.  The medicines you take.  Your weight.  Your blood glucose, blood pressure, and cholesterol levels.  Your activity level.  Other health conditions you have, such as heart or kidney disease. How do carbohydrates affect me? Carbohydrates, also called carbs, affect your blood glucose level more than any other type of food. Eating carbs naturally raises the amount of glucose in your blood. Carb counting is a method for keeping track of how many carbs you eat. Counting carbs is important to keep  your blood glucose at a healthy level, especially if you use insulin or take certain oral diabetes medicines. It is important to know how many carbs you can safely have in each meal. This is different for every person. Your dietitian can help you calculate how many carbs you should have at each meal and for each snack. Foods that contain carbs include:  Bread, cereal, rice, pasta, and crackers.  Potatoes and corn.  Peas, beans, and lentils.  Milk and yogurt.  Fruit and juice.  Desserts, such as cakes, cookies, ice cream, and candy. How does alcohol affect me? Alcohol can cause a sudden decrease in blood glucose (hypoglycemia), especially if you use insulin or take certain oral diabetes medicines. Hypoglycemia can be a life-threatening condition. Symptoms of hypoglycemia (sleepiness, dizziness, and confusion) are similar to symptoms of having too much alcohol. If your health care provider says that alcohol is safe for you, follow these guidelines:  Limit alcohol intake to no more than 1 drink per day for nonpregnant women and 2 drinks per day for men. One drink equals 12 oz of beer, 5 oz of wine, or 1 oz of hard liquor.  Do not drink on an empty stomach.  Keep yourself hydrated with water, diet soda, or unsweetened iced tea.  Keep in mind that regular soda, juice, and other mixers may contain a lot of sugar and must be counted as carbs. What are tips for following this plan?  Reading food labels  Start by checking the serving size on the "Nutrition Facts" label  of packaged foods and drinks. The amount of calories, carbs, fats, and other nutrients listed on the label is based on one serving of the item. Many items contain more than one serving per package.  Check the total grams (g) of carbs in one serving. You can calculate the number of servings of carbs in one serving by dividing the total carbs by 15. For example, if a food has 30 g of total carbs, it would be equal to 2 servings  of carbs.  Check the number of grams (g) of saturated and trans fats in one serving. Choose foods that have low or no amount of these fats.  Check the number of milligrams (mg) of salt (sodium) in one serving. Most people should limit total sodium intake to less than 2,300 mg per day.  Always check the nutrition information of foods labeled as "low-fat" or "nonfat". These foods may be higher in added sugar or refined carbs and should be avoided.  Talk to your dietitian to identify your daily goals for nutrients listed on the label. Shopping  Avoid buying canned, premade, or processed foods. These foods tend to be high in fat, sodium, and added sugar.  Shop around the outside edge of the grocery store. This includes fresh fruits and vegetables, bulk grains, fresh meats, and fresh dairy. Cooking  Use low-heat cooking methods, such as baking, instead of high-heat cooking methods like deep frying.  Cook using healthy oils, such as olive, canola, or sunflower oil.  Avoid cooking with butter, cream, or high-fat meats. Meal planning  Eat meals and snacks regularly, preferably at the same times every day. Avoid going long periods of time without eating.  Eat foods high in fiber, such as fresh fruits, vegetables, beans, and whole grains. Talk to your dietitian about how many servings of carbs you can eat at each meal.  Eat 4-6 ounces (oz) of lean protein each day, such as lean meat, chicken, fish, eggs, or tofu. One oz of lean protein is equal to: ? 1 oz of meat, chicken, or fish. ? 1 egg. ?  cup of tofu.  Eat some foods each day that contain healthy fats, such as avocado, nuts, seeds, and fish. Lifestyle  Check your blood glucose regularly.  Exercise regularly as told by your health care provider. This may include: ? 150 minutes of moderate-intensity or vigorous-intensity exercise each week. This could be brisk walking, biking, or water aerobics. ? Stretching and doing strength  exercises, such as yoga or weightlifting, at least 2 times a week.  Take medicines as told by your health care provider.  Do not use any products that contain nicotine or tobacco, such as cigarettes and e-cigarettes. If you need help quitting, ask your health care provider.  Work with a Social worker or diabetes educator to identify strategies to manage stress and any emotional and social challenges. Questions to ask a health care provider  Do I need to meet with a diabetes educator?  Do I need to meet with a dietitian?  What number can I call if I have questions?  When are the best times to check my blood glucose? Where to find more information:  American Diabetes Association: diabetes.org  Academy of Nutrition and Dietetics: www.eatright.[employer] of Diabetes and Digestive and Kidney Diseases (NIH): [employer] Summary  A healthy meal plan will help you control your blood glucose and maintain a healthy lifestyle.  Working with a diet and nutrition specialist (dietitian) can help you make a  meal plan that is best for you.  Keep in mind that carbohydrates (carbs) and alcohol have immediate effects on your blood glucose levels. It is important to count carbs and to use alcohol carefully. This information is not intended to replace advice given to you by your health care provider. Make sure you discuss any questions you have with your health care provider. Document Released: 07/29/2005 Document Revised: 06/01/2017 Document Reviewed: 12/06/2016 Elsevier Interactive Patient Education  2019 [place].  Diabetes Mellitus and Standards of Medical Care Managing diabetes (diabetes mellitus) can be complicated. Your diabetes treatment may be managed by a team of health care providers, including:  A physician who specializes in diabetes (endocrinologist).  A nurse practitioner or physician assistant.  Nurses.  A diet and nutrition specialist (registered  dietitian).  A certified diabetes educator (CDE).  An exercise specialist.  A pharmacist.  An eye doctor.  A foot specialist (podiatrist).  A dentist.  A primary care provider.  A mental health provider. Your health care providers follow guidelines to help you get the best quality of care. The following schedule is a general guideline for your diabetes management plan. Your health care providers may give you more specific instructions. Physical exams Upon being diagnosed with diabetes mellitus, and each year after that, your health care provider will ask about your medical and family history. He or she will also do a physical exam. Your exam may include:  Measuring your height, weight, and body mass index (BMI).  Checking your blood pressure. This will be done at every routine medical visit. Your target blood pressure may vary depending on your medical conditions, your age, and other factors.  Thyroid gland exam.  Skin exam.  Screening for damage to your nerves (peripheral neuropathy). This may include checking the pulse in your legs and feet and checking the level of sensation in your hands and feet.  A complete foot exam to inspect the structure and skin of your feet, including checking for cuts, bruises, redness, blisters, sores, or other problems.  Screening for blood vessel (vascular) problems, which may include checking the pulse in your legs and feet and checking your temperature. Blood tests Depending on your treatment plan and your personal needs, you may have the following tests done:  HbA1c (hemoglobin A1c). This test provides information about blood sugar (glucose) control over the previous 2-3 months. It is used to adjust your treatment plan, if needed. This test will be done: ? At least 2 times a year, if you are meeting your treatment goals. ? 4 times a year, if you are not meeting your treatment goals or if treatment goals have changed.  Lipid testing,  including total, LDL, and HDL cholesterol and triglyceride levels. ? The goal for LDL is less than 100 mg/dL (5.5 mmol/L). If you are at high risk for complications, the goal is less than 70 mg/dL (3.9 mmol/L). ? The goal for HDL is 40 mg/dL (2.2 mmol/L) or higher for men and 50 mg/dL (2.8 mmol/L) or higher for women. An HDL cholesterol of 60 mg/dL (3.3 mmol/L) or higher gives some protection against heart disease. ? The goal for triglycerides is less than 150 mg/dL (8.3 mmol/L).  Liver function tests.  Kidney function tests.  Thyroid function tests. Dental and eye exams  Visit your dentist two times a year.  If you have type 1 diabetes, your health care provider may recommend an eye exam 3-5 years after you are diagnosed, and then once a year after  your first exam. ? For children with type 1 diabetes, a health care provider may recommend an eye exam when your child is age 27 or older and has had diabetes for 3-5 years. After the first exam, your child should get an eye exam once a year.  If you have type 2 diabetes, your health care provider may recommend an eye exam as soon as you are diagnosed, and then once a year after your first exam. Immunizations   The yearly flu (influenza) vaccine is recommended for everyone 6 months or older who has diabetes.  The pneumonia (pneumococcal) vaccine is recommended for everyone 2 years or older who has diabetes. If you are 77 or older, you may get the pneumonia vaccine as a series of two separate shots.  The hepatitis B vaccine is recommended for adults shortly after being diagnosed with diabetes.  Adults and children with diabetes should receive all other vaccines according to age-specific recommendations from the Centers for Disease Control and Prevention (CDC). Mental and emotional health Screening for symptoms of eating disorders, anxiety, and depression is recommended at the time of diagnosis and afterward as needed. If your screening shows  that you have symptoms (positive screening result), you may need more evaluation and you may work with a mental health care provider. Treatment plan Your treatment plan will be reviewed at every medical visit. You and your health care provider will discuss:  How you are taking your medicines, including insulin.  Any side effects you are experiencing.  Your blood glucose target goals.  The frequency of your blood glucose monitoring.  Lifestyle habits, such as activity level as well as tobacco, alcohol, and substance use. Diabetes self-management education Your health care provider will assess how well you are monitoring your blood glucose levels and whether you are taking your insulin correctly. He or she may refer you to:  A certified diabetes educator to manage your diabetes throughout your life, starting at diagnosis.  A registered dietitian who can create or review your personal nutrition plan.  An exercise specialist who can discuss your activity level and exercise plan. Summary  Managing diabetes (diabetes mellitus) can be complicated. Your diabetes treatment may be managed by a team of health care providers.  Your health care providers follow guidelines in order to help you get the best quality of care.  Standards of care including having regular physical exams, blood tests, blood pressure monitoring, immunizations, screening tests, and education about how to manage your diabetes.  Your health care providers may also give you more specific instructions based on your individual health. This information is not intended to replace advice given to you by your health care provider. Make sure you discuss any questions you have with your health care provider. Document Released: 08/29/2009 Document Revised: 07/21/2018 Document Reviewed: 07/30/2016 Elsevier Interactive Patient Education  2019 [employer].

## 2019-05-09 MED FILL — OMEPRAZOLE DR 40 MG CAPSULE: 40 | 30 days supply | Qty: 30 | Fill #1

## 2019-05-14 ENCOUNTER — Other Ambulatory Visit: Payer: Self-pay

## 2019-05-14 ENCOUNTER — Encounter: Payer: Self-pay | Admitting: Family Medicine

## 2019-05-14 ENCOUNTER — Ambulatory Visit (INDEPENDENT_AMBULATORY_CARE_PROVIDER_SITE_OTHER): Payer: Medicaid Other | Admitting: Family Medicine

## 2019-05-14 VITALS — BP 145/77 | HR 82 | Temp 98.7°F | Resp 16 | Ht 74.0 in | Wt 207.0 lb

## 2019-05-14 DIAGNOSIS — F17200 Nicotine dependence, unspecified, uncomplicated: Secondary | ICD-10-CM

## 2019-05-14 DIAGNOSIS — K50918 Crohn's disease, unspecified, with other complication: Secondary | ICD-10-CM | POA: Diagnosis not present

## 2019-05-14 DIAGNOSIS — K089 Disorder of teeth and supporting structures, unspecified: Secondary | ICD-10-CM

## 2019-05-14 DIAGNOSIS — G8929 Other chronic pain: Secondary | ICD-10-CM | POA: Diagnosis not present

## 2019-05-14 MED ORDER — CHLORHEXIDINE GLUCONATE 0.12 % MT SOLN: 15.0000 mL | Freq: Two times a day (BID) | OROMUCOSAL | 0 refills | Status: DC

## 2019-05-14 MED ORDER — AMOXICILLIN-POT CLAVULANATE 875-125 MG PO TABS: 1.0000 | ORAL_TABLET | Freq: Two times a day (BID) | ORAL | 0 refills | Status: DC

## 2019-05-14 MED FILL — AMOX-CLAV 875-125 MG TABLET: 875-125 | 10 days supply | Qty: 20 | Fill #0

## 2019-05-14 MED FILL — CHLORHEXIDINE 0.12% RINSE: 0.12 | 15 days supply | Qty: 473 | Fill #0

## 2019-05-14 MED FILL — IBUPROFEN 800 MG TABLET: 800 | 10 days supply | Qty: 30 | Fill #1

## 2019-05-14 NOTE — Patient Instructions (Signed)
Dental Caries  Dental caries are spots of decay (cavities) in teeth. They are in the outer layer of your tooth (enamel). Treat them as soon as you can. If they are not treated, they can spread decay and lead to painful infection. Follow these instructions at home: General instructions  Take good care of your mouth and teeth. This keeps them healthy. ? Brush your teeth 2 times a day. Use toothpaste with fluoride in it. ? Floss your teeth once a day.  If your dentist prescribed an antibiotic medicine to treat an infection, take it as told. Do not stop taking the antibiotic even if your condition gets better.  Keep all follow-up visits as told by your dentist. This is important. This includes all cleanings. Preventing dental caries   Brush your teeth every morning and night. Use fluoride toothpaste.  Get regular dental cleanings.  If you are at risk of dental caries. ? Wash your mouth with prescription mouthwash (chlorhexidine). ? Put topical fluoride on your teeth.  Drink water with fluoride in it.  Drink water instead of sugary drinks.  Eat healthy meals and snacks. Contact a doctor if:  You have symptoms of tooth decay. Summary  Dental caries are spots of decay (cavities) in teeth. They are in the outer layer of your tooth.  Take an antibiotic to treat an infection, if told by your dentist. Do not stop taking the antibiotic even if your condition gets better.  Regular dental cleanings and brushing can help prevent dental caries. This information is not intended to replace advice given to you by your health care provider. Make sure you discuss any questions you have with your health care provider. Document Released: 08/10/2008 Document Revised: 10/14/2017 Document Reviewed: 07/18/2016 Elsevier Patient Education  2020 [employer].

## 2019-05-14 NOTE — Progress Notes (Signed)
[name]   Progress Note: Sick Visit Provider: Lanae Boast, FNP  SUBJECTIVE:   Jared [name] is a 53 y.o. male who  has a past medical history of Crohn disease ([place]) (2008), Diabetes mellitus type II, and Hypertension.. Patient presents today for Abdominal Pain (abdominal pain post procedure in 2013  ), Pain (pt states whole body hurts off an on ), and Oral Pain (tooth/gum pain )  Patient presents today with gum and tooth pain. He states that he would like to have all of his teeth removed due to decay and pain. He states that he was in an accident as a child that required him to have root canals in several teeth. He has not been to see a dentist in several years.  He also states a history of "all over body pain" since having abdominal surgery in 2013. He was noted to be consuming large amounts of ibuprofen due to this with relief. However, he has now developed complications to crohn's due to this. He is currently on budesonide 44m daily by GI.   Review of Systems  Constitutional: Negative.   HENT:       Dental caries and pain   Eyes: Negative.   Respiratory: Negative.   Cardiovascular: Negative.   Gastrointestinal: Positive for abdominal pain.  Genitourinary: Negative.   Musculoskeletal: Positive for myalgias.  Skin: Negative.   Neurological: Negative.   Psychiatric/Behavioral: Negative.      OBJECTIVE: BP (!) 145/77 (BP Location: Left Arm, Patient Position: Sitting, Cuff Size: Large)   Pulse 82   Temp 98.7 F (37.1 C) (Oral)   Resp 16   Ht 6' 2"  (1.88 m)   Wt 207 lb (93.9 kg)   SpO2 100%   BMI 26.58 kg/m   Wt Readings from Last 3 Encounters:  05/14/19 207 lb (93.9 kg)  04/18/19 215 lb (97.5 kg)  03/15/19 215 lb (97.5 kg)     Physical Exam Constitutional:      General: He is not in acute distress.    Appearance: He is well-developed.  HENT:     Head: Normocephalic and atraumatic.     Mouth/Throat:     Mouth:  Mucous membranes are moist.     Dentition: Abnormal dentition. Dental tenderness, gingival swelling, dental caries and gum lesions present.  Eyes:     Extraocular Movements: Extraocular movements intact.  Neurological:     Mental Status: He is alert.     ASSESSMENT/PLAN: 1. Poor dentition Referred to dentistry for further evaluation. Will start on medications.  - chlorhexidine (PERIDEX) 0.12 % solution; Use as directed 15 mLs in the mouth or throat 2 (two) times daily.  Dispense: 120 mL; Refill: 0 - amoxicillin-clavulanate (AUGMENTIN) 875-125 MG tablet; Take 1 tablet by mouth 2 (two) times daily.  Dispense: 20 tablet; Refill: 0 - Ambulatory referral to Dentistry  2. Tobacco use disorder Smoking cessation instruction/counseling given:  counseled patient on the dangers of tobacco use, advised patient to stop smoking, and reviewed strategies to maximize success   3. Crohn's disease with other complication, unspecified gastrointestinal tract location ([facility] Patient is being followed by a specialist for this condition. Patient advised to continue with follow up appointments. PCP will continue to monitor progress.    4. Other chronic pain Discussed that this clinic does not do long term pain management.  - Ambulatory referral to Pain Clinic         The patient was given clear instructions to  go to ER or return to medical center if symptoms do not improve, worsen or new problems develop. The patient verbalized understanding and agreed with plan of care.   Ms. Doug Sou. Nathaneil Canary, FNP-BC [facility] [address] [place], [place] [address] [phone]     This note has been created with Dragon speech recognition software and smart phrase technology. Any transcriptional errors are unintentional.

## 2019-05-21 ENCOUNTER — Other Ambulatory Visit: Payer: Self-pay | Admitting: Emergency Medicine

## 2019-05-21 DIAGNOSIS — K50919 Crohn's disease, unspecified, with unspecified complications: Secondary | ICD-10-CM

## 2019-05-21 MED FILL — ONGLYZA 5 MG TABLET: 5 | 30 days supply | Qty: 30 | Fill #1

## 2019-05-21 MED FILL — metFORMIN HCL 1000 MG TABS: 1000 | 30 days supply | Qty: 60 | Fill #1

## 2019-05-23 ENCOUNTER — Telehealth: Payer: Self-pay | Admitting: Emergency Medicine

## 2019-05-23 NOTE — Telephone Encounter (Signed)
Budesonide is our best option. The alternative is prednisone, which is likely to cause even more hyperglycemia. Thank you for making the follow-up. I hope that he will continue the budesonide until that time.

## 2019-05-23 NOTE — Telephone Encounter (Signed)
Spoke with patient to remind him to have follow up labs from last OV. He states Budesonide is "running up his blood sugar" and he doesn't want to take it anymore. I urged him to have lab work done so Dr. Tarri Glenn will be able to see how he is doing and she can make a better decision after lab work. He agreed and verbalized understanding. He has a follow up scheduled for 06/15/2019.

## 2019-05-24 ENCOUNTER — Ambulatory Visit: Payer: Medicaid Other | Admitting: Family Medicine

## 2019-05-24 NOTE — Telephone Encounter (Signed)
Patient states he will continue budesonide and come for [facility] tomorrow. I advised him to follow up with his PCP regarding his blood sugar readings. He verbalized understanding.

## 2019-05-24 NOTE — Telephone Encounter (Signed)
Left message on patients voicemail to call office.   

## 2019-05-31 ENCOUNTER — Other Ambulatory Visit: Payer: Self-pay

## 2019-05-31 DIAGNOSIS — M25511 Pain in right shoulder: Secondary | ICD-10-CM

## 2019-05-31 DIAGNOSIS — F419 Anxiety disorder, unspecified: Secondary | ICD-10-CM

## 2019-05-31 DIAGNOSIS — E1140 Type 2 diabetes mellitus with diabetic neuropathy, unspecified: Secondary | ICD-10-CM

## 2019-05-31 MED ORDER — HYDROXYZINE HCL 10 MG PO TABS: 10.0000 mg | ORAL_TABLET | Freq: Three times a day (TID) | ORAL | 3 refills | Status: DC | PRN

## 2019-05-31 MED ORDER — BUDESONIDE 3 MG PO CPEP: 9.0000 mg | ORAL_CAPSULE | Freq: Every day | ORAL | 3 refills | Status: DC

## 2019-05-31 MED ORDER — OMEPRAZOLE 40 MG PO CPDR: 40.0000 mg | DELAYED_RELEASE_CAPSULE | Freq: Every day | ORAL | 1 refills | Status: DC

## 2019-05-31 MED ORDER — LISINOPRIL 20 MG PO TABS: 20.0000 mg | ORAL_TABLET | Freq: Every day | ORAL | 3 refills | Status: DC

## 2019-05-31 MED ORDER — ATORVASTATIN CALCIUM 10 MG PO TABS: 10.0000 mg | ORAL_TABLET | Freq: Every day | ORAL | 3 refills | Status: DC

## 2019-05-31 MED ORDER — IBUPROFEN 800 MG PO TABS: 800.0000 mg | ORAL_TABLET | Freq: Three times a day (TID) | ORAL | 2 refills | Status: DC | PRN

## 2019-05-31 MED FILL — hydrOXYzine HCL 10 MG TABS: 10 | 30 days supply | Qty: 90 | Fill #0

## 2019-05-31 MED FILL — IBUPROFEN 800 MG TABLET: 800 | 10 days supply | Qty: 30 | Fill #0

## 2019-05-31 MED FILL — BUDESONIDE 3 MG CAP: 3 | 30 days supply | Qty: 90 | Fill #0

## 2019-05-31 MED FILL — LISINOPRIL 20 MG TABLET: 20 | 30 days supply | Qty: 30 | Fill #0

## 2019-06-01 MED FILL — ATORVASTATIN 10 MG TABLET: 10 | 30 days supply | Qty: 30 | Fill #0

## 2019-06-15 ENCOUNTER — Ambulatory Visit (INDEPENDENT_AMBULATORY_CARE_PROVIDER_SITE_OTHER): Payer: Medicaid Other | Admitting: Gastroenterology

## 2019-06-15 ENCOUNTER — Encounter: Payer: Self-pay | Admitting: Gastroenterology

## 2019-06-15 ENCOUNTER — Other Ambulatory Visit (INDEPENDENT_AMBULATORY_CARE_PROVIDER_SITE_OTHER): Payer: Medicaid Other

## 2019-06-15 VITALS — Ht 74.0 in | Wt 207.0 lb

## 2019-06-15 DIAGNOSIS — R600 Localized edema: Secondary | ICD-10-CM

## 2019-06-15 DIAGNOSIS — K50919 Crohn's disease, unspecified, with unspecified complications: Secondary | ICD-10-CM | POA: Diagnosis not present

## 2019-06-15 DIAGNOSIS — R1084 Generalized abdominal pain: Secondary | ICD-10-CM | POA: Diagnosis not present

## 2019-06-15 LAB — SEDIMENTATION RATE: Sed Rate: 61 mm/hr — ABNORMAL HIGH (ref 0–20)

## 2019-06-15 LAB — C-REACTIVE PROTEIN: CRP: <1 mg/dL (ref 0.5–20.0)

## 2019-06-15 NOTE — Progress Notes (Signed)
Referring Provider: Lanae Boast, FNP Primary Care Physician:  Lanae Boast, FNP  Tele-visit due to COVID-19 pandemic Patient requested visit virtually, consented to the virtual encounter. He was unable to download the Zoom link, and the encounter was converted to a telephone call at his request.  Patient seems aware of  limitations, risks, security and privacy concerns of performing an evaluation and management service by telephone and the risk and availability of in person appointments  Contact made at: 16:10 06/15/19 Patient verified by name and date of birth Location of patient: Home Location provider: [facility] Names of persons participating: Me, patient, Tinnie Gens CMA Time spent on call: 22 minutes   Chief Complaint: Abdominal pain   IMPRESSION:  Crohn's ileitis status post small bowel resection 2013    -25 cm of terminal ileum, IC valve, and 5 cm of cecum were resected       -  active Crohn's ileitis on pathology    - has not required any medical therapy since that time    - no history of extra-GI manifestations of IBD    - 12/2018:  CRP 1.2, ESR 52, and fecal calprotectin 282    - 03/21/19: CRP 26, ESR 67, fecal calprotectin 213    - 06/15/19: CRP <1, ESR 61, fecal calprotectin pending    - CT enterography 01/02/19: no active Crohn's disease       -  stable surgical changes at the ileocolonic anastomosis       - stable cluster of lymph nodes in the small bowel mesentery    - Ileocolonic anastomoses ulcers on colonoscopy 01/15/19       - normal colonic mucosa biopsies H pylori negative gastritis and duodenitis, recently started PPI therapy    - likely related to NSAIDs Intermittent abdominal pain and bloating, now improved Diabetes with diabetic neuropathy Recent ibuprofen   ESR and CRP have dramatically improved on budesonide. Fecal calprotectin is still pending.   I favor treatment with budesonide over prednisone for active Crohn's disease if the concern  is exacerbation of his diabetes. Will ask NP Lanae Boast for input given the patient's concern.   He has an appointment with NP Lanae Boast to review his LE edema.   PLAN: Continue to avoid NSAIDs Fecal calprotectin are pending Budesonide 9 mg daily Follow-up with me in 6-8 weeks, earlier if needed    HPI: Jared [name] is a 53 y.o. male with Crohn's disease and abdominal pain.  The interval history is obtained through the patient and review of his electronic health record.   LE edema developed last week. Prevented him from coming in for his labs before this appointment. Has an appointment with NP Nathaneil Canary to review the edema. CRP <1. ESR 61. Fecal calprotectin is still pending.   Continues to have intermittent right-sided abdominal pain not improved with daily PPI therapy. No further bloating. No diarrhea. Having 3-4 bowel movements daily.  No identified triggers.   He is concerned that the steroids that I prescribed him (budesonide) is driving up his UXNA3F.   Weight stable. Appetite good. Energy level is good.  Walking regularly. No other new complaints or concerns at this time.   Past Medical History:  Diagnosis Date  . Crohn disease ([place]) 2008   with hospital admission in 2011, and 8/12 for flare up and SBO relieved with bowel rest. no suregery, no meds for CD  . Diabetes mellitus type II   . Hypertension  Past Surgical History:  Procedure Laterality Date  . BOWEL RESECTION  01/25/2012   Procedure: SMALL BOWEL RESECTION;  Surgeon: [name], MD;  Location: [facility];  Service: General;  Laterality: N/A;  . 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 LAPAROTOMY  01/25/2012   Procedure: EXPLORATORY LAPAROTOMY;  Surgeon: [name], MD;  Location: [facility];  Service: General;  Laterality: N/A;  . SCROTAL SURGERY  ~ 27   "took out a pellet"    Current Outpatient Medications  Medication Sig Dispense Refill  . amoxicillin-clavulanate (AUGMENTIN)  875-125 MG tablet Take 1 tablet by mouth 2 (two) times daily. 20 tablet 0  . atorvastatin (LIPITOR) 10 MG tablet Take 1 tablet (10 mg total) by mouth daily. 90 tablet 3  . Blood Glucose Monitoring Suppl (TRUE METRIX METER) w/Device KIT 1 each by Does not apply route 4 (four) times daily -  before meals and at bedtime. 1 kit 0  . budesonide (ENTOCORT EC) 3 MG 24 hr capsule Take 3 capsules (9 mg total) by mouth daily. 90 capsule 3  . chlorhexidine (PERIDEX) 0.12 % solution Use as directed 15 mLs in the mouth or throat 2 (two) times daily. 120 mL 0  . glucose blood (TRUE METRIX BLOOD GLUCOSE TEST) test strip Use as instructed 100 each 12  . hydrOXYzine (ATARAX/VISTARIL) 10 MG tablet Take 1 tablet (10 mg total) by mouth 3 (three) times daily as needed for anxiety. 90 tablet 3  . ibuprofen (ADVIL) 800 MG tablet Take 1 tablet (800 mg total) by mouth every 8 (eight) hours as needed. 30 tablet 2  . lisinopril (ZESTRIL) 20 MG tablet Take 1 tablet (20 mg total) by mouth daily. 30 tablet 3  . metFORMIN (GLUCOPHAGE) 1000 MG tablet TAKE 1 TABLET BY MOUTH 2 TIMES DAILY WITH A MEAL. 60 tablet 5  . omeprazole (PRILOSEC) 40 MG capsule Take twice a day for 8 weeks 60 capsule 1  . omeprazole (PRILOSEC) 40 MG capsule Take 1 capsule (40 mg total) by mouth daily. 30 capsule 1  . saxagliptin HCl (ONGLYZA) 5 MG TABS tablet Take 1 tablet (5 mg total) by mouth daily. 90 tablet 3   No current facility-administered medications for this visit.     Allergies as of 06/15/2019 - Review Complete 06/15/2019  Allergen Reaction Noted  . Trazodone and nefazodone  12/28/2018    Family History  Problem Relation Age of Onset  . Cancer Mother        lung  . Diabetes Mother   . Alopecia Mother   . Coronary artery disease Mother   . Diabetes Sister   . Hyperlipidemia Sister   . Hypertension Sister   . Angina Brother   . Hepatitis Brother   . Alcohol abuse Brother   . Diabetes Brother   . Colon cancer Paternal Grandmother    . Esophageal cancer Neg Hx   . Stomach cancer Neg Hx   . Rectal cancer Neg Hx     Social History   Socioeconomic History  . Marital status: Single    Spouse name: Not on file  . Number of children: 0  . Years of education: Not on file  . Highest education level: Not on file  Occupational History  . Not on file  Social Needs  . Financial resource strain: Not on file  . Food insecurity    Worry: Not on file    Inability: Not on file  . Transportation needs  Medical: Not on file    Non-medical: Not on file  Tobacco Use  . Smoking status: Current Every Day Smoker    Packs/day: 0.25    Years: 30.00    Pack years: 7.50    Types: Cigarettes  . Smokeless tobacco: Former Systems developer    Types: [place] date: 01/20/1997  Substance and Sexual Activity  . Alcohol use: Not Currently  . Drug use: No  . Sexual activity: Not Currently  Lifestyle  . Physical activity    Days per week: Not on file    Minutes per session: Not on file  . Stress: Not on file  Relationships  . Social Herbalist on phone: Not on file    Gets together: Not on file    Attends religious service: Not on file    Active member of club or organization: Not on file    Attends meetings of clubs or organizations: Not on file    Relationship status: Not on file  . Intimate partner violence    Fear of current or ex partner: Not on file    Emotionally abused: Not on file    Physically abused: Not on file    Forced sexual activity: Not on file  Other Topics Concern  . Not on file  Social History Narrative   Lives in [place].Is single. Has a sister in [place]. He has a twin brother that passed away from autoimmune hepatitis.  Smokes 2-3 cigarettes a day. Drinks beer once every few months. No history of drug abuse. Studied up to 12th grade. Has orange card. No health insurance. Currently employed cleaning buildings.    Physical Exam: General: in no acute distress Neuro: Alert and appropriate  Psych: Normal affect and normal insight Exam otherwise limited due to telehealth technologies.   Antwann Preziosi L. Tarri Glenn, MD, MPH [facility] 06/15/2019, 10:28 AM

## 2019-06-18 ENCOUNTER — Ambulatory Visit: Payer: Self-pay | Admitting: Family Medicine

## 2019-06-19 MED FILL — ONGLYZA 5 MG TABLET: 5 | 30 days supply | Qty: 30 | Fill #2

## 2019-06-19 MED FILL — metFORMIN HCL 1000 MG TABS: 1000 | 30 days supply | Qty: 60 | Fill #2

## 2019-06-20 ENCOUNTER — Other Ambulatory Visit: Payer: Self-pay

## 2019-06-20 ENCOUNTER — Telehealth: Payer: Self-pay

## 2019-06-20 MED ORDER — OMEPRAZOLE 40 MG PO CPDR: 40.0000 mg | DELAYED_RELEASE_CAPSULE | Freq: Every day | ORAL | 1 refills | Status: DC

## 2019-06-20 MED FILL — OMEPRAZOLE DR 40 MG CAPSULE: 40 | 30 days supply | Qty: 30 | Fill #0

## 2019-06-20 NOTE — Telephone Encounter (Signed)
Refill for omeprazole sent into pharmacy. Thanks !

## 2019-06-21 ENCOUNTER — Other Ambulatory Visit: Payer: Self-pay

## 2019-06-21 ENCOUNTER — Ambulatory Visit: Payer: Self-pay | Admitting: Family Medicine

## 2019-06-21 ENCOUNTER — Ambulatory Visit (INDEPENDENT_AMBULATORY_CARE_PROVIDER_SITE_OTHER): Payer: Medicaid Other | Admitting: Family Medicine

## 2019-06-21 ENCOUNTER — Encounter: Payer: Self-pay | Admitting: Family Medicine

## 2019-06-21 VITALS — BP 146/68 | HR 90 | Temp 98.5°F | Resp 16 | Ht 74.0 in | Wt 210.0 lb

## 2019-06-21 DIAGNOSIS — I10 Essential (primary) hypertension: Secondary | ICD-10-CM | POA: Diagnosis not present

## 2019-06-21 DIAGNOSIS — K089 Disorder of teeth and supporting structures, unspecified: Secondary | ICD-10-CM | POA: Diagnosis not present

## 2019-06-21 DIAGNOSIS — E1140 Type 2 diabetes mellitus with diabetic neuropathy, unspecified: Secondary | ICD-10-CM

## 2019-06-21 LAB — POCT URINALYSIS DIPSTICK
Bilirubin, UA: NEGATIVE
Glucose, UA: POSITIVE — AB
Leukocytes, UA: NEGATIVE
Nitrite, UA: NEGATIVE
Protein, UA: POSITIVE — AB
Spec Grav, UA: 1.025 (ref 1.010–1.025)
Urobilinogen, UA: 0.2 E.U./dL
pH, UA: 5.5 (ref 5.0–8.0)

## 2019-06-21 LAB — GLUCOSE, POCT (MANUAL RESULT ENTRY): POC Glucose: 221 mg/dl — AB (ref 70–99)

## 2019-06-21 MED ORDER — TRUE METRIX METER W/DEVICE KIT: 1.0000 | PACK | Freq: Three times a day (TID) | 0 refills | Status: DC

## 2019-06-21 MED ORDER — CHLORHEXIDINE GLUCONATE 0.12 % MT SOLN: 15.0000 mL | Freq: Two times a day (BID) | OROMUCOSAL | 0 refills | Status: DC

## 2019-06-21 NOTE — Progress Notes (Signed)
Patient [name]   Progress Note: Sick Visit Provider: Lanae Boast, FNP  SUBJECTIVE:   Jared [name] is a 53 y.o. male who  has a past medical history of Crohn disease ([place]) (2008), Diabetes mellitus type II, and Hypertension.. Patient presents today for Dizziness  Patient states that dizziness and swelling have resolved. He states that he had gotten confused about some of his medications. Since resolving medication issues, he states that the dizziness and leg swelling have ceased. Would like a refill on antibiotic mouth rinse.  Patient states that he smoked a cigarette prior to coming into the clinic.  Review of Systems  Constitutional: Negative.   HENT: Negative.   Eyes: Negative.   Respiratory: Negative.   Cardiovascular: Negative.   Gastrointestinal: Negative.   Genitourinary: Negative.   Musculoskeletal: Negative.   Skin: Negative.   Neurological: Negative.   Psychiatric/Behavioral: Negative.      OBJECTIVE: BP (!) 146/68   Pulse 90   Temp 98.5 F (36.9 C) (Oral)   Resp 16   Ht 6' 2"  (1.88 m)   Wt 210 lb (95.3 kg)   SpO2 100%   BMI 26.96 kg/m   Wt Readings from Last 3 Encounters:  06/21/19 210 lb (95.3 kg)  06/15/19 207 lb (93.9 kg)  05/14/19 207 lb (93.9 kg)     Physical Exam Vitals signs and nursing note reviewed.  Constitutional:      General: He is not in acute distress.    Appearance: Normal appearance.  HENT:     Head: Normocephalic and atraumatic.  Eyes:     Extraocular Movements: Extraocular movements intact.     Conjunctiva/sclera: Conjunctivae normal.     Pupils: Pupils are equal, round, and reactive to light.  Cardiovascular:     Rate and Rhythm: Normal rate and regular rhythm.     Heart sounds: No murmur.  Pulmonary:     Effort: Pulmonary effort is normal.     Breath sounds: Normal breath sounds.  Musculoskeletal: Normal range of motion.  Skin:    General: Skin is warm and dry.   Neurological:     Mental Status: He is alert and oriented to person, place, and time.  Psychiatric:        Mood and Affect: Mood normal.        Behavior: Behavior normal.        Thought Content: Thought content normal.        Judgment: Judgment normal.     ASSESSMENT/PLAN:   1. Essential hypertension BP reading decreased with time. No medication changes.  - Urinalysis Dipstick  2. Type 2 diabetes mellitus with diabetic neuropathy, without long-term current use of insulin ([place]) Patient lost meter and has not been able to check his CBGs at home. Will bring results to next visit. Will continue to monitor A1C at next visit.  - Glucose (CBG) - Blood Glucose Monitoring Suppl (TRUE METRIX METER) w/Device KIT; 1 each by Does not apply route 4 (four) times daily -  before meals and at bedtime.  Dispense: 1 kit; Refill: 0  3. Poor dentition - chlorhexidine (PERIDEX) 0.12 % solution; Use as directed 15 mLs in the mouth or throat 2 (two) times daily.  Dispense: 120 mL; Refill: 0         The patient was given clear instructions to go to ER or return to medical center if symptoms do not improve, worsen or new problems develop. The patient verbalized understanding and  agreed with plan of care.   Ms. Doug Sou. Nathaneil Canary, FNP-BC Patient [facility] [address] [place], [place] [address] [phone]     This note has been created with Dragon speech recognition software and smart phrase technology. Any transcriptional errors are unintentional.

## 2019-06-22 LAB — CALPROTECTIN, FECAL: Calprotectin, Fecal: 57 ug/g (ref 0–120)

## 2019-07-03 MED FILL — LISINOPRIL 20 MG TABLET: 20 | 30 days supply | Qty: 30 | Fill #1

## 2019-07-03 MED FILL — ATORVASTATIN 10 MG TABLET: 10 | 30 days supply | Qty: 30 | Fill #1

## 2019-07-06 MED FILL — CHLORHEXIDINE 0.12% RINSE: 0.12 | 15 days supply | Qty: 473 | Fill #0

## 2019-07-06 MED FILL — BUDESONIDE 3 MG CPEP: 3 | 30 days supply | Qty: 90 | Fill #1

## 2019-07-18 MED FILL — IBUPROFEN 800 MG TABLET: 800 | 10 days supply | Qty: 30 | Fill #1

## 2019-07-19 ENCOUNTER — Encounter: Payer: Self-pay | Admitting: Family Medicine

## 2019-07-19 ENCOUNTER — Ambulatory Visit (INDEPENDENT_AMBULATORY_CARE_PROVIDER_SITE_OTHER): Payer: Medicaid Other | Admitting: Family Medicine

## 2019-07-19 ENCOUNTER — Other Ambulatory Visit: Payer: Self-pay

## 2019-07-19 VITALS — BP 158/75 | HR 74 | Temp 98.7°F | Resp 16 | Ht 74.0 in | Wt 212.0 lb

## 2019-07-19 DIAGNOSIS — I10 Essential (primary) hypertension: Secondary | ICD-10-CM

## 2019-07-19 DIAGNOSIS — E1140 Type 2 diabetes mellitus with diabetic neuropathy, unspecified: Secondary | ICD-10-CM | POA: Diagnosis not present

## 2019-07-19 LAB — POCT URINALYSIS DIPSTICK
Bilirubin, UA: NEGATIVE
Blood, UA: NEGATIVE
Glucose, UA: NEGATIVE
Ketones, UA: NEGATIVE
Leukocytes, UA: NEGATIVE
Nitrite, UA: NEGATIVE
Protein, UA: NEGATIVE
Spec Grav, UA: 1.02 (ref 1.010–1.025)
Urobilinogen, UA: 0.2 E.U./dL
pH, UA: 6 (ref 5.0–8.0)

## 2019-07-19 LAB — POCT GLYCOSYLATED HEMOGLOBIN (HGB A1C): Hemoglobin A1C: 7.2 % — AB (ref 4.0–5.6)

## 2019-07-19 NOTE — Patient Instructions (Addendum)
My last day with this clinic will be on August 03, 2019. It has been a pleasure being a part of your care. I wish you the best of luck and good health.    Diabetes Mellitus and Nutrition, Adult When you have diabetes (diabetes mellitus), it is very important to have healthy eating habits because your blood sugar (glucose) levels are greatly affected by what you eat and drink. Eating healthy foods in the appropriate amounts, at about the same times every day, can help you:  Control your blood glucose.  Lower your risk of heart disease.  Improve your blood pressure.  Reach or maintain a healthy weight. Every person with diabetes is different, and each person has different needs for a meal plan. Your health care provider may recommend that you work with a diet and nutrition specialist (dietitian) to make a meal plan that is best for you. Your meal plan may vary depending on factors such as:  The calories you need.  The medicines you take.  Your weight.  Your blood glucose, blood pressure, and cholesterol levels.  Your activity level.  Other health conditions you have, such as heart or kidney disease. How do carbohydrates affect me? Carbohydrates, also called carbs, affect your blood glucose level more than any other type of food. Eating carbs naturally raises the amount of glucose in your blood. Carb counting is a method for keeping track of how many carbs you eat. Counting carbs is important to keep your blood glucose at a healthy level, especially if you use insulin or take certain oral diabetes medicines. It is important to know how many carbs you can safely have in each meal. This is different for every person. Your dietitian can help you calculate how many carbs you should have at each meal and for each snack. Foods that contain carbs include:  Bread, cereal, rice, pasta, and crackers.  Potatoes and corn.  Peas, beans, and lentils.  Milk and yogurt.  Fruit and juice.   Desserts, such as cakes, cookies, ice cream, and candy. How does alcohol affect me? Alcohol can cause a sudden decrease in blood glucose (hypoglycemia), especially if you use insulin or take certain oral diabetes medicines. Hypoglycemia can be a life-threatening condition. Symptoms of hypoglycemia (sleepiness, dizziness, and confusion) are similar to symptoms of having too much alcohol. If your health care provider says that alcohol is safe for you, follow these guidelines:  Limit alcohol intake to no more than 1 drink per day for nonpregnant women and 2 drinks per day for men. One drink equals 12 oz of beer, 5 oz of wine, or 1 oz of hard liquor.  Do not drink on an empty stomach.  Keep yourself hydrated with water, diet soda, or unsweetened iced tea.  Keep in mind that regular soda, juice, and other mixers may contain a lot of sugar and must be counted as carbs. What are tips for following this plan?  Reading food labels  Start by checking the serving size on the "Nutrition Facts" label of packaged foods and drinks. The amount of calories, carbs, fats, and other nutrients listed on the label is based on one serving of the item. Many items contain more than one serving per package.  Check the total grams (g) of carbs in one serving. You can calculate the number of servings of carbs in one serving by dividing the total carbs by 15. For example, if a food has 30 g of total carbs, it would be equal  to 2 servings of carbs.  Check the number of grams (g) of saturated and trans fats in one serving. Choose foods that have low or no amount of these fats.  Check the number of milligrams (mg) of salt (sodium) in one serving. Most people should limit total sodium intake to less than 2,300 mg per day.  Always check the nutrition information of foods labeled as "low-fat" or "nonfat". These foods may be higher in added sugar or refined carbs and should be avoided.  Talk to your dietitian to identify  your daily goals for nutrients listed on the label. Shopping  Avoid buying canned, premade, or processed foods. These foods tend to be high in fat, sodium, and added sugar.  Shop around the outside edge of the grocery store. This includes fresh fruits and vegetables, bulk grains, fresh meats, and fresh dairy. Cooking  Use low-heat cooking methods, such as baking, instead of high-heat cooking methods like deep frying.  Cook using healthy oils, such as olive, canola, or sunflower oil.  Avoid cooking with butter, cream, or high-fat meats. Meal planning  Eat meals and snacks regularly, preferably at the same times every day. Avoid going long periods of time without eating.  Eat foods high in fiber, such as fresh fruits, vegetables, beans, and whole grains. Talk to your dietitian about how many servings of carbs you can eat at each meal.  Eat 4-6 ounces (oz) of lean protein each day, such as lean meat, chicken, fish, eggs, or tofu. One oz of lean protein is equal to: ? 1 oz of meat, chicken, or fish. ? 1 egg. ?  cup of tofu.  Eat some foods each day that contain healthy fats, such as avocado, nuts, seeds, and fish. Lifestyle  Check your blood glucose regularly.  Exercise regularly as told by your health care provider. This may include: ? 150 minutes of moderate-intensity or vigorous-intensity exercise each week. This could be brisk walking, biking, or water aerobics. ? Stretching and doing strength exercises, such as yoga or weightlifting, at least 2 times a week.  Take medicines as told by your health care provider.  Do not use any products that contain nicotine or tobacco, such as cigarettes and e-cigarettes. If you need help quitting, ask your health care provider.  Work with a Social worker or diabetes educator to identify strategies to manage stress and any emotional and social challenges. Questions to ask a health care provider  Do I need to meet with a diabetes educator?  Do  I need to meet with a dietitian?  What number can I call if I have questions?  When are the best times to check my blood glucose? Where to find more information:  American Diabetes Association: diabetes.org  Academy of Nutrition and Dietetics: www.eatright.[employer] of Diabetes and Digestive and Kidney Diseases (NIH): [employer] Summary  A healthy meal plan will help you control your blood glucose and maintain a healthy lifestyle.  Working with a diet and nutrition specialist (dietitian) can help you make a meal plan that is best for you.  Keep in mind that carbohydrates (carbs) and alcohol have immediate effects on your blood glucose levels. It is important to count carbs and to use alcohol carefully. This information is not intended to replace advice given to you by your health care provider. Make sure you discuss any questions you have with your health care provider. Document Released: 07/29/2005 Document Revised: 10/14/2017 Document Reviewed: 12/06/2016 Elsevier Patient Education  2020 [employer].

## 2019-07-19 NOTE — Progress Notes (Signed)
  Patient [facility]   Progress Note: General Provider: Lanae Boast, FNP  SUBJECTIVE:   Jared [name] is a 53 y.o. male who  has a past medical history of Crohn disease ([place]) (2008), Diabetes mellitus type II, and Hypertension.. Patient presents today for Hypertension and Diabetes Patient last seen by Dr. Tarri Glenn for Crohn's disease in 06/15/2019. No medication changes since the last visit at this clinic. Last A1C on 04/18/2019 8.8. This was an increase from 6.7. He states that his stomach pain has resolved. Has not had pain since his last visit. He was unable to get omeprazole approved with his insurance.   Patient reports compliance with all medications.  Patient denies side effects of medications.  Review of Systems  Constitutional: Negative.   HENT: Negative.   Eyes: Negative.   Respiratory: Negative.   Cardiovascular: Negative.   Gastrointestinal: Negative.   Genitourinary: Negative.   Musculoskeletal: Negative.   Skin: Negative.   Neurological: Negative.   Psychiatric/Behavioral: Negative.      OBJECTIVE: BP (!) 158/75 (BP Location: Left Arm, Patient Position: Sitting, Cuff Size: Normal)   Pulse 74   Temp 98.7 F (37.1 C) (Oral)   Resp 16   Ht 6' 2"  (1.88 m)   Wt 212 lb (96.2 kg)   SpO2 100%   BMI 27.22 kg/m   Wt Readings from Last 3 Encounters:  07/19/19 212 lb (96.2 kg)  06/21/19 210 lb (95.3 kg)  06/15/19 207 lb (93.9 kg)     Physical Exam Vitals signs and nursing note reviewed.  Constitutional:      General: He is not in acute distress.    Appearance: Normal appearance.  HENT:     Head: Normocephalic and atraumatic.  Eyes:     Extraocular Movements: Extraocular movements intact.     Conjunctiva/sclera: Conjunctivae normal.     Pupils: Pupils are equal, round, and reactive to light.  Cardiovascular:     Rate and Rhythm: Normal rate and regular rhythm.     Heart sounds: No murmur.  Pulmonary:     Effort:  Pulmonary effort is normal.     Breath sounds: Normal breath sounds.  Musculoskeletal: Normal range of motion.  Skin:    General: Skin is warm and dry.  Neurological:     Mental Status: He is alert and oriented to person, place, and time.  Psychiatric:        Mood and Affect: Mood normal.        Behavior: Behavior normal.        Thought Content: Thought content normal.        Judgment: Judgment normal.     ASSESSMENT/PLAN:  1. Essential hypertension - Urinalysis Dipstick  2. Type 2 diabetes mellitus with diabetic neuropathy, without long-term current use of insulin ([facility]) A1C today 7.2. no medication changes.  - HgB A1c - Lipid Panel - Comprehensive metabolic panel     Return in about 3 months (around 10/18/2019) for HTN DM2.    The patient was given clear instructions to go to ER or return to medical center if symptoms do not improve, worsen or new problems develop. The patient verbalized understanding and agreed with plan of care.   Ms. Doug Sou. Nathaneil Canary, FNP-BC Patient [facility] [address] [place], [place] [address] [phone]

## 2019-07-20 ENCOUNTER — Telehealth: Payer: Self-pay

## 2019-07-20 LAB — COMPREHENSIVE METABOLIC PANEL
ALT: 16 IU/L (ref 0–44)
AST: 13 IU/L (ref 0–40)
Albumin/Globulin Ratio: 1.6 (ref 1.2–2.2)
Albumin: 3.9 g/dL (ref 3.8–4.9)
Alkaline Phosphatase: 79 IU/L (ref 39–117)
BUN/Creatinine Ratio: 11 (ref 9–20)
BUN: 11 mg/dL (ref 6–24)
Bilirubin Total: 0.3 mg/dL (ref 0.0–1.2)
CO2: 22 mmol/L (ref 20–29)
Calcium: 8.7 mg/dL (ref 8.7–10.2)
Chloride: 108 mmol/L — ABNORMAL HIGH (ref 96–106)
Creatinine, Ser: 1.02 mg/dL (ref 0.76–1.27)
GFR calc Af Amer: 97 mL/min/{1.73_m2} (ref >59)
GFR calc non Af Amer: 84 mL/min/{1.73_m2} (ref >59)
Globulin, Total: 2.4 g/dL (ref 1.5–4.5)
Glucose: 109 mg/dL — ABNORMAL HIGH (ref 65–99)
Potassium: 3.5 mmol/L (ref 3.5–5.2)
Sodium: 146 mmol/L — ABNORMAL HIGH (ref 134–144)
Total Protein: 6.3 g/dL (ref 6.0–8.5)

## 2019-07-20 LAB — LIPID PANEL
Chol/HDL Ratio: 2.6 ratio (ref 0.0–5.0)
Cholesterol, Total: 100 mg/dL (ref 100–199)
HDL: 38 mg/dL — ABNORMAL LOW (ref >39)
LDL Chol Calc (NIH): 48 mg/dL (ref 0–99)
Triglycerides: 61 mg/dL (ref 0–149)
VLDL Cholesterol Cal: 14 mg/dL (ref 5–40)

## 2019-07-20 NOTE — Telephone Encounter (Signed)
Called, no answer. Left a message that labs are stable and to continue medications and if any questions to call back to our office. Thanks!

## 2019-07-20 NOTE — Telephone Encounter (Signed)
-----   Message from Lanae Boast, [place] sent at 07/20/2019  8:25 AM EDT ----- Your labs are stable. Continue with your current medications. Please remember to keep your follow up appointment. If you have problems, questions or concerns, please make an appointment to discuss. Thanks!

## 2019-07-20 NOTE — Progress Notes (Signed)
Your labs are stable. Continue with your current medications. Please remember to keep your follow up appointment. If you have problems, questions or concerns, please make an appointment to discuss. Thanks!

## 2019-07-25 ENCOUNTER — Encounter (HOSPITAL_COMMUNITY): Payer: Self-pay | Admitting: *Deleted

## 2019-07-25 ENCOUNTER — Encounter (HOSPITAL_COMMUNITY): Payer: Self-pay

## 2019-07-26 MED FILL — ONGLYZA 5 MG TABLET: 5 | 30 days supply | Qty: 30 | Fill #3

## 2019-07-26 MED FILL — LISINOPRIL 20 MG TABLET: 20 | 30 days supply | Qty: 30 | Fill #2

## 2019-07-26 MED FILL — metFORMIN HCL 1000 MG TABS: 1000 | 30 days supply | Qty: 60 | Fill #3

## 2019-08-10 ENCOUNTER — Telehealth: Payer: Self-pay | Admitting: Family Medicine

## 2019-08-10 ENCOUNTER — Other Ambulatory Visit: Payer: Self-pay

## 2019-08-10 DIAGNOSIS — E1140 Type 2 diabetes mellitus with diabetic neuropathy, unspecified: Secondary | ICD-10-CM

## 2019-08-10 MED ORDER — ATORVASTATIN CALCIUM 10 MG PO TABS: 10.0000 mg | ORAL_TABLET | Freq: Every day | ORAL | 3 refills | Status: DC

## 2019-08-10 MED FILL — IBUPROFEN 800 MG TABLET: 800 | 10 days supply | Qty: 30 | Fill #2

## 2019-08-10 MED FILL — ATORVASTATIN 10 MG TABLET: 10 | 90 days supply | Qty: 90 | Fill #0

## 2019-08-10 MED FILL — hydrOXYzine HCL 10 MG TABS: 10 | 30 days supply | Qty: 90 | Fill #1

## 2019-08-10 NOTE — Telephone Encounter (Signed)
Called patient, he needed a refill for atorvastatin. This has been sent to pharmacy. Thanks!

## 2019-08-20 MED FILL — BUDESONIDE 3 MG CAP: 3 | 30 days supply | Qty: 90 | Fill #2

## 2019-08-30 MED FILL — metFORMIN HCL 1000 MG TABS: 1000 | 30 days supply | Qty: 60 | Fill #4

## 2019-08-30 MED FILL — LISINOPRIL 20 MG TABLET: 20 | 30 days supply | Qty: 30 | Fill #3

## 2019-08-30 MED FILL — ONGLYZA 5 MG TABLET: 5 | 30 days supply | Qty: 30 | Fill #4

## 2019-09-27 MED FILL — hydrOXYzine HCL 10 MG TABS: 10 | 30 days supply | Qty: 90 | Fill #2

## 2019-09-27 MED FILL — metFORMIN HCL 1000 MG TABS: 1000 | 30 days supply | Qty: 60 | Fill #5

## 2019-09-27 MED FILL — ONGLYZA 5 MG TABLET: 5 | 30 days supply | Qty: 30 | Fill #5

## 2019-09-27 MED FILL — IBUPROFEN 800 MG TABLET: 800 | 10 days supply | Qty: 30 | Fill #2

## 2019-09-27 MED FILL — LISINOPRIL 20 MG TABLET: 20 | 30 days supply | Qty: 30 | Fill #1

## 2019-10-04 ENCOUNTER — Telehealth: Payer: Self-pay | Admitting: Internal Medicine

## 2019-10-04 MED ORDER — BUDESONIDE 3 MG PO CPEP: 9.0000 mg | ORAL_CAPSULE | Freq: Every day | ORAL | 3 refills | Status: DC

## 2019-10-04 MED ORDER — OMEPRAZOLE 40 MG PO CPDR: 40.0000 mg | DELAYED_RELEASE_CAPSULE | Freq: Every day | ORAL | 1 refills | Status: DC

## 2019-10-04 MED FILL — BUDESONIDE 3 MG CAP: 3 | 30 days supply | Qty: 90 | Fill #0

## 2019-10-04 MED FILL — OMEPRAZOLE DR 40 MG CAPSULE: 40 | 30 days supply | Qty: 30 | Fill #0

## 2019-10-04 NOTE — Telephone Encounter (Signed)
Refills sent in

## 2019-10-18 ENCOUNTER — Ambulatory Visit: Payer: Self-pay | Admitting: Family Medicine

## 2019-10-30 MED FILL — metFORMIN HCL 1000 MG TABS: 1000 | 30 days supply | Qty: 60 | Fill #1

## 2019-10-30 MED FILL — IBUPROFEN 800 MG TABLET: 800 | 10 days supply | Qty: 30 | Fill #2

## 2019-10-30 MED FILL — ONGLYZA 5 MG TABLET: 5 | 30 days supply | Qty: 30 | Fill #6

## 2019-10-30 MED FILL — LISINOPRIL 20 MG TABLET: 20 | 30 days supply | Qty: 30 | Fill #2

## 2019-10-30 MED FILL — BUDESONIDE 3 MG CAP: 3 | 30 days supply | Qty: 90 | Fill #1

## 2019-10-30 MED FILL — hydrOXYzine HCL 10 MG TABS: 10 | 30 days supply | Qty: 90 | Fill #3

## 2019-10-30 MED FILL — OMEPRAZOLE DR 40 MG CAPSULE: 40 | 30 days supply | Qty: 30 | Fill #1

## 2019-11-19 ENCOUNTER — Other Ambulatory Visit: Payer: Self-pay | Admitting: Family Medicine

## 2019-11-19 DIAGNOSIS — M25511 Pain in right shoulder: Secondary | ICD-10-CM

## 2019-11-19 MED FILL — IBUPROFEN 800 MG TABLET: 800 | 10 days supply | Qty: 30 | Fill #0

## 2019-11-19 MED FILL — ATORVASTATIN 10 MG TABLET: 10 | 90 days supply | Qty: 90 | Fill #1

## 2019-11-30 MED FILL — OMEPRAZOLE DR 40 MG CAPSULE: 40 | 60 days supply | Qty: 60 | Fill #0

## 2019-11-30 MED FILL — ONGLYZA 5 MG TABLET: 5 | 90 days supply | Qty: 90 | Fill #7

## 2019-11-30 MED FILL — metFORMIN HCL 1000 MG TABS: 1000 | 90 days supply | Qty: 180 | Fill #2

## 2019-11-30 MED FILL — LISINOPRIL 20 MG TABLET: 20 | 30 days supply | Qty: 30 | Fill #3

## 2019-12-04 ENCOUNTER — Ambulatory Visit (INDEPENDENT_AMBULATORY_CARE_PROVIDER_SITE_OTHER): Payer: Medicaid Other | Admitting: Family Medicine

## 2019-12-04 ENCOUNTER — Encounter: Payer: Self-pay | Admitting: Family Medicine

## 2019-12-04 ENCOUNTER — Other Ambulatory Visit: Payer: Self-pay

## 2019-12-04 VITALS — BP 140/72 | HR 79 | Temp 98.1°F | Resp 16 | Ht 74.0 in | Wt 220.0 lb

## 2019-12-04 DIAGNOSIS — E1140 Type 2 diabetes mellitus with diabetic neuropathy, unspecified: Secondary | ICD-10-CM

## 2019-12-04 DIAGNOSIS — I10 Essential (primary) hypertension: Secondary | ICD-10-CM

## 2019-12-04 LAB — POCT URINALYSIS DIPSTICK
Blood, UA: NEGATIVE
Glucose, UA: POSITIVE — AB
Ketones, UA: 15
Leukocytes, UA: NEGATIVE
Nitrite, UA: NEGATIVE
Protein, UA: POSITIVE — AB
Spec Grav, UA: >=1.03 — AB
Urobilinogen, UA: 0.2 U/dL
pH, UA: 6

## 2019-12-04 LAB — POCT GLYCOSYLATED HEMOGLOBIN (HGB A1C): Hemoglobin A1C: 8.4 % — AB (ref 4.0–5.6)

## 2019-12-04 NOTE — Progress Notes (Signed)
Patient Jared [name]     Subjective:  Patient ID: Jared [name], male    DOB: 1966/02/19  Age: 54 y.o. MRN: [id number]  CC:  Chief Complaint  Patient presents with  . Hypertension  . Diabetes   Jared [name], a 54 year old male medical history significant for type 2 diabetes mellitus, hypertension and Crohn's disease presents for follow-up of chronic conditions.  Patient states that he has been doing well and has minimal complaints.  He states that he has been on medications over the past several weeks.  He restarted medications around 4 days ago.  He has not been following a carbohydrate modified diet and has not been exercising consistently.  Hypertension This is a chronic problem. The current episode started more than 1 year ago. The problem is controlled. Pertinent negatives include no anxiety, blurred vision, chest pain, headaches, malaise/fatigue, orthopnea, palpitations, peripheral edema, PND, shortness of breath or sweats.  Diabetes He presents for his follow-up diabetic visit. He has type 2 diabetes mellitus. Pertinent negatives for hypoglycemia include no headaches or sweats. Pertinent negatives for diabetes include no blurred vision, no chest pain, no fatigue, no polydipsia, no polyphagia, no polyuria, no visual change, no weakness and no weight loss. Symptoms are stable.     Past Medical History:  Diagnosis Date  . Crohn disease ([place]) 2008   with hospital admission in 2011, and 8/12 for flare up and SBO relieved with bowel rest. no suregery, no meds for CD  . Diabetes mellitus type II   . Hypertension     Past Surgical History:  Procedure Laterality Date  . BOWEL RESECTION  01/25/2012   Procedure: SMALL BOWEL RESECTION;  Surgeon: [name], MD;  Location: [facility];  Service: General;  Laterality: N/A;  . 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 LAPAROTOMY  01/25/2012   Procedure: EXPLORATORY LAPAROTOMY;   Surgeon: [name], MD;  Location: [facility];  Service: General;  Laterality: N/A;  . SCROTAL SURGERY  ~ 7   "took out a pellet"    Family History  Problem Relation Age of Onset  . Cancer Mother        lung  . Diabetes Mother   . Alopecia Mother   . Coronary artery disease Mother   . Diabetes Sister   . Hyperlipidemia Sister   . Hypertension Sister   . Angina Brother   . Hepatitis Brother   . Alcohol abuse Brother   . Diabetes Brother   . Colon cancer Paternal Grandmother   . Esophageal cancer Neg Hx   . Stomach cancer Neg Hx   . Rectal cancer Neg Hx     Social History   Socioeconomic History  . Marital status: Single    Spouse name: Not on file  . Number of children: 0  . Years of education: Not on file  . Highest education level: Not on file  Occupational History  . Not on file  Tobacco Use  . Smoking status: Current Every Day Smoker    Packs/day: 0.25    Years: 30.00    Pack years: 7.50    Types: Cigarettes  . Smokeless tobacco: Former Systems developer    Types: [place] date: 01/20/1997  Substance and Sexual Activity  . Alcohol use: Not Currently  . Drug use: No  . Sexual activity: Not Currently  Other Topics Concern  . Not on file  Social History  Narrative   Lives in [place].Is single. Has a sister in [place]. He has a twin brother that passed away from autoimmune hepatitis.  Smokes 2-3 cigarettes a day. Drinks beer once every few months. No history of drug abuse. Studied up to 12th grade. Has orange card. No health insurance. Currently employed cleaning buildings.   Social Determinants of Health   Financial Resource Strain:   . Difficulty of Paying Living Expenses: Not on file  Food Insecurity:   . Worried About Charity fundraiser in the Last Year: Not on file  . Ran Out of Food in the Last Year: Not on file  Transportation Needs:   . Lack of Transportation (Medical): Not on file  . Lack of Transportation (Non-Medical): Not on file  Physical  Activity:   . Days of Exercise per Week: Not on file  . Minutes of Exercise per Session: Not on file  Stress:   . Feeling of Stress : Not on file  Social Connections:   . Frequency of Communication with Friends and Family: Not on file  . Frequency of Social Gatherings with Friends and Family: Not on file  . Attends Religious Services: Not on file  . Active Member of Clubs or Organizations: Not on file  . Attends Archivist Meetings: Not on file  . Marital Status: Not on file  Intimate Partner Violence:   . Fear of Current or Ex-Partner: Not on file  . Emotionally Abused: Not on file  . Physically Abused: Not on file  . Sexually Abused: Not on file    Outpatient Medications Prior to Visit  Medication Sig Dispense Refill  . atorvastatin (LIPITOR) 10 MG tablet Take 1 tablet (10 mg total) by mouth daily. 90 tablet 3  . Blood Glucose Monitoring Suppl (TRUE METRIX METER) w/Device KIT 1 each by Does not apply route 4 (four) times daily -  before meals and at bedtime. 1 kit 0  . budesonide (ENTOCORT EC) 3 MG 24 hr capsule Take 3 capsules (9 mg total) by mouth daily. 90 capsule 3  . glucose blood (TRUE METRIX BLOOD GLUCOSE TEST) test strip Use as instructed 100 each 12  . hydrOXYzine (ATARAX/VISTARIL) 10 MG tablet Take 1 tablet (10 mg total) by mouth 3 (three) times daily as needed for anxiety. 90 tablet 3  . ibuprofen (ADVIL) 800 MG tablet TAKE 1 TABLET (800 MG TOTAL) BY MOUTH EVERY 8 (EIGHT) HOURS AS NEEDED. 30 tablet 2  . lisinopril (ZESTRIL) 20 MG tablet Take 1 tablet (20 mg total) by mouth daily. 30 tablet 3  . metFORMIN (GLUCOPHAGE) 1000 MG tablet TAKE 1 TABLET BY MOUTH 2 TIMES DAILY WITH A MEAL. 60 tablet 5  . omeprazole (PRILOSEC) 40 MG capsule Take 1 capsule (40 mg total) by mouth daily. 30 capsule 1  . saxagliptin HCl (ONGLYZA) 5 MG TABS tablet Take 1 tablet (5 mg total) by mouth daily. 90 tablet 3  . chlorhexidine (PERIDEX) 0.12 % solution Use as directed 15 mLs in the  mouth or throat 2 (two) times daily. (Patient not taking: Reported on 12/04/2019) 120 mL 0   No facility-administered medications prior to visit.    Allergies  Allergen Reactions  . Trazodone And Nefazodone     hallucinations    ROS Review of Systems  Constitutional: Negative.  Negative for fatigue, malaise/fatigue and weight loss.  HENT: Negative.   Eyes: Negative for blurred vision.  Respiratory: Negative for shortness of breath.   Cardiovascular: Negative for chest pain,  palpitations, orthopnea and PND.  Gastrointestinal: Negative.   Endocrine: Negative for polydipsia, polyphagia and polyuria.  Musculoskeletal: Negative.   Skin: Negative.   Neurological: Negative for weakness and headaches.  Hematological: Negative.   Psychiatric/Behavioral: Negative.       Objective:    Physical Exam  Constitutional: He is oriented to person, place, and time. He appears well-developed and well-nourished.  HENT:  Head: Normocephalic and atraumatic.  Eyes: Pupils are equal, round, and reactive to light.  Cardiovascular: Normal rate and regular rhythm.  Pulmonary/Chest: Effort normal and breath sounds normal.  Abdominal: Soft. Bowel sounds are normal.  Musculoskeletal:     Cervical back: Normal range of motion.  Neurological: He is alert and oriented to person, place, and time.  Skin: Skin is warm and dry.  Psychiatric: He has a normal mood and affect. His behavior is normal. Judgment and thought content normal.    BP 140/72 (BP Location: Right Arm, Patient Position: Sitting, Cuff Size: Normal)   Pulse 79   Temp 98.1 F (36.7 C) (Oral)   Resp 16   Ht _0  (1.88 m)   Wt 220 lb (99.8 kg)   SpO2 99%   BMI 28.25 kg/m  Wt Readings from Last 3 Encounters:  12/04/19 220 lb (99.8 kg)  07/19/19 212 lb (96.2 kg)  06/21/19 210 lb (95.3 kg)      No results found for: TSH Lab Results  Component Value Date   WBC 9.5 11/19/2018   HGB 13.1 11/19/2018   HCT 41.3 11/19/2018   MCV  87.1 11/19/2018   PLT 309 11/19/2018   Lab Results  Component Value Date   NA 146 (H) 07/19/2019   K 3.5 07/19/2019   CO2 22 07/19/2019   GLUCOSE 109 (H) 07/19/2019   BUN 11 07/19/2019   CREATININE 1.02 07/19/2019   BILITOT 0.3 07/19/2019   ALKPHOS 79 07/19/2019   AST 13 07/19/2019   ALT 16 07/19/2019   PROT 6.3 07/19/2019   ALBUMIN 3.9 07/19/2019   CALCIUM 8.7 07/19/2019   ANIONGAP 11 11/19/2018   Lab Results  Component Value Date   CHOL 100 07/19/2019   Lab Results  Component Value Date   HDL 38 (L) 07/19/2019   Lab Results  Component Value Date   LDLCALC 48 07/19/2019   Lab Results  Component Value Date   TRIG 61 07/19/2019   Lab Results  Component Value Date   CHOLHDL 2.6 07/19/2019   Lab Results  Component Value Date   HGBA1C 8.4 (A) 12/04/2019      Assessment & Plan:   Problem List Items Addressed This Visit      Cardiovascular and Mediastinum   HTN (hypertension) - Primary   Relevant Orders   Urinalysis Dipstick     Endocrine   DM (diabetes mellitus) ([place])   Relevant Orders   Urinalysis Dipstick   HgB A1c (Completed)      Essential hypertension - Continue medication, monitor blood pressure at home. Continue DASH diet. Reminder to go to the ER if any CP, SOB, nausea, dizziness, severe HA, changes vision/speech, left arm numbness and tingling and jaw pain. - Urinalysis Dipstick - Comprehensive metabolic panel  Type 2 diabetes mellitus with diabetic neuropathy, without long-term current use of insulin (HCC) Hemoglobin a1C is 8.4. Patient was out of medications for greater than 1 month. Discussed the importance of taking all medications consistently in order to achieve positive outcomes. Goal a1C is <7.0. Discussed carbohydrate modified diet at length. Patient expressed  understanding.  - Urinalysis Dipstick - HgB A1c - Comprehensive metabolic panel   Follow-up: Return in about 3 months (around 03/03/2020) for diabetes.    [name]  APRN, MSN, FNP-C Patient [facility] [address] [place], [place] [address] [phone]

## 2019-12-05 ENCOUNTER — Telehealth: Payer: Self-pay

## 2019-12-05 LAB — COMPREHENSIVE METABOLIC PANEL
ALT: 15 IU/L (ref 0–44)
AST: 14 IU/L (ref 0–40)
Albumin/Globulin Ratio: 1.9 (ref 1.2–2.2)
Albumin: 4.4 g/dL (ref 3.8–4.9)
Alkaline Phosphatase: 79 IU/L (ref 39–117)
BUN/Creatinine Ratio: 12 (ref 9–20)
BUN: 13 mg/dL (ref 6–24)
Bilirubin Total: 0.3 mg/dL (ref 0.0–1.2)
CO2: 21 mmol/L (ref 20–29)
Calcium: 9.4 mg/dL (ref 8.7–10.2)
Chloride: 108 mmol/L — ABNORMAL HIGH (ref 96–106)
Creatinine, Ser: 1.1 mg/dL (ref 0.76–1.27)
GFR calc Af Amer: 88 mL/min/{1.73_m2} (ref >59)
GFR calc non Af Amer: 76 mL/min/{1.73_m2} (ref >59)
Globulin, Total: 2.3 g/dL (ref 1.5–4.5)
Glucose: 126 mg/dL — ABNORMAL HIGH (ref 65–99)
Potassium: 4 mmol/L (ref 3.5–5.2)
Sodium: 145 mmol/L — ABNORMAL HIGH (ref 134–144)
Total Protein: 6.7 g/dL (ref 6.0–8.5)

## 2019-12-05 NOTE — Telephone Encounter (Signed)
Called, no answer. Left a message to call back.

## 2019-12-05 NOTE — Telephone Encounter (Signed)
-----   Message from Dorena Dew, [place] sent at 12/05/2019  6:21 AM EST ----- Regarding: lab results Please inform patient that labs are within a normal range. His A1C goal is less than 7. Fasting blood sugar  Upon awakening goal is between 110-140.  LDL  (bad cholesterol goal is less than 100  Recommend a lowfat, low carbohydrate diet divided over 5-6 small meals, increase water intake to 6-8 glasses, and 150 minutes per week of cardiovascular exercise.  Take your medications as prescribed Make sure that you are familiar with each one of your medications and what they are used to treat.  If you are unsure of medications, please bring to follow up Please keep your scheduled follow up appointment.    [name]  APRN, MSN, FNP-C Patient [facility] [address] [place], [place] [address] [phone]

## 2019-12-06 NOTE — Telephone Encounter (Signed)
Called, spoke with patient.  Advised that labs are within normal range except for HGBA1C which the goal is to get under 7.0. Recommended that he eat a lowfat, low carb diet over 5 to 6 small meals daily, asked that he increase water intake to 6 to 8 glasses daily, and to exercise 150 minutes weekly of cardio. Asked that he take medications as prescribed and to make sure he is familiar with medications. Asked that he keep next scheduled appointment and bring medications at that time. Thanks!

## 2019-12-24 MED FILL — IBUPROFEN 800 MG TABLET: 800 | 10 days supply | Qty: 30 | Fill #1

## 2020-01-25 MED FILL — IBUPROFEN 800 MG TABLET: 800 | 10 days supply | Qty: 30 | Fill #2

## 2020-01-25 MED FILL — LISINOPRIL 20 MG TABLET: 20 | 90 days supply | Qty: 90 | Fill #1

## 2020-01-25 MED FILL — OMEPRAZOLE DR 40 MG CAPSULE: 40 | 30 days supply | Qty: 30 | Fill #1

## 2020-01-25 MED FILL — BUDESONIDE 3 MG CAP: 3 | 30 days supply | Qty: 90 | Fill #2

## 2020-02-05 MED FILL — hydrOXYzine HCL 10 MG TABS: 10 | 30 days supply | Qty: 90 | Fill #1

## 2020-02-05 MED FILL — BUDESONIDE 3 MG CAP: 3 | 30 days supply | Qty: 90 | Fill #2

## 2020-02-18 ENCOUNTER — Telehealth: Payer: Self-pay | Admitting: Gastroenterology

## 2020-02-18 ENCOUNTER — Emergency Department (HOSPITAL_COMMUNITY): Payer: Medicare HMO

## 2020-02-18 ENCOUNTER — Emergency Department (HOSPITAL_COMMUNITY)
Admission: EM | Admit: 2020-02-18 | Discharge: 2020-02-18 | Disposition: A | Discharge status: Home / Self Care | Payer: Medicare HMO | Source: Home / Self Care | Attending: Emergency Medicine | Admitting: Emergency Medicine

## 2020-02-18 ENCOUNTER — Other Ambulatory Visit: Payer: Self-pay

## 2020-02-18 ENCOUNTER — Encounter (HOSPITAL_COMMUNITY): Payer: Self-pay | Admitting: Emergency Medicine

## 2020-02-18 ENCOUNTER — Other Ambulatory Visit: Payer: Medicaid Other

## 2020-02-18 DIAGNOSIS — E785 Hyperlipidemia, unspecified: Secondary | ICD-10-CM | POA: Diagnosis not present

## 2020-02-18 DIAGNOSIS — R197 Diarrhea, unspecified: Secondary | ICD-10-CM | POA: Diagnosis not present

## 2020-02-18 DIAGNOSIS — Z833 Family history of diabetes mellitus: Secondary | ICD-10-CM | POA: Diagnosis not present

## 2020-02-18 DIAGNOSIS — I10 Essential (primary) hypertension: Secondary | ICD-10-CM | POA: Insufficient documentation

## 2020-02-18 DIAGNOSIS — Z20822 Contact with and (suspected) exposure to covid-19: Secondary | ICD-10-CM | POA: Diagnosis not present

## 2020-02-18 DIAGNOSIS — R7982 Elevated C-reactive protein (CRP): Secondary | ICD-10-CM | POA: Diagnosis present

## 2020-02-18 DIAGNOSIS — Z7984 Long term (current) use of oral hypoglycemic drugs: Secondary | ICD-10-CM | POA: Diagnosis not present

## 2020-02-18 DIAGNOSIS — Z800 Family history of malignant neoplasm of digestive organs: Secondary | ICD-10-CM | POA: Diagnosis not present

## 2020-02-18 DIAGNOSIS — T380X5A Adverse effect of glucocorticoids and synthetic analogues, initial encounter: Secondary | ICD-10-CM | POA: Diagnosis not present

## 2020-02-18 DIAGNOSIS — K50912 Crohn's disease, unspecified, with intestinal obstruction: Secondary | ICD-10-CM | POA: Diagnosis not present

## 2020-02-18 DIAGNOSIS — K5090 Crohn's disease, unspecified, without complications: Secondary | ICD-10-CM | POA: Insufficient documentation

## 2020-02-18 DIAGNOSIS — R1084 Generalized abdominal pain: Secondary | ICD-10-CM | POA: Diagnosis not present

## 2020-02-18 DIAGNOSIS — Z79899 Other long term (current) drug therapy: Secondary | ICD-10-CM | POA: Insufficient documentation

## 2020-02-18 DIAGNOSIS — K567 Ileus, unspecified: Secondary | ICD-10-CM | POA: Diagnosis not present

## 2020-02-18 DIAGNOSIS — Z9049 Acquired absence of other specified parts of digestive tract: Secondary | ICD-10-CM | POA: Diagnosis not present

## 2020-02-18 DIAGNOSIS — R109 Unspecified abdominal pain: Secondary | ICD-10-CM | POA: Diagnosis not present

## 2020-02-18 DIAGNOSIS — K565 Intestinal adhesions [bands], unspecified as to partial versus complete obstruction: Secondary | ICD-10-CM | POA: Diagnosis not present

## 2020-02-18 DIAGNOSIS — E1165 Type 2 diabetes mellitus with hyperglycemia: Secondary | ICD-10-CM | POA: Diagnosis present

## 2020-02-18 DIAGNOSIS — R1010 Upper abdominal pain, unspecified: Secondary | ICD-10-CM

## 2020-02-18 DIAGNOSIS — Z8249 Family history of ischemic heart disease and other diseases of the circulatory system: Secondary | ICD-10-CM | POA: Diagnosis not present

## 2020-02-18 DIAGNOSIS — R140 Abdominal distension (gaseous): Secondary | ICD-10-CM | POA: Diagnosis not present

## 2020-02-18 DIAGNOSIS — Z83438 Family history of other disorder of lipoprotein metabolism and other lipidemia: Secondary | ICD-10-CM | POA: Diagnosis not present

## 2020-02-18 DIAGNOSIS — K5989 Other specified functional intestinal disorders: Secondary | ICD-10-CM | POA: Diagnosis not present

## 2020-02-18 DIAGNOSIS — E876 Hypokalemia: Secondary | ICD-10-CM | POA: Insufficient documentation

## 2020-02-18 DIAGNOSIS — K50012 Crohn's disease of small intestine with intestinal obstruction: Secondary | ICD-10-CM | POA: Diagnosis not present

## 2020-02-18 DIAGNOSIS — K56609 Unspecified intestinal obstruction, unspecified as to partial versus complete obstruction: Secondary | ICD-10-CM | POA: Diagnosis not present

## 2020-02-18 DIAGNOSIS — E119 Type 2 diabetes mellitus without complications: Secondary | ICD-10-CM | POA: Insufficient documentation

## 2020-02-18 DIAGNOSIS — F17210 Nicotine dependence, cigarettes, uncomplicated: Secondary | ICD-10-CM | POA: Diagnosis present

## 2020-02-18 LAB — COMPREHENSIVE METABOLIC PANEL
ALT: 17 U/L (ref 0–44)
AST: 18 U/L (ref 15–41)
Albumin: 3 g/dL — ABNORMAL LOW (ref 3.5–5.0)
Alkaline Phosphatase: 62 U/L (ref 38–126)
Anion gap: 8 (ref 5–15)
BUN: 9 mg/dL (ref 6–20)
CO2: 25 mmol/L (ref 22–32)
Calcium: 8 mg/dL — ABNORMAL LOW (ref 8.9–10.3)
Chloride: 110 mmol/L (ref 98–111)
Creatinine, Ser: 0.98 mg/dL (ref 0.61–1.24)
GFR calc Af Amer: >60 mL/min (ref >60)
GFR calc non Af Amer: >60 mL/min (ref >60)
Glucose, Bld: 129 mg/dL — ABNORMAL HIGH (ref 70–99)
Potassium: 2.8 mmol/L — ABNORMAL LOW (ref 3.5–5.1)
Sodium: 143 mmol/L (ref 135–145)
Total Bilirubin: 0.2 mg/dL — ABNORMAL LOW (ref 0.3–1.2)
Total Protein: 6.2 g/dL — ABNORMAL LOW (ref 6.5–8.1)

## 2020-02-18 LAB — LIPASE, BLOOD: Lipase: 39 U/L (ref 11–51)

## 2020-02-18 LAB — CBC
HCT: 36.3 % — ABNORMAL LOW (ref 39.0–52.0)
Hemoglobin: 11.4 g/dL — ABNORMAL LOW (ref 13.0–17.0)
MCH: 26.8 pg (ref 26.0–34.0)
MCHC: 31.4 g/dL (ref 30.0–36.0)
MCV: 85.4 fL (ref 80.0–100.0)
Platelets: 282 10*3/uL (ref 150–400)
RBC: 4.25 MIL/uL (ref 4.22–5.81)
RDW: 15.9 % — ABNORMAL HIGH (ref 11.5–15.5)
WBC: 9 10*3/uL (ref 4.0–10.5)
nRBC: 0 % (ref 0.0–0.2)

## 2020-02-18 LAB — MAGNESIUM: Magnesium: 1.8 mg/dL (ref 1.7–2.4)

## 2020-02-18 LAB — POC OCCULT BLOOD, ED: Fecal Occult Bld: NEGATIVE

## 2020-02-18 MED ORDER — SODIUM CHLORIDE (PF) 0.9 % IJ SOLN
INTRAMUSCULAR | Status: AC
  Filled 2020-02-18: qty 50

## 2020-02-18 MED ORDER — ONDANSETRON HCL 4 MG/2ML IJ SOLN
4.0000 mg | Freq: Once | INTRAMUSCULAR | Status: AC
  Administered 2020-02-18: 4 mg via INTRAVENOUS
  Filled 2020-02-18: qty 2

## 2020-02-18 MED ORDER — POTASSIUM CHLORIDE CRYS ER 20 MEQ PO TBCR: 20.0000 meq | EXTENDED_RELEASE_TABLET | Freq: Every day | ORAL | 0 refills | Status: DC

## 2020-02-18 MED ORDER — IOHEXOL 300 MG/ML  SOLN
100.0000 mL | Freq: Once | INTRAMUSCULAR | Status: AC | PRN
  Administered 2020-02-18: 100 mL via INTRAVENOUS

## 2020-02-18 MED ORDER — POTASSIUM CHLORIDE 10 MEQ/100ML IV SOLN
10.0000 meq | Freq: Once | INTRAVENOUS | Status: AC
  Administered 2020-02-18: 10 meq via INTRAVENOUS
  Filled 2020-02-18: qty 100

## 2020-02-18 MED ORDER — SODIUM CHLORIDE 0.9 % IV BOLUS
1000.0000 mL | Freq: Once | INTRAVENOUS | Status: AC
  Administered 2020-02-18: 1000 mL via INTRAVENOUS

## 2020-02-18 MED ORDER — POTASSIUM CHLORIDE CRYS ER 20 MEQ PO TBCR
40.0000 meq | EXTENDED_RELEASE_TABLET | Freq: Once | ORAL | Status: AC
  Administered 2020-02-18: 40 meq via ORAL
  Filled 2020-02-18: qty 2

## 2020-02-18 MED ORDER — ONDANSETRON 4 MG PO TBDP: ORAL_TABLET | ORAL | 0 refills | Status: DC

## 2020-02-18 MED ORDER — MORPHINE SULFATE (PF) 4 MG/ML IV SOLN
4.0000 mg | Freq: Once | INTRAVENOUS | Status: AC
  Administered 2020-02-18: 4 mg via INTRAVENOUS
  Filled 2020-02-18: qty 1

## 2020-02-18 NOTE — Discharge Instructions (Signed)
Your scan today shows some possible Crohn's inflammation, and surgical changes.  Does not look like an obstruction, but if you stop being able to pass stool or begin vomiting and have worsening pain please return to the ED.  If you develop fevers you should follow-up as well.  Otherwise call to schedule close follow-up with Dr. Tarri Glenn for discussion of additional medications to manage your Crohn's.  You can use Zofran for nausea.  Start with a liquid diet for the next day or so and then you can advance to bland foods slowly.  Take potassium tablets for the next 3 days.

## 2020-02-18 NOTE — ED Notes (Signed)
An After Visit Summary was printed and given to the patient. Discharge instructions given and no further questions at this time.  

## 2020-02-18 NOTE — Telephone Encounter (Signed)
Patient called stated he is having a lot of stomach issues and requested to speak with Dr. Tarri Glenn

## 2020-02-18 NOTE — Telephone Encounter (Signed)
Pt is currently in the ER.

## 2020-02-18 NOTE — ED Provider Notes (Signed)
[facility] DEPT Provider Note   CSN: [id number] Arrival date & time: 02/18/20  1417     History Chief Complaint  Patient presents with  . Abdominal Pain  . dark stools    Jared [name] is a 54 y.o. male.  Jared [name] is a 54 y.o. male with a history of Crohn's disease, diabetes and hypertension, who presents to the emergency department for evaluation of 1 week of upper abdominal pain.  Patient states it is an intermittent cramping pain that is often worsened after eating.  He states that he has noticed dark black stools.  He has not been having diarrhea.  He has not noticed any bright red blood in his stool.  Reports he is and occasionally had some rectal bleeding associated with his Crohn's but he has never had dark black stools like this.  He is not on any blood thinners.  States he is currently on budesonide for his Crohn's and is followed by Dr. Tarri Glenn with GI.  He has felt nauseated after eating but has not had any episodes of vomiting.  Reports back in 2013 he had a large piece of his small bowel resected, no surgery since then.  Does not use NSAIDs.  No other aggravating or alleviating factors.        Past Medical History:  Diagnosis Date  . Crohn disease ([place]) 2008   with hospital admission in 2011, and 8/12 for flare up and SBO relieved with bowel rest. no suregery, no meds for CD  . Diabetes mellitus type II   . Hypertension     Patient Active Problem List   Diagnosis Date Noted  . Chest pain 11/05/2018  . HTN (hypertension) 11/05/2018  . Cervical spine fracture ([place]) 07/28/2017  . C2 cervical fracture ([place]) 07/28/2017  . Dyslipidemia 09/06/2014  . Left facial swelling 09/06/2014  . Dental caries 09/06/2014  . Crohn's disease ([place]) 06/30/2014  . Tobacco use disorder 06/30/2014  . Type II or unspecified type diabetes mellitus without mention of complication, uncontrolled 06/30/2014  . Crohn disease ([place]) 05/10/2014  .  Intra-abdominal abscess ([place]) 02/13/2012  . S/P exploratory laparotomy with ileocecectomy 02/13/2012  . Hypokalemia 02/13/2012  . Hypomagnesemia 02/13/2012  . CD (Crohn's disease) ([place]) 01/20/2012  . DM (diabetes mellitus) ([place]) 01/20/2012    Past Surgical History:  Procedure Laterality Date  . BOWEL RESECTION  01/25/2012   Procedure: SMALL BOWEL RESECTION;  Surgeon: [name], MD;  Location: [facility];  Service: General;  Laterality: N/A;  . 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 LAPAROTOMY  01/25/2012   Procedure: EXPLORATORY LAPAROTOMY;  Surgeon: [name], MD;  Location: [facility];  Service: General;  Laterality: N/A;  . SCROTAL SURGERY  ~ 33   "took out a pellet"       Family History  Problem Relation Age of Onset  . Cancer Mother        lung  . Diabetes Mother   . Alopecia Mother   . Coronary artery disease Mother   . Diabetes Sister   . Hyperlipidemia Sister   . Hypertension Sister   . Angina Brother   . Hepatitis Brother   . Alcohol abuse Brother   . Diabetes Brother   . Colon cancer Paternal Grandmother   . Esophageal cancer Neg Hx   . Stomach cancer Neg Hx   . Rectal cancer Neg Hx     Social History   Tobacco Use  .  Smoking status: Current Every Day Smoker    Packs/day: 0.25    Years: 30.00    Pack years: 7.50    Types: Cigarettes  . Smokeless tobacco: Former Systems developer    Types: Chew    Quit date: 01/20/1997  Substance Use Topics  . Alcohol use: Not Currently  . Drug use: No    Home Medications Prior to Admission medications   Medication Sig Start Date End Date Taking? Authorizing Provider  atorvastatin (LIPITOR) 10 MG tablet Take 1 tablet (10 mg total) by mouth daily. 08/10/19  Yes Azzie Glatter, FNP  budesonide (ENTOCORT EC) 3 MG 24 hr capsule Take 3 capsules (9 mg total) by mouth daily. 10/04/19  Yes Tresa Garter, MD  hydrOXYzine (ATARAX/VISTARIL) 10 MG tablet Take 1 tablet (10 mg total) by mouth 3 (three) times  daily as needed for anxiety. 05/31/19  Yes Lanae Boast, FNP  ibuprofen (ADVIL) 800 MG tablet TAKE 1 TABLET (800 MG TOTAL) BY MOUTH EVERY 8 (EIGHT) HOURS AS NEEDED. Patient taking differently: Take 800 mg by mouth every 8 (eight) hours as needed for moderate pain.  11/19/19  Yes Jegede, Olugbemiga E, MD  lisinopril (ZESTRIL) 20 MG tablet Take 1 tablet (20 mg total) by mouth daily. 05/31/19 05/30/20 Yes Lanae Boast, FNP  metFORMIN (GLUCOPHAGE) 1000 MG tablet TAKE 1 TABLET BY MOUTH 2 TIMES DAILY WITH A MEAL. 04/16/19  Yes Lanae Boast, FNP  omeprazole (PRILOSEC) 40 MG capsule Take 1 capsule (40 mg total) by mouth daily. 10/04/19  Yes Tresa Garter, MD  saxagliptin HCl (ONGLYZA) 5 MG TABS tablet Take 1 tablet (5 mg total) by mouth daily. 04/18/19  Yes Lanae Boast, FNP  Blood Glucose Monitoring Suppl (TRUE METRIX METER) w/Device KIT 1 each by Does not apply route 4 (four) times daily -  before meals and at bedtime. 06/21/19   Lanae Boast, FNP  chlorhexidine (PERIDEX) 0.12 % solution Use as directed 15 mLs in the mouth or throat 2 (two) times daily. Patient not taking: Reported on 12/04/2019 06/21/19   Lanae Boast, FNP  glucose blood (TRUE METRIX BLOOD GLUCOSE TEST) test strip Use as instructed 07/06/17   Dorena Dew, FNP  ondansetron (ZOFRAN ODT) 4 MG disintegrating tablet 54m ODT q4 hours prn nausea/vomit 02/18/20   FJacqlyn Larsen PA-C  potassium chloride SA (KLOR-CON) 20 MEQ tablet Take 1 tablet (20 mEq total) by mouth daily. 02/18/20   FJacqlyn Larsen PA-C  furosemide (LASIX) 10 MG/ML solution Take by mouth daily.  01/20/12  [provider]  mesalamine (PENTASA) 250 MG CR capsule Take 1,000 mg by mouth 4 (four) times daily.  01/20/12  [provider]  pioglitazone (ACTOS) 15 MG tablet Take by mouth daily.  01/20/12  [provider]    Allergies    Trazodone and nefazodone  Review of Systems   Review of Systems  Constitutional: Negative for chills and fever.    Respiratory: Negative for cough and shortness of breath.   Cardiovascular: Negative for chest pain.  Gastrointestinal: Positive for abdominal pain and nausea. Negative for blood in stool, constipation, diarrhea and vomiting.  Genitourinary: Negative for dysuria and frequency.  Musculoskeletal: Negative for arthralgias and myalgias.  Skin: Negative for color change and rash.  Neurological: Negative for dizziness, syncope and light-headedness.    Physical Exam Updated Vital Signs BP (!) 175/82 (BP Location: Left Arm)   Pulse 86   Temp 98.1 F (36.7 C) (Oral)   Resp 17   Ht 6'  2" (1.88 m)   Wt 97.5 kg   SpO2 98%   BMI 27.60 kg/m   Physical Exam Vitals and nursing note reviewed.  Constitutional:      General: He is not in acute distress.    Appearance: He is well-developed and normal weight. He is not ill-appearing or diaphoretic.  HENT:     Head: Normocephalic and atraumatic.  Eyes:     General:        Right eye: No discharge.        Left eye: No discharge.     Pupils: Pupils are equal, round, and reactive to light.  Cardiovascular:     Rate and Rhythm: Normal rate and regular rhythm.     Heart sounds: Normal heart sounds. No murmur. No friction rub. No gallop.   Pulmonary:     Effort: Pulmonary effort is normal. No respiratory distress.     Breath sounds: Normal breath sounds. No wheezing or rales.     Comments: Respirations equal and unlabored, patient able to speak in full sentences, lungs clear to auscultation bilaterally Abdominal:     General: Bowel sounds are normal. There is no distension.     Palpations: Abdomen is soft. There is no mass.     Tenderness: There is abdominal tenderness in the right upper quadrant, epigastric area and left upper quadrant. There is no guarding.     Comments: Abdomen is soft, slightly distended, bowel sounds present throughout, there is tenderness across the upper abdomen without guarding or peritoneal signs  Musculoskeletal:         General: No deformity.     Cervical back: Neck supple.  Skin:    General: Skin is warm and dry.     Capillary Refill: Capillary refill takes less than 2 seconds.  Neurological:     Mental Status: He is alert.     Coordination: Coordination normal.     Comments: Speech is clear, able to follow commands Moves extremities without ataxia, coordination intact  Psychiatric:        Mood and Affect: Mood normal.        Behavior: Behavior normal.     ED Results / Procedures / Treatments   Labs (all labs ordered are listed, but only abnormal results are displayed) Labs Reviewed  COMPREHENSIVE METABOLIC PANEL - Abnormal; Notable for the following components:      Result Value   Potassium 2.8 (*)    Glucose, Bld 129 (*)    Calcium 8.0 (*)    Total Protein 6.2 (*)    Albumin 3.0 (*)    Total Bilirubin 0.2 (*)    All other components within normal limits  CBC - Abnormal; Notable for the following components:   Hemoglobin 11.4 (*)    HCT 36.3 (*)    RDW 15.9 (*)    All other components within normal limits  LIPASE, BLOOD  MAGNESIUM  POC OCCULT BLOOD, ED    EKG None  Radiology CT Abdomen Pelvis W Contrast  Result Date: 02/18/2020 CLINICAL DATA:  Crohn disease. Upper abdominal pain. Dark stools. Abdominal distension. EXAM: CT ABDOMEN AND PELVIS WITH CONTRAST TECHNIQUE: Multidetector CT imaging of the abdomen and pelvis was performed using the standard protocol following bolus administration of intravenous contrast. CONTRAST:  128m OMNIPAQUE IOHEXOL 300 MG/ML  SOLN COMPARISON:  01/02/2019 CT enterography. FINDINGS: Lower chest: Left greater than right base scarring. Normal heart size without pericardial or pleural effusion. Hepatobiliary: Normal liver. Multiple small gallstones. No acute cholecystitis  or biliary duct dilatation. Pancreas: The pancreatic duct is mildly dilated within the head, including at 6 mm on 30/2. This is similar. Followed to the level of the ampulla, without  obstructive stone or mass identified. No surrounding inflammation. Spleen: Normal in size, without focal abnormality. Adrenals/Urinary Tract: Normal adrenal glands. Left renal too small to characterize lesions. No hydronephrosis. Normal urinary bladder. Stomach/Bowel: Normal stomach, without wall thickening. Surgical changes in the region of the ileocecal junction. Similar appearance of the distal most small bowel, with 2 loops contacting each other and the cecum including on 56/2. The more anterior portion or loop of presumed neo terminal ileum is underdistended but appears thick walled on 58/2. Mid jejunal loops measure up to 3.1 cm including on 58/2. These have solid stool like material within, suggesting decreased motility. No high-grade obstruction identified. Distal transverse duodenum is underdistended on 39/2. The more proximal duodenum is mildly prominent including at 4.2 cm on 44/2. Reproductive: Aortic atherosclerosis. Prominent abdominal retroperitoneal nodes are similar, not pathologic, and likely reactive. Small bowel mesenteric adenopathy is mild, including at 8 mm on 46/2. These nodes are newly enlarged, on the order of 4-5 mm previously. Ileocolic mesenteric adenopathy at 1.3 cm on 57/2 is increased from 1.0 cm on the prior. Normal prostate. Other: No significant free fluid.  No free intraperitoneal air. Musculoskeletal: No acute osseous abnormality. Lower lumbar spondylosis. IMPRESSION: 1. Surgical changes in the region of the ileocecal junction. Two loops of small bowel which are intimately associated with the each other and the adjacent cecum. This appearance is similar to on the prior. Could be postoperative, related to an end to side anastomosis. 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 Although the more anterior portion of the neoterminal ileum is underdistended, felt to be thick walled. Active Crohn disease cannot be excluded. Other areas of  possible low-grade bowel obstruction versus focal ileus within the proximal transverse duodenum and mid jejunum. No convincing evidence of active enteritis in these regions. 2. Developing abdominal adenopathy, likely reactive. 3. Cholelithiasis. Electronically Signed   By: Abigail Miyamoto M.D.   On: 02/18/2020 18:02    Procedures Procedures (including critical care time)  Medications Ordered in ED Medications  sodium chloride (PF) 0.9 % injection (has no administration in time range)  morphine 4 MG/ML injection 4 mg (4 mg Intravenous Given 02/18/20 1512)  ondansetron (ZOFRAN) injection 4 mg (4 mg Intravenous Given 02/18/20 1512)  sodium chloride 0.9 % bolus 1,000 mL (0 mLs Intravenous Stopped 02/18/20 1615)  potassium chloride SA (KLOR-CON) CR tablet 40 mEq (40 mEq Oral Given 02/18/20 1802)  potassium chloride 10 mEq in 100 mL IVPB (0 mEq Intravenous Stopped 02/18/20 1933)  iohexol (OMNIPAQUE) 300 MG/ML solution 100 mL (100 mLs Intravenous Contrast Given 02/18/20 1715)    ED Course  I have reviewed the triage vital signs and the nursing notes.  Pertinent labs & imaging results that were available during my care of the patient were reviewed by me and considered in my medical decision making (see chart for details).    MDM Rules/Calculators/A&P                      54 year old male with history of Crohn's disease presents with a week of upper abdominal pain and dark stools.  He has had some nausea but no associated vomiting.  Reports some looser stools but no persistent diarrhea.  He has not had any fevers.  He is  not on any blood thinners and has never had black stools before has had some intermittent blood in his stool related to his Crohn's previously.  On arrival he is well-appearing and aside from hypertension vitals are normal.  He has dark stools on exam but they are Hemoccult negative.  His abdomen is mildly distended but soft with tenderness but no peritoneal signs across the upper abdomen.  Will  get labs and CT abdomen pelvis.  Lab work is reassuring with no leukocytosis, stable hemoglobin of 11.4.  Hypokalemia of 2.8, glucose of 129, no other significant electrolyte derangements.  Patient given IV and oral potassium replacement.  Magnesium checked and within normal limits.  Lipase normal.  CT shows surgical changes in the region of the ileocecal junction there are 2 small loops of bowel that are very closely associated, this could be an end-to-side anastomosis versus fistula.  There is some evidence of thickening at the neoterminal ileum that could be active Crohn's disease.  There is areas of possible low-grade bowel obstruction versus focal ileus.  Given that patient has not vomiting and passing stool I feel obstruction is less likely.  Feel most of these are likely postsurgical changes.  Patient does have some adenopathy developing in abdomen.  Will discuss with GI.  Case discussed with Dr. Silverio Decamp with [facility] regarding patient's CT scan findings, feels this is likely a combination of active Crohn's and chronic changes from surgery.  Given that patient is well-appearing without leukocytosis and no active vomiting she does not recommend any immediate start to medications but would like to get the patient into be seen in the next few days for potential changes to patient's Crohn's medication regimen.  Without leukocytosis she does not recommend starting antibiotics or additional steroids, wants patient to continue with his home budesonide.  I discussed with his plan with the patient who is now feeling much better.  He has not had any vomiting and has continued to be able to pass stools but has not had persistent watery diarrhea.  Feels comfortable going home and following up with his GI doctor for additional management of his Crohn's.  Will discharge with Zofran as needed for nausea.  Strict return precautions regarding obstruction given encourage patient to start with liquid diet and advance  slowly.  Final Clinical Impression(s) / ED Diagnoses Final diagnoses:  Pain of upper abdomen  Crohn's disease without complication, unspecified gastrointestinal tract location [facility])  Hypokalemia    Rx / DC Orders ED Discharge Orders         Ordered    ondansetron (ZOFRAN ODT) 4 MG disintegrating tablet     02/18/20 1937    potassium chloride SA (KLOR-CON) 20 MEQ tablet  Daily     02/18/20 1937           Janet Berlin 02/18/20 2147    Sherwood Gambler, MD 02/19/20 [phone]

## 2020-02-18 NOTE — ED Triage Notes (Signed)
Pt reports started having dark stools and abd pains last week. Reports is worse after eating. Hx chrons

## 2020-02-19 ENCOUNTER — Emergency Department (HOSPITAL_COMMUNITY): Payer: Medicare HMO

## 2020-02-19 ENCOUNTER — Inpatient Hospital Stay (HOSPITAL_COMMUNITY)
Admission: EM | Admit: 2020-02-19 | Discharge: 2020-02-23 | DRG: 386 | Disposition: A | Discharge status: Home / Self Care | Payer: Medicare HMO | Attending: Internal Medicine | Admitting: Internal Medicine

## 2020-02-19 ENCOUNTER — Telehealth: Payer: Self-pay | Admitting: Gastroenterology

## 2020-02-19 ENCOUNTER — Encounter (HOSPITAL_COMMUNITY): Payer: Self-pay

## 2020-02-19 ENCOUNTER — Other Ambulatory Visit: Payer: Self-pay

## 2020-02-19 DIAGNOSIS — E785 Hyperlipidemia, unspecified: Secondary | ICD-10-CM | POA: Diagnosis present

## 2020-02-19 DIAGNOSIS — K567 Ileus, unspecified: Secondary | ICD-10-CM

## 2020-02-19 DIAGNOSIS — K50912 Crohn's disease, unspecified, with intestinal obstruction: Secondary | ICD-10-CM | POA: Diagnosis present

## 2020-02-19 DIAGNOSIS — I10 Essential (primary) hypertension: Secondary | ICD-10-CM | POA: Diagnosis present

## 2020-02-19 DIAGNOSIS — Z8249 Family history of ischemic heart disease and other diseases of the circulatory system: Secondary | ICD-10-CM

## 2020-02-19 DIAGNOSIS — R7982 Elevated C-reactive protein (CRP): Secondary | ICD-10-CM | POA: Diagnosis present

## 2020-02-19 DIAGNOSIS — Z9049 Acquired absence of other specified parts of digestive tract: Secondary | ICD-10-CM

## 2020-02-19 DIAGNOSIS — K565 Intestinal adhesions [bands], unspecified as to partial versus complete obstruction: Secondary | ICD-10-CM | POA: Diagnosis present

## 2020-02-19 DIAGNOSIS — Z83438 Family history of other disorder of lipoprotein metabolism and other lipidemia: Secondary | ICD-10-CM

## 2020-02-19 DIAGNOSIS — Z20822 Contact with and (suspected) exposure to covid-19: Secondary | ICD-10-CM | POA: Diagnosis present

## 2020-02-19 DIAGNOSIS — F17210 Nicotine dependence, cigarettes, uncomplicated: Secondary | ICD-10-CM | POA: Diagnosis present

## 2020-02-19 DIAGNOSIS — E876 Hypokalemia: Secondary | ICD-10-CM | POA: Diagnosis present

## 2020-02-19 DIAGNOSIS — K56609 Unspecified intestinal obstruction, unspecified as to partial versus complete obstruction: Secondary | ICD-10-CM

## 2020-02-19 DIAGNOSIS — E119 Type 2 diabetes mellitus without complications: Secondary | ICD-10-CM

## 2020-02-19 DIAGNOSIS — Z800 Family history of malignant neoplasm of digestive organs: Secondary | ICD-10-CM

## 2020-02-19 DIAGNOSIS — K50012 Crohn's disease of small intestine with intestinal obstruction: Principal | ICD-10-CM | POA: Diagnosis present

## 2020-02-19 DIAGNOSIS — T380X5A Adverse effect of glucocorticoids and synthetic analogues, initial encounter: Secondary | ICD-10-CM | POA: Diagnosis present

## 2020-02-19 DIAGNOSIS — E1165 Type 2 diabetes mellitus with hyperglycemia: Secondary | ICD-10-CM | POA: Diagnosis present

## 2020-02-19 DIAGNOSIS — Z7984 Long term (current) use of oral hypoglycemic drugs: Secondary | ICD-10-CM

## 2020-02-19 DIAGNOSIS — Z833 Family history of diabetes mellitus: Secondary | ICD-10-CM

## 2020-02-19 LAB — CBC WITH DIFFERENTIAL/PLATELET
Abs Immature Granulocytes: 0.03 10*3/uL (ref 0.00–0.07)
Basophils Absolute: 0 10*3/uL (ref 0.0–0.1)
Basophils Relative: 0 %
Eosinophils Absolute: 0 10*3/uL (ref 0.0–0.5)
Eosinophils Relative: 0 %
HCT: 43 % (ref 39.0–52.0)
Hemoglobin: 13.3 g/dL (ref 13.0–17.0)
Immature Granulocytes: 0 %
Lymphocytes Relative: 11 %
Lymphs Abs: 1.3 10*3/uL (ref 0.7–4.0)
MCH: 26.5 pg (ref 26.0–34.0)
MCHC: 30.9 g/dL (ref 30.0–36.0)
MCV: 85.8 fL (ref 80.0–100.0)
Monocytes Absolute: 1.1 10*3/uL — ABNORMAL HIGH (ref 0.1–1.0)
Monocytes Relative: 9 %
Neutro Abs: 9.4 10*3/uL — ABNORMAL HIGH (ref 1.7–7.7)
Neutrophils Relative %: 80 %
Platelets: 337 10*3/uL (ref 150–400)
RBC: 5.01 MIL/uL (ref 4.22–5.81)
RDW: 16 % — ABNORMAL HIGH (ref 11.5–15.5)
WBC: 11.8 10*3/uL — ABNORMAL HIGH (ref 4.0–10.5)
nRBC: 0 % (ref 0.0–0.2)

## 2020-02-19 MED ORDER — ONDANSETRON HCL 4 MG/2ML IJ SOLN
4.0000 mg | Freq: Once | INTRAMUSCULAR | Status: AC
  Administered 2020-02-19: 4 mg via INTRAVENOUS
  Filled 2020-02-19: qty 2

## 2020-02-19 MED ORDER — MORPHINE SULFATE (PF) 4 MG/ML IV SOLN
4.0000 mg | Freq: Once | INTRAVENOUS | Status: AC
  Administered 2020-02-19: 4 mg via INTRAVENOUS
  Filled 2020-02-19: qty 1

## 2020-02-19 NOTE — Telephone Encounter (Signed)
Pt calling stating he is having abdominal pain that goes across his stomach and then to his RUQ. Reports it comes and goes and at times is a 10 on a scale of 1-10. He is taking budesonide for his crohns disease. Reports his stool is black but he has been taking pepto bismol. He was seen in the ER yesterday and had CT scan done. He was given zofran for nausea. Pt is not having any diarrhea and has not seen blood in his stool. He is calling wanting to know what he can take for the discomfort. Please advise.

## 2020-02-19 NOTE — ED Notes (Signed)
PT aware of urine sample
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
## 2020-02-19 NOTE — Telephone Encounter (Signed)
Pt scheduled to see Dr. Tarri Glenn 02/21/20 at 8:50am. Attempted to call pt back, no answer, unable to leave voicemail.  Spoke with pt and he is aware of appt.

## 2020-02-19 NOTE — Telephone Encounter (Signed)
Pt reported that he is having a Crohn's flare.  Please advise on pain management.

## 2020-02-19 NOTE — ED Triage Notes (Addendum)
Arrived POV from home. Patient reports he was seen here yesterday but he is not any better today. Patient reports mid abdominal pain that radiates to right side; abdomen is rounded, distended, and taut. Patient also reports diarrhea. Patient states he took Gas Ex without relief

## 2020-02-19 NOTE — Telephone Encounter (Signed)
I would like for him to be seen by me or an APP to address his medical management. Thank you.

## 2020-02-20 ENCOUNTER — Encounter (HOSPITAL_COMMUNITY): Payer: Self-pay | Admitting: Family Medicine

## 2020-02-20 ENCOUNTER — Inpatient Hospital Stay (HOSPITAL_COMMUNITY): Payer: Medicare HMO

## 2020-02-20 DIAGNOSIS — K50912 Crohn's disease, unspecified, with intestinal obstruction: Secondary | ICD-10-CM | POA: Diagnosis present

## 2020-02-20 DIAGNOSIS — I10 Essential (primary) hypertension: Secondary | ICD-10-CM

## 2020-02-20 DIAGNOSIS — Z833 Family history of diabetes mellitus: Secondary | ICD-10-CM | POA: Diagnosis not present

## 2020-02-20 DIAGNOSIS — Z20822 Contact with and (suspected) exposure to covid-19: Secondary | ICD-10-CM | POA: Diagnosis present

## 2020-02-20 DIAGNOSIS — Z9049 Acquired absence of other specified parts of digestive tract: Secondary | ICD-10-CM | POA: Diagnosis not present

## 2020-02-20 DIAGNOSIS — K50012 Crohn's disease of small intestine with intestinal obstruction: Secondary | ICD-10-CM | POA: Diagnosis present

## 2020-02-20 DIAGNOSIS — R7982 Elevated C-reactive protein (CRP): Secondary | ICD-10-CM | POA: Diagnosis present

## 2020-02-20 DIAGNOSIS — E119 Type 2 diabetes mellitus without complications: Secondary | ICD-10-CM | POA: Diagnosis not present

## 2020-02-20 DIAGNOSIS — F17210 Nicotine dependence, cigarettes, uncomplicated: Secondary | ICD-10-CM | POA: Diagnosis present

## 2020-02-20 DIAGNOSIS — E876 Hypokalemia: Secondary | ICD-10-CM | POA: Diagnosis present

## 2020-02-20 DIAGNOSIS — T380X5A Adverse effect of glucocorticoids and synthetic analogues, initial encounter: Secondary | ICD-10-CM | POA: Diagnosis present

## 2020-02-20 DIAGNOSIS — Z83438 Family history of other disorder of lipoprotein metabolism and other lipidemia: Secondary | ICD-10-CM | POA: Diagnosis not present

## 2020-02-20 DIAGNOSIS — E785 Hyperlipidemia, unspecified: Secondary | ICD-10-CM | POA: Diagnosis present

## 2020-02-20 DIAGNOSIS — E1165 Type 2 diabetes mellitus with hyperglycemia: Secondary | ICD-10-CM | POA: Diagnosis present

## 2020-02-20 DIAGNOSIS — K565 Intestinal adhesions [bands], unspecified as to partial versus complete obstruction: Secondary | ICD-10-CM | POA: Diagnosis present

## 2020-02-20 DIAGNOSIS — K56609 Unspecified intestinal obstruction, unspecified as to partial versus complete obstruction: Secondary | ICD-10-CM

## 2020-02-20 DIAGNOSIS — K567 Ileus, unspecified: Secondary | ICD-10-CM | POA: Diagnosis present

## 2020-02-20 DIAGNOSIS — Z7984 Long term (current) use of oral hypoglycemic drugs: Secondary | ICD-10-CM | POA: Diagnosis not present

## 2020-02-20 DIAGNOSIS — Z8249 Family history of ischemic heart disease and other diseases of the circulatory system: Secondary | ICD-10-CM | POA: Diagnosis not present

## 2020-02-20 DIAGNOSIS — Z800 Family history of malignant neoplasm of digestive organs: Secondary | ICD-10-CM | POA: Diagnosis not present

## 2020-02-20 LAB — COMPREHENSIVE METABOLIC PANEL
ALT: 15 U/L (ref 0–44)
ALT: 14 U/L (ref 0–44)
AST: 17 U/L (ref 15–41)
AST: 13 U/L — ABNORMAL LOW (ref 15–41)
Albumin: 3.4 g/dL — ABNORMAL LOW (ref 3.5–5.0)
Albumin: 3 g/dL — ABNORMAL LOW (ref 3.5–5.0)
Alkaline Phosphatase: 67 U/L (ref 38–126)
Alkaline Phosphatase: 60 U/L (ref 38–126)
Anion gap: 9 (ref 5–15)
Anion gap: 11 (ref 5–15)
BUN: 9 mg/dL (ref 6–20)
BUN: 9 mg/dL (ref 6–20)
CO2: 26 mmol/L (ref 22–32)
CO2: 23 mmol/L (ref 22–32)
Calcium: 8.6 mg/dL — ABNORMAL LOW (ref 8.9–10.3)
Calcium: 8.2 mg/dL — ABNORMAL LOW (ref 8.9–10.3)
Chloride: 108 mmol/L (ref 98–111)
Chloride: 106 mmol/L (ref 98–111)
Creatinine, Ser: 0.97 mg/dL (ref 0.61–1.24)
Creatinine, Ser: 0.76 mg/dL (ref 0.61–1.24)
GFR calc Af Amer: >60 mL/min (ref >60)
GFR calc Af Amer: >60 mL/min (ref >60)
GFR calc non Af Amer: >60 mL/min (ref >60)
GFR calc non Af Amer: >60 mL/min (ref >60)
Glucose, Bld: 155 mg/dL — ABNORMAL HIGH (ref 70–99)
Glucose, Bld: 174 mg/dL — ABNORMAL HIGH (ref 70–99)
Potassium: 3.5 mmol/L (ref 3.5–5.1)
Potassium: 3.2 mmol/L — ABNORMAL LOW (ref 3.5–5.1)
Sodium: 143 mmol/L (ref 135–145)
Sodium: 140 mmol/L (ref 135–145)
Total Bilirubin: 0.5 mg/dL (ref 0.3–1.2)
Total Bilirubin: 0.7 mg/dL (ref 0.3–1.2)
Total Protein: 7.2 g/dL (ref 6.5–8.1)
Total Protein: 6.4 g/dL — ABNORMAL LOW (ref 6.5–8.1)

## 2020-02-20 LAB — HIV ANTIBODY (ROUTINE TESTING W REFLEX): HIV Screen 4th Generation wRfx: NONREACTIVE

## 2020-02-20 LAB — CBC WITH DIFFERENTIAL/PLATELET
Abs Immature Granulocytes: 0.03 10*3/uL (ref 0.00–0.07)
Basophils Absolute: 0 10*3/uL (ref 0.0–0.1)
Basophils Relative: 0 %
Eosinophils Absolute: 0 10*3/uL (ref 0.0–0.5)
Eosinophils Relative: 0 %
HCT: 40.2 % (ref 39.0–52.0)
Hemoglobin: 12.2 g/dL — ABNORMAL LOW (ref 13.0–17.0)
Immature Granulocytes: 0 %
Lymphocytes Relative: 5 %
Lymphs Abs: 0.5 10*3/uL — ABNORMAL LOW (ref 0.7–4.0)
MCH: 26.1 pg (ref 26.0–34.0)
MCHC: 30.3 g/dL (ref 30.0–36.0)
MCV: 86.1 fL (ref 80.0–100.0)
Monocytes Absolute: 0.2 10*3/uL (ref 0.1–1.0)
Monocytes Relative: 2 %
Neutro Abs: 9.4 10*3/uL — ABNORMAL HIGH (ref 1.7–7.7)
Neutrophils Relative %: 93 %
Platelets: 301 10*3/uL (ref 150–400)
RBC: 4.67 MIL/uL (ref 4.22–5.81)
RDW: 15.9 % — ABNORMAL HIGH (ref 11.5–15.5)
WBC: 10.1 10*3/uL (ref 4.0–10.5)
nRBC: 0 % (ref 0.0–0.2)

## 2020-02-20 LAB — URINALYSIS, ROUTINE W REFLEX MICROSCOPIC
Bacteria, UA: NONE SEEN
Bilirubin Urine: NEGATIVE
Glucose, UA: 50 mg/dL — AB
Ketones, ur: 20 mg/dL — AB
Leukocytes,Ua: NEGATIVE
Nitrite: NEGATIVE
Protein, ur: 100 mg/dL — AB
Specific Gravity, Urine: 1.026 (ref 1.005–1.030)
pH: 6 (ref 5.0–8.0)

## 2020-02-20 LAB — GLUCOSE, CAPILLARY
Glucose-Capillary: 110 mg/dL — ABNORMAL HIGH (ref 70–99)
Glucose-Capillary: 111 mg/dL — ABNORMAL HIGH (ref 70–99)
Glucose-Capillary: 126 mg/dL — ABNORMAL HIGH (ref 70–99)
Glucose-Capillary: 153 mg/dL — ABNORMAL HIGH (ref 70–99)
Glucose-Capillary: 155 mg/dL — ABNORMAL HIGH (ref 70–99)
Glucose-Capillary: 168 mg/dL — ABNORMAL HIGH (ref 70–99)
Glucose-Capillary: 97 mg/dL (ref 70–99)

## 2020-02-20 LAB — MAGNESIUM: Magnesium: 1.8 mg/dL (ref 1.7–2.4)

## 2020-02-20 LAB — SEDIMENTATION RATE: Sed Rate: 35 mm/hr — ABNORMAL HIGH (ref 0–16)

## 2020-02-20 LAB — LACTIC ACID, PLASMA
Lactic Acid, Venous: 1 mmol/L (ref 0.5–1.9)
Lactic Acid, Venous: 0.9 mmol/L (ref 0.5–1.9)

## 2020-02-20 LAB — LIPASE, BLOOD: Lipase: 29 U/L (ref 11–51)

## 2020-02-20 LAB — SARS CORONAVIRUS 2 (TAT 6-24 HRS): SARS Coronavirus 2: NEGATIVE

## 2020-02-20 LAB — C-REACTIVE PROTEIN: CRP: 6.7 mg/dL — ABNORMAL HIGH (ref <1.0)

## 2020-02-20 MED ORDER — ACETAMINOPHEN 650 MG RE SUPP: 650.0000 mg | Freq: Four times a day (QID) | RECTAL | Status: DC | PRN

## 2020-02-20 MED ORDER — MORPHINE SULFATE (PF) 2 MG/ML IV SOLN
2.0000 mg | INTRAVENOUS | Status: DC | PRN
  Administered 2020-02-20 – 2020-02-21 (×6): 2 mg via INTRAVENOUS
  Administered 2020-02-21: 4 mg via INTRAVENOUS
  Administered 2020-02-21 – 2020-02-22 (×2): 2 mg via INTRAVENOUS
  Administered 2020-02-22 – 2020-02-23 (×2): 4 mg via INTRAVENOUS
  Filled 2020-02-20: qty 1
  Filled 2020-02-20 (×3): qty 2
  Filled 2020-02-20 (×7): qty 1

## 2020-02-20 MED ORDER — SODIUM CHLORIDE 0.9 % IV BOLUS
1000.0000 mL | Freq: Once | INTRAVENOUS | Status: AC
  Administered 2020-02-20: 1000 mL via INTRAVENOUS

## 2020-02-20 MED ORDER — ACETAMINOPHEN 325 MG PO TABS: 650.0000 mg | ORAL_TABLET | Freq: Four times a day (QID) | ORAL | Status: DC | PRN

## 2020-02-20 MED ORDER — LACTATED RINGERS IV SOLN: INTRAVENOUS | Status: DC

## 2020-02-20 MED ORDER — MAGNESIUM SULFATE 2 GM/50ML IV SOLN
2.0000 g | Freq: Once | INTRAVENOUS | Status: AC
  Administered 2020-02-20: 2 g via INTRAVENOUS
  Filled 2020-02-20: qty 50

## 2020-02-20 MED ORDER — SODIUM CHLORIDE 0.9 % IV SOLN: INTRAVENOUS | Status: DC

## 2020-02-20 MED ORDER — PHENOL 1.4 % MT LIQD
1.0000 | OROMUCOSAL | Status: DC | PRN
  Filled 2020-02-20: qty 177

## 2020-02-20 MED ORDER — METHYLPREDNISOLONE SODIUM SUCC 125 MG IJ SOLR
60.0000 mg | Freq: Every day | INTRAMUSCULAR | Status: DC
  Administered 2020-02-20 – 2020-02-22 (×4): 60 mg via INTRAVENOUS
  Filled 2020-02-20 (×4): qty 2

## 2020-02-20 MED ORDER — HYDROMORPHONE HCL 1 MG/ML IJ SOLN
1.0000 mg | Freq: Once | INTRAMUSCULAR | Status: AC
  Administered 2020-02-20: 1 mg via INTRAVENOUS
  Filled 2020-02-20: qty 1

## 2020-02-20 MED ORDER — ENOXAPARIN SODIUM 40 MG/0.4ML ~~LOC~~ SOLN
40.0000 mg | SUBCUTANEOUS | Status: DC
  Administered 2020-02-20 – 2020-02-23 (×4): 40 mg via SUBCUTANEOUS
  Filled 2020-02-20 (×4): qty 0.4

## 2020-02-20 MED ORDER — INSULIN ASPART 100 UNIT/ML ~~LOC~~ SOLN
0.0000 [IU] | SUBCUTANEOUS | Status: DC
  Administered 2020-02-20: 1 [IU] via SUBCUTANEOUS
  Administered 2020-02-20 – 2020-02-21 (×5): 2 [IU] via SUBCUTANEOUS
  Administered 2020-02-22: 1 [IU] via SUBCUTANEOUS
  Administered 2020-02-22: 3 [IU] via SUBCUTANEOUS
  Administered 2020-02-22: 2 [IU] via SUBCUTANEOUS
  Administered 2020-02-22: 1 [IU] via SUBCUTANEOUS
  Administered 2020-02-22: 2 [IU] via SUBCUTANEOUS
  Administered 2020-02-23: 5 [IU] via SUBCUTANEOUS
  Administered 2020-02-23: 1 [IU] via SUBCUTANEOUS
  Administered 2020-02-23: 2 [IU] via SUBCUTANEOUS

## 2020-02-20 MED ORDER — LABETALOL HCL 5 MG/ML IV SOLN
10.0000 mg | INTRAVENOUS | Status: DC | PRN
  Administered 2020-02-20: 10 mg via INTRAVENOUS
  Filled 2020-02-20 (×2): qty 4

## 2020-02-20 MED ORDER — POTASSIUM CHLORIDE 10 MEQ/100ML IV SOLN
10.0000 meq | INTRAVENOUS | Status: AC
  Administered 2020-02-20 – 2020-02-21 (×6): 10 meq via INTRAVENOUS
  Filled 2020-02-20 (×6): qty 100

## 2020-02-20 MED ORDER — ONDANSETRON HCL 4 MG/2ML IJ SOLN: 4.0000 mg | Freq: Four times a day (QID) | INTRAMUSCULAR | Status: DC | PRN

## 2020-02-20 MED ORDER — PANTOPRAZOLE SODIUM 40 MG IV SOLR
40.0000 mg | INTRAVENOUS | Status: DC
  Administered 2020-02-20 – 2020-02-22 (×3): 40 mg via INTRAVENOUS
  Filled 2020-02-20 (×3): qty 40

## 2020-02-20 MED ORDER — LIDOCAINE HCL URETHRAL/MUCOSAL 2 % EX GEL
1.0000 "application " | Freq: Once | CUTANEOUS | Status: AC
  Administered 2020-02-20: 1 via TOPICAL
  Filled 2020-02-20: qty 30

## 2020-02-20 MED ORDER — BENZOCAINE 20 % MT AERO
INHALATION_SPRAY | Freq: Once | OROMUCOSAL | Status: DC
  Filled 2020-02-20: qty 57
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
## 2020-02-20 NOTE — Progress Notes (Signed)
PROGRESS NOTE    Jared [name]  WNI:[id number] DOB: 19-Nov-1965 DOA: 02/19/2020 PCP: Tresa Garter, MD    Brief Narrative:  Patient was admitted to the hospital with a working diagnosis of Crohn's flare without obstruction.  54 year old male who presented with abdominal pain, distention, nausea and loose stools.  He does have significant past medical history for type 2 diabetes mellitus, hypertension, and Crohn's disease.  Patient has been taking Entocort at home for Crohn's disease.  Reported 7 days of nausea and abdominal pain, with worsening symptoms over the last 24 hours.  On his initial physical examination blood pressure 195/91, pulse rate 83, respirate 22, oxygen saturation 98%, his lungs are clear to auscultation bilaterally, heart is also present rhythmic, abdomen was soft, tender without rebound or guarding, increased bowel sounds, no lower extremity edema.  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    Assessment & Plan:   Principal Problem:   Acute Crohn's disease with intestinal obstruction ([place]) Active Problems:   Diabetes mellitus type II, non insulin dependent ([facility])   HTN (hypertension)   1. Acute Crohn's colitis. Patient continue to have abdominal pain, abdomen is very distended and tympanic. Positive bowel sounds and patient passing gas. NG tube to low intermittent suction. Wbc 10,1.   Patient continue to be at high risk for worsening colitis.   2. Hypokalemia/ hypomagnesemia. K down to 3,2 with Mg at 1,8, with a preserved renal function serum cr at 0,76. Will continue K correction with IV kcl, and Mg sulfate, follow electrolytes in am. Continue hydration with balance electrolyte solutions.   3. T2DM. Fasting glucose 174 this am, will continue sliding scale for glucose cover and monitoring. Patient is NPO.   4. HTN. Will continue blood pressure monitoring, holding bp meds for now,.    DVT  prophylaxis: enoxaparin   Code Status:  full Family Communication: no family at the bedside  Disposition Plan/ discharge barriers: patient from home, barrier for dc acute colitis, patient npo, needing IV medications.    Consultants:   GI    Subjective: Patient continue to have abdominal pain an distention, ng tube in place, patient has been. No nausea or vomiting. Positive flatus but no bowel movement.   Objective: Vitals:   02/20/20 0330 02/20/20 0409 02/20/20 0800 02/20/20 0906  BP: (!) 164/94 (!) 163/87 (!) 159/86 (!) 159/86  Pulse: 81 97 76 76  Resp:  16  18  Temp:  98.3 F (36.8 C) 97.9 F (36.6 C) 97.9 F (36.6 C)  TempSrc:  Oral Oral Oral  SpO2: 93% 95% 96% 96%  Weight:  98.9 kg    Height:  6' 2"  (1.88 m)      Intake/Output Summary (Last 24 hours) at 02/20/2020 1325 Last data filed at 02/20/2020 1000 Gross per 24 hour  Intake 86.05 ml  Output 600 ml  Net -513.95 ml   Filed Weights   02/20/20 0409  Weight: 98.9 kg    Examination:   General: Not in pain or dyspnea, deconditioned and ill looking appearing  Neurology: Awake and alert, non focal  E ENT: mild pallor, no icterus, oral mucosa dry, NG tube is in place/ dark fluid drainage.  Cardiovascular: No JVD. S1-S2 present, rhythmic, no gallops, rubs, or murmurs. No lower extremity edema. Pulmonary: positive breath sounds bilaterally, adequate air movement, no wheezing, rhonchi or rales. Gastrointestinal. Abdomen with significant distention, tympanic with no organomegaly,  no rebound or guarding. Positive  bowel sounds.  Skin. No rashes Musculoskeletal: no joint deformities     Data Reviewed: I have personally reviewed following labs and imaging studies  CBC: Recent Labs  Lab 02/18/20 1427 02/19/20 2300 02/20/20 0505  WBC 9.0 11.8* 10.1  NEUTROABS  --  9.4* 9.4*  HGB 11.4* 13.3 12.2*  HCT 36.3* 43.0 40.2  MCV 85.4 85.8 86.1  PLT 282 337 676   Basic Metabolic Panel: Recent Labs  Lab 02/18/20 1427  02/19/20 2300 02/20/20 0505  NA 143 143 140  K 2.8* 3.5 3.2*  CL 110 108 106  CO2 25 26 23   GLUCOSE 129* 155* 174*  BUN 9 9 9   CREATININE 0.98 0.97 0.76  CALCIUM 8.0* 8.6* 8.2*  MG 1.8  --  1.8   GFR: Estimated Creatinine Clearance: 132.7 mL/min (by C-G formula based on SCr of 0.76 mg/dL). Liver Function Tests: Recent Labs  Lab 02/18/20 1427 02/19/20 2300 02/20/20 0505  AST 18 17 13*  ALT 17 15 14   ALKPHOS 62 67 60  BILITOT 0.2* 0.5 0.7  PROT 6.2* 7.2 6.4*  ALBUMIN 3.0* 3.4* 3.0*   Recent Labs  Lab 02/18/20 1515 02/19/20 2300  LIPASE 39 29   No results for input(s): AMMONIA in the last 168 hours. Coagulation Profile: No results for input(s): INR, PROTIME in the last 168 hours. Cardiac Enzymes: No results for input(s): CKTOTAL, CKMB, CKMBINDEX, TROPONINI in the last 168 hours. BNP (last 3 results) No results for input(s): PROBNP in the last 8760 hours. HbA1C: No results for input(s): HGBA1C in the last 72 hours. CBG: Recent Labs  Lab 02/20/20 0414 02/20/20 0739 02/20/20 1119  GLUCAP 155* 168* 153*   Lipid Profile: No results for input(s): CHOL, HDL, LDLCALC, TRIG, CHOLHDL, LDLDIRECT in the last 72 hours. Thyroid Function Tests: No results for input(s): TSH, T4TOTAL, FREET4, T3FREE, THYROIDAB in the last 72 hours. Anemia Panel: No results for input(s): VITAMINB12, FOLATE, FERRITIN, TIBC, IRON, RETICCTPCT in the last 72 hours.    Radiology Studies: I have reviewed all of the imaging during this hospital visit personally     Scheduled Meds: . Benzocaine   Mouth/Throat Once  . enoxaparin (LOVENOX) injection  40 mg Subcutaneous Q24H  . insulin aspart  0-9 Units Subcutaneous Q4H  . methylPREDNISolone (SOLU-MEDROL) injection  60 mg Intravenous QHS   Continuous Infusions: . sodium chloride 90 mL/hr at 02/20/20 0500     LOS: 0 days        Doreene Forrey Gerome Apley, MD

## 2020-02-20 NOTE — ED Notes (Signed)
Patient was stuck 4 times for IV prior to the 20 gauge iv in right wrist placed by charge RN. Attempted higher in both arms and unsuccessful. Lactic acid drawn off peripheral only gave 1 cc blood. Will stick again in lab deems insufficient

## 2020-02-21 ENCOUNTER — Inpatient Hospital Stay (HOSPITAL_COMMUNITY): Payer: Medicare HMO

## 2020-02-21 ENCOUNTER — Ambulatory Visit: Payer: Medicare HMO | Admitting: Gastroenterology

## 2020-02-21 DIAGNOSIS — K567 Ileus, unspecified: Secondary | ICD-10-CM

## 2020-02-21 LAB — CBC WITH DIFFERENTIAL/PLATELET
Abs Immature Granulocytes: 0.03 10*3/uL (ref 0.00–0.07)
Basophils Absolute: 0 10*3/uL (ref 0.0–0.1)
Basophils Relative: 0 %
Eosinophils Absolute: 0 10*3/uL (ref 0.0–0.5)
Eosinophils Relative: 0 %
HCT: 35.8 % — ABNORMAL LOW (ref 39.0–52.0)
Hemoglobin: 11.1 g/dL — ABNORMAL LOW (ref 13.0–17.0)
Immature Granulocytes: 0 %
Lymphocytes Relative: 9 %
Lymphs Abs: 0.7 10*3/uL (ref 0.7–4.0)
MCH: 26.3 pg (ref 26.0–34.0)
MCHC: 31 g/dL (ref 30.0–36.0)
MCV: 84.8 fL (ref 80.0–100.0)
Monocytes Absolute: 0.2 10*3/uL (ref 0.1–1.0)
Monocytes Relative: 2 %
Neutro Abs: 7 10*3/uL (ref 1.7–7.7)
Neutrophils Relative %: 89 %
Platelets: 292 10*3/uL (ref 150–400)
RBC: 4.22 MIL/uL (ref 4.22–5.81)
RDW: 15.8 % — ABNORMAL HIGH (ref 11.5–15.5)
WBC: 7.9 10*3/uL (ref 4.0–10.5)
nRBC: 0 % (ref 0.0–0.2)

## 2020-02-21 LAB — BASIC METABOLIC PANEL
Anion gap: 10 (ref 5–15)
BUN: 11 mg/dL (ref 6–20)
CO2: 23 mmol/L (ref 22–32)
Calcium: 8.1 mg/dL — ABNORMAL LOW (ref 8.9–10.3)
Chloride: 106 mmol/L (ref 98–111)
Creatinine, Ser: 0.8 mg/dL (ref 0.61–1.24)
GFR calc Af Amer: >60 mL/min (ref >60)
GFR calc non Af Amer: >60 mL/min (ref >60)
Glucose, Bld: 164 mg/dL — ABNORMAL HIGH (ref 70–99)
Potassium: 3.9 mmol/L (ref 3.5–5.1)
Sodium: 139 mmol/L (ref 135–145)

## 2020-02-21 LAB — GLUCOSE, CAPILLARY
Glucose-Capillary: 145 mg/dL — ABNORMAL HIGH (ref 70–99)
Glucose-Capillary: 155 mg/dL — ABNORMAL HIGH (ref 70–99)
Glucose-Capillary: 118 mg/dL — ABNORMAL HIGH (ref 70–99)
Glucose-Capillary: 178 mg/dL — ABNORMAL HIGH (ref 70–99)
Glucose-Capillary: 75 mg/dL (ref 70–99)

## 2020-02-21 LAB — MAGNESIUM: Magnesium: 2 mg/dL (ref 1.7–2.4)

## 2020-02-21 LAB — HEMOGLOBIN A1C
Hgb A1c MFr Bld: 8.1 % — ABNORMAL HIGH (ref 4.8–5.6)
Mean Plasma Glucose: 186 mg/dL

## 2020-02-21 MED ORDER — ADULT MULTIVITAMIN W/MINERALS CH
1.0000 | ORAL_TABLET | Freq: Every day | ORAL | Status: DC
  Administered 2020-02-21 – 2020-02-23 (×3): 1 via ORAL
  Filled 2020-02-21 (×3): qty 1

## 2020-02-21 MED ORDER — PRO-STAT SUGAR FREE PO LIQD
30.0000 mL | Freq: Two times a day (BID) | ORAL | Status: DC
  Administered 2020-02-21 – 2020-02-23 (×5): 30 mL via ORAL
  Filled 2020-02-21 (×4): qty 30

## 2020-02-21 MED ORDER — BOOST / RESOURCE BREEZE PO LIQD CUSTOM
1.0000 | Freq: Three times a day (TID) | ORAL | Status: DC
  Administered 2020-02-21 – 2020-02-23 (×6): 1 via ORAL

## 2020-02-21 NOTE — Plan of Care (Signed)

## 2020-02-21 NOTE — Progress Notes (Signed)
Patient ID: Jared [name], male   DOB: Sep 20, 1966, 54 y.o.   MRN: [id number]    Progress Note   Subjective   Day # 2 CC; abdominal distention, small bowel obstruction, Crohn's  Abdominal films yesterday-unchanged appearance of multiple dilated loops of small bowel within the bilateral lower quadrants, air throughout the large bowel, normal in caliber, NG tube tip in the body of the stomach  WBC 7.9, hemoglobin 11.1 Potassium 3.9/glucose 164 CRP elevated at 6.7 Sed rate 35  He says he feels better today, has been passing some flatus and had a bowel movement.  He says his abdomen is significantly less distended.  No current complaint of pain other than from the NG tube.    Objective   Vital signs in last 24 hours: Temp:  [97.7 F (36.5 C)-98.7 F (37.1 C)] 98.7 F (37.1 C) (04/08 0414) Pulse Rate:  [76-79] 77 (04/08 0414) Resp:  [16-18] 18 (04/08 0414) BP: (142-159)/(80-90) 142/84 (04/08 0414) SpO2:  [93 %-98 %] 93 % (04/08 0414) Last BM Date: 02/19/20 General:   African-American male in NAD, NG tube in place Heart:  Regular rate and rhythm; no murmurs Lungs: Respirations even and unlabored, lungs CTA bilaterally Abdomen:  Soft, nontender and nondistended. Normal bowel sounds. Extremities:  Without edema. Neurologic:  Alert and oriented,  grossly normal neurologically. Psych:  Cooperative. Normal mood and affect.  Intake/Output from previous day: 04/07 0701 - 04/08 0700 In: 1933.9 [P.O.:60; I.V.:1473.9; IV Piggyback:400] Out: 2070 [Urine:1000; Emesis/NG output:1070] Intake/Output this shift: No intake/output data recorded.  Lab Results: Recent Labs    02/19/20 2300 02/20/20 0505 02/21/20 0455  WBC 11.8* 10.1 7.9  HGB 13.3 12.2* 11.1*  HCT 43.0 40.2 35.8*  PLT 337 301 292   BMET Recent Labs    02/19/20 2300 02/20/20 0505 02/21/20 0455  NA 143 140 139  K 3.5 3.2* 3.9  CL 108 106 106  CO2 26 23 23   GLUCOSE 155* 174* 164*  BUN 9 9 11   CREATININE 0.97 0.76  0.80  CALCIUM 8.6* 8.2* 8.1*   LFT Recent Labs    02/20/20 0505  PROT 6.4*  ALBUMIN 3.0*  AST 13*  ALT 14  ALKPHOS 60  BILITOT 0.7   PT/INR No results for input(s): LABPROT, INR in the last 72 hours.  Studies/Results: DG Abd 2 Views  Result Date: 02/20/2020 CLINICAL DATA:  Follow-up small bowel obstruction. EXAM: ABDOMEN - 2 VIEW COMPARISON:  February 19, 2020 FINDINGS: A nasogastric tube is seen with its distal tip noted within the body of the stomach. This represents a new finding when compared to the prior study. Multiple dilated loops of small bowel are seen within the bilateral lower quadrants. This is unchanged in appearance when compared to the prior study. Air is seen throughout the large bowel which is normal in caliber. There is no evidence of free air. No radio-opaque calculi or other significant radiographic abnormality is seen. IMPRESSION: Interval nasogastric tube placement positioning, as described above, since the prior study dated February 19, 2020 with stable findings consistent with a partial small-bowel obstruction. Electronically Signed   By: Virgina Norfolk M.D.   On: 02/20/2020 20:24   DG Abdomen Acute W/Chest  Result Date: 02/19/2020 CLINICAL DATA:  Abdominal distention, abdominal pain EXAM: DG ABDOMEN ACUTE W/ 1V CHEST COMPARISON:  05/12/2014 FINDINGS: Areas of scarring in the left mid and lower lung. Heart is normal size. No effusions. Dilated small bowel loops with air-fluid levels throughout the abdomen compatible with small  bowel obstruction. No free air organomegaly. No suspicious calcification. IMPRESSION: Dilated small bowel loops with air-fluid levels compatible with small bowel obstruction. Electronically Signed   By: Rolm Baptise M.D.   On: 02/19/2020 23:49       Assessment / Plan:    #68 54 year old male with known longstanding ileal Crohn's disease status post ileocecal resection 2013, on no therapy for many years who has been on budesonide 9 mg daily over  the past 8 to 9 months.  Admitted currently with acute small bowel obstruction.  Unclear at present whether this is due to active Crohn's with new area of involvement in the mid jejunum versus adhesive disease.   He did not have any evidence of active Crohn's in the duodenum or jejunum on CT enterography done February 2020.  Patient feeling better, and has had a bowel movement which is encouraging.  #2 adult onset diabetes mellitus-on oral agents  Plan; repeat abdominal films this a.m. Clamp NG tube to allow patient to ambulate Continue IV Solu-Medrol 60 mg p.o. daily If abdominal films show improvement, may be able to DC NG tube later today.       Principal Problem:   Acute Crohn's disease with intestinal obstruction ([place]) Active Problems:   Diabetes mellitus type II, non insulin dependent ([facility])   HTN (hypertension)   SBO (small bowel obstruction) ([place])     LOS: 1 day   Matthews Franks PA-C 02/21/2020, 9:01 AM

## 2020-02-21 NOTE — Progress Notes (Signed)
Initial Nutrition Assessment  DOCUMENTATION CODES:   Not applicable  INTERVENTION:  Boost Breeze po TID, each supplement provides 250 kcal and 9 grams of protein  Prostat 30 ml po BID, each supplement provides 100 kcal and 15 grams of protein  MVI with minerals daily  Advance diet as medically feasible  Recommend monitoring magnesium, potassium, and phosphorus daily for at least 3 days, MD to replete as needed, as pt is at risk for refeeding syndrome given poor po and diarrhea 1 week prior to admission.   NUTRITION DIAGNOSIS:   Inadequate oral intake related to acute illness(acute Crohn's disease with intestinal obstruction) as evidenced by (CL diet insufficient to meet needs).    GOAL:   Patient will meet greater than or equal to 90% of their needs    MONITOR:   Diet advancement, Supplement acceptance, PO intake, Labs, Weight trends, I & O's  REASON FOR ASSESSMENT:   Malnutrition Screening Tool    ASSESSMENT:  54 year old male with past medical history of DM2, HTN, Crohn's disease s/p small bowel resection in 2013 admitted on 4/7 for acute Crohn's diease with intestinal obstruction after presenting to ED with 1 week history of progressive severe abdominal pain localized to upper abdomen, abdominal distention, nausea without vomiting and loose stools.   NGT placed in ED and patient has been NPO since admission. Diet advanced today at 1243 to CL, will provide Boost Breeze supplement. Patient awake and alert this morning at RD visit. He reports feeling much better, and stated he would be doing even better once the NG tube was removed, noted 200 ml dark brown liquid in canister. He reports very little po over the past week, but usually he has a great appetite and intake of regular diet at home, recalls bacon, eggs, toast for breakfast and fresh pre made  meals for lunch and dinner (beef, broccoli, rice). Patient has tried Ensure supplements in the past and does not care for  them, amenable to [employer] with diet advancement.   Current wt 217.58 lbs Weight history reviewed, stable 215 -220 lbs over the year.   Per notes: -repeat abdominal films this am -clamp NGT -may be able to d/c NGT if abdominal film shows improvement  Medications reviewed and include: SSI, methylprednisolone IVF: lactated ringers Labs: CBGs 118,155,145,111,110 x 24 hrs  NUTRITION - FOCUSED PHYSICAL EXAM: Completed; no fat or muscle depletions   Diet Order:   Diet Order            Diet clear liquid Room service appropriate? Yes; Fluid consistency: Thin  Diet effective now              EDUCATION NEEDS:   No education needs have been identified at this time  Skin:  Skin Assessment: Reviewed RN Assessment  Last BM:  4/7  Height:   Ht Readings from Last 1 Encounters:  02/20/20 6' 2"  (1.88 m)    Weight:   Wt Readings from Last 1 Encounters:  02/20/20 98.9 kg    Ideal Body Weight:     BMI:  Body mass index is 27.99 kg/m.  Estimated Nutritional Needs:   Kcal:  2100-2300  Protein:  105-115  Fluid:  >/= 2.1 L/day   Lajuan Lines, RD, LDN Clinical Nutrition After Hours/Weekend Pager # in [phone]

## 2020-02-21 NOTE — Progress Notes (Signed)
Pt's NGT is removed per MD order, tolerating clears well. No c/o nausea or vomiting. Pt ambulated twice in the hallway. Pt stated that he had big BM this afternoon.

## 2020-02-21 NOTE — Progress Notes (Signed)
PROGRESS NOTE    Jared [name]  OEH:[id number] DOB: 10/05/1966 DOA: 02/19/2020 PCP: Tresa Garter, MD    Brief Narrative:  Patient was admitted to the hospital with a working diagnosis of Crohn's flare complicated with ileus.  54 year old male who presented with abdominal pain, distention, nausea and loose stools.  He does have significant past medical history for type 2 diabetes mellitus, hypertension, and Crohn's disease.  Patient has been taking Entocort at home for Crohn's disease.  Reported 7 days of nausea and abdominal pain, with worsening symptoms over the last 24 hours.  On his initial physical examination blood pressure 195/91, pulse rate 83, respirate 22, oxygen saturation 98%, his lungs are clear to auscultation bilaterally, heart is also present rhythmic, abdomen was soft, tender without rebound or guarding, increased bowel sounds, no lower extremity edema.  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 Abdominal film with signs of small bowel obstruction.   Patient had NG tube placed to low intermittent suction with improvement of his symptoms. Medical management with systemic steroids and analgesics.   Patient with improved abdominal distention, positive flatus and bowel movement. NG has been removed and diet has been advanced.   Assessment & Plan:   Principal Problem:   Acute Crohn's disease with intestinal obstruction ([place]) Active Problems:   Diabetes mellitus type II, non insulin dependent ([place])   HTN (hypertension)   Ileus ([place])   1. Acute Crohn's colitis/ complicated with ileus.  Improved abdominal distention, follow up abdominal film personally reviewed, noted improvement in gas pattern. Patient with positive flatus and bowel movement. NG tube clamped with no nausea or vomiting.   Will dc NG tube and will advance diet to clears, continue systemic steroids and close inpatient monitoring.  Continue with pantoprazole. Follow with GI recommendations.   Patient continue to be at high risk for worsening colitis.   2. Hypokalemia/ hypomagnesemia. Improved electrolytes with K at 3,9 and Mg at 2,0, renal function stable with serum cr at 0,80 and serum bicarbonate at 23. Will continue to follow up renal function in am. Continue hydration with LR at 75 ml per H.   3. T2DM. Fasting glucose 164 this am, continue sliding scale for glucose cover and monitoring. Advance die to clears.   4. HTN. Continue holding blood pressure medications for now. .    DVT prophylaxis: enoxaparin   Code Status:  full Family Communication: no family at the bedside  Disposition Plan/ discharge barriers: patient from home, barrier for dc acute colitis, patient npo, needing IV medications.    Consultants:   GI     Subjective: Patient is feeling better, decreased abdominal distention but not yet back to baseline, no nausea or vomiting, positive flatus and bowel movement (formed). Out of bed and ambulating.   Objective: Vitals:   02/20/20 0906 02/20/20 1431 02/20/20 2048 02/21/20 0414  BP: (!) 159/86 (!) 153/90 (!) 150/80 (!) 142/84  Pulse: 76 77 79 77  Resp: 18 16 16 18   Temp: 97.9 F (36.6 C) 97.7 F (36.5 C) 98.1 F (36.7 C) 98.7 F (37.1 C)  TempSrc: Oral Oral    SpO2: 96% 94% 98% 93%  Weight:      Height:        Intake/Output Summary (Last 24 hours) at 02/21/2020 1226 Last data filed at 02/21/2020 1145 Gross per 24 hour  Intake 1933.91 ml  Output 2570 ml  Net -636.09 ml   [employer]  02/20/20 0409  Weight: 98.9 kg    Examination:   General: Not in pain or dyspnea, deconditioned  Neurology: Awake and alert, non focal  E ENT: mild pallor, no icterus, oral mucosa moist Cardiovascular: No JVD. S1-S2 present, rhythmic, no gallops, rubs, or murmurs. No lower extremity edema. Pulmonary: positive breath sounds bilaterally, adequate air movement, no wheezing, rhonchi or  rales. Gastrointestinal. Abdomen mild distended, tympanic, with no organomegaly, non tender, no rebound or guarding Skin. No rashes Musculoskeletal: no joint deformities     Data Reviewed: I have personally reviewed following labs and imaging studies  CBC: Recent Labs  Lab 02/18/20 1427 02/19/20 2300 02/20/20 0505 02/21/20 0455  WBC 9.0 11.8* 10.1 7.9  NEUTROABS  --  9.4* 9.4* 7.0  HGB 11.4* 13.3 12.2* 11.1*  HCT 36.3* 43.0 40.2 35.8*  MCV 85.4 85.8 86.1 84.8  PLT 282 337 301 517   Basic Metabolic Panel: Recent Labs  Lab 02/18/20 1427 02/19/20 2300 02/20/20 0505 02/21/20 0455  NA 143 143 140 139  K 2.8* 3.5 3.2* 3.9  CL 110 108 106 106  CO2 25 26 23 23   GLUCOSE 129* 155* 174* 164*  BUN 9 9 9 11   CREATININE 0.98 0.97 0.76 0.80  CALCIUM 8.0* 8.6* 8.2* 8.1*  MG 1.8  --  1.8 2.0   GFR: Estimated Creatinine Clearance: 132.7 mL/min (by C-G formula based on SCr of 0.8 mg/dL). Liver Function Tests: Recent Labs  Lab 02/18/20 1427 02/19/20 2300 02/20/20 0505  AST 18 17 13*  ALT 17 15 14   ALKPHOS 62 67 60  BILITOT 0.2* 0.5 0.7  PROT 6.2* 7.2 6.4*  ALBUMIN 3.0* 3.4* 3.0*   Recent Labs  Lab 02/18/20 1515 02/19/20 2300  LIPASE 39 29   No results for input(s): AMMONIA in the last 168 hours. Coagulation Profile: No results for input(s): INR, PROTIME in the last 168 hours. Cardiac Enzymes: No results for input(s): CKTOTAL, CKMB, CKMBINDEX, TROPONINI in the last 168 hours. BNP (last 3 results) No results for input(s): PROBNP in the last 8760 hours. HbA1C: Recent Labs    02/20/20 0505  HGBA1C 8.1*   CBG: Recent Labs  Lab 02/20/20 2343 02/20/20 2355 02/21/20 0352 02/21/20 0736 02/21/20 1142  GLUCAP 110* 111* 145* 155* 118*   Lipid Profile: No results for input(s): CHOL, HDL, LDLCALC, TRIG, CHOLHDL, LDLDIRECT in the last 72 hours. Thyroid Function Tests: No results for input(s): TSH, T4TOTAL, FREET4, T3FREE, THYROIDAB in the last 72 hours. Anemia  Panel: No results for input(s): VITAMINB12, FOLATE, FERRITIN, TIBC, IRON, RETICCTPCT in the last 72 hours.    Radiology Studies: I have reviewed all of the imaging during this hospital visit personally     Scheduled Meds: . Benzocaine   Mouth/Throat Once  . enoxaparin (LOVENOX) injection  40 mg Subcutaneous Q24H  . insulin aspart  0-9 Units Subcutaneous Q4H  . methylPREDNISolone (SOLU-MEDROL) injection  60 mg Intravenous QHS  . pantoprazole (PROTONIX) IV  40 mg Intravenous Q24H   Continuous Infusions: . lactated ringers 75 mL/hr at 02/21/20 0442     LOS: 1 day        Skyeler Scalese Gerome Apley, MD

## 2020-02-22 LAB — CBC WITH DIFFERENTIAL/PLATELET
Abs Immature Granulocytes: 0.02 10*3/uL (ref 0.00–0.07)
Basophils Absolute: 0 10*3/uL (ref 0.0–0.1)
Basophils Relative: 0 %
Eosinophils Absolute: 0 10*3/uL (ref 0.0–0.5)
Eosinophils Relative: 0 %
HCT: 38 % — ABNORMAL LOW (ref 39.0–52.0)
Hemoglobin: 11.9 g/dL — ABNORMAL LOW (ref 13.0–17.0)
Immature Granulocytes: 0 %
Lymphocytes Relative: 9 %
Lymphs Abs: 0.6 10*3/uL — ABNORMAL LOW (ref 0.7–4.0)
MCH: 26.4 pg (ref 26.0–34.0)
MCHC: 31.3 g/dL (ref 30.0–36.0)
MCV: 84.4 fL (ref 80.0–100.0)
Monocytes Absolute: 0.2 10*3/uL (ref 0.1–1.0)
Monocytes Relative: 3 %
Neutro Abs: 6 10*3/uL (ref 1.7–7.7)
Neutrophils Relative %: 88 %
Platelets: 316 10*3/uL (ref 150–400)
RBC: 4.5 MIL/uL (ref 4.22–5.81)
RDW: 15.6 % — ABNORMAL HIGH (ref 11.5–15.5)
WBC: 6.9 10*3/uL (ref 4.0–10.5)
nRBC: 0 % (ref 0.0–0.2)

## 2020-02-22 LAB — BASIC METABOLIC PANEL
Anion gap: 10 (ref 5–15)
BUN: 13 mg/dL (ref 6–20)
CO2: 25 mmol/L (ref 22–32)
Calcium: 8.4 mg/dL — ABNORMAL LOW (ref 8.9–10.3)
Chloride: 106 mmol/L (ref 98–111)
Creatinine, Ser: 0.81 mg/dL (ref 0.61–1.24)
GFR calc Af Amer: >60 mL/min (ref >60)
GFR calc non Af Amer: >60 mL/min (ref >60)
Glucose, Bld: 193 mg/dL — ABNORMAL HIGH (ref 70–99)
Potassium: 3.8 mmol/L (ref 3.5–5.1)
Sodium: 141 mmol/L (ref 135–145)

## 2020-02-22 LAB — GLUCOSE, CAPILLARY
Glucose-Capillary: 125 mg/dL — ABNORMAL HIGH (ref 70–99)
Glucose-Capillary: 145 mg/dL — ABNORMAL HIGH (ref 70–99)
Glucose-Capillary: 155 mg/dL — ABNORMAL HIGH (ref 70–99)
Glucose-Capillary: 174 mg/dL — ABNORMAL HIGH (ref 70–99)
Glucose-Capillary: 188 mg/dL — ABNORMAL HIGH (ref 70–99)

## 2020-02-22 MED ORDER — SENNOSIDES-DOCUSATE SODIUM 8.6-50 MG PO TABS
1.0000 | ORAL_TABLET | Freq: Once | ORAL | Status: AC
  Administered 2020-02-22: 1 via ORAL
  Filled 2020-02-22: qty 1

## 2020-02-22 NOTE — Care Management Important Message (Signed)
Important Message  Patient Details IM Letter given to [facility] Case Manager to present to the Patient Name: Jared [name] MRN: [id number] Date of Birth: [birth year]   Medicare Important Message Given:  Yes     Kerin Salen 02/22/2020, 11:11 AM

## 2020-02-22 NOTE — Progress Notes (Signed)
Patient ID: Jared [name], male   DOB: Sep 03, 1966, 54 y.o.   MRN: [id number]    Progress Note   Subjective   Day # 3  CC : SBO   KUB yesterday - stable small bowel dilation  NG out yesterday afternoon- Patient tolerating clear liquids without difficulty he does have some mild discomfort after eating, decreasing abdominal distention.  He says he had 3 bowel movements yesterday and has been passing a lot of gas.  WBC 6.9, hemoglobin 11.9 BMET - stable    Objective   Vital signs in last 24 hours: Temp:  [97.7 F (36.5 C)-98.1 F (36.7 C)] 97.7 F (36.5 C) (04/09 0510) Pulse Rate:  [67-81] 67 (04/09 0510) Resp:  [16-18] 16 (04/09 0510) BP: (147-167)/(77-92) 147/81 (04/09 0510) SpO2:  [96 %-97 %] 96 % (04/09 0510) Last BM Date: 02/21/20 General:    African-American male in NAD Heart:  Regular rate and rhythm; no murmurs Lungs: Respirations even and unlabored, lungs CTA bilaterally Abdomen:  Soft, . Normal bowel sounds.  Mild tenderness in the right lower quadrant, less distended, less tender Extremities:  Without edema. Neurologic:  Alert and oriented,  grossly normal neurologically. Psych:  Cooperative. Normal mood and affect.  Intake/Output from previous day: 04/08 0701 - 04/09 0700 In: 1522.5 [P.O.:540; I.V.:982.5] Out: 1950 [Urine:1750; Emesis/NG output:200] Intake/Output this shift: No intake/output data recorded.  Lab Results: Recent Labs    02/20/20 0505 02/21/20 0455 02/22/20 0449  WBC 10.1 7.9 6.9  HGB 12.2* 11.1* 11.9*  HCT 40.2 35.8* 38.0*  PLT 301 292 316   BMET Recent Labs    02/20/20 0505 02/21/20 0455 02/22/20 0449  NA 140 139 141  K 3.2* 3.9 3.8  CL 106 106 106  CO2 23 23 25   GLUCOSE 174* 164* 193*  BUN 9 11 13   CREATININE 0.76 0.80 0.81  CALCIUM 8.2* 8.1* 8.4*   LFT Recent Labs    02/20/20 0505  PROT 6.4*  ALBUMIN 3.0*  AST 13*  ALT 14  ALKPHOS 60  BILITOT 0.7   PT/INR No results for input(s): LABPROT, INR in the last 72  hours.  Studies/Results: DG Abd 2 Views  Result Date: 02/21/2020 CLINICAL DATA:  Diarrhea, generalized abdominal pain. EXAM: ABDOMEN - 2 VIEW COMPARISON:  February 20, 2020. FINDINGS: Nasogastric tube is looped within the proximal stomach. No colonic dilatation is noted. Stable small bowel dilatation is noted in the left side of the abdomen concerning for distal small bowel obstruction or possibly ileus. Stool is noted in the colon. IMPRESSION: Nasogastric tube is looped within the proximal stomach. Stable small bowel dilatation is noted in left side of the abdomen concerning for distal small bowel obstruction or possibly ileus. Electronically Signed   By: Marijo Conception M.D.   On: 02/21/2020 12:52   DG Abd 2 Views  Result Date: 02/20/2020 CLINICAL DATA:  Follow-up small bowel obstruction. EXAM: ABDOMEN - 2 VIEW COMPARISON:  February 19, 2020 FINDINGS: A nasogastric tube is seen with its distal tip noted within the body of the stomach. This represents a new finding when compared to the prior study. Multiple dilated loops of small bowel are seen within the bilateral lower quadrants. This is unchanged in appearance when compared to the prior study. Air is seen throughout the large bowel which is normal in caliber. There is no evidence of free air. No radio-opaque calculi or other significant radiographic abnormality is seen. IMPRESSION: Interval nasogastric tube placement positioning, as described above, since the prior  study dated February 19, 2020 with stable findings consistent with a partial small-bowel obstruction. Electronically Signed   By: Virgina Norfolk M.D.   On: 02/20/2020 20:24       Assessment / Plan:    #70 54 year old African-American male with long history of Crohn's ileitis status post ileocecectomy 2013, on no therapy for many years and on budesonide 9 mg daily over the past several months who was admitted with acute small bowel obstruction  He has a lot of dilated loops in the mid jejunum,  and thickening of the TI.  Unclear whether current obstruction is secondary to adhesive disease or new areas of involvement of the mid small bowel with Crohn's.  He is improving daily with conservative management, tolerating clears and having bowel movements  #2 adult onset diabetes mellitus-on oral agents  Plan; advance to full liquid diet. Continues Solu-Medrol 60 mg IV daily. Check abdominal films in a.m., if continued improvement clinically, he may be able to be discharged in the next 48 hours. He will need outpatient follow-up closely after discharge, and CT enterography, then decisions regarding escalating medical management per Dr. Tarri Glenn.    Principal Problem:   Acute Crohn's disease with intestinal obstruction ([place]) Active Problems:   Diabetes mellitus type II, non insulin dependent ([place])   HTN (hypertension)   Ileus ([place])     LOS: 2 days   Latonyia Lopata EsterwoodPA-C  02/22/2020, 8:56 AM

## 2020-02-22 NOTE — Progress Notes (Signed)
PROGRESS NOTE    Jared [name]  OIN:[id number] DOB: [birth year] DOA: 02/19/2020 PCP: Tresa Garter, MD    Brief Narrative:  Patient was admitted to the hospital with a working diagnosis of Crohn's flare complicated with ileus.  54 year old male who presented with abdominal pain, distention, nausea and loose stools. He does have significant past medical history for type 2 diabetes mellitus, hypertension, and Crohn's disease. 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 Abdominal film with signs of small bowel obstruction.   Patient had NG tube placed to low intermittent suction with improvement of his symptoms. Medical management with systemic steroids and analgesics.   Patient with improved abdominal distention, positive flatus and bowel movement. NG has been removed and diet has been advanced.    Assessment & Plan:   Principal Problem:   Acute Crohn's disease with intestinal obstruction ([place]) Active Problems:   Diabetes mellitus type II, non insulin dependent ([place])   HTN (hypertension)   Ileus ([place])    1. Acute Crohn's colitis/ complicated with ileus.  continue to improve abdominal distention, patient tolerating clears well, with no pain, nausea or vomiting.   Continue with systemic steroids and close inpatient monitoring. On antiacid therapy with pantoprazole. Continue to follow with GI recommendations. Advance diet to full liquids. Follow abdominal films in  am.   Out of bed to chair tid and ambulate in the hallway.   2. Hypokalemia/ hypomagnesemia. Clinically resolved, renal function stable with serum cr at 0,81 with K at 3,8 and serum bicarbonate 25.  3.T2DM. Fasting glucose 193 this am,on  sliding scale for glucose cover and monitoring.   4.HTN. 132/81 mmHg. stable blood pressure off antihypertensive agents.   DVT prophylaxis:enoxaparin Code Status:full Family Communication:no family at the bedside Disposition Plan/ discharge barriers:patient from home, barrier for dc acute colitis, patient npo, needing IV steroids   Consultants:  GI    Nutrition Status: Nutrition Problem: Inadequate oral intake Etiology: acute illness(acute Crohn's disease with intestinal obstruction) Signs/Symptoms: (CL diet insufficient to meet needs) Interventions: Boost Breeze, MVI, Prostat     Consultants:   GI       Subjective: Symptoms continue to improve, his abdomen is less distended and patient continue to have bowel movement yesterday, but not yet today. No nausea or vomiting and tolerating clear liquid diet well.   Objective: Vitals:   02/21/20 0414 02/21/20 1413 02/21/20 2004 02/22/20 0510  BP: (!) 142/84 (!) 148/77 (!) 167/92 (!) 147/81  Pulse: 77 81 75 67  Resp: 18 16 18 16   Temp: 98.7 F (37.1 C) 98.1 F (36.7 C) 98 F (36.7 C) 97.7 F (36.5 C)  TempSrc:   Oral Oral  SpO2: 93% 96% 97% 96%  Weight:      Height:        Intake/Output Summary (Last 24 hours) at 02/22/2020 1028 Last data filed at 02/22/2020 0600 Gross per 24 hour  Intake 1222.5 ml  Output 1050 ml  Net 172.5 ml   Filed Weights   02/20/20 0409  Weight: 98.9 kg    Examination:   General:  Not in pain or dyspnea, deconditioned  Neurology: Awake and alert, non focal  E ENT: mild pallor, no icterus, oral mucosa moist Cardiovascular: No JVD. S1-S2 present, rhythmic, no gallops, rubs, or murmurs. No lower extremity edema. Pulmonary:  positive breath sounds bilaterally, adequate air movement, no wheezing, rhonchi or rales. Gastrointestinal. Abdomen is distended and tympanic with no organomegaly, non tender, no rebound or guarding Skin. No rashes Musculoskeletal: no joint deformities     Data Reviewed: I have personally reviewed following labs and imaging studies  CBC: Recent Labs  Lab 02/18/20 1427 02/19/20 2300 02/20/20 0505 02/21/20 0455 02/22/20 0449  WBC 9.0 11.8* 10.1 7.9 6.9  NEUTROABS  --  9.4* 9.4* 7.0 6.0  HGB 11.4* 13.3 12.2* 11.1* 11.9*  HCT 36.3* 43.0 40.2 35.8* 38.0*  MCV 85.4 85.8 86.1 84.8 84.4  PLT 282 337 301 292 768   Basic Metabolic Panel: Recent Labs  Lab 02/18/20 1427 02/19/20 2300 02/20/20 0505 02/21/20 0455 02/22/20 0449  NA 143 143 140 139 141  K 2.8* 3.5 3.2* 3.9 3.8  CL 110 108 106 106 106  CO2 25 26 23 23 25   GLUCOSE 129* 155* 174* 164* 193*  BUN 9 9 9 11 13   CREATININE 0.98 0.97 0.76 0.80 0.81  CALCIUM 8.0* 8.6* 8.2* 8.1* 8.4*  MG 1.8  --  1.8 2.0  --    GFR: Estimated Creatinine Clearance: 131.1 mL/min (by C-G formula based on SCr of 0.81 mg/dL). Liver Function Tests: Recent Labs  Lab 02/18/20 1427 02/19/20 2300 02/20/20 0505  AST 18 17 13*  ALT 17 15 14   ALKPHOS 62 67 60  BILITOT 0.2* 0.5 0.7  PROT 6.2* 7.2 6.4*  ALBUMIN 3.0* 3.4* 3.0*   Recent Labs  Lab 02/18/20 1515 02/19/20 2300  LIPASE 39 29   No results for input(s): AMMONIA in the last 168 hours. Coagulation Profile: No results for input(s): INR, PROTIME in the last 168 hours. Cardiac Enzymes: No results for input(s): CKTOTAL, CKMB, CKMBINDEX, TROPONINI in the last 168 hours. BNP (last 3 results) No results for input(s): PROBNP in the last 8760 hours. HbA1C: Recent Labs    02/20/20 0505  HGBA1C 8.1*   CBG: Recent Labs  Lab 02/21/20 1607 02/21/20 2001 02/22/20 0038 02/22/20 0359 02/22/20 0734  GLUCAP 178* 75 174* 188* 155*   Lipid Profile: No results for input(s): CHOL, HDL,  LDLCALC, TRIG, CHOLHDL, LDLDIRECT in the last 72 hours. Thyroid Function Tests: No results for input(s): TSH, T4TOTAL, FREET4, T3FREE, THYROIDAB in the last 72 hours. Anemia Panel: No results for input(s): VITAMINB12, FOLATE, FERRITIN, TIBC, IRON, RETICCTPCT in the last 72 hours.    Radiology Studies: I have reviewed all of the imaging during this hospital visit personally     Scheduled Meds: . Benzocaine   Mouth/Throat Once  . enoxaparin (LOVENOX) injection  40 mg Subcutaneous Q24H  . feeding supplement  1 Container Oral TID BM  . feeding supplement (PRO-STAT SUGAR FREE 64)  30 mL Oral BID  . insulin aspart  0-9 Units Subcutaneous Q4H  . methylPREDNISolone (SOLU-MEDROL) injection  60 mg Intravenous QHS  . multivitamin with minerals  1 tablet Oral Daily  . pantoprazole (PROTONIX) IV  40 mg Intravenous Q24H   Continuous Infusions: . lactated ringers Stopped (02/21/20 1554)     LOS: 2 days        Enjoli Tidd Gerome Apley, MD

## 2020-02-23 ENCOUNTER — Inpatient Hospital Stay (HOSPITAL_COMMUNITY): Payer: Medicare HMO

## 2020-02-23 LAB — CBC WITH DIFFERENTIAL/PLATELET
Abs Immature Granulocytes: 0.04 10*3/uL (ref 0.00–0.07)
Basophils Absolute: 0 10*3/uL (ref 0.0–0.1)
Basophils Relative: 0 %
Eosinophils Absolute: 0 10*3/uL (ref 0.0–0.5)
Eosinophils Relative: 0 %
HCT: 39.5 % (ref 39.0–52.0)
Hemoglobin: 12.3 g/dL — ABNORMAL LOW (ref 13.0–17.0)
Immature Granulocytes: 1 %
Lymphocytes Relative: 10 %
Lymphs Abs: 0.7 10*3/uL (ref 0.7–4.0)
MCH: 25.8 pg — ABNORMAL LOW (ref 26.0–34.0)
MCHC: 31.1 g/dL (ref 30.0–36.0)
MCV: 83 fL (ref 80.0–100.0)
Monocytes Absolute: 0.2 10*3/uL (ref 0.1–1.0)
Monocytes Relative: 3 %
Neutro Abs: 6.1 10*3/uL (ref 1.7–7.7)
Neutrophils Relative %: 86 %
Platelets: 332 10*3/uL (ref 150–400)
RBC: 4.76 MIL/uL (ref 4.22–5.81)
RDW: 15.4 % (ref 11.5–15.5)
WBC: 7.1 10*3/uL (ref 4.0–10.5)
nRBC: 0 % (ref 0.0–0.2)

## 2020-02-23 LAB — BASIC METABOLIC PANEL
Anion gap: 12 (ref 5–15)
BUN: 16 mg/dL (ref 6–20)
CO2: 24 mmol/L (ref 22–32)
Calcium: 8.6 mg/dL — ABNORMAL LOW (ref 8.9–10.3)
Chloride: 102 mmol/L (ref 98–111)
Creatinine, Ser: 0.8 mg/dL (ref 0.61–1.24)
GFR calc Af Amer: >60 mL/min (ref >60)
GFR calc non Af Amer: >60 mL/min (ref >60)
Glucose, Bld: 286 mg/dL — ABNORMAL HIGH (ref 70–99)
Potassium: 3.6 mmol/L (ref 3.5–5.1)
Sodium: 138 mmol/L (ref 135–145)

## 2020-02-23 LAB — GLUCOSE, CAPILLARY
Glucose-Capillary: 145 mg/dL — ABNORMAL HIGH (ref 70–99)
Glucose-Capillary: 193 mg/dL — ABNORMAL HIGH (ref 70–99)
Glucose-Capillary: 224 mg/dL — ABNORMAL HIGH (ref 70–99)
Glucose-Capillary: 258 mg/dL — ABNORMAL HIGH (ref 70–99)
Glucose-Capillary: 82 mg/dL (ref 70–99)

## 2020-02-23 MED ORDER — PREDNISONE 20 MG PO TABS
40.0000 mg | ORAL_TABLET | Freq: Every day | ORAL | Status: DC
  Administered 2020-02-23: 40 mg via ORAL
  Filled 2020-02-23: qty 2

## 2020-02-23 MED ORDER — PREDNISONE 20 MG PO TABS: ORAL_TABLET | ORAL | 0 refills | Status: DC

## 2020-02-23 NOTE — Discharge Summary (Signed)
Physician Discharge Summary  Jared [name] KGM:[id number] DOB: [birth year] DOA: 02/19/2020  PCP: Tresa Garter, MD  Admit date: 02/19/2020 Discharge date: 02/23/2020  Admitted From: Home  Disposition:  Home   Recommendations for Outpatient Follow-up and new medication changes:  1. Follow up with Dr. Doreene Burke in 7 days 2. Follow up with Gastroenterology as scheduled 3. Continue prednisone taper from 40 mg daily to 20 mg daily.  4. Continue with soft diet over the next few days.    Home Health: no   Equipment/Devices: no    Discharge Condition: stable  CODE STATUS: full  Diet recommendation: soft diet, diabetic prudent.   Brief/Interim Summary: Patient was admitted to the hospital with a working diagnosis of Crohn's flarecomplicated with ileus.  54 year old male who presented with abdominal pain, distention, nausea and loose stools. He does have significant past medical history for type 2 diabetes mellitus, hypertension, and Crohn's disease. Patient has been taking Entocort (budesonide) at home for Crohn's disease.Reported 7 days of nausea and abdominal pain,with worsening symptoms over the last 24 hours before hospitalization.On his initial physical examination blood pressure 195/91, pulse rate 83, respiratory rate 22, oxygen saturation 98%, his lungs were clear to auscultation bilaterally, heart S1 and S2 present rhythmic, abdomen was soft, but tender to palpation without rebound or guarding, positive increased bowel sounds, no lower extremity edema. Sodium 143, potassium 2.8, chloride 110, bicarb 25, glucose 129, BUN 9, creatinine 0.98, magnesium 1.8, white count 9.0, hemoglobin 11.4, hematocrit 36.3, platelets 282.  SARS COVID-19 negative.  Urinalysis negative for infection. 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  Patient had NG tube placed to  low intermittent suction with improvement of his symptoms. Medical management with systemic steroids and analgesics.  Patient with improved abdominal distention, positive flatus and bowel movement. NG has been removed and diet was advanced with good toleration.   Patient will continue oral prednisone taper and follow with GI as outpatient.   1.  Acute Crohn's colitis, complicated with ileus.. Patient was admitted to the medical ward, he received systemic steroids with methylprednisolone, nothing by mouth, intravenous fluids, and as needed analgesics and antiemetics.  An NG tube was placed for gastric decompression with good toleration.  Patient symptoms improved, his nasogastric tube was removed and his diet was advanced.  He recovered his bowel function, and will follow-up as an outpatient.  Continue prednisone taper and follow-up with gastroenterology.  2.  Hypokalemia/hypomagnesemia.  Related to volume loss from gastrointestinal source.  Patient received potassium chloride and magnesium sulfate, along with intravenous fluids.  His discharge sodium is 138, potassium 3.6, chloride 102, bicarb 24, BUN 16, creatinine 0.90, magnesium 2.0.  3.  Uncontrolled type II that is mellitus, hemoglobin A1c 8.1/ dyslipidemia.  Patient received insulin sliding scale during his hospitalization.  At discharge patient was on his antihyperglycemic agents with metformin and saxagliptin.  Continue prednisone taper.  Continue with atorvastatin.   4.  Hypertension.  He is antihypertensive agents were held during his hospitalization.  At discharge he will resume his regimen with lisinopril.    Discharge Diagnoses:  Principal Problem:   Acute Crohn's disease with intestinal obstruction ([place]) Active Problems:   Diabetes mellitus type II, non insulin dependent ([place])   HTN (hypertension)   Ileus ([place])    Discharge Instructions   Allergies as of 02/23/2020      Reactions   Trazodone And Nefazodone  hallucinations      Medication List    STOP taking these medications   budesonide 3 MG 24 hr capsule Commonly known as: ENTOCORT EC   chlorhexidine 0.12 % solution Commonly known as: Peridex   ibuprofen 800 MG tablet Commonly known as: ADVIL   potassium chloride SA 20 MEQ tablet Commonly known as: KLOR-CON     TAKE these medications   atorvastatin 10 MG tablet Commonly known as: LIPITOR Take 1 tablet (10 mg total) by mouth daily.   glucose blood test strip Commonly known as: True Metrix Blood Glucose Test Use as instructed   hydrOXYzine 10 MG tablet Commonly known as: ATARAX/VISTARIL Take 1 tablet (10 mg total) by mouth 3 (three) times daily as needed for anxiety.   lisinopril 20 MG tablet Commonly known as: ZESTRIL Take 1 tablet (20 mg total) by mouth daily.   metFORMIN 1000 MG tablet Commonly known as: GLUCOPHAGE TAKE 1 TABLET BY MOUTH 2 TIMES DAILY WITH A MEAL. What changed:   how much to take  how to take this  when to take this  additional instructions   omeprazole 40 MG capsule Commonly known as: PRILOSEC Take 1 capsule (40 mg total) by mouth daily.   ondansetron 4 MG disintegrating tablet Commonly known as: Zofran ODT 40m ODT q4 hours prn nausea/vomit What changed:   how much to take  how to take this  when to take this  reasons to take this  additional instructions   predniSONE 20 MG tablet Commonly known as: DELTASONE Take 2 tablets daily for two weeks, then 1 and a half tablet daily for two weeks, then continue with 1 tablet daily.   saxagliptin HCl 5 MG Tabs tablet Commonly known as: Onglyza Take 1 tablet (5 mg total) by mouth daily.   True Metrix Meter w/Device Kit 1 each by Does not apply route 4 (four) times daily -  before meals and at bedtime.       Allergies  Allergen Reactions  . Trazodone And Nefazodone     hallucinations    Consultations:  GI   Procedures/Studies: CT Abdomen Pelvis W Contrast  Result  Date: 02/18/2020 CLINICAL DATA:  Crohn disease. Upper abdominal pain. Dark stools. Abdominal distension. EXAM: CT ABDOMEN AND PELVIS WITH CONTRAST TECHNIQUE: Multidetector CT imaging of the abdomen and pelvis was performed using the standard protocol following bolus administration of intravenous contrast. CONTRAST:  1053mOMNIPAQUE IOHEXOL 300 MG/ML  SOLN COMPARISON:  01/02/2019 CT enterography. FINDINGS: Lower chest: Left greater than right base scarring. Normal heart size without pericardial or pleural effusion. Hepatobiliary: Normal liver. Multiple small gallstones. No acute cholecystitis or biliary duct dilatation. Pancreas: The pancreatic duct is mildly dilated within the head, including at 6 mm on 30/2. This is similar. Followed to the level of the ampulla, without obstructive stone or mass identified. No surrounding inflammation. Spleen: Normal in size, without focal abnormality. Adrenals/Urinary Tract: Normal adrenal glands. Left renal too small to characterize lesions. No hydronephrosis. Normal urinary bladder. Stomach/Bowel: Normal stomach, without wall thickening. Surgical changes in the region of the ileocecal junction. Similar appearance of the distal most small bowel, with 2 loops contacting each other and the cecum including on 56/2. The more anterior portion or loop of presumed neo terminal ileum is underdistended but appears thick walled on 58/2. Mid jejunal loops measure up to 3.1 cm including on 58/2. These have solid stool like material within, suggesting decreased motility. No high-grade obstruction identified. Distal transverse duodenum is underdistended on 39/2.  The more proximal duodenum is mildly prominent including at 4.2 cm on 44/2. Reproductive: Aortic atherosclerosis. Prominent abdominal retroperitoneal nodes are similar, not pathologic, and likely reactive. Small bowel mesenteric adenopathy is mild, including at 8 mm on 46/2. These nodes are newly enlarged, on the order of 4-5 mm  previously. Ileocolic mesenteric adenopathy at 1.3 cm on 57/2 is increased from 1.0 cm on the prior. Normal prostate. Other: No significant free fluid.  No free intraperitoneal air. Musculoskeletal: No acute osseous abnormality. Lower lumbar spondylosis. IMPRESSION: 1. Surgical changes in the region of the ileocecal junction. Two loops of small bowel which are intimately associated with the each other and the adjacent cecum. This appearance is similar to on the prior. Could be postoperative, related to an end to side anastomosis. 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 Although the more anterior portion of the neoterminal ileum is underdistended, felt to be thick walled. Active Crohn disease cannot be excluded. Other areas of possible low-grade bowel obstruction versus focal ileus within the proximal transverse duodenum and mid jejunum. No convincing evidence of active enteritis in these regions. 2. Developing abdominal adenopathy, likely reactive. 3. Cholelithiasis. Electronically Signed   By: Abigail Miyamoto M.D.   On: 02/18/2020 18:02   DG Abd 2 Views  Result Date: 02/21/2020 CLINICAL DATA:  Diarrhea, generalized abdominal pain. EXAM: ABDOMEN - 2 VIEW COMPARISON:  February 20, 2020. FINDINGS: Nasogastric tube is looped within the proximal stomach. No colonic dilatation is noted. Stable small bowel dilatation is noted in the left side of the abdomen concerning for distal small bowel obstruction or possibly ileus. Stool is noted in the colon. IMPRESSION: Nasogastric tube is looped within the proximal stomach. Stable small bowel dilatation is noted in left side of the abdomen concerning for distal small bowel obstruction or possibly ileus. Electronically Signed   By: Marijo Conception M.D.   On: 02/21/2020 12:52   DG Abd 2 Views  Result Date: 02/20/2020 CLINICAL DATA:  Follow-up small bowel obstruction. EXAM: ABDOMEN - 2 VIEW COMPARISON:  February 19, 2020 FINDINGS: A  nasogastric tube is seen with its distal tip noted within the body of the stomach. This represents a new finding when compared to the prior study. Multiple dilated loops of small bowel are seen within the bilateral lower quadrants. This is unchanged in appearance when compared to the prior study. Air is seen throughout the large bowel which is normal in caliber. There is no evidence of free air. No radio-opaque calculi or other significant radiographic abnormality is seen. IMPRESSION: Interval nasogastric tube placement positioning, as described above, since the prior study dated February 19, 2020 with stable findings consistent with a partial small-bowel obstruction. Electronically Signed   By: Virgina Norfolk M.D.   On: 02/20/2020 20:24   DG Abdomen Acute W/Chest  Result Date: 02/19/2020 CLINICAL DATA:  Abdominal distention, abdominal pain EXAM: DG ABDOMEN ACUTE W/ 1V CHEST COMPARISON:  05/12/2014 FINDINGS: Areas of scarring in the left mid and lower lung. Heart is normal size. No effusions. Dilated small bowel loops with air-fluid levels throughout the abdomen compatible with small bowel obstruction. No free air organomegaly. No suspicious calcification. IMPRESSION: Dilated small bowel loops with air-fluid levels compatible with small bowel obstruction. Electronically Signed   By: Rolm Baptise M.D.   On: 02/19/2020 23:49       Subjective: Patient is feeling better, no significant abdominal pain, no nausea or vomiting, patient had bowel movement and is passing  gas.   Discharge Exam: Vitals:   02/22/20 2120 02/23/20 0550  BP: (!) 171/91 (!) 154/85  Pulse: 66 69  Resp: 17 16  Temp: 99 F (37.2 C) 97.8 F (36.6 C)  SpO2: 97% 100%   Vitals:   02/22/20 0510 02/22/20 1323 02/22/20 2120 02/23/20 0550  BP: (!) 147/81 132/81 (!) 171/91 (!) 154/85  Pulse: 67 66 66 69  Resp: _0 Temp: 97.7 F (36.5 C) 98 F (36.7 C) 99 F (37.2 C) 97.8 F (36.6 C)  TempSrc: Oral  Oral Oral  SpO2: 96%  98% 97% 100%  Weight:      Height:        General: Not in pain or dyspnea.  Neurology: Awake and alert, non focal  E ENT: no pallor, no icterus, oral mucosa moist Cardiovascular: No JVD. S1-S2 present, rhythmic, no gallops, rubs, or murmurs. No lower extremity edema. Pulmonary: positive breath sounds bilaterally, adequate air movement, no wheezing, rhonchi or rales. Gastrointestinal. Abdomen soft with no organomegaly, non tender, no rebound or guarding Skin. No rashes Musculoskeletal: no joint deformities   The results of significant diagnostics from this hospitalization (including imaging, microbiology, ancillary and laboratory) are listed below for reference.     Microbiology: Recent Results (from the past 240 hour(s))  SARS CORONAVIRUS 2 (TAT 6-24 HRS) Nasopharyngeal Nasopharyngeal Swab     Status: None   Collection Time: 02/19/20 11:22 PM   Specimen: Nasopharyngeal Swab  Result Value Ref Range Status   SARS Coronavirus 2 NEGATIVE NEGATIVE Final    Comment: (NOTE) SARS-CoV-2 target nucleic acids are NOT DETECTED. The SARS-CoV-2 RNA is generally detectable in upper and lower respiratory specimens during the acute phase of infection. Negative results do not preclude SARS-CoV-2 infection, do not rule out co-infections with other pathogens, and should not be used as the sole basis for treatment or other patient management decisions. Negative results must be combined with clinical observations, patient history, and epidemiological information. The expected result is Negative. Fact Sheet for Patients: SugarRoll.be Fact Sheet for Healthcare Providers: https://www.woods-mathews.com/ This test is not yet approved or cleared by the [place] FDA and  has been authorized for detection and/or diagnosis of SARS-CoV-2 by FDA under an Emergency Use Authorization (EUA). This EUA will remain  in effect (meaning this test can be used) for the  duration of the COVID-19 declaration under Section 56 4(b)(1) of the Act, 21 U.S.C. section 360bbb-3(b)(1), unless the authorization is terminated or revoked sooner. Performed at [facility], [place] [address], [place], [place] [address]      Labs: BNP (last 3 results) No results for input(s): BNP in the last 8760 hours. Basic Metabolic Panel: Recent Labs  Lab 02/18/20 1427 02/18/20 1427 02/19/20 2300 02/20/20 0505 02/21/20 0455 02/22/20 0449 02/23/20 0438  NA 143   < > 143 140 139 141 138  K 2.8*   < > 3.5 3.2* 3.9 3.8 3.6  CL 110   < > 108 106 106 106 102  CO2 25   < > _1 GLUCOSE 129*   < > 155* 174* 164* 193* 286*  BUN 9   < > _2 CREATININE 0.98   < > 0.97 0.76 0.80 0.81 0.80  CALCIUM 8.0*   < > 8.6* 8.2* 8.1* 8.4* 8.6*  MG 1.8  --   --  1.8 2.0  --   --    < > = values  in this interval not displayed.   Liver Function Tests: Recent Labs  Lab 02/18/20 1427 02/19/20 2300 02/20/20 0505  AST 18 17 13*  ALT _0 ALKPHOS 62 67 60  BILITOT 0.2* 0.5 0.7  PROT 6.2* 7.2 6.4*  ALBUMIN 3.0* 3.4* 3.0*   Recent Labs  Lab 02/18/20 1515 02/19/20 2300  LIPASE 39 29   No results for input(s): AMMONIA in the last 168 hours. CBC: Recent Labs  Lab 02/19/20 2300 02/20/20 0505 02/21/20 0455 02/22/20 0449 02/23/20 0438  WBC 11.8* 10.1 7.9 6.9 7.1  NEUTROABS 9.4* 9.4* 7.0 6.0 6.1  HGB 13.3 12.2* 11.1* 11.9* 12.3*  HCT 43.0 40.2 35.8* 38.0* 39.5  MCV 85.8 86.1 84.8 84.4 83.0  PLT 337 301 292 316 332   Cardiac Enzymes: No results for input(s): CKTOTAL, CKMB, CKMBINDEX, TROPONINI in the last 168 hours. BNP: Invalid input(s): POCBNP CBG: Recent Labs  Lab 02/22/20 1636 02/22/20 2039 02/23/20 0002 02/23/20 0421 02/23/20 0745  GLUCAP 145* 125* 193* 258* 145*   D-Dimer No results for input(s): DDIMER in the last 72 hours. Hgb A1c No results for input(s): HGBA1C in the last 72 hours. Lipid Profile No results for input(s): CHOL,  HDL, LDLCALC, TRIG, CHOLHDL, LDLDIRECT in the last 72 hours. Thyroid function studies No results for input(s): TSH, T4TOTAL, T3FREE, THYROIDAB in the last 72 hours.  Invalid input(s): FREET3 Anemia work up No results for input(s): VITAMINB12, FOLATE, FERRITIN, TIBC, IRON, RETICCTPCT in the last 72 hours. Urinalysis    Component Value Date/Time   COLORURINE YELLOW 02/20/2020 0200   APPEARANCEUR CLEAR 02/20/2020 0200   APPEARANCEUR Clear 08/24/2018 1132   LABSPEC 1.026 02/20/2020 0200   PHURINE 6.0 02/20/2020 0200   GLUCOSEU 50 (A) 02/20/2020 0200   HGBUR SMALL (A) 02/20/2020 0200   BILIRUBINUR NEGATIVE 02/20/2020 0200   BILIRUBINUR small 12/04/2019 1032   BILIRUBINUR Negative 08/24/2018 1132   KETONESUR 20 (A) 02/20/2020 0200   PROTEINUR 100 (A) 02/20/2020 0200   UROBILINOGEN 0.2 12/04/2019 1032   UROBILINOGEN 0.2 12/15/2017 1417   NITRITE NEGATIVE 02/20/2020 0200   LEUKOCYTESUR NEGATIVE 02/20/2020 0200   Sepsis Labs Invalid input(s): PROCALCITONIN,  WBC,  LACTICIDVEN Microbiology Recent Results (from the past 240 hour(s))  SARS CORONAVIRUS 2 (TAT 6-24 HRS) Nasopharyngeal Nasopharyngeal Swab     Status: None   Collection Time: 02/19/20 11:22 PM   Specimen: Nasopharyngeal Swab  Result Value Ref Range Status   SARS Coronavirus 2 NEGATIVE NEGATIVE Final    Comment: (NOTE) SARS-CoV-2 target nucleic acids are NOT DETECTED. The SARS-CoV-2 RNA is generally detectable in upper and lower respiratory specimens during the acute phase of infection. Negative results do not preclude SARS-CoV-2 infection, do not rule out co-infections with other pathogens, and should not be used as the sole basis for treatment or other patient management decisions. Negative results must be combined with clinical observations, patient history, and epidemiological information. The expected result is Negative. Fact Sheet for Patients: SugarRoll.be Fact Sheet for Healthcare  Providers: https://www.woods-mathews.com/ This test is not yet approved or cleared by the [place] FDA and  has been authorized for detection and/or diagnosis of SARS-CoV-2 by FDA under an Emergency Use Authorization (EUA). This EUA will remain  in effect (meaning this test can be used) for the duration of the COVID-19 declaration under Section 56 4(b)(1) of the Act, 21 U.S.C. section 360bbb-3(b)(1), unless the authorization is terminated or revoked sooner. Performed at [facility], [place] [address], [place], [place] [address]  Time coordinating discharge: 45 minutes  SIGNED:   Tawni Millers, MD  [facility] 02/23/2020, 10:29 AM

## 2020-02-23 NOTE — Progress Notes (Signed)
Discharge instructions discussed with patient and family, verbalized agreement and understanding 

## 2020-02-23 NOTE — Progress Notes (Signed)
Patient ID: Jared [name], male   DOB: 12-Mar-1966, 54 y.o.   MRN: [id number]    Progress Note   Subjective   Day # 3 CC' small bowel obstruction/Crohn's  WBC 7.1, hemoglobin 12.3, glucose 286  KUB pending this a.m.  Patient continues to improve, tolerated full liquids without difficulty, had several loose stools yesterday.  Still some right lower quadrant discomfort but less distended.  Feels he can try solid food    Objective   Vital signs in last 24 hours: Temp:  [97.8 F (36.6 C)-99 F (37.2 C)] 97.8 F (36.6 C) (04/10 0550) Pulse Rate:  [66-69] 69 (04/10 0550) Resp:  [16-17] 16 (04/10 0550) BP: (132-171)/(81-91) 154/85 (04/10 0550) SpO2:  [97 %-100 %] 100 % (04/10 0550) Last BM Date: 02/22/20 General:  AA male in NAD Heart:  Regular rate and rhythm; no murmurs Lungs: Respirations even and unlabored, lungs CTA bilaterally Abdomen:  Soft, significantly less distended, mild tenderness in the right lower quadrant though that has improved as well. Normal bowel sounds. Extremities:  Without edema. Neurologic:  Alert and oriented,  grossly normal neurologically. Psych:  Cooperative. Normal mood and affect.  Intake/Output from previous day: 04/09 0701 - 04/10 0700 In: 1000 [P.O.:1000] Out: 350 [Urine:350] Intake/Output this shift: No intake/output data recorded.  Lab Results: Recent Labs    02/21/20 0455 02/22/20 0449 02/23/20 0438  WBC 7.9 6.9 7.1  HGB 11.1* 11.9* 12.3*  HCT 35.8* 38.0* 39.5  PLT 292 316 332   BMET Recent Labs    02/21/20 0455 02/22/20 0449 02/23/20 0438  NA 139 141 138  K 3.9 3.8 3.6  CL 106 106 102  CO2 23 25 24   GLUCOSE 164* 193* 286*  BUN 11 13 16   CREATININE 0.80 0.81 0.80  CALCIUM 8.1* 8.4* 8.6*   LFT No results for input(s): PROT, ALBUMIN, AST, ALT, ALKPHOS, BILITOT, BILIDIR, IBILI in the last 72 hours. PT/INR No results for input(s): LABPROT, INR in the last 72 hours.  Studies/Results: DG Abd 2 Views  Result Date:  02/21/2020 CLINICAL DATA:  Diarrhea, generalized abdominal pain. EXAM: ABDOMEN - 2 VIEW COMPARISON:  February 20, 2020. FINDINGS: Nasogastric tube is looped within the proximal stomach. No colonic dilatation is noted. Stable small bowel dilatation is noted in the left side of the abdomen concerning for distal small bowel obstruction or possibly ileus. Stool is noted in the colon. IMPRESSION: Nasogastric tube is looped within the proximal stomach. Stable small bowel dilatation is noted in left side of the abdomen concerning for distal small bowel obstruction or possibly ileus. Electronically Signed   By: Marijo Conception M.D.   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  Patient noted to have multiple jejunal loops dilated on admission imaging as well as thickening of the TI. It is not clear whether his current obstruction has been secondary to adhesive disease versus new areas of active Crohn's. He has had significant improvement over the past couple of days. KUB pending this a.m. Tolerating full liquids, okay to advance to soft/low residue diet today  Will stop Solu-Medrol and switch to prednisone 40 mg p.o. every morning.  #2 adult onset diabetes mellitus-on oral agents-sugars up secondary to steroids   Plan; patient may be okay to discharge later today on very soft low roughage diet over the  next few days. Please discharge on prednisone 40 mg p.o. every morning x2 weeks then decrease to 30 mg p.o. every morning x2 weeks then decrease to 20 mg p.o. daily. Stop budesonide while on prednisone  We will arrange for outpatient follow-up with Dr. Tarri Glenn within the next couple of weeks.  Our office will call patient on Monday without appointment time, thank you      Principal Problem:   Acute Crohn's disease with intestinal  obstruction ([place]) Active Problems:   Diabetes mellitus type II, non insulin dependent ([place])   HTN (hypertension)   Ileus ([place])     LOS: 3 days   Dannie Woolen PA-C 02/23/2020, 9:08 AM

## 2020-02-26 ENCOUNTER — Other Ambulatory Visit: Payer: Self-pay

## 2020-02-26 NOTE — Patient Outreach (Signed)
[facility]) Care Management  02/26/2020  Jared [name] 1965-12-02 [id number]   Referral Date: 02/26/20 Referral Source: [employer] Report Date of Admission: 02/19/20 Diagnosis: Crohn's Disease Date of Discharge: 02/23/20 Facility: [place]: [facility]  Referral received.  No outreach warranted at this time.  Transition of Care calls being completed via EMMI. RN CM will outreach patient for any red flags received.    Plan: RN CM will close case.   Jone Baseman, RN, MSN [facility] Care Management Care Management Coordinator Direct Line [phone] Toll Free: [phone]  Fax: [phone]

## 2020-03-04 ENCOUNTER — Ambulatory Visit (INDEPENDENT_AMBULATORY_CARE_PROVIDER_SITE_OTHER): Payer: Medicare HMO | Admitting: Nurse Practitioner

## 2020-03-04 ENCOUNTER — Telehealth: Payer: Self-pay | Admitting: Nurse Practitioner

## 2020-03-04 ENCOUNTER — Ambulatory Visit (INDEPENDENT_AMBULATORY_CARE_PROVIDER_SITE_OTHER): Payer: Medicare HMO | Admitting: Family Medicine

## 2020-03-04 ENCOUNTER — Encounter: Payer: Self-pay | Admitting: Nurse Practitioner

## 2020-03-04 ENCOUNTER — Encounter: Payer: Self-pay | Admitting: Family Medicine

## 2020-03-04 ENCOUNTER — Other Ambulatory Visit: Payer: Self-pay

## 2020-03-04 VITALS — BP 144/69 | HR 89 | Temp 98.8°F | Resp 16 | Ht 74.0 in | Wt 210.0 lb

## 2020-03-04 VITALS — BP 150/70 | HR 100 | Temp 97.4°F | Ht 74.0 in | Wt 210.8 lb

## 2020-03-04 DIAGNOSIS — I10 Essential (primary) hypertension: Secondary | ICD-10-CM

## 2020-03-04 DIAGNOSIS — K50112 Crohn's disease of large intestine with intestinal obstruction: Secondary | ICD-10-CM | POA: Diagnosis not present

## 2020-03-04 DIAGNOSIS — E1140 Type 2 diabetes mellitus with diabetic neuropathy, unspecified: Secondary | ICD-10-CM | POA: Diagnosis not present

## 2020-03-04 DIAGNOSIS — F17200 Nicotine dependence, unspecified, uncomplicated: Secondary | ICD-10-CM | POA: Diagnosis not present

## 2020-03-04 LAB — POCT URINALYSIS DIPSTICK
Bilirubin, UA: NEGATIVE
Blood, UA: NEGATIVE
Glucose, UA: POSITIVE — AB
Leukocytes, UA: NEGATIVE
Nitrite, UA: NEGATIVE
Protein, UA: POSITIVE — AB
Spec Grav, UA: >=1.03 — AB (ref 1.010–1.025)
Urobilinogen, UA: 0.2 E.U./dL
pH, UA: 6 (ref 5.0–8.0)

## 2020-03-04 LAB — POCT GLYCOSYLATED HEMOGLOBIN (HGB A1C): Hemoglobin A1C: 9.1 % — AB (ref 4.0–5.6)

## 2020-03-04 MED ORDER — VARENICLINE TARTRATE 1 MG PO TABS: 1.0000 mg | ORAL_TABLET | Freq: Two times a day (BID) | ORAL | 0 refills | Status: DC

## 2020-03-04 MED ORDER — LORAZEPAM 0.5 MG PO TABS: 0.2500 mg | ORAL_TABLET | Freq: Two times a day (BID) | ORAL | 0 refills | Status: DC | PRN

## 2020-03-04 MED FILL — CHANTIX 1 MG CONT MONTH BOX: 1 | 28 days supply | Qty: 56 | Fill #0

## 2020-03-04 NOTE — Progress Notes (Signed)
Patient Jared [name]   Subjective:  Patient ID: Jared [name], male    DOB: 04/12/1966  Age: 54 y.o. MRN: [id number]  CC:  Chief Complaint  Patient presents with  . Hypertension  . Diabetes  . 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  Patient was recently hospitalized for a Crohn's flare 02/19/2020.  Type II hospitalization patient was taken budesonide at home for Crohn's disease.  He presented to the emergency department with 7 days of nausea and abdominal pain.  Symptoms were worsening for 24 hours prior to hospitalization.  Patient was admitted for an acute Crohn's colitis flare where he received systemic steroids with methylprednisolone and an NG tube.  Patient symptoms improved, NG tube was removed and diet was advanced as tolerated.  Patient states that he was discharged with a prednisone taper and is scheduled to follow-up with gastroenterology in 1 month.  He states that he has been tolerating diet and is not having pain at present.  He denies headache, dizziness, chest pain, nausea, vomiting, abdominal pain, constipation, or diarrhea.  Patient also denies fever, chills, or urinary symptoms.  He also has a history of uncontrolled diabetes mellitus with neuropathy.  Patient says that he has been following a low carbohydrate diet.  However, patient has been on prednisone taper since hospital discharge.  He states that blood sugars have been elevated despite medications.  He has been taking medications consistently without interruption.  Previous hemoglobin A1c was 8.7.  He denies increased hunger, thirst, or increased urination.  He endorses worsening numbness, tingling, and shooting pain to lower extremities.  Past Medical History:    Diagnosis Date  . Crohn disease ([place]) 2008   with hospital admission in 2011, and 8/12 for flare up and SBO relieved with bowel rest. no suregery, no meds for CD  . Diabetes mellitus type II   . Hypertension     Past Surgical History:  Procedure Laterality Date  . BOWEL RESECTION  01/25/2012   Procedure: SMALL BOWEL RESECTION;  Surgeon: [name], MD;  Location: [facility];  Service: General;  Laterality: N/A;  . 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 LAPAROTOMY  01/25/2012   Procedure: EXPLORATORY LAPAROTOMY;  Surgeon: [name], MD;  Location: [facility];  Service: General;  Laterality: N/A;  . SCROTAL SURGERY  ~ 19   "took out a pellet"    Family History  Problem Relation Age of Onset  . Cancer Mother        lung  . Diabetes Mother   . Alopecia Mother   . Coronary artery disease Mother   . Diabetes Sister   . Hyperlipidemia Sister   . Hypertension Sister   . Angina Brother   . Hepatitis Brother   . Alcohol abuse Brother   . Diabetes Brother   . Colon cancer Paternal Grandmother   . Esophageal cancer Neg Hx   . Stomach cancer Neg Hx   . Rectal cancer Neg Hx     Social History   Socioeconomic History  . Marital status: Single    Spouse name: Not on file  . Number of children: 0  . Years of education: Not on file  . Highest education level: Not on file  Occupational History  . Not on file  Tobacco Use  . Smoking status: Current Every Day Smoker    Packs/day: 0.25    Years: 30.00    Pack years: 7.50    Types: Cigarettes  . Smokeless tobacco: Former Systems developer    Types: [place] date: 01/20/1997  Substance and Sexual Activity  . Alcohol use: Not Currently  . Drug use: No  . Sexual activity: Not Currently  Other Topics Concern  . Not on file  Social History Narrative   Lives in [place].Is single. Has a sister in [place]. He has a twin brother that passed away from autoimmune hepatitis.  Smokes 2-3 cigarettes a day. Drinks beer  once every few months. No history of drug abuse. Studied up to 12th grade. Has orange card. No health insurance. Currently employed cleaning buildings.   Social Determinants of Health   Financial Resource Strain:   . Difficulty of Paying Living Expenses:   Food Insecurity:   . Worried About Charity fundraiser in the Last Year:   . Arboriculturist in the Last Year:   Transportation Needs:   . Film/video editor (Medical):   Marland Kitchen Lack of Transportation (Non-Medical):   Physical Activity:   . Days of Exercise per Week:   . Minutes of Exercise per Session:   Stress:   . Feeling of Stress :   Social Connections:   . Frequency of Communication with Friends and Family:   . Frequency of Social Gatherings with Friends and Family:   . Attends Religious Services:   . Active Member of Clubs or Organizations:   . Attends Archivist Meetings:   Marland Kitchen Marital Status:   Intimate Partner Violence:   . Fear of Current or Ex-Partner:   . Emotionally Abused:   Marland Kitchen Physically Abused:   . Sexually Abused:     Outpatient Medications Prior to Visit  Medication Sig Dispense Refill  . atorvastatin (LIPITOR) 10 MG tablet Take 1 tablet (10 mg total) by mouth daily. 90 tablet 3  . Blood Glucose Monitoring Suppl (TRUE METRIX METER) w/Device KIT 1 each by Does not apply route 4 (four) times daily -  before meals and at bedtime. 1 kit 0  . glucose blood (TRUE METRIX BLOOD GLUCOSE TEST) test strip Use as instructed 100 each 12  . hydrOXYzine (ATARAX/VISTARIL) 10 MG tablet Take 1 tablet (10 mg total) by mouth 3 (three) times daily as needed for anxiety. 90 tablet 3  . lisinopril (ZESTRIL) 20 MG tablet Take 1 tablet (20 mg total) by mouth daily. 30 tablet 3  . LORazepam (ATIVAN) 0.5 MG tablet Take 0.5 tablets (0.25 mg total) by mouth 2 (two) times daily as needed for anxiety. 10 tablet 0  . metFORMIN (GLUCOPHAGE) 1000 MG tablet TAKE 1 TABLET BY MOUTH 2 TIMES DAILY WITH A MEAL. (Patient taking differently:  Take 1,000 mg by mouth 2 (two) times daily with a meal. ) 60 tablet 5  . omeprazole (PRILOSEC) 40 MG capsule Take 1 capsule (40 mg total) by mouth daily. 30 capsule 1  . ondansetron (ZOFRAN ODT) 4 MG disintegrating tablet 46m ODT q4 hours prn nausea/vomit (Patient taking differently: Take 4 mg by mouth every 4 (four) hours as needed for nausea or vomiting. ) 10 tablet 0  . predniSONE (DELTASONE) 20 MG tablet Take 2 tablets daily for two weeks, then 1 and a half tablet daily for two weeks, then continue with 1 tablet daily. 6[place]  tablet 0  . saxagliptin HCl (ONGLYZA) 5 MG TABS tablet Take 1 tablet (5 mg total) by mouth daily. 90 tablet 3   No facility-administered medications prior to visit.    Allergies  Allergen Reactions  . Trazodone And Nefazodone     hallucinations    ROS Review of Systems  Constitutional: Negative.   HENT: Negative.   Eyes: Negative.   Respiratory: Negative.   Cardiovascular: Negative.   Gastrointestinal: Negative.   Endocrine: Negative for polydipsia, polyphagia and polyuria.  Genitourinary: Negative.   Musculoskeletal: Negative.   Skin: Negative.   Neurological: Positive for numbness.  Psychiatric/Behavioral: Negative.       Objective:    Physical Exam  Constitutional: He is oriented to person, place, and time. He appears well-developed and well-nourished.  HENT:  Head: Normocephalic and atraumatic.  Eyes: Pupils are equal, round, and reactive to light.  Cardiovascular: Normal rate, regular rhythm and normal heart sounds.  Pulmonary/Chest: Effort normal and breath sounds normal.  Abdominal: Soft. Bowel sounds are normal.  Musculoskeletal:        General: Normal range of motion.     Cervical back: Normal range of motion.  Neurological: He is alert and oriented to person, place, and time.  Skin: Skin is warm and dry.  Psychiatric: He has a normal mood and affect. His behavior is normal. Judgment and thought content normal.     BP (!) 144/69 (BP  Location: Right Arm, Patient Position: Sitting, Cuff Size: Large)   Pulse 89   Temp 98.8 F (37.1 C) (Oral)   Resp 16   Ht 6' 2"  (1.88 m)   Wt 210 lb (95.3 kg)   SpO2 99%   BMI 26.96 kg/m  Wt Readings from Last 3 Encounters:  03/04/20 210 lb (95.3 kg)  03/04/20 210 lb 12.8 oz (95.6 kg)  02/20/20 218 lb (98.9 kg)     Health Maintenance Due  Topic Date Due  . COVID-19 Vaccine (1) Never done    There are no preventive care reminders to display for this patient.  No results found for: TSH Lab Results  Component Value Date   WBC 7.1 02/23/2020   HGB 12.3 (L) 02/23/2020   HCT 39.5 02/23/2020   MCV 83.0 02/23/2020   PLT 332 02/23/2020   Lab Results  Component Value Date   NA 138 02/23/2020   K 3.6 02/23/2020   CO2 24 02/23/2020   GLUCOSE 286 (H) 02/23/2020   BUN 16 02/23/2020   CREATININE 0.80 02/23/2020   BILITOT 0.7 02/20/2020   ALKPHOS 60 02/20/2020   AST 13 (L) 02/20/2020   ALT 14 02/20/2020   PROT 6.4 (L) 02/20/2020   ALBUMIN 3.0 (L) 02/20/2020   CALCIUM 8.6 (L) 02/23/2020   ANIONGAP 12 02/23/2020   Lab Results  Component Value Date   CHOL 100 07/19/2019   Lab Results  Component Value Date   HDL 38 (L) 07/19/2019   Lab Results  Component Value Date   LDLCALC 48 07/19/2019   Lab Results  Component Value Date   TRIG 61 07/19/2019   Lab Results  Component Value Date   CHOLHDL 2.6 07/19/2019   Lab Results  Component Value Date   HGBA1C 9.1 (A) 03/04/2020      Assessment & Plan:   Problem List Items Addressed This Visit      Cardiovascular and Mediastinum   HTN (hypertension)   Relevant Orders   Urinalysis Dipstick (Completed)     Endocrine   DM (diabetes  mellitus) ([place]) - Primary   Relevant Orders   CMP and Liver   HgB A1c (Completed)   Urinalysis Dipstick (Completed)     Other   Hypomagnesemia   Relevant Orders   Magnesium      Type 2 diabetes mellitus with diabetic neuropathy, without long-term current use of insulin  (HCC)  - CMP and Liver - HgB A1c - Urinalysis Dipstick  Hypomagnesemia  -Magnesium  Essential hypertension BP (!) 144/69 (BP Location: Right Arm, Patient Position: Sitting, Cuff Size: Large)   Pulse 89   Temp 98.8 F (37.1 C) (Oral)   Resp 16   Ht 6' 2"  (1.88 m)   Wt 210 lb (95.3 kg)   SpO2 99%   BMI 26.96 kg/m   Continue medication, monitor blood pressure at home. Continue DASH diet. Reminder to go to the ER if any CP, SOB, nausea, dizziness, severe HA, changes vision/speech, left arm numbness and tingling and jaw pain.    - Urinalysis Dipstick  Tobacco use disorder Smoking cessation instruction/counseling given:  counseled patient on the dangers of tobacco use, advised patient to stop smoking, and reviewed strategies to maximize success - varenicline (CHANTIX CONTINUING MONTH PAK) 1 MG tablet; Take 1 tablet (1 mg total) by mouth 2 (two) times daily.  Dispense: 56 tablet; Refill: 0  History of Crohn's colitis: Patient advised to continue medications as prescribed.  Also, follow-up with gastroenterology as scheduled, will defer to gastroenterology for further treatment and evaluation.  Follow-up: Return in about 3 months (around 06/03/2020).    [name]  APRN, MSN, FNP-C [facility] [address] [place], [place] [address] [phone]

## 2020-03-04 NOTE — Patient Instructions (Signed)
Crohn's Disease  Crohn's disease is a long-lasting (chronic) disease that affects the gastrointestinal (GI) tract. Crohn's disease often causes irritation and inflammation in the small intestine and the beginning of the large intestine, but it can affect any part of the GI tract. Crohn's disease is part of a group of illnesses that are known as inflammatory bowel disease (IBD). Crohn's disease may start slowly and get worse over time. Symptoms may come and go. They may also go away for months or even years at a time (remission). What are the causes? The exact cause of this condition is not known. It may involve a response that causes your body's disease-fighting (immune) system to attack healthy cells and tissues (autoimmune response). Bacteria, genes, and your environment may also play a role. What increases the risk? The following factors may make you more likely to develop this condition:  Having a family member who has Crohn's disease, another IBD, or an autoimmune condition.  Using products that contain nicotine or tobacco, such as cigarettes and e-cigarettes.  Being in your 69s.  Having [place] European ancestry. What are the signs or symptoms? The main symptoms of this condition involve your GI tract. These include:  Diarrhea.  Pain or cramping in the abdomen. This is commonly felt in the lower right side of the abdomen.  Frequent watery or bloody stools.  Constipation. This may mean having: ? Fewer bowel movements in a week than normal. ? Difficulty having a bowel movement. ? Stools that are dry, hard, or larger than normal.  Rectal bleeding.  Rectal pain.  An urgent need to have a bowel movement.  The feeling that you are not finished having a bowel movement. Other symptoms may include:  Unexplained weight loss.  Fatigue.  Fever.  Nausea.  Loss of appetite.  Joint pain.  Vision changes.  Red bumps or sores on the skin.  Sores inside the mouth. How is  this diagnosed? This condition may be diagnosed based on:  Your symptoms and your medical history.  A physical exam.  Tests, which may include: ? Blood tests. ? Stool sample tests. ? Imaging tests, such as X-rays and CT scans. ? Tests to examine the inside of your intestines using a long, flexible tube that has a light and a camera on the end (endoscopy or colonoscopy). ? A procedure to remove tissue samples from inside your bowel for testing (biopsy). You may need to work with a health care provider who specializes in diseases of the digestive tract (gastroenterologist). How is this treated? There is no cure for this condition, but treatment can help you manage your symptoms. Crohn's disease affects each person differently. Your treatment may include:  Resting your bowels. This involves having a period of healing time when your bowels are not passing stools. This may be done by: ? Drinking only clear liquids. These are liquids that you can see through, such as water, black coffee, fruit juice without pulp, broth, gelatin, and ice pops. ? Getting nutrition through an IV for a period of time.  Medicines. These may be used by themselves or with other treatments (combination therapy). These may include antibiotic medicines. You may be given medicines that help to: ? Reduce inflammation. ? Control your immune system activity. ? Fight infections. ? Relieve cramps and prevent diarrhea. ? Control your pain.  Surgery. You may need surgery if: ? Medicines and other treatments are not working anymore. ? You develop complications from severe Crohn's disease. ? A section of your  intestine becomes so damaged that it needs to be removed. Follow these instructions at home: Medicines  Take over-the-counter and prescription medicines only as told by your health care provider.  If you were prescribed an antibiotic, take it as told by your health care provider. Do not stop taking the antibiotic  even if you start to feel better.  Avoid taking ibuprofen or other NSAID medicines if possible, these can make Crohn's disease worse. Eating and drinking  Talk with your health care provider or a diet and nutrition specialist (registered dietitian) about what diet is best for you.  Drink enough fluid to keep your urine pale yellow.  If you are taking steroids to reduce inflammation, get plenty of calcium in your diet to help keep your bones healthy. You may also consider taking a calcium supplement with vitamin D.  Keep a food diary to identify foods that make your symptoms better or worse.  Avoid foods that cause symptoms.  Follow instructions from your health care provider about eating or drinking restrictions if you have worsening symptoms (flare-up).  Limit alcohol intake to no more than 1 drink a day for nonpregnant women and 2 drinks a day for men. One drink equals 12 oz of beer, 5 oz of wine, or 1 oz of hard liquor. General instructions  Make sure you get all the vaccines that your health care provider recommends, especially pneumonia (pneumococcal) and flu (influenza) vaccines.  Do not use any products that contain nicotine or tobacco, such as cigarettes and e-cigarettes. If you need help quitting, ask your health care provider.  Exercise every day, or as often told by your health care provider.  Keep all follow-up visits as told by your health care provider. This is important. Contact a health care provider if:  You have diarrhea, cramps in your abdomen, and other GI problems that are present almost all the time.  Your symptoms do not improve with treatment.  You continue to lose weight.  You develop a rash or sores on your skin.  You develop eye problems.  You have a fever.  Your symptoms get worse.  You develop new symptoms. Get help right away if:  You have bloody diarrhea.  You have severe pain in your abdomen.  You cannot pass  stools. Summary  Crohn's disease affects each person differently. There are multiple treatment options that can help you manage the condition.  Talk with your health care provider or diet and nutrition specialist (registered dietitian) about what diet is best for you.  Make sure you get all the vaccines that your health care provider recommends, especially pneumonia (pneumococcal) and flu (influenza) vaccines. This information is not intended to replace advice given to you by your health care provider. Make sure you discuss any questions you have with your health care provider. Document Revised: 10/14/2017 Document Reviewed: 07/04/2017 Elsevier Patient Education  2020 [employer].

## 2020-03-04 NOTE — Telephone Encounter (Signed)
Pt stated that [facility] does not fill lorazepam.  Please send rx to [facility] on [address].

## 2020-03-04 NOTE — Progress Notes (Signed)
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   --required no medications for several years until March 2020 when he was having abdominal pain and found to have ulceration of the ileocolonic anastomosis. Biopsies were negative but due to pain was given trial of budesonide which helped  Off medication for a few weeks due to insurance issues, he had recurrent flare symptoms and ended up in the hospital early March with an SBO.  --Still unclear if SBO secondary to active Crohn's versus adhesions.  Improved with steroids and bowel rest improvement --Doing okay now but having difficulty with side effects of prednisone.  The posterior reduce to 30 mg daily on Monday, I will go ahead and have him reduce dose this Friday.  He will take 30 mg daily for 2 weeks,  20 mg daily for 1 week, 10 mg QOD x 3 doses then discontinue.  --I will give him a #20 Ativan 0.25 mg to help with the side effects of Prednisone.  --After completion of steroid taper he may need to resume Entocort vrs another therapy. I will defer this to his primary gastroenterologist, Dr. Tarri Glenn but he doesn't see her until 526/2021. Therefore she may opt to resume Entocort prior to the appointment.   HPI:    Primary GI: Dr. Tarri Glenn . Last office visit 06/14/20  Chief complaint : Spittle follow-up of small bowel obstruct  Jared [name] is a 54 yo male with Crohn's ileitis s/p small bowel resection in 2013 consisting of 25 cm of terminal ileum, IC valve and 5 cm of cecum. No active Crohn's ileeitis on pathology. No active Crohn's disease on CT enterography 01/03/20. Ileocolonic anastomosis ulcers on colonoscopy 01/15/19 . Biopsies were normal but was having intermittent pain so Budeonside started out of concern for active disease.  His symptoms as well as inflammatory markers dramatically  improved on budesonide.  He continued 9 mg of budesonide for several months but then ran into some problems with insurance.  Because of this he was off budesonide for couple weeks at which time he developed Crohn's flare symptoms.  He presented to the ED and was admitted 02/18/20 with abdominal pain / nausea and distention. 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  Possible thickening of the neoterminal ileum.  Active Crohn's could not be excluded.  Other areas of possible low-grade bowel obstruction versus focal ileus within the proximal transverse duodenum and mid jejunum but no evidence of enteritis, developing abdominal adenopathy.  CRP elevated at 6.7.  NG tube placed for decompression.  He was started on IV Solu-Medrol and bowel rest.  Symptoms improved, he was able to tolerate full liquids and was having bowel movements.  He still has some RLQ discomfort but was less distended.  A 2 view of the abdomen on day of discharge showed a nonobstructive bowel gas pattern.   Jared [name] is here for hospital follow-up.  He continues to have intermittent sharp RLQ pain which is worse with bowel movements.  He says this pain is chronic.  Other than that he is doing well.  No nausea or vomiting.  He is on 40 mg of prednisone.  He was given a tapering schedule at hospital discharge.  He is having some uncomfortable side  effects such as feeling anxious, numbness in a foot and increased heart rate at times.  He tried hydroxyzine but it did not help his side effects.    Review of systems:     No chest pain, no SOB, no fevers, no urinary sx   Past Medical History:  Diagnosis Date  . Crohn disease ([place]) 2008   with hospital admission in 2011, and 8/12 for flare up and SBO relieved with bowel rest. no suregery, no meds for CD  . Diabetes mellitus type II   . Hypertension     Patient's surgical history, family  medical history, social history, medications and allergies were all reviewed in Epic   Serum creatinine: 0.8 mg/dL 02/23/20 0438 Estimated creatinine clearance: 122.7 mL/min  Current Outpatient Medications  Medication Sig Dispense Refill  . atorvastatin (LIPITOR) 10 MG tablet Take 1 tablet (10 mg total) by mouth daily. 90 tablet 3  . Blood Glucose Monitoring Suppl (TRUE METRIX METER) w/Device KIT 1 each by Does not apply route 4 (four) times daily -  before meals and at bedtime. 1 kit 0  . glucose blood (TRUE METRIX BLOOD GLUCOSE TEST) test strip Use as instructed 100 each 12  . hydrOXYzine (ATARAX/VISTARIL) 10 MG tablet Take 1 tablet (10 mg total) by mouth 3 (three) times daily as needed for anxiety. 90 tablet 3  . lisinopril (ZESTRIL) 20 MG tablet Take 1 tablet (20 mg total) by mouth daily. 30 tablet 3  . metFORMIN (GLUCOPHAGE) 1000 MG tablet TAKE 1 TABLET BY MOUTH 2 TIMES DAILY WITH A MEAL. (Patient taking differently: Take 1,000 mg by mouth 2 (two) times daily with a meal. ) 60 tablet 5  . omeprazole (PRILOSEC) 40 MG capsule Take 1 capsule (40 mg total) by mouth daily. 30 capsule 1  . ondansetron (ZOFRAN ODT) 4 MG disintegrating tablet 63m ODT q4 hours prn nausea/vomit (Patient taking differently: Take 4 mg by mouth every 4 (four) hours as needed for nausea or vomiting. ) 10 tablet 0  . predniSONE (DELTASONE) 20 MG tablet Take 2 tablets daily for two weeks, then 1 and a half tablet daily for two weeks, then continue with 1 tablet daily. 60 tablet 0  . saxagliptin HCl (ONGLYZA) 5 MG TABS tablet Take 1 tablet (5 mg total) by mouth daily. 90 tablet 3   No current facility-administered medications for this visit.    Physical Exam:     Temp (!) 97.4 F (36.3 C)   Ht 6' 2"  (1.88 m)   Wt 210 lb 12.8 oz (95.6 kg)   BMI 27.07 kg/m   GENERAL:  Pleasant male in NAD PSYCH: : Cooperative, normal affect CARDIAC:  RRR,  no peripheral edema PULM: Normal respiratory effort, lungs CTA  bilaterally, no wheezing ABDOMEN:  Nondistended, soft, nontender. No obvious masses, no hepatomegaly,  normal bowel sounds SKIN:  turgor, no lesions seen Musculoskeletal:  Normal muscle tone, normal strength NEURO: Alert and oriented x 3, no focal neurologic deficits  I spent 30 minutes total reviewing records, obtaining history, performing exam, counseling patient and documenting visit / findings.    PTye Savoy, NP 03/04/2020, 9:03 AM

## 2020-03-04 NOTE — Telephone Encounter (Signed)
Patient's lorazepam has been refaxed to [facility] on [address] as requested.

## 2020-03-04 NOTE — Patient Instructions (Addendum)
If you are age 54 or older, your body mass index should be between 23-30. Your Body mass index is 27.07 kg/m. If this is out of the aforementioned range listed, please consider follow up with your Primary Care Provider.  If you are age 7 or younger, your body mass index should be between 19-25. Your Body mass index is 27.07 kg/m. If this is out of the aformentioned range listed, please consider follow up with your Primary Care Provider.   Continue your Prednisone tapered as directed below: 40 mg until Friday 30 mg for 2 weeks then,  20 mg for 1 week then,  10 mg every other day for 3 doses then stop  Take Lorazepam 0.5 mg 1/2 tablet twice a day as needed.  You have been scheduled to Follow up with Dr. Tarri Glenn on Apr 09, 2020 arriving at 8:40 am.

## 2020-03-05 ENCOUNTER — Telehealth: Payer: Self-pay

## 2020-03-05 ENCOUNTER — Other Ambulatory Visit: Payer: Self-pay

## 2020-03-05 ENCOUNTER — Encounter: Payer: Self-pay | Admitting: Nurse Practitioner

## 2020-03-05 DIAGNOSIS — K50112 Crohn's disease of large intestine with intestinal obstruction: Secondary | ICD-10-CM

## 2020-03-05 LAB — CMP AND LIVER
ALT: 32 IU/L (ref 0–44)
AST: 15 IU/L (ref 0–40)
Albumin: 4 g/dL (ref 3.8–4.9)
Alkaline Phosphatase: 87 IU/L (ref 39–117)
BUN: 12 mg/dL (ref 6–24)
Bilirubin Total: 0.3 mg/dL (ref 0.0–1.2)
Bilirubin, Direct: 0.11 mg/dL (ref 0.00–0.40)
CO2: 22 mmol/L (ref 20–29)
Calcium: 9.2 mg/dL (ref 8.7–10.2)
Chloride: 99 mmol/L (ref 96–106)
Creatinine, Ser: 0.95 mg/dL (ref 0.76–1.27)
GFR calc Af Amer: 104 mL/min/{1.73_m2} (ref >59)
GFR calc non Af Amer: 90 mL/min/{1.73_m2} (ref >59)
Glucose: 274 mg/dL — ABNORMAL HIGH (ref 65–99)
Potassium: 4.4 mmol/L (ref 3.5–5.2)
Sodium: 142 mmol/L (ref 134–144)
Total Protein: 6.4 g/dL (ref 6.0–8.5)

## 2020-03-05 LAB — MAGNESIUM: Magnesium: 2 mg/dL (ref 1.6–2.3)

## 2020-03-05 MED ORDER — BUDESONIDE 3 MG PO CPEP: 9.0000 mg | ORAL_CAPSULE | Freq: Every day | ORAL | 6 refills | Status: DC

## 2020-03-05 NOTE — Telephone Encounter (Signed)
-----   Message from Thornton Park, MD sent at 03/05/2020  3:09 PM EDT -----   ----- Message ----- From: Willia Craze, NP Sent: 03/05/2020   1:30 PM EDT To: Thornton Park, MD

## 2020-03-05 NOTE — Telephone Encounter (Signed)
Editor: Thornton Park, MD (Physician)     Show:Clear all [x] Manual[] Template[] Copied  Added by: [x] Thornton Park, MD  [] Hover for details Given his recent symptoms, I would like for him to resume Entecort 40m daily. Submit fecal calprotectin within the week. Thank you.      Script sent to pharmacy, order in epic for lab. Left message for pt to call back.  Spoke with pt and he is aware.

## 2020-03-05 NOTE — Progress Notes (Signed)
Given his recent symptoms, I would like for him to resume Entecort 95m daily. Submit fecal calprotectin within the week. Thank you.

## 2020-03-06 ENCOUNTER — Telehealth: Payer: Self-pay

## 2020-03-06 ENCOUNTER — Other Ambulatory Visit: Payer: Medicare HMO

## 2020-03-06 NOTE — Telephone Encounter (Signed)
-----   Message from Dorena Dew, [place] sent at 03/05/2020  4:36 PM EDT ----- Regarding: lab results Please inform patient that all labs are within a normal range. Refrain from taking potassium supplement. Recommend a carbohydrate modified diet divided over 5 meals throughout the day. Will repeat hemoglobin a1C in 3 months. Follow up with GI specialist as scheduled.    [name]  APRN, MSN, FNP-C [facility] [address] [place], [place] [address] [phone]

## 2020-03-06 NOTE — Telephone Encounter (Signed)
Called and spoke with patient advised of labs are within a normal range. Advised not to take potassium and that we will repeat a1c in 3 months. He does have a follow up with GI next month. Thanks !

## 2020-03-06 NOTE — Telephone Encounter (Signed)
Called, no answer. Left a message to call back.

## 2020-03-07 ENCOUNTER — Other Ambulatory Visit: Payer: Medicare HMO

## 2020-03-07 DIAGNOSIS — K50112 Crohn's disease of large intestine with intestinal obstruction: Secondary | ICD-10-CM

## 2020-03-11 LAB — CALPROTECTIN, FECAL: Calprotectin, Fecal: 211 ug/g — ABNORMAL HIGH (ref 0–120)

## 2020-03-11 MED FILL — ATORVASTATIN 10 MG TABLET: 10 | 90 days supply | Qty: 90 | Fill #2

## 2020-03-11 MED FILL — ONGLYZA 5 MG TABLET: 5 | 60 days supply | Qty: 60 | Fill #8

## 2020-03-26 ENCOUNTER — Other Ambulatory Visit: Payer: Self-pay | Admitting: Family Medicine

## 2020-03-26 DIAGNOSIS — E1140 Type 2 diabetes mellitus with diabetic neuropathy, unspecified: Secondary | ICD-10-CM

## 2020-04-09 ENCOUNTER — Other Ambulatory Visit: Payer: Self-pay

## 2020-04-09 ENCOUNTER — Encounter: Payer: Self-pay | Admitting: Gastroenterology

## 2020-04-09 ENCOUNTER — Other Ambulatory Visit (INDEPENDENT_AMBULATORY_CARE_PROVIDER_SITE_OTHER): Payer: Medicare HMO

## 2020-04-09 ENCOUNTER — Ambulatory Visit (INDEPENDENT_AMBULATORY_CARE_PROVIDER_SITE_OTHER): Payer: Medicare HMO | Admitting: Gastroenterology

## 2020-04-09 VITALS — BP 130/60 | HR 76 | Ht 74.0 in | Wt 212.0 lb

## 2020-04-09 DIAGNOSIS — D509 Iron deficiency anemia, unspecified: Secondary | ICD-10-CM

## 2020-04-09 DIAGNOSIS — K50112 Crohn's disease of large intestine with intestinal obstruction: Secondary | ICD-10-CM

## 2020-04-09 DIAGNOSIS — K50919 Crohn's disease, unspecified, with unspecified complications: Secondary | ICD-10-CM

## 2020-04-09 DIAGNOSIS — R109 Unspecified abdominal pain: Secondary | ICD-10-CM

## 2020-04-09 DIAGNOSIS — R1084 Generalized abdominal pain: Secondary | ICD-10-CM

## 2020-04-09 LAB — IBC + FERRITIN
Ferritin: 10.1 ng/mL — ABNORMAL LOW (ref 22.0–322.0)
Iron: 46 ug/dL (ref 42–165)
Saturation Ratios: 9.8 % — ABNORMAL LOW (ref 20.0–50.0)
Transferrin: 334 mg/dL (ref 212.0–360.0)

## 2020-04-09 LAB — SEDIMENTATION RATE: Sed Rate: 57 mm/hr — ABNORMAL HIGH (ref 0–20)

## 2020-04-09 LAB — HIGH SENSITIVITY CRP: CRP, High Sensitivity: 6.18 mg/L — ABNORMAL HIGH (ref 0.000–5.000)

## 2020-04-09 NOTE — Patient Instructions (Addendum)
Continue to avoid NSAIDs  Dr.Beavers recommends that you obtain the Covid Vaccine and Flu Vaccines.   Continue Budesonide 65m daily.   Your provider has requested that you go to the basement level for lab work before leaving today. Press "B" on the elevator. The lab is located at the first door on the left as you exit the elevator.  Please keep follow-up in 4-6 weeks.  Due to recent changes in healthcare laws, you may see the results of your imaging and laboratory studies on MyChart before your provider has had a chance to review them.  We understand that in some cases there may be results that are confusing or concerning to you. Not all laboratory results come back in the same time frame and the provider may be waiting for multiple results in order to interpret others.  Please give [id number] hours in order for your provider to thoroughly review all the results before contacting the office for clarification of your results.   If you are age 789or older, your body mass index should be between 23-30. Your Body mass index is 27.22 kg/m. If this is out of the aforementioned range listed, please consider follow up with your Primary Care Provider.  If you are age [age]or younger, your body mass index should be between 19-25. Your Body mass index is 27.22 kg/m. If this is out of the aformentioned range listed, please consider follow up with your Primary Care Provider.    Thank you for choosing me and [facility].  Dr.Beavers

## 2020-04-09 NOTE — Progress Notes (Addendum)
Referring Provider: Lanae Boast, [place] Primary Care Physician:  Lanae Boast, FNP   Chief Complaint: Abdominal pain   IMPRESSION:  Crohn's ileitis status post small bowel resection 2013    -25 cm of terminal ileum, IC valve, and 5 cm of cecum were resected       -  active Crohn's ileitis on pathology    - has not required any medical therapy since that time until 01/2019    - no history of extra-GI manifestations of IBD    - 12/2018:  CRP 1.2, ESR 52, and fecal calprotectin 282    - 03/21/19: CRP 26, ESR 67, fecal calprotectin 213    - 06/15/19: CRP <1, ESR 61, fecal calprotectin pending    - CT enterography 01/02/19: no active Crohn's disease       -  stable surgical changes at the ileocolonic anastomosis       - stable cluster of lymph nodes in the small bowel mesentery    - Ileocolonic anastomoses ulcers on colonoscopy 01/15/19       - normal colonic mucosa biopsies    - treated with budesonide given ongoing symptoms    - admitted 02/18/20 for abdominal pain, nausea, and distnsion       - treated with IV steroids and bowel rest    - CT abd/pelvis with contrast 02/18/20: active Crohn's disease in the distal small bowel H pylori negative gastritis and duodenitis, recently started PPI therapy    - likely related to NSAIDs Intermittent abdominal pain and bloating, now improved Diabetes with diabetic neuropathy Recent ibuprofen   Crohn's ileitis: Incomplete response to trial of budesonide versus symptoms related to adhesive disease. Labs today to monitor for inflammatory state and to screen for candidacy for biologics.    Labs today for screen for iron deficiency.     PLAN: - Continue to avoid NSAIDs - Fecal calprotectin, ESR, CRP, ion, ferritin  - CBC with differential, CMP - Hepatitis B surface antigen (HBsAg), hepatitis B surface antibody, and hepatitis B core antibody HCV Ab - QuantiFERON-TB Gold  - HIV screening - Varicella zoster virus IgG - Anti-EBV and anti-CMV  antibodies  - Continue budesonide 9 mg daily - Obtain the Covid vaccine, flu vaccine - After review labs, will proceed with ustekinumab 520 mg IV x 1 followed by 90 mg  every 8 weeks.  - Follow-up with me in 4-6 weeks, earlier if needed   HPI: Jared [name] is a 54 y.o. male with Crohn's disease and abdominal pain.  The interval history is obtained through the patient and review of his electronic health record. This is my first visit with Jared [name] since his hospitalization in April 2020.   Continues to have intermittent, sharp, non-radiating right-sided abdominal pain not improved with daily PPI therapy. Worse when going the bathroom or sitting up. Bloating has largely improved.  Two loose/watery bowel movements daily, may be followed a small solid stool later. Some improvement in pain with defecation.  He doesn't feel like things have "opened up" like it should due to the ileitis. No blood or mucous.  Weight stable. Appetite is okay.  Avoids all NSAIDs.   Right shoulder pain, bilateral knees, right hip. No other extra GI manifestations of IBD.   Fecal calprotectin 03/07/2020 was 211.  It had been 57 9 months ago and 213 a year ago prior to starting budesonide. I have personally reviewed his CT scan abd/pelvis with contrast from 4/6/21showing surgical changes at the ileocecal  junction. Possible active crohns disease. Low grade obstruction versus focal ileus.   Refusing the Covid vaccine and the flu vaccine.  No prior vaccination for HAV or HBV. No Pneumovax.  Had chicken pox as a child.   Past Medical History:  Diagnosis Date  . Crohn disease ([place]) 2008   with hospital admission in 2011, and 8/12 for flare up and SBO relieved with bowel rest. no suregery, no meds for CD  . Diabetes mellitus type II   . Hypertension     Past Surgical History:  Procedure Laterality Date  . BOWEL RESECTION  01/25/2012   Procedure: SMALL BOWEL RESECTION;  Surgeon: [name], MD;  Location: [facility];  Service: General;  Laterality: N/A;  . 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 LAPAROTOMY  01/25/2012   Procedure: EXPLORATORY LAPAROTOMY;  Surgeon: [name], MD;  Location: [facility];  Service: General;  Laterality: N/A;  . SCROTAL SURGERY  ~ 2   "took out a pellet"    Current Outpatient Medications  Medication Sig Dispense Refill  . atorvastatin (LIPITOR) 10 MG tablet Take 1 tablet (10 mg total) by mouth daily. 90 tablet 3  . Blood Glucose Monitoring Suppl (TRUE METRIX METER) w/Device KIT 1 each by Does not apply route 4 (four) times daily -  before meals and at bedtime. 1 kit 0  . budesonide (ENTOCORT EC) 3 MG 24 hr capsule Take 3 capsules (9 mg total) by mouth daily. 90 capsule 6  . glucose blood (TRUE METRIX BLOOD GLUCOSE TEST) test strip Use as instructed 100 each 12  . hydrOXYzine (ATARAX/VISTARIL) 10 MG tablet Take 1 tablet (10 mg total) by mouth 3 (three) times daily as needed for anxiety. 90 tablet 3  . lisinopril (ZESTRIL) 20 MG tablet Take 1 tablet (20 mg total) by mouth daily. 30 tablet 3  . LORazepam (ATIVAN) 0.5 MG tablet Take 0.5 tablets (0.25 mg total) by mouth 2 (two) times daily as needed for anxiety. 10 tablet 0  . metFORMIN (GLUCOPHAGE) 1000 MG tablet TAKE 1 TABLET BY MOUTH 2 TIMES DAILY WITH A MEAL. 180 tablet 2  . omeprazole (PRILOSEC) 40 MG capsule Take 1 capsule (40 mg total) by mouth daily. 30 capsule 1  . ondansetron (ZOFRAN ODT) 4 MG disintegrating tablet 64m ODT q4 hours prn nausea/vomit (Patient taking differently: Take 4 mg by mouth every 4 (four) hours as needed for nausea or vomiting. ) 10 tablet 0  . saxagliptin HCl (ONGLYZA) 5 MG TABS tablet Take 5 mg by mouth daily.    . varenicline (CHANTIX CONTINUING MONTH PAK) 1 MG tablet Take 1 tablet (1 mg total) by mouth 2 (two) times daily. 56 tablet 0   No current facility-administered medications for this visit.    Allergies as of 04/09/2020 - Review Complete 04/09/2020  Allergen  Reaction Noted  . Trazodone and nefazodone  12/28/2018    Family History  Problem Relation Age of Onset  . Cancer Mother        lung  . Diabetes Mother   . Alopecia Mother   . Coronary artery disease Mother   . Diabetes Sister   . Hyperlipidemia Sister   . Hypertension Sister   . Angina Brother   . Hepatitis Brother   . Alcohol abuse Brother   . Diabetes Brother   . Colon cancer Paternal Grandmother   . Esophageal cancer Neg Hx   . Stomach cancer Neg Hx   . Rectal  cancer Neg Hx     Social History   Socioeconomic History  . Marital status: Single    Spouse name: Not on file  . Number of children: 0  . Years of education: Not on file  . Highest education level: Not on file  Occupational History  . Not on file  Tobacco Use  . Smoking status: Current Every Day Smoker    Packs/day: 0.25    Years: 30.00    Pack years: 7.50    Types: Cigarettes  . Smokeless tobacco: Former Systems developer    Types: [place] date: 01/20/1997  Substance and Sexual Activity  . Alcohol use: Not Currently  . Drug use: No  . Sexual activity: Not Currently  Other Topics Concern  . Not on file  Social History Narrative   Lives in [place].Is single. Has a sister in [place]. He has a twin brother that passed away from autoimmune hepatitis.  Smokes 2-3 cigarettes a day. Drinks beer once every few months. No history of drug abuse. Studied up to 12th grade. Has orange card. No health insurance. Currently employed cleaning buildings.   Social Determinants of Health   Financial Resource Strain:   . Difficulty of Paying Living Expenses:   Food Insecurity:   . Worried About Charity fundraiser in the Last Year:   . Arboriculturist in the Last Year:   Transportation Needs:   . Film/video editor (Medical):   Marland Kitchen Lack of Transportation (Non-Medical):   Physical Activity:   . Days of Exercise per Week:   . Minutes of Exercise per Session:   Stress:   . Feeling of Stress :   Social Connections:    . Frequency of Communication with Friends and Family:   . Frequency of Social Gatherings with Friends and Family:   . Attends Religious Services:   . Active Member of Clubs or Organizations:   . Attends Archivist Meetings:   Marland Kitchen Marital Status:   Intimate Partner Violence:   . Fear of Current or Ex-Partner:   . Emotionally Abused:   Marland Kitchen Physically Abused:   . Sexually Abused:     Physical Exam: General: in no acute distress Neuro: Alert and appropriate Psych: Normal affect and normal insight Abd: Tender RLQ; normal bowel sounds Exam otherwise limited due to telehealth technologies.   Eugine Bubb L. Tarri Glenn, MD, MPH [facility] 04/09/2020, 9:11 AM

## 2020-04-11 LAB — QUANTIFERON-TB GOLD PLUS
Mitogen-NIL: >10 IU/mL
NIL: 0.03 IU/mL
QuantiFERON-TB Gold Plus: NEGATIVE
TB1-NIL: 0 IU/mL
TB2-NIL: 0.01 IU/mL

## 2020-04-11 LAB — HEPATITIS C ANTIBODY
Hepatitis C Ab: NONREACTIVE
SIGNAL TO CUT-OFF: 0 (ref <1.00)

## 2020-04-11 LAB — VARICELLA ZOSTER ANTIBODY, IGG: Varicella IgG: 2550 index

## 2020-04-11 LAB — HEPATITIS B CORE ANTIBODY, TOTAL: Hep B Core Total Ab: NONREACTIVE

## 2020-04-11 LAB — HEPATITIS B SURFACE ANTIBODY,QUALITATIVE: Hep B S Ab: NONREACTIVE

## 2020-04-11 LAB — HEPATITIS B SURFACE ANTIGEN: Hepatitis B Surface Ag: NONREACTIVE

## 2020-04-11 MED FILL — BUDESONIDE 3 MG CAP: 3 | 30 days supply | Qty: 90 | Fill #3

## 2020-04-14 ENCOUNTER — Emergency Department (HOSPITAL_COMMUNITY)
Admission: EM | Admit: 2020-04-14 | Discharge: 2020-04-14 | Disposition: A | Discharge status: Home / Self Care | Payer: Medicare HMO | Attending: Emergency Medicine | Admitting: Emergency Medicine

## 2020-04-14 ENCOUNTER — Other Ambulatory Visit: Payer: Self-pay

## 2020-04-14 ENCOUNTER — Encounter (HOSPITAL_COMMUNITY): Payer: Self-pay | Admitting: Emergency Medicine

## 2020-04-14 DIAGNOSIS — S61203A Unspecified open wound of left middle finger without damage to nail, initial encounter: Secondary | ICD-10-CM | POA: Insufficient documentation

## 2020-04-14 DIAGNOSIS — F17210 Nicotine dependence, cigarettes, uncomplicated: Secondary | ICD-10-CM | POA: Diagnosis not present

## 2020-04-14 DIAGNOSIS — I10 Essential (primary) hypertension: Secondary | ICD-10-CM | POA: Insufficient documentation

## 2020-04-14 DIAGNOSIS — Y998 Other external cause status: Secondary | ICD-10-CM | POA: Insufficient documentation

## 2020-04-14 DIAGNOSIS — Y93G1 Activity, food preparation and clean up: Secondary | ICD-10-CM | POA: Diagnosis not present

## 2020-04-14 DIAGNOSIS — S61209A Unspecified open wound of unspecified finger without damage to nail, initial encounter: Secondary | ICD-10-CM

## 2020-04-14 DIAGNOSIS — Y92010 Kitchen of single-family (private) house as the place of occurrence of the external cause: Secondary | ICD-10-CM | POA: Insufficient documentation

## 2020-04-14 DIAGNOSIS — Z7984 Long term (current) use of oral hypoglycemic drugs: Secondary | ICD-10-CM | POA: Diagnosis not present

## 2020-04-14 DIAGNOSIS — W268XXA Contact with other sharp object(s), not elsewhere classified, initial encounter: Secondary | ICD-10-CM | POA: Diagnosis not present

## 2020-04-14 DIAGNOSIS — S6992XA Unspecified injury of left wrist, hand and finger(s), initial encounter: Secondary | ICD-10-CM | POA: Diagnosis present

## 2020-04-14 DIAGNOSIS — E119 Type 2 diabetes mellitus without complications: Secondary | ICD-10-CM | POA: Diagnosis not present

## 2020-04-14 DIAGNOSIS — Z23 Encounter for immunization: Secondary | ICD-10-CM | POA: Insufficient documentation

## 2020-04-14 DIAGNOSIS — Z79899 Other long term (current) drug therapy: Secondary | ICD-10-CM | POA: Insufficient documentation

## 2020-04-14 MED ORDER — TETANUS-DIPHTH-ACELL PERTUSSIS 5-2.5-18.5 LF-MCG/0.5 IM SUSP
0.5000 mL | Freq: Once | INTRAMUSCULAR | Status: AC
  Administered 2020-04-14: 0.5 mL via INTRAMUSCULAR
  Filled 2020-04-14: qty 0.5

## 2020-04-14 NOTE — Discharge Instructions (Signed)
Keep area clean and apply a new bandage at least once daily, change more often if it is dirty Watch for signs of infection (redness, drainage, worsening pain) Take Tylenol or Ibuprofen for pain as needed Return for worsening symptoms

## 2020-04-14 NOTE — ED Triage Notes (Signed)
Pt reports slicing vegetables with slicer and got tip of left middle finger. Bleeding controled at this time with band-aid.

## 2020-04-14 NOTE — ED Provider Notes (Signed)
[facility] DEPT Provider Note   CSN: [id number] Arrival date & time: 04/14/20  1725     History Chief Complaint  Patient presents with  . Finger Injury    Jared [name] is a 54 y.o. male with history of diabetes, Crohn's disease who presents with a left middle finger injury. He states that he was cutting potatoes with a new mandolin and it sliced off the tip of this left middle finger. He immediately rinsed it off and put pressure and toilet paper on it to stop the bleeding. It continued to bleed so he wrapped it tightly with multiple bandaids and decided to come to the ED. He reports the finger is throbbing but otherwise he has normal sensation and range of motion. He is requesting a splint so he doesn't hit it on anything and cause the bleeding to recur. He is not UTD on tetanus. He is not on blood thinners.  HPI     Past Medical History:  Diagnosis Date  . Crohn disease ([place]) 2008   with hospital admission in 2011, and 8/12 for flare up and SBO relieved with bowel rest. no suregery, no meds for CD  . Diabetes mellitus type II   . Hypertension     Patient Active Problem List   Diagnosis Date Noted  . Ileus ([place]) 02/21/2020  . Acute Crohn's disease with intestinal obstruction ([place]) 02/20/2020  . SBO (small bowel obstruction) ([place])   . Chest pain 11/05/2018  . HTN (hypertension) 11/05/2018  . Cervical spine fracture ([place]) 07/28/2017  . C2 cervical fracture ([place]) 07/28/2017  . Dyslipidemia 09/06/2014  . Left facial swelling 09/06/2014  . Dental caries 09/06/2014  . Crohn's disease ([place]) 06/30/2014  . Tobacco use disorder 06/30/2014  . Diabetes mellitus type II, non insulin dependent ([place]) 06/30/2014  . Crohn disease ([place]) 05/10/2014  . Intra-abdominal abscess ([place]) 02/13/2012  . S/P exploratory laparotomy with ileocecectomy 02/13/2012  . Hypokalemia 02/13/2012  . Hypomagnesemia 02/13/2012  . CD (Crohn's disease) ([place]) 01/20/2012  . DM  (diabetes mellitus) ([place]) 01/20/2012    Past Surgical History:  Procedure Laterality Date  . BOWEL RESECTION  01/25/2012   Procedure: SMALL BOWEL RESECTION;  Surgeon: [name], MD;  Location: [facility];  Service: General;  Laterality: N/A;  . 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 LAPAROTOMY  01/25/2012   Procedure: EXPLORATORY LAPAROTOMY;  Surgeon: [name], MD;  Location: [facility];  Service: General;  Laterality: N/A;  . SCROTAL SURGERY  ~ 12   "took out a pellet"       Family History  Problem Relation Age of Onset  . Cancer Mother        lung  . Diabetes Mother   . Alopecia Mother   . Coronary artery disease Mother   . Diabetes Sister   . Hyperlipidemia Sister   . Hypertension Sister   . Angina Brother   . Hepatitis Brother   . Alcohol abuse Brother   . Diabetes Brother   . Colon cancer Paternal Grandmother   . Esophageal cancer Neg Hx   . Stomach cancer Neg Hx   . Rectal cancer Neg Hx     Social History   Tobacco Use  . Smoking status: Current Every Day Smoker    Packs/day: 0.25    Years: 30.00    Pack years: 7.50    Types: Cigarettes  . Smokeless tobacco: Former Systems developer    Types: Loss adjuster, chartered  Quit date: 01/20/1997  Substance Use Topics  . Alcohol use: Not Currently  . Drug use: No    Home Medications Prior to Admission medications   Medication Sig Start Date End Date Taking? Authorizing Provider  atorvastatin (LIPITOR) 10 MG tablet Take 1 tablet (10 mg total) by mouth daily. 08/10/19   Azzie Glatter, FNP  Blood Glucose Monitoring Suppl (TRUE METRIX METER) w/Device KIT 1 each by Does not apply route 4 (four) times daily -  before meals and at bedtime. 06/21/19   Lanae Boast, FNP  budesonide (ENTOCORT EC) 3 MG 24 hr capsule Take 3 capsules (9 mg total) by mouth daily. 03/05/20   Thornton Park, MD  glucose blood (TRUE METRIX BLOOD GLUCOSE TEST) test strip Use as instructed 07/06/17   Dorena Dew, FNP  hydrOXYzine  (ATARAX/VISTARIL) 10 MG tablet Take 1 tablet (10 mg total) by mouth 3 (three) times daily as needed for anxiety. 05/31/19   Lanae Boast, FNP  lisinopril (ZESTRIL) 20 MG tablet Take 1 tablet (20 mg total) by mouth daily. 05/31/19 05/30/20  Lanae Boast, FNP  LORazepam (ATIVAN) 0.5 MG tablet Take 0.5 tablets (0.25 mg total) by mouth 2 (two) times daily as needed for anxiety. 03/04/20   Willia Craze, NP  metFORMIN (GLUCOPHAGE) 1000 MG tablet TAKE 1 TABLET BY MOUTH 2 TIMES DAILY WITH A MEAL. 03/26/20   Dorena Dew, FNP  omeprazole (PRILOSEC) 40 MG capsule Take 1 capsule (40 mg total) by mouth daily. 10/04/19   Tresa Garter, MD  ondansetron (ZOFRAN ODT) 4 MG disintegrating tablet 37m ODT q4 hours prn nausea/vomit Patient taking differently: Take 4 mg by mouth every 4 (four) hours as needed for nausea or vomiting.  02/18/20   FJacqlyn Larsen PA-C  saxagliptin HCl (ONGLYZA) 5 MG TABS tablet Take 5 mg by mouth daily.    [provider]  varenicline (CHANTIX CONTINUING MONTH PAK) 1 MG tablet Take 1 tablet (1 mg total) by mouth 2 (two) times daily. 03/04/20   HDorena Dew FNP  furosemide (LASIX) 10 MG/ML solution Take by mouth daily.  01/20/12  [provider]  mesalamine (PENTASA) 250 MG CR capsule Take 1,000 mg by mouth 4 (four) times daily.  01/20/12  [provider]  pioglitazone (ACTOS) 15 MG tablet Take by mouth daily.  01/20/12  [provider]    Allergies    Trazodone and nefazodone  Review of Systems   Review of Systems  Skin: Positive for wound.  Neurological: Negative for weakness and numbness.    Physical Exam Updated Vital Signs BP (!) 148/83 (BP Location: Left Arm)   Pulse 87   Temp 98 F (36.7 C) (Oral)   Resp 18   SpO2 97%   Physical Exam Vitals and nursing note reviewed.  Constitutional:      General: He is not in acute distress.    Appearance: He is well-developed.  HENT:     Head: Normocephalic and atraumatic.  Eyes:      General: No scleral icterus.       Right eye: No discharge.        Left eye: No discharge.     Conjunctiva/sclera: Conjunctivae normal.     Pupils: Pupils are equal, round, and reactive to light.  Cardiovascular:     Rate and Rhythm: Normal rate.  Pulmonary:     Effort: Pulmonary effort is normal. No respiratory distress.  Abdominal:     General: There is no distension.  Musculoskeletal:     Cervical back: Normal range of motion.     Comments: Left middle finger: Superficial avulsion of the middle finger tip. Bleeding is controlled. Nail is intact.  Skin:    General: Skin is warm and dry.  Neurological:     Mental Status: He is alert and oriented to person, place, and time.  Psychiatric:        Behavior: Behavior normal.       ED Results / Procedures / Treatments   Labs (all labs ordered are listed, but only abnormal results are displayed) Labs Reviewed - No data to display  EKG None  Radiology No results found.  Procedures Procedures (including critical care time)  Medications Ordered in ED Medications  Tdap (BOOSTRIX) injection 0.5 mL (has no administration in time range)    ED Course  I have reviewed the triage vital signs and the nursing notes.  Pertinent labs & imaging results that were available during my care of the patient were reviewed by me and considered in my medical decision making (see chart for details).  54 year old male presents with superficial finger tip skin avulsion due to a mandolin. Wound was inspected and bleeding is controlled at this time. Wound is not deep enough I am concerned about any bony involvement therefore xray deferred. Wound was re-dressed and he was given a finger splint per his request. Tdap updated. Wound care was discussed.  MDM Rules/Calculators/A&P                       Final Clinical Impression(s) / ED Diagnoses Final diagnoses:  Avulsion of finger tip, initial encounter    Rx / DC Orders ED Discharge Orders     None       Recardo Evangelist, PA-C 04/14/20 Leonides Schanz    Veryl Speak, MD 04/14/20 [phone]

## 2020-04-16 ENCOUNTER — Telehealth: Payer: Self-pay | Admitting: Gastroenterology

## 2020-04-16 NOTE — Telephone Encounter (Signed)
Jared [name] called requested an order to be changed due to the patient having medicaid 2ndary the will not cover the [place] only the injectafer

## 2020-04-16 NOTE — Telephone Encounter (Signed)
New treatment plan faxed to [facility] with [facility] order. Left message for Webb Silversmith on her voicemail.

## 2020-04-18 NOTE — Telephone Encounter (Signed)
Spoke with pt and let him know he is supposed to be receiving Iron infusions. [facility] had called him to set up the appts and he wanted to make sure he was getting the correct medication. Discussed with him he will be getting injectafer a form of IV iron. Pt verbalized understanding.

## 2020-04-18 NOTE — Telephone Encounter (Signed)
Patient is calling states he thought he was going for a calcium infusion not iron please advise

## 2020-04-22 LAB — CMV IGM: CMV IgM: <30 AU/mL

## 2020-04-22 LAB — EPSTEIN-BARR VIRUS VCA, IGG: EBV VCA IgG: >750 U/mL — ABNORMAL HIGH

## 2020-04-22 LAB — RSV(RESPIRATORY SYNCYTIAL VIRUS) AB, BLOOD: RSV Antibodies: <1:8 {titer}

## 2020-04-22 LAB — EPSTEIN-BARR VIRUS VCA, IGM: EBV VCA IgM: <36 U/mL

## 2020-04-23 DIAGNOSIS — D509 Iron deficiency anemia, unspecified: Secondary | ICD-10-CM | POA: Diagnosis not present

## 2020-04-23 DIAGNOSIS — K5090 Crohn's disease, unspecified, without complications: Secondary | ICD-10-CM | POA: Diagnosis not present

## 2020-04-30 DIAGNOSIS — K5090 Crohn's disease, unspecified, without complications: Secondary | ICD-10-CM | POA: Diagnosis not present

## 2020-04-30 DIAGNOSIS — D509 Iron deficiency anemia, unspecified: Secondary | ICD-10-CM | POA: Diagnosis not present

## 2020-05-05 ENCOUNTER — Other Ambulatory Visit: Payer: Self-pay | Admitting: Family Medicine

## 2020-05-05 MED FILL — LISINOPRIL 20 MG TABLET: 20 | 90 days supply | Qty: 90 | Fill #0

## 2020-05-20 ENCOUNTER — Other Ambulatory Visit: Payer: Self-pay | Admitting: Family Medicine

## 2020-05-20 MED FILL — LISINOPRIL 20 MG TABLET: 20 | 90 days supply | Qty: 90 | Fill #0

## 2020-05-22 ENCOUNTER — Other Ambulatory Visit: Payer: Self-pay

## 2020-05-22 DIAGNOSIS — D509 Iron deficiency anemia, unspecified: Secondary | ICD-10-CM

## 2020-05-27 ENCOUNTER — Other Ambulatory Visit (INDEPENDENT_AMBULATORY_CARE_PROVIDER_SITE_OTHER): Payer: Medicare HMO

## 2020-05-27 DIAGNOSIS — D509 Iron deficiency anemia, unspecified: Secondary | ICD-10-CM | POA: Diagnosis not present

## 2020-05-27 LAB — IBC + FERRITIN
Ferritin: 166.9 ng/mL (ref 22.0–322.0)
Iron: 47 ug/dL (ref 42–165)
Saturation Ratios: 14.1 % — ABNORMAL LOW (ref 20.0–50.0)
Transferrin: 238 mg/dL (ref 212.0–360.0)

## 2020-05-28 ENCOUNTER — Other Ambulatory Visit: Payer: Self-pay

## 2020-05-28 ENCOUNTER — Other Ambulatory Visit: Payer: Medicare HMO

## 2020-05-28 DIAGNOSIS — R140 Abdominal distension (gaseous): Secondary | ICD-10-CM | POA: Diagnosis not present

## 2020-05-28 DIAGNOSIS — K50112 Crohn's disease of large intestine with intestinal obstruction: Secondary | ICD-10-CM | POA: Diagnosis not present

## 2020-05-28 DIAGNOSIS — K2970 Gastritis, unspecified, without bleeding: Secondary | ICD-10-CM

## 2020-05-28 DIAGNOSIS — R1084 Generalized abdominal pain: Secondary | ICD-10-CM

## 2020-05-30 LAB — CALPROTECTIN, FECAL: Calprotectin, Fecal: 407 ug/g — ABNORMAL HIGH (ref 0–120)

## 2020-06-02 MED FILL — ONGLYZA 5 MG TABLET: 5 | 30 days supply | Qty: 30 | Fill #0

## 2020-06-03 ENCOUNTER — Ambulatory Visit (INDEPENDENT_AMBULATORY_CARE_PROVIDER_SITE_OTHER): Payer: Medicare HMO | Admitting: Family Medicine

## 2020-06-03 ENCOUNTER — Other Ambulatory Visit: Payer: Self-pay

## 2020-06-03 ENCOUNTER — Telehealth: Payer: Self-pay | Admitting: Family Medicine

## 2020-06-03 ENCOUNTER — Encounter: Payer: Self-pay | Admitting: Family Medicine

## 2020-06-03 VITALS — BP 151/53 | HR 53 | Temp 97.7°F | Resp 16 | Ht 74.0 in | Wt 208.0 lb

## 2020-06-03 DIAGNOSIS — M25511 Pain in right shoulder: Secondary | ICD-10-CM

## 2020-06-03 DIAGNOSIS — I10 Essential (primary) hypertension: Secondary | ICD-10-CM | POA: Diagnosis not present

## 2020-06-03 DIAGNOSIS — R21 Rash and other nonspecific skin eruption: Secondary | ICD-10-CM

## 2020-06-03 DIAGNOSIS — F17200 Nicotine dependence, unspecified, uncomplicated: Secondary | ICD-10-CM

## 2020-06-03 DIAGNOSIS — E1140 Type 2 diabetes mellitus with diabetic neuropathy, unspecified: Secondary | ICD-10-CM

## 2020-06-03 LAB — POCT URINALYSIS DIPSTICK
Bilirubin, UA: NEGATIVE
Blood, UA: NEGATIVE
Glucose, UA: POSITIVE — AB
Leukocytes, UA: NEGATIVE
Nitrite, UA: NEGATIVE
Protein, UA: POSITIVE — AB
Spec Grav, UA: 1.025 (ref 1.010–1.025)
Urobilinogen, UA: 1 E.U./dL
pH, UA: 7 (ref 5.0–8.0)

## 2020-06-03 LAB — POCT GLYCOSYLATED HEMOGLOBIN (HGB A1C): Hemoglobin A1C: 7.1 % — AB (ref 4.0–5.6)

## 2020-06-03 LAB — GLUCOSE, POCT (MANUAL RESULT ENTRY): POC Glucose: 165 mg/dl — AB (ref 70–99)

## 2020-06-03 MED ORDER — ATORVASTATIN CALCIUM 10 MG PO TABS: 10.0000 mg | ORAL_TABLET | Freq: Every day | ORAL | 3 refills | Status: DC

## 2020-06-03 MED ORDER — SARNA 0.5-0.5 % EX LOTN: 1.0000 "application " | TOPICAL_LOTION | CUTANEOUS | 0 refills | Status: DC | PRN

## 2020-06-03 MED ORDER — LISINOPRIL 20 MG PO TABS: 20.0000 mg | ORAL_TABLET | Freq: Every day | ORAL | 3 refills | Status: DC

## 2020-06-03 MED ORDER — TRAMADOL HCL 50 MG PO TABS: 50.0000 mg | ORAL_TABLET | Freq: Three times a day (TID) | ORAL | 0 refills | Status: DC | PRN

## 2020-06-03 MED ORDER — LINAGLIPTIN 5 MG PO TABS: 5.0000 mg | ORAL_TABLET | Freq: Every day | ORAL | 1 refills | Status: DC

## 2020-06-03 MED ORDER — HYDROXYZINE HCL 10 MG PO TABS: 10.0000 mg | ORAL_TABLET | Freq: Three times a day (TID) | ORAL | 0 refills | Status: DC | PRN

## 2020-06-03 MED FILL — ATORVASTATIN 10 MG TABLET: 10 | 90 days supply | Qty: 90 | Fill #0

## 2020-06-03 MED FILL — TRADJENTA 5 MG TABLET: 5 | 90 days supply | Qty: 90 | Fill #0

## 2020-06-03 MED FILL — hydrOXYzine HCL 10 MG TABS: 10 | 10 days supply | Qty: 30 | Fill #0

## 2020-06-03 NOTE — Telephone Encounter (Signed)
Patient called back asking for a referral for eye dr. Lilian Coma you please put this in?

## 2020-06-03 NOTE — Patient Instructions (Signed)
Tobacco Use Disorder Tobacco use disorder (TUD) occurs when a person craves, seeks, and uses tobacco, regardless of the consequences. This disorder can cause problems with mental and physical health. It can affect your ability to have healthy relationships, and it can keep you from meeting your responsibilities at work, home, or school. Tobacco may be:  Smoked as a cigarette or cigar.  Inhaled using e-cigarettes.  Smoked in a pipe or hookah.  Chewed as smokeless tobacco.  Inhaled into the nostrils as snuff. Tobacco products contain a dangerous chemical called nicotine, which is very addictive. Nicotine triggers hormones that make the body feel stimulated and works on areas of the brain that make you feel good. These effects can make it hard for people to quit nicotine. Tobacco contains many other unsafe chemicals that can damage almost every organ in the body. Smoking tobacco also puts others in danger due to fire risk and possible health problems caused by breathing in secondhand smoke. What are the signs or symptoms? Symptoms of TUD may include:  Being unable to slow down or stop your tobacco use.  Spending an abnormal amount of time getting or using tobacco.  Craving tobacco. Cravings may last for up to 6 months after quitting.  Tobacco use that: ? Interferes with your work, school, or home life. ? Interferes with your personal and social relationships. ? Makes you give up activities that you once enjoyed or found important.  Using tobacco even though you know that it is: ? Dangerous or bad for your health or someone else's health. ? Causing problems in your life.  Needing more and more of the substance to get the same effect (developing tolerance).  Experiencing unpleasant symptoms if you do not use the substance (withdrawal). Withdrawal symptoms may include: ? Depressed, anxious, or irritable mood. ? Difficulty concentrating. ? Increased appetite. ? Restlessness or trouble  sleeping.  Using the substance to avoid withdrawal. How is this diagnosed? This condition may be diagnosed based on:  Your current and past tobacco use. Your health care provider may ask questions about how your tobacco use affects your life.  A physical exam. You may be diagnosed with TUD if you have at least two symptoms within a 65-monthperiod. How is this treated? This condition is treated by stopping tobacco use. Many people are unable to quit on their own and need help. Treatment may include:  Nicotine replacement therapy (NRT). NRT provides nicotine without the other harmful chemicals in tobacco. NRT gradually lowers the dosage of nicotine in the body and reduces withdrawal symptoms. NRT is available as: ? Over-the-counter gums, lozenges, and skin patches. ? Prescription mouth inhalers and nasal sprays.  Medicine that acts on the brain to reduce cravings and withdrawal symptoms.  A type of talk therapy that examines your triggers for tobacco use, how to avoid them, and how to cope with cravings (behavioral therapy).  Hypnosis. This may help with withdrawal symptoms.  Joining a support group for others coping with TUD. The best treatment for TUD is usually a combination of medicine, talk therapy, and support groups. Recovery can be a long process. Many people start using tobacco again after stopping (relapse). If you relapse, it does not mean that treatment will not work. Follow these instructions at home:  Lifestyle  Do not use any products that contain nicotine or tobacco, such as cigarettes and e-cigarettes.  Avoid things that trigger tobacco use as much as you can. Triggers include people and situations that usually cause you  to use tobacco.  Avoid drinks that contain caffeine, including coffee. These may worsen some withdrawal symptoms.  Find ways to manage stress. Wanting to smoke may cause stress, and stress can make you want to smoke. Relaxation techniques such as  deep breathing, meditation, and yoga may help.  Attend support groups as needed. These groups are an important part of long-term recovery for many people. General instructions  Take over-the-counter and prescription medicines only as told by your health care provider.  Check with your health care provider before taking any new prescription or over-the-counter medicines.  Decide on a friend, family member, or smoking quit-line (such as 1-800-QUIT-NOW in the [place] that you can call or text when you feel the urge to smoke or when you need help coping with cravings.  Keep all follow-up visits as told by your health care provider and therapist. This is important. Contact a health care provider if:  You are not able to take your medicines as prescribed.  Your symptoms get worse, even with treatment. Summary  Tobacco use disorder (TUD) occurs when a person craves, seeks, and uses tobacco regardless of the consequences.  This condition may be diagnosed based on your current and past tobacco use and a physical exam.  Many people are unable to quit on their own and need help. Recovery can be a long process.  The most effective treatment for TUD is usually a combination of medicine, talk therapy, and support groups. This information is not intended to replace advice given to you by your health care provider. Make sure you discuss any questions you have with your health care provider. Document Revised: 10/19/2017 Document Reviewed: 10/19/2017 Elsevier Patient Education  2020 [employer].

## 2020-06-03 NOTE — Progress Notes (Signed)
[facility]                                   Established Patient Office Visit  Subjective:  Patient ID: Jared [name], male    DOB: [birth year]  Age: 54 y.o. MRN: [id number]  CC:  Chief Complaint  Patient presents with  . Diabetes  . Hypertension  . Shoulder Pain    right shoulder pain   . 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  Patient says that he has been doing fairly well.  His primary complaint on today is right shoulder pain that has been ongoing over the past several months.  Pain has not been improved by over-the-counter Tylenol and/or ibuprofen.  Patient states that he continues to have weakness and daily pain to right shoulder.  Right shoulder pain is aggravated by lifting heavy objects and repetitive movements.  Pain intensity is 6/10 characterized as intermittent and aching.  Patient also has a history of uncontrolled diabetes mellitus.  His previous hemoglobin A1c was 9.1.  Patient says that he has been taking all medications consistently and following up low-fat, low carbohydrate diet.  He has not been exercising, but has increased daily physical activity.  He denies any frequent urination, increased thirst, increased hunger, dizziness, or paresthesias. Patient also has a history of hypertension.  He does not check blood pressures at home.  He has been taking medications consistently, but did not take blood pressure medication prior to this appointment.  He denies lower extremity swelling, headache, blurry vision, shortness of breath, or chest pain.  Past Medical History:  Diagnosis Date  . Crohn disease ([place]) 2008   with hospital admission in  2011, and 8/12 for flare up and SBO relieved with bowel rest. no suregery, no meds for CD  . Diabetes mellitus type II   . Hypertension     Past Surgical History:  Procedure Laterality Date  . BOWEL RESECTION  01/25/2012   Procedure: SMALL BOWEL RESECTION;  Surgeon: [name], MD;  Location: [facility];  Service: General;  Laterality: N/A;  . 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 LAPAROTOMY  01/25/2012   Procedure: EXPLORATORY LAPAROTOMY;  Surgeon: [name], MD;  Location: [facility];  Service: General;  Laterality: N/A;  . SCROTAL SURGERY  ~ 67   "took out a pellet"    Family History  Problem Relation Age of Onset  . Cancer Mother        lung  . Diabetes Mother   . Alopecia Mother   . Coronary artery disease Mother   . Diabetes Sister   . Hyperlipidemia Sister   . Hypertension Sister   . Angina Brother   . Hepatitis Brother   . Alcohol abuse Brother   . Diabetes Brother   . Colon cancer Paternal Grandmother   . Esophageal cancer Neg Hx   . Stomach cancer Neg Hx   . Rectal cancer Neg Hx     Social History   Socioeconomic History  . Marital status: Single    Spouse name: Not on file  . Number of children: 0  . Years of education: Not on file  .  Highest education level: Not on file  Occupational History  . Not on file  Tobacco Use  . Smoking status: Current Every Day Smoker    Packs/day: 0.25    Years: 30.00    Pack years: 7.50    Types: Cigarettes  . Smokeless tobacco: Former Systems developer    Types: [place] date: 01/20/1997  Vaping Use  . Vaping Use: Never used  Substance and Sexual Activity  . Alcohol use: Not Currently  . Drug use: No  . Sexual activity: Not Currently  Other Topics Concern  . Not on file  Social History Narrative   Lives in [place].Is single. Has a sister in [place]. He has a twin brother that passed away from autoimmune hepatitis.  Smokes 2-3 cigarettes a day. Drinks beer once every few months. No history of  drug abuse. Studied up to 12th grade. Has orange card. No health insurance. Currently employed cleaning buildings.   Social Determinants of Health   Financial Resource Strain:   . Difficulty of Paying Living Expenses:   Food Insecurity:   . Worried About Charity fundraiser in the Last Year:   . Arboriculturist in the Last Year:   Transportation Needs:   . Film/video editor (Medical):   Marland Kitchen Lack of Transportation (Non-Medical):   Physical Activity:   . Days of Exercise per Week:   . Minutes of Exercise per Session:   Stress:   . Feeling of Stress :   Social Connections:   . Frequency of Communication with Friends and Family:   . Frequency of Social Gatherings with Friends and Family:   . Attends Religious Services:   . Active Member of Clubs or Organizations:   . Attends Archivist Meetings:   Marland Kitchen Marital Status:   Intimate Partner Violence:   . Fear of Current or Ex-Partner:   . Emotionally Abused:   Marland Kitchen Physically Abused:   . Sexually Abused:     Outpatient Medications Prior to Visit  Medication Sig Dispense Refill  . Blood Glucose Monitoring Suppl (TRUE METRIX METER) w/Device KIT 1 each by Does not apply route 4 (four) times daily -  before meals and at bedtime. 1 kit 0  . budesonide (ENTOCORT EC) 3 MG 24 hr capsule Take 3 capsules (9 mg total) by mouth daily. 90 capsule 6  . glucose blood (TRUE METRIX BLOOD GLUCOSE TEST) test strip Use as instructed 100 each 12  . hydrOXYzine (ATARAX/VISTARIL) 10 MG tablet Take 1 tablet (10 mg total) by mouth 3 (three) times daily as needed for anxiety. 90 tablet 3  . metFORMIN (GLUCOPHAGE) 1000 MG tablet TAKE 1 TABLET BY MOUTH 2 TIMES DAILY WITH A MEAL. 180 tablet 2  . LORazepam (ATIVAN) 0.5 MG tablet Take 0.5 tablets (0.25 mg total) by mouth 2 (two) times daily as needed for anxiety. (Patient not taking: Reported on 06/03/2020) 10 tablet 0  . varenicline (CHANTIX CONTINUING MONTH PAK) 1 MG tablet Take 1 tablet (1 mg total) by  mouth 2 (two) times daily. (Patient not taking: Reported on 06/03/2020) 56 tablet 0  . atorvastatin (LIPITOR) 10 MG tablet Take 1 tablet (10 mg total) by mouth daily. (Patient not taking: Reported on 06/03/2020) 90 tablet 3  . lisinopril (ZESTRIL) 20 MG tablet TAKE 1 TABLET (20 MG TOTAL) BY MOUTH DAILY. (Patient not taking: Reported on 06/03/2020) 90 tablet 3  . omeprazole (PRILOSEC) 40 MG capsule Take 1 capsule (40 mg total) by mouth daily. (  Patient not taking: Reported on 06/03/2020) 30 capsule 1  . ondansetron (ZOFRAN ODT) 4 MG disintegrating tablet 70m ODT q4 hours prn nausea/vomit (Patient taking differently: Take 4 mg by mouth every 4 (four) hours as needed for nausea or vomiting. ) 10 tablet 0  . ONGLYZA 5 MG TABS tablet TAKE 1 TABLET (5 MG TOTAL) BY MOUTH DAILY. (Patient not taking: Reported on 06/03/2020) 60 tablet 3   No facility-administered medications prior to visit.    Allergies  Allergen Reactions  . Trazodone And Nefazodone     hallucinations    ROS Review of Systems  Constitutional: Negative.  Negative for fatigue and unexpected weight change.  HENT: Negative.   Eyes: Negative.   Respiratory: Negative.   Cardiovascular: Negative.   Gastrointestinal: Negative.   Endocrine: Negative.  Negative for polydipsia, polyphagia and polyuria.  Genitourinary: Negative.   Musculoskeletal: Positive for arthralgias and back pain.  Skin: Negative.   Neurological: Negative.   Hematological: Negative.   Psychiatric/Behavioral: Negative.       Objective:    Physical Exam Constitutional:      Appearance: Normal appearance.  Cardiovascular:     Rate and Rhythm: Normal rate and regular rhythm.     Pulses: Normal pulses.  Pulmonary:     Effort: Pulmonary effort is normal.     Breath sounds: Normal breath sounds.  Abdominal:     General: Abdomen is flat. Bowel sounds are normal. There is no distension.     Tenderness: There is no abdominal tenderness.  Neurological:     General:  No focal deficit present.     Mental Status: He is alert. Mental status is at baseline.  Psychiatric:        Mood and Affect: Mood normal.        Thought Content: Thought content normal.        Judgment: Judgment normal.     BP (!) 151/53 (BP Location: Left Arm, Patient Position: Sitting, Cuff Size: Large)   Pulse (!) 53   Temp 97.7 F (36.5 C) (Oral)   Resp 16   Ht 6' 2"  (1.88 m)   Wt 208 lb (94.3 kg)   SpO2 100%   BMI 26.71 kg/m  Wt Readings from Last 3 Encounters:  06/03/20 208 lb (94.3 kg)  04/09/20 212 lb (96.2 kg)  03/04/20 210 lb (95.3 kg)     Health Maintenance Due  Topic Date Due  . COVID-19 Vaccine (1) Never done    There are no preventive care reminders to display for this patient.  No results found for: TSH Lab Results  Component Value Date   WBC 7.1 02/23/2020   HGB 12.3 (L) 02/23/2020   HCT 39.5 02/23/2020   MCV 83.0 02/23/2020   PLT 332 02/23/2020   Lab Results  Component Value Date   NA 142 03/04/2020   K 4.4 03/04/2020   CO2 22 03/04/2020   GLUCOSE 274 (H) 03/04/2020   BUN 12 03/04/2020   CREATININE 0.95 03/04/2020   BILITOT 0.3 03/04/2020   ALKPHOS 87 03/04/2020   AST 15 03/04/2020   ALT 32 03/04/2020   PROT 6.4 03/04/2020   ALBUMIN 4.0 03/04/2020   CALCIUM 9.2 03/04/2020   ANIONGAP 12 02/23/2020   Lab Results  Component Value Date   CHOL 100 07/19/2019   Lab Results  Component Value Date   HDL 38 (L) 07/19/2019   Lab Results  Component Value Date   LDLCALC 48 07/19/2019   Lab Results  Component  Value Date   TRIG 61 07/19/2019   Lab Results  Component Value Date   CHOLHDL 2.6 07/19/2019   Lab Results  Component Value Date   HGBA1C 7.1 (A) 06/03/2020      Assessment & Plan:   Problem List Items Addressed This Visit      Cardiovascular and Mediastinum   HTN (hypertension)   Relevant Medications   lisinopril (ZESTRIL) 20 MG tablet   atorvastatin (LIPITOR) 10 MG tablet   Other Relevant Orders   Urinalysis  Dipstick (Completed)   Basic Metabolic Panel     Endocrine   DM (diabetes mellitus) ([place]) - Primary   Relevant Medications   lisinopril (ZESTRIL) 20 MG tablet   linagliptin (TRADJENTA) 5 MG TABS tablet   atorvastatin (LIPITOR) 10 MG tablet   Other Relevant Orders   HgB A1c (Completed)   Glucose (CBG) (Completed)   Basic Metabolic Panel     Other   Tobacco use disorder    Other Visit Diagnoses    Rash       Relevant Medications   camphor-menthol (SARNA) lotion   hydrOXYzine (ATARAX/VISTARIL) 10 MG tablet   Right shoulder pain, unspecified chronicity       Relevant Medications   traMADol (ULTRAM) 50 MG tablet   Other Relevant Orders   AMB referral to orthopedics      Meds ordered this encounter  Medications  . lisinopril (ZESTRIL) 20 MG tablet    Sig: Take 1 tablet (20 mg total) by mouth daily.    Dispense:  90 tablet    Refill:  3    Order Specific Question:   Supervising Provider    Answer:   Tresa Garter [id number]  . linagliptin (TRADJENTA) 5 MG TABS tablet    Sig: Take 1 tablet (5 mg total) by mouth daily.    Dispense:  90 tablet    Refill:  1    Order Specific Question:   Supervising Provider    Answer:   Tresa Garter [id number]  . atorvastatin (LIPITOR) 10 MG tablet    Sig: Take 1 tablet (10 mg total) by mouth daily.    Dispense:  90 tablet    Refill:  3    Order Specific Question:   Supervising Provider    Answer:   Tresa Garter [id number]  . camphor-menthol (SARNA) lotion    Sig: Apply 1 application topically as needed for itching.    Dispense:  222 mL    Refill:  0    Order Specific Question:   Supervising Provider    Answer:   Tresa Garter [id number]  . traMADol (ULTRAM) 50 MG tablet    Sig: Take 1 tablet (50 mg total) by mouth every 8 (eight) hours as needed for up to 5 days.    Dispense:  15 tablet    Refill:  0    Order Specific Question:   Supervising Provider    Answer:   Tresa Garter [id number]  .  hydrOXYzine (ATARAX/VISTARIL) 10 MG tablet    Sig: Take 1 tablet (10 mg total) by mouth 3 (three) times daily as needed.    Dispense:  30 tablet    Refill:  0    Order Specific Question:   Supervising Provider    Answer:   Angelica Chessman E [[id number]]    1. Type 2 diabetes mellitus with diabetic neuropathy, without long-term current use of insulin (HCC) Hemoglobin A1c has improved from 9.1 down to 7.1.  Patient  was congratulated on his efforts to improve A1c.  Advised to continue medication regimen as prescribed.  Also, discussed the importance of following a carbohydrate modified diet. - HgB A1c - Glucose (CBG) - linagliptin (TRADJENTA) 5 MG TABS tablet; Take 1 tablet (5 mg total) by mouth daily.  Dispense: 90 tablet; Refill: 1 - atorvastatin (LIPITOR) 10 MG tablet; Take 1 tablet (10 mg total) by mouth daily.  Dispense: 90 tablet; Refill: 3 - Basic Metabolic Panel - Ambulatory referral to Ophthalmology  2. Essential hypertension BP (!) 151/53 (BP Location: Left Arm, Patient Position: Sitting, Cuff Size: Large)   Pulse (!) 53   Temp 97.7 F (36.5 C) (Oral)   Resp 16   Ht 6' 2"  (1.88 m)   Wt 208 lb (94.3 kg)   SpO2 100%   BMI 26.71 kg/m  - Continue medication, monitor blood pressure at home. Continue DASH diet. Reminder to go to the ER if any CP, SOB, nausea, dizziness, severe HA, changes vision/speech, left arm numbness and tingling and jaw pain.    - Urinalysis Dipstick - lisinopril (ZESTRIL) 20 MG tablet; Take 1 tablet (20 mg total) by mouth daily.  Dispense: 90 tablet; Refill: 3 - Basic Metabolic Panel - Ambulatory referral to Ophthalmology  3. Rash - camphor-menthol (SARNA) lotion; Apply 1 application topically as needed for itching.  Dispense: 222 mL; Refill: 0 - hydrOXYzine (ATARAX/VISTARIL) 10 MG tablet; Take 1 tablet (10 mg total) by mouth 3 (three) times daily as needed.  Dispense: 30 tablet; Refill: 0  4. Right shoulder pain, unspecified chronicity Patient has  ongoing right shoulder pain that has been unrelieved by anti-inflammatories.  He warrants referral to orthopedic specialist for further work-up and evaluation of this problem. - AMB referral to orthopedics  5. Tobacco use disorder Smoking cessation instruction/counseling given:  counseled patient on the dangers of tobacco use, advised patient to stop smoking, and reviewed strategies to maximize success  Follow-up: Return in about 3 months (around 09/03/2020).    [name]  APRN, MSN, FNP-C [facility] [address] [place], [place] [address] [phone]

## 2020-06-04 ENCOUNTER — Telehealth: Payer: Self-pay

## 2020-06-04 ENCOUNTER — Encounter: Payer: Self-pay | Admitting: Gastroenterology

## 2020-06-04 ENCOUNTER — Ambulatory Visit (INDEPENDENT_AMBULATORY_CARE_PROVIDER_SITE_OTHER): Payer: Medicare HMO | Admitting: Gastroenterology

## 2020-06-04 VITALS — BP 120/70 | HR 79 | Ht 74.0 in | Wt 208.0 lb

## 2020-06-04 DIAGNOSIS — R1084 Generalized abdominal pain: Secondary | ICD-10-CM | POA: Diagnosis not present

## 2020-06-04 DIAGNOSIS — D509 Iron deficiency anemia, unspecified: Secondary | ICD-10-CM | POA: Diagnosis not present

## 2020-06-04 DIAGNOSIS — R140 Abdominal distension (gaseous): Secondary | ICD-10-CM | POA: Diagnosis not present

## 2020-06-04 DIAGNOSIS — K50919 Crohn's disease, unspecified, with unspecified complications: Secondary | ICD-10-CM

## 2020-06-04 LAB — BASIC METABOLIC PANEL
BUN/Creatinine Ratio: 13 (ref 9–20)
BUN: 12 mg/dL (ref 6–24)
CO2: 22 mmol/L (ref 20–29)
Calcium: 8.9 mg/dL (ref 8.7–10.2)
Chloride: 107 mmol/L — ABNORMAL HIGH (ref 96–106)
Creatinine, Ser: 0.89 mg/dL (ref 0.76–1.27)
GFR calc Af Amer: 112 mL/min/{1.73_m2} (ref >59)
GFR calc non Af Amer: 97 mL/min/{1.73_m2} (ref >59)
Glucose: 164 mg/dL — ABNORMAL HIGH (ref 65–99)
Potassium: 3.9 mmol/L (ref 3.5–5.2)
Sodium: 144 mmol/L (ref 134–144)

## 2020-06-04 MED ORDER — BUDESONIDE 3 MG PO CPEP: 9.0000 mg | ORAL_CAPSULE | Freq: Every day | ORAL | 6 refills | Status: DC

## 2020-06-04 MED FILL — BUDESONIDE 3 MG CAP: 3 | 30 days supply | Qty: 90 | Fill #0

## 2020-06-04 NOTE — Telephone Encounter (Signed)
Referral sent to [facility].

## 2020-06-04 NOTE — Telephone Encounter (Signed)
Called and advised of labs within normal limits and to keep follow up in 3 months. Patient verbalized understanding.   Jared [name],  Patient states he can't take Tramadol while working and is asking if you could do Tylenol #3 instead? Please advise.

## 2020-06-04 NOTE — Patient Instructions (Addendum)
Continue to avoid NSAID's.   Your provider has requested that you go to the basement level for lab work on 07/05/20. Press "B" on the elevator. The lab is located at the first door on the left as you exit the elevator.   Obtain the Covid vaccine and flu vaccine as recommended by Dr. Tarri Glenn.   Continue Budesonide 70m daily- refill has been sent to your pharmacy.   Dr. BTarri GlennNurse-Linda , will contact you regarding Ustekinumab(Stelara). If you have not heard from our office in 1-2 weeks, please contact office at [phone] ask for Dr. BTarri Glennnurse.   If you are age [age]or older, your body mass index should be between 23-30. Your Body mass index is 26.71 kg/m. If this is out of the aforementioned range listed, please consider follow up with your Primary Care Provider.  If you are age [age]or younger, your body mass index should be between 19-25. Your Body mass index is 26.71 kg/m. If this is out of the aformentioned range listed, please consider follow up with your Primary Care Provider.   Please keep follow-up in 4 weeks.   Thank you for choosing me and [facility].  Dr. BTarri Glenn

## 2020-06-04 NOTE — Telephone Encounter (Signed)
Jared [name], Dr.Beavers would like for Jared [name] to start Ustekinumab 520 mg IV x1 followed by 32mg Bryan Q8 weeks. I informed pt that he would be contacted by office at a later time with instructions.
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
## 2020-06-05 ENCOUNTER — Other Ambulatory Visit: Payer: Self-pay | Admitting: Family Medicine

## 2020-06-05 ENCOUNTER — Telehealth: Payer: Self-pay | Admitting: Family Medicine

## 2020-06-05 DIAGNOSIS — M25511 Pain in right shoulder: Secondary | ICD-10-CM

## 2020-06-05 MED ORDER — ACETAMINOPHEN-CODEINE #3 300-30 MG PO TABS: 1.0000 | ORAL_TABLET | Freq: Three times a day (TID) | ORAL | 0 refills | Status: DC | PRN

## 2020-06-05 NOTE — Telephone Encounter (Signed)
Called, no answer. Left a message that medication has been changed to Tylenol #3 and to stop tramadol. Asked to call back if any questions. Thanks!

## 2020-06-05 NOTE — Progress Notes (Unsigned)
Meds ordered this encounter  Medications  . acetaminophen-codeine (TYLENOL #3) 300-30 MG tablet    Sig: Take 1 tablet by mouth every 8 (eight) hours as needed for moderate pain.    Dispense:  15 tablet    Refill:  0    Order Specific Question:   Supervising Provider    Answer:   Tresa Garter [[id number]]  [name]  APRN, MSN, FNP-C [facility] [address] [place], [place] [address] [phone]

## 2020-06-06 NOTE — Telephone Encounter (Signed)
Done

## 2020-06-17 ENCOUNTER — Telehealth: Payer: Self-pay | Admitting: Family Medicine

## 2020-06-17 NOTE — Telephone Encounter (Signed)
Called and spoke with patient, gave number to ortho referral.

## 2020-06-20 NOTE — Telephone Encounter (Signed)
Jared [name] from [facility] is requesting a call back from a nurse to discuss the order that was sent. New Stelara order received, bc of pt's insurance [facility] will only be able to service IV not sub q, please advise on how provider would like to proceed.  CB [phone] OMV [id number]

## 2020-06-20 NOTE — Telephone Encounter (Signed)
Received call from [place], per the pts insurance pt has to have tried Humira and failed before they will cover the Stelara. [facility] and pt know Dr. Tarri Glenn is out of the office. They both know we will get back to them upon her return. Please advise.

## 2020-06-27 DIAGNOSIS — H524 Presbyopia: Secondary | ICD-10-CM | POA: Diagnosis not present

## 2020-06-27 DIAGNOSIS — H2513 Age-related nuclear cataract, bilateral: Secondary | ICD-10-CM | POA: Diagnosis not present

## 2020-06-27 DIAGNOSIS — H25013 Cortical age-related cataract, bilateral: Secondary | ICD-10-CM | POA: Diagnosis not present

## 2020-06-27 DIAGNOSIS — E119 Type 2 diabetes mellitus without complications: Secondary | ICD-10-CM | POA: Diagnosis not present

## 2020-06-30 MED FILL — metFORMIN HCL 1000 MG TABS: 1000 | 90 days supply | Qty: 180 | Fill #1

## 2020-07-01 ENCOUNTER — Other Ambulatory Visit: Payer: Self-pay | Admitting: Orthopaedic Surgery

## 2020-07-01 ENCOUNTER — Ambulatory Visit (INDEPENDENT_AMBULATORY_CARE_PROVIDER_SITE_OTHER): Payer: Medicare HMO

## 2020-07-01 ENCOUNTER — Ambulatory Visit (INDEPENDENT_AMBULATORY_CARE_PROVIDER_SITE_OTHER): Payer: Medicare HMO | Admitting: Orthopaedic Surgery

## 2020-07-01 ENCOUNTER — Encounter: Payer: Self-pay | Admitting: Orthopaedic Surgery

## 2020-07-01 VITALS — Ht 73.0 in | Wt 209.0 lb

## 2020-07-01 DIAGNOSIS — E119 Type 2 diabetes mellitus without complications: Secondary | ICD-10-CM | POA: Diagnosis not present

## 2020-07-01 DIAGNOSIS — G8929 Other chronic pain: Secondary | ICD-10-CM | POA: Diagnosis not present

## 2020-07-01 DIAGNOSIS — M542 Cervicalgia: Secondary | ICD-10-CM | POA: Diagnosis not present

## 2020-07-01 DIAGNOSIS — Z77018 Contact with and (suspected) exposure to other hazardous metals: Secondary | ICD-10-CM

## 2020-07-01 DIAGNOSIS — M25511 Pain in right shoulder: Secondary | ICD-10-CM | POA: Diagnosis not present
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
## 2020-07-02 ENCOUNTER — Telehealth: Payer: Self-pay | Admitting: Gastroenterology

## 2020-07-02 NOTE — Telephone Encounter (Signed)
Please start Humira 160 mg on day 1, then 80 mg 2 weeks later, than 40 mg 2 weeks later and every 2 weeks. He needs office follow-up with me in 4-6 weeks.  Thank you.

## 2020-07-02 NOTE — Telephone Encounter (Signed)
[facility]  Calling to let Dr. Tarri Glenn know that pts insurance will not cover Stelara. Pt must try and fail Humira before Stelara can be considered. Please advise.

## 2020-07-02 NOTE — Telephone Encounter (Signed)
Addressed in another request.

## 2020-07-03 NOTE — Telephone Encounter (Signed)
Referral faxed to [facility] for Humira. Nurse ambassador form also faxed.

## 2020-07-07 NOTE — Telephone Encounter (Signed)
Spoke with pt, he states [facility] has not received the form

## 2020-07-07 NOTE — Telephone Encounter (Signed)
Called [facility] and they do have the referral, they are working on the Prior auth. Pt aware.

## 2020-07-22 MED FILL — BUDESONIDE 3 MG CAP: 3 | 30 days supply | Qty: 90 | Fill #1

## 2020-07-29 ENCOUNTER — Other Ambulatory Visit: Payer: Medicare HMO

## 2020-07-29 ENCOUNTER — Inpatient Hospital Stay: Admission: RE | Admit: 2020-07-29 | Payer: Medicare HMO | Source: Ambulatory Visit

## 2020-08-03 ENCOUNTER — Other Ambulatory Visit: Payer: Medicare Other

## 2020-08-04 ENCOUNTER — Ambulatory Visit: Payer: Medicare HMO | Admitting: Gastroenterology

## 2020-09-02 ENCOUNTER — Other Ambulatory Visit: Payer: Self-pay | Admitting: Family Medicine

## 2020-09-02 ENCOUNTER — Other Ambulatory Visit: Payer: Self-pay

## 2020-09-02 ENCOUNTER — Ambulatory Visit (INDEPENDENT_AMBULATORY_CARE_PROVIDER_SITE_OTHER): Payer: Medicare Other | Admitting: Family Medicine

## 2020-09-02 ENCOUNTER — Encounter: Payer: Self-pay | Admitting: Family Medicine

## 2020-09-02 VITALS — BP 144/70 | HR 86 | Temp 97.1°F | Ht 73.0 in | Wt 208.0 lb

## 2020-09-02 DIAGNOSIS — E1140 Type 2 diabetes mellitus with diabetic neuropathy, unspecified: Secondary | ICD-10-CM | POA: Diagnosis not present

## 2020-09-02 DIAGNOSIS — I10 Essential (primary) hypertension: Secondary | ICD-10-CM

## 2020-09-02 DIAGNOSIS — F17200 Nicotine dependence, unspecified, uncomplicated: Secondary | ICD-10-CM

## 2020-09-02 LAB — POCT URINALYSIS DIPSTICK
Bilirubin, UA: NEGATIVE
Glucose, UA: POSITIVE — AB
Leukocytes, UA: NEGATIVE
Nitrite, UA: NEGATIVE
Protein, UA: NEGATIVE
Spec Grav, UA: 1.025 (ref 1.010–1.025)
Urobilinogen, UA: 0.2 E.U./dL
pH, UA: 5.5 (ref 5.0–8.0)

## 2020-09-02 LAB — POCT GLYCOSYLATED HEMOGLOBIN (HGB A1C): Hemoglobin A1C: 9 % — AB (ref 4.0–5.6)

## 2020-09-02 MED ORDER — LINAGLIPTIN 5 MG PO TABS: 5.0000 mg | ORAL_TABLET | Freq: Every day | ORAL | 1 refills | Status: DC

## 2020-09-02 NOTE — Patient Instructions (Signed)

## 2020-09-02 NOTE — Progress Notes (Signed)
Patient [facility]    Subjective:  Patient ID: Jared [name], male    DOB: [birth year]  Age: 54 y.o. MRN: [id number]  CC:  Chief Complaint  Patient presents with  . 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 Patient states that he is doing well and is without complaint on today. He has a long history of uncontrolled diabetes.  His most recent hemoglobin A1c was improved at 7.1.  Patient states that he has not been following a carbohydrate modified diet or exercising consistently.  He states that exercise is limited due to ongoing shoulder pain.  He denies any polyuria, polydipsia, or polyphagia.  Patient mostly prepares his own meals.  He endorses numbness and tingling in feet bilaterally.  He denies any blurred vision, chest pain, urinary symptoms, nausea, vomiting, or diarrhea. Jared [name] also has a history of hypertension.  He does not check blood pressure at home.  He takes medication consistently, however he did not take BP medication prior to appointment.  He denies any headache, dizziness, chest pain, or heart palpitations.   Past Medical History:  Diagnosis Date  . Crohn disease ([place]) 2008   with hospital admission in 2011, and 8/12 for flare up and SBO relieved with bowel rest. no suregery, no meds for CD  . Diabetes mellitus type II   . Hypertension     Past Surgical History:  Procedure Laterality Date  . BOWEL RESECTION  01/25/2012   Procedure: SMALL BOWEL RESECTION;  Surgeon: [name], MD;  Location: [facility];  Service: General;  Laterality: N/A;  . 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 LAPAROTOMY  01/25/2012   Procedure: EXPLORATORY LAPAROTOMY;  Surgeon: [name], MD;  Location:  [facility];  Service: General;  Laterality: N/A;  . SCROTAL SURGERY  ~ 23   "took out a pellet"    Family History  Problem Relation Age of Onset  . Cancer Mother        lung  . Diabetes Mother   . Alopecia Mother   . Coronary artery disease Mother   . Diabetes Sister   . Hyperlipidemia Sister   . Hypertension Sister   . Angina Brother   . Hepatitis Brother   . Alcohol abuse Brother   . Diabetes Brother   . Colon cancer Paternal Grandmother   . Esophageal cancer Neg Hx   . Stomach cancer Neg Hx   . Rectal cancer Neg Hx     Social History   Socioeconomic History  . Marital status: Single    Spouse name: Not on file  . Number of children: 0  . Years of education: Not on file  . Highest education level: Not on file  Occupational History  . Not on file  Tobacco Use  . Smoking status: Current Every Day Smoker    Packs/day: 0.25    Years: 30.00    Pack years: 7.50    Types: Cigarettes  . Smokeless tobacco: Former Systems developer    Types: [place] date: 01/20/1997  Vaping Use  . Vaping Use: Never used  Substance and Sexual Activity  . Alcohol use: Not Currently  . Drug use: No  . Sexual activity: Not Currently  Other Topics Concern  .  Not on file  Social History Narrative   Lives in [place].Is single. Has a sister in [place]. He has a twin brother that passed away from autoimmune hepatitis.  Smokes 2-3 cigarettes a day. Drinks beer once every few months. No history of drug abuse. Studied up to 12th grade. Has orange card. No health insurance. Currently employed cleaning buildings.   Social Determinants of Health   Financial Resource Strain:   . Difficulty of Paying Living Expenses: Not on file  Food Insecurity:   . Worried About Charity fundraiser in the Last Year: Not on file  . Ran Out of Food in the Last Year: Not on file  Transportation Needs:   . Lack of Transportation (Medical): Not on file  . Lack of Transportation (Non-Medical): Not on file  Physical  Activity:   . Days of Exercise per Week: Not on file  . Minutes of Exercise per Session: Not on file  Stress:   . Feeling of Stress : Not on file  Social Connections:   . Frequency of Communication with Friends and Family: Not on file  . Frequency of Social Gatherings with Friends and Family: Not on file  . Attends Religious Services: Not on file  . Active Member of Clubs or Organizations: Not on file  . Attends Archivist Meetings: Not on file  . Marital Status: Not on file  Intimate Partner Violence:   . Fear of Current or Ex-Partner: Not on file  . Emotionally Abused: Not on file  . Physically Abused: Not on file  . Sexually Abused: Not on file    Outpatient Medications Prior to Visit  Medication Sig Dispense Refill  . atorvastatin (LIPITOR) 10 MG tablet Take 1 tablet (10 mg total) by mouth daily. 90 tablet 3  . Blood Glucose Monitoring Suppl (TRUE METRIX METER) w/Device KIT 1 each by Does not apply route 4 (four) times daily -  before meals and at bedtime. 1 kit 0  . budesonide (ENTOCORT EC) 3 MG 24 hr capsule Take 3 capsules (9 mg total) by mouth daily. 90 capsule 6  . glucose blood (TRUE METRIX BLOOD GLUCOSE TEST) test strip Use as instructed 100 each 12  . hydrOXYzine (ATARAX/VISTARIL) 10 MG tablet Take 1 tablet (10 mg total) by mouth 3 (three) times daily as needed for anxiety. 90 tablet 3  . hydrOXYzine (ATARAX/VISTARIL) 10 MG tablet Take 1 tablet (10 mg total) by mouth 3 (three) times daily as needed. 30 tablet 0  . lisinopril (ZESTRIL) 20 MG tablet Take 1 tablet (20 mg total) by mouth daily. 90 tablet 3  . LORazepam (ATIVAN) 0.5 MG tablet Take 0.5 tablets (0.25 mg total) by mouth 2 (two) times daily as needed for anxiety. 10 tablet 0  . metFORMIN (GLUCOPHAGE) 1000 MG tablet TAKE 1 TABLET BY MOUTH 2 TIMES DAILY WITH A MEAL. 180 tablet 2  . varenicline (CHANTIX CONTINUING MONTH PAK) 1 MG tablet Take 1 tablet (1 mg total) by mouth 2 (two) times daily. 56 tablet 0  .  acetaminophen-codeine (TYLENOL #3) 300-30 MG tablet Take 1 tablet by mouth every 8 (eight) hours as needed for moderate pain. (Patient not taking: Reported on 09/02/2020) 15 tablet 0  . camphor-menthol (SARNA) lotion Apply 1 application topically as needed for itching. (Patient not taking: Reported on 09/02/2020) 222 mL 0  . linagliptin (TRADJENTA) 5 MG TABS tablet Take 1 tablet (5 mg total) by mouth daily. (Patient not taking: Reported on 09/02/2020) 90 tablet 1  No facility-administered medications prior to visit.    Allergies  Allergen Reactions  . Trazodone And Nefazodone     hallucinations    ROS Review of Systems  Constitutional: Negative for activity change and appetite change.  HENT: Negative.   Cardiovascular: Negative.   Gastrointestinal: Negative.   Endocrine: Negative for polydipsia, polyphagia and polyuria.  Genitourinary: Negative.   Musculoskeletal: Positive for arthralgias.  Neurological: Negative.   Hematological: Negative.   Psychiatric/Behavioral: Negative.       Objective:    Physical Exam Constitutional:      Appearance: Normal appearance.  HENT:     Mouth/Throat:     Mouth: Mucous membranes are moist.  Eyes:     Pupils: Pupils are equal, round, and reactive to light.  Cardiovascular:     Rate and Rhythm: Normal rate and regular rhythm.     Pulses: Normal pulses.  Pulmonary:     Effort: Pulmonary effort is normal.  Abdominal:     General: Abdomen is flat. Bowel sounds are normal.  Musculoskeletal:        General: Normal range of motion.  Skin:    General: Skin is warm.  Neurological:     General: No focal deficit present.     Mental Status: He is alert. Mental status is at baseline.  Psychiatric:        Mood and Affect: Mood normal.        Behavior: Behavior normal.        Thought Content: Thought content normal.        Judgment: Judgment normal.     BP (!) 144/70 (BP Location: Left Arm, Patient Position: Sitting, Cuff Size: Normal)    Pulse 86   Temp (!) 97.1 F (36.2 C) (Temporal)   Ht 6' 1"  (1.854 m)   Wt 208 lb (94.3 kg)   SpO2 96%   BMI 27.44 kg/m  Wt Readings from Last 3 Encounters:  09/02/20 208 lb (94.3 kg)  07/01/20 209 lb (94.8 kg)  06/04/20 208 lb (94.3 kg)     Health Maintenance Due  Topic Date Due  . COVID-19 Vaccine (1) Never done    There are no preventive care reminders to display for this patient.  No results found for: TSH Lab Results  Component Value Date   WBC 7.1 02/23/2020   HGB 12.3 (L) 02/23/2020   HCT 39.5 02/23/2020   MCV 83.0 02/23/2020   PLT 332 02/23/2020   Lab Results  Component Value Date   NA 144 06/03/2020   K 3.9 06/03/2020   CO2 22 06/03/2020   GLUCOSE 164 (H) 06/03/2020   BUN 12 06/03/2020   CREATININE 0.89 06/03/2020   BILITOT 0.3 03/04/2020   ALKPHOS 87 03/04/2020   AST 15 03/04/2020   ALT 32 03/04/2020   PROT 6.4 03/04/2020   ALBUMIN 4.0 03/04/2020   CALCIUM 8.9 06/03/2020   ANIONGAP 12 02/23/2020   Lab Results  Component Value Date   CHOL 100 07/19/2019   Lab Results  Component Value Date   HDL 38 (L) 07/19/2019   Lab Results  Component Value Date   LDLCALC 48 07/19/2019   Lab Results  Component Value Date   TRIG 61 07/19/2019   Lab Results  Component Value Date   CHOLHDL 2.6 07/19/2019   Lab Results  Component Value Date   HGBA1C 9.0 (A) 09/02/2020      Assessment & Plan:   Problem List Items Addressed This Visit  Endocrine   DM (diabetes mellitus) ([facility]) - Primary   Relevant Orders   POCT HgB A1C (Completed)      1. Type 2 diabetes mellitus with diabetic neuropathy, without long-term current use of insulin (HCC) Hemoglobin A1c has increased from 7.1-9.0.  Discussed the importance of following a carbohydrate modified diet and adherence to medication regimen in order to achieve positive outcomes.  Patient expressed understanding.  Recommended diabetes and nutrition for assistance, patient declined.  He says he knows what  to eat, is just a matter of doing it. - POCT HgB A1C - Comprehensive metabolic panel - Urinalysis Dipstick - linagliptin (TRADJENTA) 5 MG TABS tablet; Take 1 tablet (5 mg total) by mouth daily.  Dispense: 90 tablet; Refill: 1  2. Essential hypertension BP (!) 144/70 (BP Location: Left Arm, Patient Position: Sitting, Cuff Size: Normal)   Pulse 86   Temp (!) 97.1 F (36.2 C) (Temporal)   Ht 6' 1"  (1.854 m)   Wt 208 lb (94.3 kg)   SpO2 96%   BMI 27.44 kg/m  - Continue medication, monitor blood pressure at home. Continue DASH diet. Reminder to go to the ER if any CP, SOB, nausea, dizziness, severe HA, changes vision/speech, left arm numbness and tingling and jaw pain.    - Urinalysis Dipstick  3. Tobacco use disorder Smoking cessation instruction/counseling given:  counseled patient on the dangers of tobacco use, advised patient to stop smoking, and reviewed strategies to maximize success   Follow-up: Return in about 3 months (around 12/03/2020).     [name]  APRN, MSN, FNP-C Patient [facility] [address] [place], [place] [address] [phone]

## 2020-09-03 LAB — COMPREHENSIVE METABOLIC PANEL
ALT: 17 IU/L (ref 0–44)
AST: 12 IU/L (ref 0–40)
Albumin/Globulin Ratio: 1.8 (ref 1.2–2.2)
Albumin: 4.2 g/dL (ref 3.8–4.9)
Alkaline Phosphatase: 82 IU/L (ref 44–121)
BUN/Creatinine Ratio: 13 (ref 9–20)
BUN: 13 mg/dL (ref 6–24)
Bilirubin Total: 0.3 mg/dL (ref 0.0–1.2)
CO2: 23 mmol/L (ref 20–29)
Calcium: 9.2 mg/dL (ref 8.7–10.2)
Chloride: 104 mmol/L (ref 96–106)
Creatinine, Ser: 1.02 mg/dL (ref 0.76–1.27)
GFR calc Af Amer: 96 mL/min/{1.73_m2} (ref >59)
GFR calc non Af Amer: 83 mL/min/{1.73_m2} (ref >59)
Globulin, Total: 2.3 g/dL (ref 1.5–4.5)
Glucose: 193 mg/dL — ABNORMAL HIGH (ref 65–99)
Potassium: 4.3 mmol/L (ref 3.5–5.2)
Sodium: 144 mmol/L (ref 134–144)
Total Protein: 6.5 g/dL (ref 6.0–8.5)

## 2020-09-04 ENCOUNTER — Telehealth: Payer: Self-pay

## 2020-09-04 ENCOUNTER — Telehealth: Payer: Self-pay | Admitting: Orthopaedic Surgery

## 2020-09-04 NOTE — Telephone Encounter (Signed)
Called  Pt 1X andeeft vm to set MRI review appt with Dr. Erlinda Hong after 11/9.

## 2020-09-04 NOTE — Telephone Encounter (Signed)
Spoke with patient about labs,Trajenta medication hasn't arrived due to mail order pharmacy, but he said should be on the way

## 2020-09-04 NOTE — Telephone Encounter (Signed)
-----   Message from Dorena Dew, [place] sent at 09/03/2020  9:44 PM EDT ----- Regarding: lab results Please inform patient that labs were within a normal range. Remind patient to take medications consistently in order to achieve positive outcomes. Inquire whether patient was able to pick up Trajenta from his pharmacy. Recommend a low fat, low carbohydrate diet divided over small meals throughout the day. Follow up as scheduled  [name]  APRN, MSN, FNP-C Patient [facility] [address] [place], [place] [address] [phone]

## 2020-09-16 ENCOUNTER — Ambulatory Visit (INDEPENDENT_AMBULATORY_CARE_PROVIDER_SITE_OTHER): Payer: Medicare Other | Admitting: Gastroenterology

## 2020-09-16 ENCOUNTER — Other Ambulatory Visit: Payer: Self-pay

## 2020-09-16 ENCOUNTER — Other Ambulatory Visit (INDEPENDENT_AMBULATORY_CARE_PROVIDER_SITE_OTHER): Payer: Medicare Other

## 2020-09-16 ENCOUNTER — Telehealth: Payer: Self-pay

## 2020-09-16 ENCOUNTER — Other Ambulatory Visit: Payer: Self-pay | Admitting: Family Medicine

## 2020-09-16 ENCOUNTER — Encounter: Payer: Self-pay | Admitting: Gastroenterology

## 2020-09-16 VITALS — BP 134/88 | HR 84 | Ht 74.0 in | Wt 209.0 lb

## 2020-09-16 DIAGNOSIS — F419 Anxiety disorder, unspecified: Secondary | ICD-10-CM

## 2020-09-16 DIAGNOSIS — K50919 Crohn's disease, unspecified, with unspecified complications: Secondary | ICD-10-CM

## 2020-09-16 LAB — IBC + FERRITIN
Ferritin: 106.2 ng/mL (ref 22.0–322.0)
Iron: 75 ug/dL (ref 42–165)
Saturation Ratios: 23.2 % (ref 20.0–50.0)
Transferrin: 231 mg/dL (ref 212.0–360.0)

## 2020-09-16 MED ORDER — HUMIRA (2 PEN) 80 MG/0.8ML ~~LOC~~ PNKT: PEN_INJECTOR | SUBCUTANEOUS | 0 refills | Status: DC

## 2020-09-16 MED ORDER — HUMIRA (2 PEN) 40 MG/0.4ML ~~LOC~~ AJKT: 40.0000 mg | AUTO-INJECTOR | SUBCUTANEOUS | 11 refills | Status: DC

## 2020-09-16 NOTE — Progress Notes (Signed)
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    -25 cm of terminal ileum, IC valve, and 5 cm of cecum were resected       -  active Crohn's ileitis on pathology    - has not required any medical therapy since that time until 01/2019    - no history of extra-GI manifestations of IBD    - 12/2018:  CRP 1.2, ESR 52, and fecal calprotectin 282    - 03/21/19: CRP 26, ESR 67, fecal calprotectin 213    - 06/15/19: CRP <1, ESR 61, fecal calprotectin pending    - CT enterography 01/02/19: no active Crohn's disease       -  stable surgical changes at the ileocolonic anastomosis       - stable cluster of lymph nodes in the small bowel mesentery    - Ileocolonic anastomoses ulcers on colonoscopy 01/15/19       - normal colonic mucosa biopsies    - treated with budesonide given ongoing symptoms    - admitted 02/18/20 for abdominal pain, nausea, and distnsion       - treated with IV steroids and bowel rest    - CT abd/pelvis with contrast 02/18/20: active Crohn's disease in the distal small bowel 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 Insurance refused ustekinumab. We are working to start Humira. Reviewed risks and benefits with Jared [name] in follow-up to our prior discussions about treatment on previous visits. Plan referral to tertiary care center for endoscopic dilation if he does not have complete response with treatment of Crohn's.   Labs today to  follow-up on his iron deficiency.     PLAN: - Continue to avoid NSAIDs - Fecal calprotectin, iron, and ferritin - Obtain the Covid vaccine, flu vaccine - Continue budesonide 9 mg daily - Start Humira 160 mg on day 1, then 80 mg 2 weeks later, than 40 mg 2 weeks later and every 2 weeks. He needs office follow-up with me in 4-6 weeks - Plan colonoscopy in 6 months to assess response to treatment - Follow-up with me in 4-6 weeks, earlier if needed   HPI: Jared [name] is a 54 y.o. male with Crohn's disease and abdominal pain.  The interval history is obtained through the patient and review of his electronic health record. 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   Returns in follow-up. He has not yet started any biologic due to confusion with his insurance and our office. Therefore, it is no surprise that he continues to have intermittent, sharp, non-radiating right-sided abdominal pain with associated gas and bloating. It is unchanged since history last appointment.   Having solid bowel movements the last 2-3 days.  No blood or mucous in the stool. Appetite is stable. Weight is stable.   No new complaints today.   Continues to refuse both the Covid and flu vaccine. No prior vaccination for HAV or HBV. No Pneumovax.  Had chicken pox as a  child.   Fecal calprotectin 03/07/2020 was 211 and increased to 4077/14/21.  It had been 57 9 months ago and 213 a year ago prior to starting budesonide. Labs 03/04/2020 show hemoglobin A1c of 9.1 Labs 04/09/2020 showed iron 46, ferritin 10.1, CRP 6.180, ESR 57.  Screening for chronic hepatitis B and C was negative.  Quant interferon TB Gold negative.  Testing for EBV RSV and Covid negative.  He has antibodies to varicella. Labs 05/27/2020 showing iron of 47, ferritin 166.9 Labs 05/28/20: fecal calprotectin 407 Labs 09/02/20: normal CMP except for glucose 193,   I have personally reviewed his CT scan abd/pelvis with contrast from 4/6/21showing surgical  changes at the ileocecal junction. Possible active crohns disease. Low grade obstruction versus focal ileus.    Past Medical History:  Diagnosis Date  . Crohn disease ([facility]) 2008   with hospital admission in 2011, and 8/12 for flare up and SBO relieved with bowel rest. no suregery, no meds for CD  . Diabetes mellitus type II   . Hypertension     Past Surgical History:  Procedure Laterality Date  . BOWEL RESECTION  01/25/2012   Procedure: SMALL BOWEL RESECTION;  Surgeon: [name], MD;  Location: [facility];  Service: General;  Laterality: N/A;  . 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 LAPAROTOMY  01/25/2012   Procedure: EXPLORATORY LAPAROTOMY;  Surgeon: [name], MD;  Location: [facility];  Service: General;  Laterality: N/A;  . SCROTAL SURGERY  ~ 72   "took out a pellet"    Current Outpatient Medications  Medication Sig Dispense Refill  . atorvastatin (LIPITOR) 10 MG tablet Take 1 tablet (10 mg total) by mouth daily. 90 tablet 3  . Blood Glucose Monitoring Suppl (TRUE METRIX METER) w/Device KIT 1 each by Does not apply route 4 (four) times daily -  before meals and at bedtime. 1 kit 0  . budesonide (ENTOCORT EC) 3 MG 24 hr capsule Take 3 capsules (9 mg total) by mouth daily. 90 capsule 6  . glucose blood (TRUE METRIX BLOOD GLUCOSE TEST) test strip Use as instructed 100 each 12  . hydrOXYzine (ATARAX/VISTARIL) 10 MG tablet Take 1 tablet (10 mg total) by mouth 3 (three) times daily as needed for anxiety. 90 tablet 3  . linagliptin (TRADJENTA) 5 MG TABS tablet Take 1 tablet (5 mg total) by mouth daily. 90 tablet 1  . lisinopril (ZESTRIL) 20 MG tablet Take 1 tablet (20 mg total) by mouth daily. 90 tablet 3  . LORazepam (ATIVAN) 0.5 MG tablet Take 0.5 tablets (0.25 mg total) by mouth 2 (two) times daily as needed for anxiety. 10 tablet 0  . metFORMIN (GLUCOPHAGE) 1000 MG tablet TAKE 1 TABLET BY MOUTH 2 TIMES DAILY WITH A MEAL. 180 tablet 2  . varenicline (CHANTIX  CONTINUING MONTH PAK) 1 MG tablet Take 1 tablet (1 mg total) by mouth 2 (two) times daily. 56 tablet 0   No current facility-administered medications for this visit.    Allergies as of 09/16/2020 - Review Complete 09/16/2020  Allergen Reaction Noted  . Trazodone and nefazodone  12/28/2018    Family History  Problem Relation Age of Onset  . Cancer Mother        lung  . Diabetes Mother   . Alopecia Mother   . Coronary artery disease Mother   . Diabetes Sister   . Hyperlipidemia Sister   . Hypertension Sister   . Angina Brother   .  Hepatitis Brother   . Alcohol abuse Brother   . Diabetes Brother   . Colon cancer Paternal Grandmother   . Esophageal cancer Neg Hx   . Stomach cancer Neg Hx   . Rectal cancer Neg Hx     Social History   Socioeconomic History  . Marital status: Single    Spouse name: Not on file  . Number of children: 0  . Years of education: Not on file  . Highest education level: Not on file  Occupational History  . Not on file  Tobacco Use  . Smoking status: Current Every Day Smoker    Packs/day: 0.25    Years: 30.00    Pack years: 7.50    Types: Cigarettes  . Smokeless tobacco: Former Systems developer    Types: [place] date: 01/20/1997  Vaping Use  . Vaping Use: Never used  Substance and Sexual Activity  . Alcohol use: Not Currently  . Drug use: No  . Sexual activity: Not Currently  Other Topics Concern  . Not on file  Social History Narrative   Lives in [place].Is single. Has a sister in [place]. He has a twin brother that passed away from autoimmune hepatitis.  Smokes 2-3 cigarettes a day. Drinks beer once every few months. No history of drug abuse. Studied up to 12th grade. Has orange card. No health insurance. Currently employed cleaning buildings.   Social Determinants of Health   Financial Resource Strain:   . Difficulty of Paying Living Expenses: Not on file  Food Insecurity:   . Worried About Charity fundraiser in the Last Year: Not  on file  . Ran Out of Food in the Last Year: Not on file  Transportation Needs:   . Lack of Transportation (Medical): Not on file  . Lack of Transportation (Non-Medical): Not on file  Physical Activity:   . Days of Exercise per Week: Not on file  . Minutes of Exercise per Session: Not on file  Stress:   . Feeling of Stress : Not on file  Social Connections:   . Frequency of Communication with Friends and Family: Not on file  . Frequency of Social Gatherings with Friends and Family: Not on file  . Attends Religious Services: Not on file  . Active Member of Clubs or Organizations: Not on file  . Attends Archivist Meetings: Not on file  . Marital Status: Not on file  Intimate Partner Violence:   . Fear of Current or Ex-Partner: Not on file  . Emotionally Abused: Not on file  . Physically Abused: Not on file  . Sexually Abused: Not on file    Physical Exam: General: in no acute distress. Appears well.  Neuro: Alert and appropriate Psych: Normal affect and normal insight Abd: Tender RLQ; normal bowel sounds. No rebound or guarding.  Novella Abraha L. Tarri Glenn, MD, MPH [facility] 09/16/2020, 11:18 AM

## 2020-09-16 NOTE — Telephone Encounter (Signed)
[facility]erral was [facility] to [facility] back in August to start Humira. Called [facility] and pt was approved for Humira but per [facility] they have tried to reach him and have not been able to get in touch with him. Called pt and he states he has spoken with Joel[facility] Millin at [facility] and they are waiting on him to call them to set up shipment. Pt states he will call them today. Pt scheduled to see Dr. Tarri Glenn 10/30/20@11 :10am. Appt letter mailed to pt.

## 2020-09-16 NOTE — Telephone Encounter (Signed)
-----   Message from [place], MD sent at 09/16/2020 11:16 AM EDT ----- Please help Jared [name] start Humira 160 mg on day 1, then 80 mg 2 weeks later, than 40 mg 2 weeks later and every 2 weeks. He needs office follow-up with me in 4-6 weeks. This was ordered months ago but he has yet to receive the first injection.

## 2020-09-16 NOTE — Telephone Encounter (Signed)
Please see RX request

## 2020-09-16 NOTE — Patient Instructions (Signed)
If you are age 54 or younger, your body mass index should be between 19-25. Your Body mass index is 26.83 kg/m. If this is out of the aformentioned range listed, please consider follow up with your Primary Care Provider.   LABS: Your provider has requested that you go to the basement level for lab work before leaving today. Press "B" on the elevator. The lab is located at the first door on the left as you exit the elevator.  HEALTHCARE LAWS AND MY CHART RESULTS: Due to recent changes in healthcare laws, you may see the results of your imaging and laboratory studies on MyChart before your provider has had a chance to review them.  We understand that in some cases there may be results that are confusing or concerning to you. Not all laboratory results come back in the same time frame and the provider may be waiting for multiple results in order to interpret others.  Please give [place] 48 hours in order for your provider to thoroughly review all the results before contacting the office for clarification of your results.   Please obtain your Covid Vaccine and Flu Vaccine  PRESCRIPTION MEDICATION(S):    Please continue budesonide 24m every day. Please call our office at ([phone] you need refills.   Humira - The nurse will contact you regarding this medication. We will plan to schedule your colonoscopy in 6 months to assess response to treatment.  Please continue to avoid NSAIDs  Please follow up with me in 4-6 weeks, earlier if needed.

## 2020-09-17 DIAGNOSIS — K50919 Crohn's disease, unspecified, with unspecified complications: Secondary | ICD-10-CM | POA: Diagnosis not present

## 2020-09-17 MED FILL — hydrOXYzine HCL 10 MG TABS: 10 | 10 days supply | Qty: 30 | Fill #0

## 2020-09-22 LAB — CALPROTECTIN: Calprotectin: 680 mcg/g — ABNORMAL HIGH

## 2020-09-22 NOTE — Telephone Encounter (Signed)
Called [facility] and they state they had left a message for pt to call to set up shipment of the medication. Called pt and let him know he has to call [phone] to set up the shipment of the medication, he was given the phone number and verbalized understanding.

## 2020-09-22 NOTE — Telephone Encounter (Cosign Needed)
Patient is calling states he ha snot yet received his Humira medication

## 2020-09-23 ENCOUNTER — Other Ambulatory Visit: Payer: Self-pay

## 2020-09-23 ENCOUNTER — Ambulatory Visit
Admission: RE | Admit: 2020-09-23 | Discharge: 2020-09-23 | Disposition: A | Discharge status: Home / Self Care | Payer: Medicare Other | Source: Ambulatory Visit | Attending: Orthopaedic Surgery | Admitting: Orthopaedic Surgery

## 2020-09-23 DIAGNOSIS — M795 Residual foreign body in soft tissue: Secondary | ICD-10-CM | POA: Diagnosis not present

## 2020-09-23 DIAGNOSIS — Z01818 Encounter for other preprocedural examination: Secondary | ICD-10-CM | POA: Diagnosis not present

## 2020-09-23 DIAGNOSIS — M4802 Spinal stenosis, cervical region: Secondary | ICD-10-CM | POA: Diagnosis not present

## 2020-09-23 DIAGNOSIS — Z77018 Contact with and (suspected) exposure to other hazardous metals: Secondary | ICD-10-CM

## 2020-09-23 DIAGNOSIS — M542 Cervicalgia: Secondary | ICD-10-CM

## 2020-09-26 ENCOUNTER — Ambulatory Visit (INDEPENDENT_AMBULATORY_CARE_PROVIDER_SITE_OTHER): Payer: Medicare Other | Admitting: Orthopaedic Surgery

## 2020-09-26 ENCOUNTER — Encounter: Payer: Self-pay | Admitting: Orthopaedic Surgery

## 2020-09-26 DIAGNOSIS — M4810 Ankylosing hyperostosis [Forestier], site unspecified: Secondary | ICD-10-CM | POA: Diagnosis not present

## 2020-09-26 DIAGNOSIS — G9589 Other specified diseases of spinal cord: Secondary | ICD-10-CM | POA: Diagnosis not present

## 2020-09-26 DIAGNOSIS — M5412 Radiculopathy, cervical region: Secondary | ICD-10-CM | POA: Diagnosis not present
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
## 2020-09-29 ENCOUNTER — Other Ambulatory Visit: Payer: Self-pay

## 2020-09-29 DIAGNOSIS — G9589 Other specified diseases of spinal cord: Secondary | ICD-10-CM

## 2020-09-29 DIAGNOSIS — M5412 Radiculopathy, cervical region: Secondary | ICD-10-CM

## 2020-09-29 DIAGNOSIS — M4810 Ankylosing hyperostosis [Forestier], site unspecified: Secondary | ICD-10-CM

## 2020-10-14 DIAGNOSIS — M542 Cervicalgia: Secondary | ICD-10-CM | POA: Diagnosis not present

## 2020-10-16 ENCOUNTER — Telehealth: Payer: Self-pay | Admitting: Gastroenterology

## 2020-10-16 NOTE — Telephone Encounter (Signed)
Spoke with pt and he is aware that he needs to call [facility] to set up the shipment of the [place].

## 2020-10-16 NOTE — Telephone Encounter (Signed)
Prescription with 11 refills was sent in early November. Called [facility] and they have this on file. Pt needs to call the pharmacy to set up delivery. Attempted to call pt back but unable to reach or leave message.

## 2020-10-16 NOTE — Telephone Encounter (Signed)
Patient called stating he needs a refill for Humira Pen.  Can be sent to [facility].

## 2020-10-22 NOTE — Telephone Encounter (Signed)
Called pt to f/u to ensure he had made arrangements with [facility] for Humira. Unable to reach d/t mailbox being full.

## 2020-10-30 ENCOUNTER — Ambulatory Visit (INDEPENDENT_AMBULATORY_CARE_PROVIDER_SITE_OTHER): Payer: Medicare Other | Admitting: Gastroenterology

## 2020-10-30 ENCOUNTER — Other Ambulatory Visit (INDEPENDENT_AMBULATORY_CARE_PROVIDER_SITE_OTHER): Payer: Medicare Other

## 2020-10-30 ENCOUNTER — Encounter: Payer: Self-pay | Admitting: Gastroenterology

## 2020-10-30 VITALS — BP 110/62 | HR 82 | Ht 74.0 in | Wt 216.0 lb

## 2020-10-30 DIAGNOSIS — D509 Iron deficiency anemia, unspecified: Secondary | ICD-10-CM | POA: Diagnosis not present

## 2020-10-30 DIAGNOSIS — R1084 Generalized abdominal pain: Secondary | ICD-10-CM

## 2020-10-30 DIAGNOSIS — R140 Abdominal distension (gaseous): Secondary | ICD-10-CM

## 2020-10-30 DIAGNOSIS — K50919 Crohn's disease, unspecified, with unspecified complications: Secondary | ICD-10-CM

## 2020-10-30 DIAGNOSIS — M47812 Spondylosis without myelopathy or radiculopathy, cervical region: Secondary | ICD-10-CM | POA: Diagnosis not present

## 2020-10-30 DIAGNOSIS — M4802 Spinal stenosis, cervical region: Secondary | ICD-10-CM | POA: Diagnosis not present

## 2020-10-30 DIAGNOSIS — I10 Essential (primary) hypertension: Secondary | ICD-10-CM | POA: Diagnosis not present

## 2020-10-30 DIAGNOSIS — M5412 Radiculopathy, cervical region: Secondary | ICD-10-CM | POA: Diagnosis not present

## 2020-10-30 LAB — CBC WITH DIFFERENTIAL/PLATELET
Basophils Absolute: 0 10*3/uL (ref 0.0–0.1)
Basophils Relative: 0.3 % (ref 0.0–3.0)
Eosinophils Absolute: 0 10*3/uL (ref 0.0–0.7)
Eosinophils Relative: 0.4 % (ref 0.0–5.0)
HCT: 37.2 % — ABNORMAL LOW (ref 39.0–52.0)
Hemoglobin: 12.3 g/dL — ABNORMAL LOW (ref 13.0–17.0)
Lymphocytes Relative: 25.9 % (ref 12.0–46.0)
Lymphs Abs: 2.4 10*3/uL (ref 0.7–4.0)
MCHC: 33 g/dL (ref 30.0–36.0)
MCV: 91.3 fl (ref 78.0–100.0)
Monocytes Absolute: 0.6 10*3/uL (ref 0.1–1.0)
Monocytes Relative: 6.5 % (ref 3.0–12.0)
Neutro Abs: 6.2 10*3/uL (ref 1.4–7.7)
Neutrophils Relative %: 66.9 % (ref 43.0–77.0)
Platelets: 308 10*3/uL (ref 150.0–400.0)
RBC: 4.07 Mil/uL — ABNORMAL LOW (ref 4.22–5.81)
RDW: 14.5 % (ref 11.5–15.5)
WBC: 9.3 10*3/uL (ref 4.0–10.5)

## 2020-10-30 LAB — IRON: Iron: 59 ug/dL (ref 42–165)

## 2020-10-30 LAB — FERRITIN: Ferritin: 45.1 ng/mL (ref 22.0–322.0)
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
## 2020-10-30 NOTE — Patient Instructions (Signed)
I am so glad that you are feeling better. I have recommended follow-up labs today including a fecal calprotectin.   I recommend that you get the Covid vaccine and the flu vaccine.  Please continue to avoid all non-steroidal anti-inflammatory agents.  Continue your Humira every two weeks. Reduce your budesonide by one pill every two weeks. Our goal is to have you off the budesonide over the next couple of months. However, please let me know if your pain or bloating returns while you are reducing the dose of your budesonide.   Keeping your bones healthy is important. I recommend that you take daily calcium and Vitamin D supplements. Weight bearing exercise is also good for your bones.   I recommend that you go to the [facility] website for additional information about achieving good health.     I would like to see you in 3 months. We will plan a colonoscopy next year.
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
## 2020-11-04 ENCOUNTER — Other Ambulatory Visit: Payer: Medicare Other

## 2020-11-04 ENCOUNTER — Other Ambulatory Visit: Payer: Self-pay | Admitting: Gastroenterology

## 2020-11-04 DIAGNOSIS — R140 Abdominal distension (gaseous): Secondary | ICD-10-CM | POA: Diagnosis not present

## 2020-11-04 DIAGNOSIS — R1084 Generalized abdominal pain: Secondary | ICD-10-CM

## 2020-11-04 DIAGNOSIS — K50919 Crohn's disease, unspecified, with unspecified complications: Secondary | ICD-10-CM | POA: Diagnosis not present

## 2020-11-04 DIAGNOSIS — D509 Iron deficiency anemia, unspecified: Secondary | ICD-10-CM

## 2020-11-06 LAB — CALPROTECTIN, FECAL: Calprotectin, Fecal: 108 ug/g (ref 0–120)

## 2020-11-14 ENCOUNTER — Other Ambulatory Visit: Payer: Self-pay | Admitting: Family Medicine

## 2020-11-14 DIAGNOSIS — F419 Anxiety disorder, unspecified: Secondary | ICD-10-CM

## 2020-11-14 MED FILL — hydrOXYzine HCL 10 MG TABS: 10 | 10 days supply | Qty: 30 | Fill #0

## 2020-11-14 NOTE — Telephone Encounter (Signed)
Is this okay to refill? 

## 2020-11-18 DIAGNOSIS — M47812 Spondylosis without myelopathy or radiculopathy, cervical region: Secondary | ICD-10-CM | POA: Diagnosis not present

## 2020-11-18 DIAGNOSIS — M4802 Spinal stenosis, cervical region: Secondary | ICD-10-CM | POA: Diagnosis not present

## 2020-11-18 DIAGNOSIS — M5412 Radiculopathy, cervical region: Secondary | ICD-10-CM | POA: Diagnosis not present

## 2020-11-18 DIAGNOSIS — I10 Essential (primary) hypertension: Secondary | ICD-10-CM | POA: Diagnosis not present

## 2020-12-01 ENCOUNTER — Other Ambulatory Visit: Payer: Self-pay | Admitting: Family Medicine

## 2020-12-01 DIAGNOSIS — F419 Anxiety disorder, unspecified: Secondary | ICD-10-CM

## 2020-12-01 NOTE — Telephone Encounter (Signed)
Is this okay to refill? 

## 2020-12-02 ENCOUNTER — Telehealth (INDEPENDENT_AMBULATORY_CARE_PROVIDER_SITE_OTHER): Payer: Medicare Other | Admitting: Family Medicine

## 2020-12-02 ENCOUNTER — Other Ambulatory Visit: Payer: Self-pay | Admitting: Family Medicine

## 2020-12-02 VITALS — Ht 74.0 in | Wt 216.0 lb

## 2020-12-02 DIAGNOSIS — E1140 Type 2 diabetes mellitus with diabetic neuropathy, unspecified: Secondary | ICD-10-CM | POA: Diagnosis not present

## 2020-12-02 DIAGNOSIS — I10 Essential (primary) hypertension: Secondary | ICD-10-CM | POA: Diagnosis not present

## 2020-12-02 DIAGNOSIS — F17200 Nicotine dependence, unspecified, uncomplicated: Secondary | ICD-10-CM

## 2020-12-02 DIAGNOSIS — E785 Hyperlipidemia, unspecified: Secondary | ICD-10-CM | POA: Diagnosis not present

## 2020-12-02 MED FILL — hydrOXYzine HCL 10 MG TABS: 10 | 10 days supply | Qty: 30 | Fill #0

## 2020-12-02 NOTE — Progress Notes (Signed)
Virtual Visit via Telephone Note  I connected with Jared [name] on 12/02/20 at  9:20 AM EST by telephone and verified that I am speaking with the correct person using two identifiers.  Location: Patient: Home Provider: [address] [place], [place]    I discussed the limitations, risks, security and privacy concerns of performing an evaluation and management service by telephone and the availability of in person appointments. I also discussed with the patient that there may be a patient responsible charge related to this service. The patient expressed understanding and agreed to proceed.   History of Present Illness:  Jared [name] is a 55 year old male that presents via telephone for follow-up of chronic conditions.  Patient has a medical history significant for type 2 diabetes mellitus, uncontrolled, hypertension, tobacco dependence, and Crohn's disease.  Patient says that he has been doing well and has minimal complaints on today.  He has been taking his medications fairly consistently.  He admits to missing some doses of medicine.  He does not check blood pressure at home and periodically checks blood glucose.  He has not been following a low-fat, low carbohydrate diet.  He states that he made an attempt to order some low-carb meals that are to arrive via delivery.  Patient continues to smoke about 5 to 6 cigarettes/day.  He has made attempts to quit in the past and is not interested in any other interventions at this time.  Diabetes He presents for his follow-up diabetic visit. He has type 2 diabetes mellitus. His disease course has been stable. There are no hypoglycemic associated symptoms. Pertinent negatives for hypoglycemia include no confusion, dizziness, headaches, hunger, nervousness/anxiousness, pallor or seizures. Pertinent negatives for diabetes include no blurred vision, no chest pain, no fatigue, no foot paresthesias, no polydipsia, no polyphagia, no polyuria, no weakness  and no weight loss. Symptoms are stable. Risk factors for coronary artery disease include diabetes mellitus, dyslipidemia, hypertension, male sex, sedentary lifestyle and tobacco exposure. Current diabetic treatment includes diet and oral agent (dual therapy). He is compliant with treatment most of the time. He is following a generally unhealthy diet. When asked about meal planning, he reported none. He has not had a previous visit with a dietitian. He rarely participates in exercise. There is no change in his home blood glucose trend. He does not see a podiatrist.Eye exam is not current.  Hypertension The problem is controlled. Pertinent negatives include no blurred vision, chest pain or headaches. Compliance problems include exercise and diet.  There is no history of kidney disease or heart failure.    Review of Systems  Constitutional: Negative for fatigue and weight loss.  HENT: Negative.   Eyes: Negative for blurred vision.  Respiratory: Negative.   Cardiovascular: Negative for chest pain.  Gastrointestinal: Negative.   Genitourinary: Negative.   Musculoskeletal: Negative.   Skin: Negative for pallor.  Neurological: Negative for dizziness, seizures, weakness and headaches.  Endo/Heme/Allergies: Negative for polydipsia and polyphagia.  Psychiatric/Behavioral: Negative.  Negative for confusion, depression and suicidal ideas. The patient is not nervous/anxious.      Past Medical History:  Diagnosis Date  . Crohn disease ([place]) 2008   with hospital admission in 2011, and 8/12 for flare up and SBO relieved with bowel rest. no suregery, no meds for CD  . Diabetes mellitus type II   . Hypertension    Social History   Socioeconomic History  . Marital status: Single    Spouse name: Not on file  . Number of  children: 0  . Years of education: Not on file  . Highest education level: Not on file  Occupational History  . Not on file  Tobacco Use  . Smoking status: Current Every Day Smoker     Packs/day: 0.25    Years: 30.00    Pack years: 7.50    Types: Cigarettes  . Smokeless tobacco: Former Systems developer    Types: [place] date: 01/20/1997  Vaping Use  . Vaping Use: Never used  Substance and Sexual Activity  . Alcohol use: Not Currently  . Drug use: No  . Sexual activity: Not Currently  Other Topics Concern  . Not on file  Social History Narrative   Lives in [place].Is single. Has a sister in [place]. He has a twin brother that passed away from autoimmune hepatitis.  Smokes 2-3 cigarettes a day. Drinks beer once every few months. No history of drug abuse. Studied up to 12th grade. Has orange card. No health insurance. Currently employed cleaning buildings.   Social Determinants of Health   Financial Resource Strain: Not on file  Food Insecurity: Not on file  Transportation Needs: Not on file  Physical Activity: Not on file  Stress: Not on file  Social Connections: Not on file  Intimate Partner Violence: Not on file   Immunization History  Administered Date(s) Administered  . PFIZER(Purple Top)SARS-COV-2 Vaccination 12/01/2020  . PPD Test 01/24/2012  . Pneumococcal Polysaccharide-23 06/27/2014  . Tdap 06/27/2014, 04/14/2020   Allergies  Allergen Reactions  . Trazodone And Nefazodone     hallucinations    Assessment and Plan:  1. Type 2 diabetes mellitus with diabetic neuropathy, without long-term current use of insulin (HCC) Patient's most recent hemoglobin A1c was 9.0, which is increased from his previous.  Patient advised to take medications consistently, increase fluid intake, and low impact cardiovascular exercise to achieve positive outcomes.  He expressed understanding.  Also, patient asked to bring glucose log to follow-up appointment  2. Essential hypertension - Continue medication, monitor blood pressure at home. Continue DASH diet. Reminder to go to the ER if any CP, SOB, nausea, dizziness, severe HA, changes vision/speech, left arm numbness  and tingling and jaw pain.     3. Dyslipidemia The ASCVD Risk score Mikey Bussing DC Jr., et al., 2013) failed to calculate for the following reasons:   The valid total cholesterol range is 130 to 320 mg/dL   4. Tobacco use disorder Smoking cessation instruction/counseling given:  counseled patient on the dangers of tobacco use, advised patient to stop smoking, and reviewed strategies to maximize success  Follow Up Instructions: Patient to follow-up in office in 3 months for type 2 diabetes mellitus and hypertension.  At that time, will obtain labs.   I discussed the assessment and treatment plan with the patient. The patient was provided an opportunity to ask questions and all were answered. The patient agreed with the plan and demonstrated an understanding of the instructions.   The patient was advised to call back or seek an in-person evaluation if the symptoms worsen or if the condition fails to improve as anticipated.  I provided 8 minutes of non-face-to-face time during this encounter.   [name]  APRN, MSN, FNP-C [facility] [address] [place], [place] [address] [phone]

## 2020-12-14 NOTE — Patient Instructions (Signed)
At follow-up visit, will obtain labs.  We will definitely measure hemoglobin A1c at that time.  Most recent hemoglobin A1c is 9.0, which is above goal.  Also, it is increased from your last A1c.  Recommend that you continue to follow a low-carb low-fat diet.  Also, take prescribed medications consistently.

## 2020-12-31 ENCOUNTER — Other Ambulatory Visit: Payer: Self-pay | Admitting: Family Medicine

## 2020-12-31 DIAGNOSIS — F419 Anxiety disorder, unspecified: Secondary | ICD-10-CM

## 2021-01-01 ENCOUNTER — Other Ambulatory Visit: Payer: Self-pay | Admitting: Family Medicine

## 2021-01-01 MED FILL — hydrOXYzine HCL 10 MG TABS: 10 | 10 days supply | Qty: 30 | Fill #0

## 2021-01-16 ENCOUNTER — Emergency Department (HOSPITAL_COMMUNITY)
Admission: EM | Admit: 2021-01-16 | Discharge: 2021-01-16 | Disposition: A | Discharge status: Home / Self Care | Payer: Medicare Other | Attending: Emergency Medicine | Admitting: Emergency Medicine

## 2021-01-16 ENCOUNTER — Encounter (HOSPITAL_COMMUNITY): Payer: Self-pay

## 2021-01-16 ENCOUNTER — Other Ambulatory Visit: Payer: Self-pay

## 2021-01-16 DIAGNOSIS — Z7982 Long term (current) use of aspirin: Secondary | ICD-10-CM | POA: Insufficient documentation

## 2021-01-16 DIAGNOSIS — Z79899 Other long term (current) drug therapy: Secondary | ICD-10-CM | POA: Insufficient documentation

## 2021-01-16 DIAGNOSIS — E119 Type 2 diabetes mellitus without complications: Secondary | ICD-10-CM | POA: Diagnosis not present

## 2021-01-16 DIAGNOSIS — F17210 Nicotine dependence, cigarettes, uncomplicated: Secondary | ICD-10-CM | POA: Diagnosis not present

## 2021-01-16 DIAGNOSIS — R42 Dizziness and giddiness: Secondary | ICD-10-CM | POA: Diagnosis not present

## 2021-01-16 DIAGNOSIS — I10 Essential (primary) hypertension: Secondary | ICD-10-CM | POA: Diagnosis not present

## 2021-01-16 DIAGNOSIS — Z7984 Long term (current) use of oral hypoglycemic drugs: Secondary | ICD-10-CM | POA: Diagnosis not present

## 2021-01-16 LAB — CBC WITH DIFFERENTIAL/PLATELET
Abs Immature Granulocytes: 0.01 10*3/uL (ref 0.00–0.07)
Basophils Absolute: 0 10*3/uL (ref 0.0–0.1)
Basophils Relative: 0 %
Eosinophils Absolute: 0.1 10*3/uL (ref 0.0–0.5)
Eosinophils Relative: 1 %
HCT: 41 % (ref 39.0–52.0)
Hemoglobin: 13.3 g/dL (ref 13.0–17.0)
Immature Granulocytes: 0 %
Lymphocytes Relative: 22 %
Lymphs Abs: 1.5 10*3/uL (ref 0.7–4.0)
MCH: 30.4 pg (ref 26.0–34.0)
MCHC: 32.4 g/dL (ref 30.0–36.0)
MCV: 93.8 fL (ref 80.0–100.0)
Monocytes Absolute: 0.7 10*3/uL (ref 0.1–1.0)
Monocytes Relative: 10 %
Neutro Abs: 4.6 10*3/uL (ref 1.7–7.7)
Neutrophils Relative %: 67 %
Platelets: 256 10*3/uL (ref 150–400)
RBC: 4.37 MIL/uL (ref 4.22–5.81)
RDW: 13.6 % (ref 11.5–15.5)
WBC: 6.8 10*3/uL (ref 4.0–10.5)
nRBC: 0 % (ref 0.0–0.2)

## 2021-01-16 LAB — BASIC METABOLIC PANEL
Anion gap: 11 (ref 5–15)
BUN: 16 mg/dL (ref 6–20)
CO2: 22 mmol/L (ref 22–32)
Calcium: 8.9 mg/dL (ref 8.9–10.3)
Chloride: 108 mmol/L (ref 98–111)
Creatinine, Ser: 1.08 mg/dL (ref 0.61–1.24)
GFR, Estimated: >60 mL/min (ref >60)
Glucose, Bld: 179 mg/dL — ABNORMAL HIGH (ref 70–99)
Potassium: 4.1 mmol/L (ref 3.5–5.1)
Sodium: 141 mmol/L (ref 135–145)

## 2021-01-16 LAB — TROPONIN I (HIGH SENSITIVITY)
Troponin I (High Sensitivity): 16 ng/L (ref <18)
Troponin I (High Sensitivity): 21 ng/L — ABNORMAL HIGH (ref <18)

## 2021-01-16 NOTE — ED Provider Notes (Signed)
[facility] DEPT Provider Note   CSN: [id number] Arrival date & time: 01/16/21  1240     History Chief Complaint  Patient presents with  . Dizziness    Jared [name] is a 55 y.o. male.  Patient presents ER chief complaint of episodes of lightheadedness.  He states his symptoms been going on for the past 3 to 4 days.  He can walk without difficulty but at times he feels lightheaded and dizzy.  Denies fall or trauma.  Denies headache or back pain or abdominal pain.  He states he had an episode of chest pain earlier today that lasted about 2 to 3 seconds and then resolved.        Past Medical History:  Diagnosis Date  . Crohn disease ([place]) 2008   with hospital admission in 2011, and 8/12 for flare up and SBO relieved with bowel rest. no suregery, no meds for CD  . Diabetes mellitus type II   . Hypertension     Patient Active Problem List   Diagnosis Date Noted  . Ileus ([place]) 02/21/2020  . Acute Crohn's disease with intestinal obstruction ([place]) 02/20/2020  . SBO (small bowel obstruction) ([place])   . Chest pain 11/05/2018  . HTN (hypertension) 11/05/2018  . Cervical spine fracture ([place]) 07/28/2017  . C2 cervical fracture ([place]) 07/28/2017  . Dyslipidemia 09/06/2014  . Left facial swelling 09/06/2014  . Dental caries 09/06/2014  . Crohn's disease ([place]) 06/30/2014  . Tobacco use disorder 06/30/2014  . Diabetes mellitus type II, non insulin dependent ([place]) 06/30/2014  . Crohn disease ([place]) 05/10/2014  . Intra-abdominal abscess ([place]) 02/13/2012  . S/P exploratory laparotomy with ileocecectomy 02/13/2012  . Hypokalemia 02/13/2012  . Hypomagnesemia 02/13/2012  . CD (Crohn's disease) ([place]) 01/20/2012  . DM (diabetes mellitus) ([place]) 01/20/2012    Past Surgical History:  Procedure Laterality Date  . BOWEL RESECTION  01/25/2012   Procedure: SMALL BOWEL RESECTION;  Surgeon: [name], MD;  Location: [facility];  Service: General;  Laterality: N/A;   . 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 LAPAROTOMY  01/25/2012   Procedure: EXPLORATORY LAPAROTOMY;  Surgeon: [name], MD;  Location: [facility];  Service: General;  Laterality: N/A;  . SCROTAL SURGERY  ~ 41   "took out a pellet"       Family History  Problem Relation Age of Onset  . Cancer Mother        lung  . Diabetes Mother   . Alopecia Mother   . Coronary artery disease Mother   . Diabetes Sister   . Hyperlipidemia Sister   . Hypertension Sister   . Angina Brother   . Hepatitis Brother   . Alcohol abuse Brother   . Diabetes Brother   . Colon cancer Paternal Grandmother   . Esophageal cancer Neg Hx   . Stomach cancer Neg Hx   . Rectal cancer Neg Hx     Social History   Tobacco Use  . Smoking status: Current Every Day Smoker    Packs/day: 0.25    Years: 30.00    Pack years: 7.50    Types: Cigarettes  . Smokeless tobacco: Former Systems developer    Types: [place] date: 01/20/1997  Vaping Use  . Vaping Use: Never used  Substance Use Topics  . Alcohol use: Not Currently  . Drug use: No    Home Medications Prior to Admission medications   Medication Sig Start  Date End Date Taking? Authorizing Provider  Adalimumab (HUMIRA PEN) 40 MG/0.4ML PNKT Inject 40 mg into the skin every 14 (fourteen) days. 09/16/20  Yes Thornton Park, MD  aspirin-sod bicarb-citric acid (ALKA-SELTZER) 325 MG TBEF tablet Take 325 mg by mouth every 6 (six) hours as needed (congestion).   Yes [provider]  atorvastatin (LIPITOR) 10 MG tablet Take 1 tablet (10 mg total) by mouth daily. 06/03/20  Yes Dorena Dew, FNP  budesonide (ENTOCORT EC) 3 MG 24 hr capsule Take 3 capsules (9 mg total) by mouth daily. 06/04/20  Yes Thornton Park, MD  hydrOXYzine (ATARAX/VISTARIL) 10 MG tablet TAKE 1 TABLET (10 MG TOTAL) BY MOUTH 3 (THREE) TIMES DAILY AS NEEDED. Patient taking differently: Take 10 mg by mouth 3 (three) times daily as needed for anxiety. 01/01/21  Yes  Dorena Dew, FNP  linagliptin (TRADJENTA) 5 MG TABS tablet Take 1 tablet (5 mg total) by mouth daily. 09/02/20  Yes Dorena Dew, FNP  lisinopril (ZESTRIL) 20 MG tablet Take 1 tablet (20 mg total) by mouth daily. 06/03/20 06/03/21 Yes Dorena Dew, FNP  metFORMIN (GLUCOPHAGE) 1000 MG tablet TAKE 1 TABLET BY MOUTH 2 TIMES DAILY WITH A MEAL. Patient taking differently: Take 1,000 mg by mouth 2 (two) times daily with a meal. 03/26/20  Yes Dorena Dew, FNP  omega-3 acid ethyl esters (LOVAZA) 1 g capsule Take 2 g by mouth 2 (two) times daily. 01/15/21  Yes [provider]  Blood Glucose Monitoring Suppl (TRUE METRIX METER) w/Device KIT 1 each by Does not apply route 4 (four) times daily -  before meals and at bedtime. 06/21/19   Lanae Boast, FNP  glucose blood (TRUE METRIX BLOOD GLUCOSE TEST) test strip Use as instructed 07/06/17   Dorena Dew, FNP  furosemide (LASIX) 10 MG/ML solution Take by mouth daily.  01/20/12  [provider]  mesalamine (PENTASA) 250 MG CR capsule Take 1,000 mg by mouth 4 (four) times daily.  01/20/12  [provider]  ONGLYZA 5 MG TABS tablet TAKE 1 TABLET (5 MG TOTAL) BY MOUTH DAILY. Patient not taking: Reported on 06/03/2020 06/02/20 06/03/20  Dorena Dew, FNP  pioglitazone (ACTOS) 15 MG tablet Take by mouth daily.  01/20/12  [provider]    Allergies    Trazodone and nefazodone  Review of Systems   Review of Systems  Constitutional: Negative for fever.  HENT: Negative for ear pain and sore throat.   Eyes: Negative for pain.  Respiratory: Negative for cough.   Cardiovascular: Negative for chest pain.  Gastrointestinal: Negative for abdominal pain.  Genitourinary: Negative for flank pain.  Musculoskeletal: Negative for back pain.  Skin: Negative for color change and rash.  Neurological: Negative for syncope.  All other systems reviewed and are negative.   Physical Exam Updated Vital Signs BP 123/66 (BP  Location: Left Arm)   Pulse 69   Temp 97.9 F (36.6 C) (Oral)   Resp 20   SpO2 100%   Physical Exam Constitutional:      General: He is not in acute distress.    Appearance: He is well-developed.  HENT:     Head: Normocephalic.     Nose: Nose normal.  Eyes:     Extraocular Movements: Extraocular movements intact.  Cardiovascular:     Rate and Rhythm: Normal rate.  Pulmonary:     Effort: Pulmonary effort is normal.  Skin:    Coloration: Skin is not jaundiced.  Neurological:  General: No focal deficit present.     Mental Status: He is alert. Mental status is at baseline.     Cranial Nerves: No cranial nerve deficit.     Motor: No weakness.     Gait: Gait normal.     Comments: Normal neuro exam.  Normal finger-to-nose heel-to-shin, normal gait unassisted.     ED Results / Procedures / Treatments   Labs (all labs ordered are listed, but only abnormal results are displayed) Labs Reviewed  BASIC METABOLIC PANEL - Abnormal; Notable for the following components:      Result Value   Glucose, Bld 179 (*)    All other components within normal limits  CBC WITH DIFFERENTIAL/PLATELET  TROPONIN I (HIGH SENSITIVITY)  TROPONIN I (HIGH SENSITIVITY)    EKG EKG Interpretation  Date/Time:  Friday January 16 2021 12:57:13 EST Ventricular Rate:  78 PR Interval:    QRS Duration: 96 QT Interval:  384 QTC Calculation: 438 R Axis:   40 Text Interpretation: Sinus rhythm Consider left atrial enlargement Probable inferior infarct, old NSR, no change from previous Confirmed by Lavenia Atlas [phone]) on 01/16/2021 1:58:59 PM   Radiology No results found.  Procedures Procedures   Medications Ordered in ED Medications - No data to display  ED Course  I have reviewed the triage vital signs and the nursing notes.  Pertinent labs & imaging results that were available during my care of the patient were reviewed by me and considered in my medical decision making (see chart for  details).    MDM Rules/Calculators/A&P                          No evidence of cerebellar stroke or unsteady gait on exam.  Work-up is otherwise unremarkable labs normal EKG unremarkable.  We will recommend outpatient follow-up with his doctor in 3 to 4 days.  Recommend immediate return for worsening symptoms fevers pain weakness numbness or any additional concerns.  Final Clinical Impression(s) / ED Diagnoses Final diagnoses:  Dizziness    Rx / DC Orders ED Discharge Orders    None       Luna Fuse, MD 01/16/21 [phone]

## 2021-01-16 NOTE — ED Triage Notes (Signed)
Pt presents with c/o dizziness for several days, no hx of vertigo. Pt reports he has never had this issue before.

## 2021-02-06 ENCOUNTER — Other Ambulatory Visit: Payer: Self-pay | Admitting: Family Medicine

## 2021-02-06 DIAGNOSIS — E1140 Type 2 diabetes mellitus with diabetic neuropathy, unspecified: Secondary | ICD-10-CM

## 2021-03-30 ENCOUNTER — Other Ambulatory Visit: Payer: Self-pay

## 2021-03-30 ENCOUNTER — Other Ambulatory Visit: Payer: Self-pay | Admitting: Family Medicine

## 2021-03-30 DIAGNOSIS — F419 Anxiety disorder, unspecified: Secondary | ICD-10-CM

## 2021-03-30 MED ORDER — HYDROXYZINE HCL 10 MG PO TABS
ORAL_TABLET | Freq: Three times a day (TID) | ORAL | 0 refills | Status: DC | PRN
  Filled 2021-03-30 (×3): qty 30, 10d supply, fill #0

## 2021-03-31 ENCOUNTER — Other Ambulatory Visit: Payer: Self-pay

## 2021-04-01 ENCOUNTER — Other Ambulatory Visit: Payer: Self-pay

## 2021-04-08 ENCOUNTER — Other Ambulatory Visit: Payer: Self-pay

## 2021-04-08 ENCOUNTER — Encounter (HOSPITAL_COMMUNITY): Payer: Self-pay

## 2021-04-08 ENCOUNTER — Ambulatory Visit (HOSPITAL_COMMUNITY)
Admission: EM | Admit: 2021-04-08 | Discharge: 2021-04-08 | Disposition: A | Discharge status: Home / Self Care | Payer: Medicare Other | Attending: Physician Assistant | Admitting: Physician Assistant

## 2021-04-08 DIAGNOSIS — S0502XA Injury of conjunctiva and corneal abrasion without foreign body, left eye, initial encounter: Secondary | ICD-10-CM

## 2021-04-08 DIAGNOSIS — H5789 Other specified disorders of eye and adnexa: Secondary | ICD-10-CM | POA: Diagnosis not present

## 2021-04-08 MED ORDER — TETRACAINE HCL 0.5 % OP SOLN
OPHTHALMIC | Status: AC
  Filled 2021-04-08: qty 4

## 2021-04-08 MED ORDER — ERYTHROMYCIN 5 MG/GM OP OINT
TOPICAL_OINTMENT | OPHTHALMIC | 0 refills | Status: DC
  Filled 2021-04-08: qty 3.5, 10d supply, fill #0
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
## 2021-04-08 NOTE — Discharge Instructions (Addendum)
Use lubricating eyedrops to help with symptoms.  Apply erythromycin ointment twice daily.  Do not touch tip of medication bottle to your eye and wash hands prior to applying medication to prevent contamination.  Do not wear any contact lenses.  If symptoms do not improve within 24 hours please follow-up with ophthalmology.  If anything worsens suddenly please go to the ER.

## 2021-04-08 NOTE — ED Triage Notes (Signed)
Pt reports redness, clear drainage and sensation of foreign body in the left eye since this morning. Denies pain.

## 2021-04-10 ENCOUNTER — Other Ambulatory Visit: Payer: Self-pay

## 2021-04-13 ENCOUNTER — Other Ambulatory Visit: Payer: Self-pay

## 2021-04-13 ENCOUNTER — Encounter (HOSPITAL_COMMUNITY): Payer: Self-pay

## 2021-04-13 ENCOUNTER — Ambulatory Visit (HOSPITAL_COMMUNITY)
Admission: EM | Admit: 2021-04-13 | Discharge: 2021-04-13 | Disposition: A | Discharge status: Home / Self Care | Payer: Medicare Other | Attending: Emergency Medicine | Admitting: Emergency Medicine

## 2021-04-13 DIAGNOSIS — K0889 Other specified disorders of teeth and supporting structures: Secondary | ICD-10-CM

## 2021-04-13 MED ORDER — AMOXICILLIN 500 MG PO CAPS: 1000.0000 mg | ORAL_CAPSULE | Freq: Two times a day (BID) | ORAL | 0 refills | Status: AC

## 2021-04-13 NOTE — Discharge Instructions (Signed)
OPTIONS FOR DENTAL FOLLOW UP CARE ° °[place] Department of Health and Human Services - Local Safety Net Dental Clinics °http://www.ncdhhs.gov/dph/oralhealth/services/safetynetclinics.htm °  °[facility] (336-562-3123) ° °[facility] (919-933-9087) ° °[facility] ([phone] ext [phone]) ° °[facility] ([phone]) ° °[facility] ([phone]) °This clinic caters to the indigent population and is on a lottery system. °Location: °[facility], [address], [place] °Clinic Hours: °Wednesdays from 6pm - 9pm, patients seen by a lottery system. °For dates, call or go to [email or web address].[email or web address]/[facility]/patients/[email or web address] °Services: °Cleanings, fillings and simple extractions. °Payment Options: °DENTAL WORK IS FREE OF CHARGE. Bring proof of income or support. °Best way to get seen: °Arrive at 5:15 pm - this is a lottery, NOT first come/first serve, so arriving earlier will not increase your chances of being seen. °  °  °[facility] °[phone] °Select option 1 for emergencies °  °Location: °[facility], [address], [place] °Clinic Hours: °No walk-ins accepted - call the day before to schedule an appointment. °Check in times are 9:30 am and 1:30 pm. °Services: °Simple extractions, temporary fillings, pulpectomy/pulp debridement, uncomplicated abscess drainage. °Payment Options: °PAYMENT IS DUE AT THE TIME OF SERVICE.  Fee is usually $100-200, additional surgical procedures (e.g. abscess drainage) may be extra. °Cash, checks, Visa/MasterCard accepted.  Can file Medicaid if patient is covered for dental - patient should call case worker to check. °No discount for [facility] patients. °Best way to get seen: °MUST call the day before and get onto the schedule. Can usually be seen the next 1-2 days. No walk-ins accepted. °  °  °[facility] °[phone] °   °Location: °[facility], [address], [place] °Clinic Hours: °M, W, Th, F 8am or 1:30pm, Tues 9a or 1:30 - first come/first served. °Services: °Simple extractions, temporary fillings, uncomplicated abscess drainage.  You do not need to be an [place] resident. °Payment Options: °PAYMENT IS DUE AT THE TIME OF SERVICE. °Dental insurance, otherwise sliding scale - bring proof of income or support. °Depending on income and treatment needed, cost is usually $50-200. °Best way to get seen: °Arrive early as it is first come/first served. °  °  °[facility] °[phone] °  °Location: °[address] °Clinic Hours: °Mon-Thu 8a-5p °Services: °Most basic dental services including extractions and fillings. °Payment Options: °PAYMENT IS DUE AT THE TIME OF SERVICE. °Sliding scale, up to 50% off - bring proof if income or support. °Medicaid with dental option accepted. °Best way to get seen: °Call to schedule an appointment, can usually be seen within 2 weeks OR they will try to see walk-ins - show up at 8a or 2p (you may have to wait). °  °  °[facility] °919-245-2435 °[place] RESIDENTS ONLY °  °Location: °[facility], [address], [place], [place] [address] °Clinic Hours: By appointment only. °Monday - Thursday 8am-5pm, Friday 8am-12pm °Services: Cleanings, fillings, extractions. °Payment Options: °PAYMENT IS DUE AT THE TIME OF SERVICE. °Cash, Visa or MasterCard. Sliding scale - $30 minimum per service. °Best way to get seen: °Come in to office, complete packet and make an appointment - need proof of income °or support monies for each household member and proof of [place] residence. °Usually takes about a month to get in. °  °  °[facility] °[phone] °  °Location: °[address]   [place] °Clinic Hours: Walk-in Urgent Care Dental Services are offered Monday-Friday  mornings only. °The numbers of emergencies accepted daily is limited to the number of °providers available. °Maximum 15 - Mondays, Wednesdays & Thursdays °Maximum 10 - Tuesdays & Fridays °Services: °You do not need to be a [place] resident to be seen for a dental emergency. °Emergencies are defined as pain, swelling, abnormal bleeding, or dental trauma. Walkins will receive x-rays if needed. °NOTE: Dental cleaning is not an emergency. °Payment Options: °PAYMENT IS DUE AT THE TIME OF SERVICE. °Minimum co-pay is $40.00 for uninsured patients. °Minimum co-pay is $3.00 for Medicaid with dental coverage. °Dental Insurance is accepted and must be presented at time of visit. °Medicare does not cover dental. °Forms of payment: Cash, credit card, checks. °Best way to get seen: °If not previously registered with the clinic, walk-in dental registration begins at 7:15 am and is on a first come/first serve basis. °If previously registered with the clinic, call to make an appointment. °  °  °[facility] °919-776-4359 °[place] RESIDENTS ONLY °  °Location: °[address], [place], [place] °Clinic Hours: °Mon-Thu 10a-2p °Services: Extractions only! °Payment Options: °FREE (donations accepted) - bring proof of income or support °Best way to get seen: °Call and schedule an appointment OR come at 8am on the 1st Monday of every month (except for holidays) when it is first come/first served. °  °  °[facility] °[phone] °  °Location: °[address], [place] °Clinic Hours: °Friday mornings °Services, Payment Options, Best way to get seen: °Call for info °

## 2021-04-13 NOTE — ED Triage Notes (Signed)
Pt presents with left side dental abscess he noticed this morning.

## 2021-04-13 NOTE — ED Provider Notes (Signed)
[place]  ____________________________________________  Time seen: Approximately 4:17 PM  I have reviewed the triage vital signs and the nursing notes.   HISTORY  Chief Complaint Dental Pain   Historian Patient     HPI Jared [name] is a 55 y.o. male presents to the urgent care with left upper jaw swelling that patient noticed this morning.  Patient has several broken teeth along the left upper jaw and patient has not made an appointment with a local dentist.  No difficulty swallowing or pain underneath the tongue.  Patient is able to manage his own secretions.  No other alleviating measures have been attempted.   Past Medical History:  Diagnosis Date  . Crohn disease ([place]) 2008   with hospital admission in 2011, and 8/12 for flare up and SBO relieved with bowel rest. no suregery, no meds for CD  . Diabetes mellitus type II   . Hypertension      Immunizations up to date:  Yes.     Past Medical History:  Diagnosis Date  . Crohn disease ([place]) 2008   with hospital admission in 2011, and 8/12 for flare up and SBO relieved with bowel rest. no suregery, no meds for CD  . Diabetes mellitus type II   . Hypertension     Patient Active Problem List   Diagnosis Date Noted  . Ileus ([place]) 02/21/2020  . Acute Crohn's disease with intestinal obstruction ([place]) 02/20/2020  . SBO (small bowel obstruction) ([place])   . Chest pain 11/05/2018  . HTN (hypertension) 11/05/2018  . Cervical spine fracture ([place]) 07/28/2017  . C2 cervical fracture ([place]) 07/28/2017  . Dyslipidemia 09/06/2014  . Left facial swelling 09/06/2014  . Dental caries 09/06/2014  . Crohn's disease ([place]) 06/30/2014  . Tobacco use disorder 06/30/2014  . Diabetes mellitus type II, non insulin dependent ([employer]) 06/30/2014  . Crohn disease ([place]) 05/10/2014  . Intra-abdominal abscess ([place]) 02/13/2012  . S/P exploratory laparotomy with ileocecectomy 02/13/2012  . Hypokalemia 02/13/2012  . Hypomagnesemia  02/13/2012  . CD (Crohn's disease) ([place]) 01/20/2012  . DM (diabetes mellitus) ([place]) 01/20/2012    Past Surgical History:  Procedure Laterality Date  . BOWEL RESECTION  01/25/2012   Procedure: SMALL BOWEL RESECTION;  Surgeon: [name], MD;  Location: [facility];  Service: General;  Laterality: N/A;  . 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 LAPAROTOMY  01/25/2012   Procedure: EXPLORATORY LAPAROTOMY;  Surgeon: [name], MD;  Location: [facility];  Service: General;  Laterality: N/A;  . SCROTAL SURGERY  ~ 27   "took out a pellet"    Prior to Admission medications   Medication Sig Start Date End Date Taking? Authorizing Provider  amoxicillin (AMOXIL) 500 MG capsule Take 2 capsules (1,000 mg total) by mouth 2 (two) times daily for 10 days. 04/13/21 04/23/21 Yes Vallarie Mare M, PA-C  Adalimumab (HUMIRA PEN) 40 MG/0.4ML PNKT Inject 40 mg into the skin every 14 (fourteen) days. 09/16/20   [place] Park, MD  aspirin-sod bicarb-citric acid (ALKA-SELTZER) 325 MG TBEF tablet Take 325 mg by mouth every 6 (six) hours as needed (congestion).    [provider]  atorvastatin (LIPITOR) 10 MG tablet Take 1 tablet (10 mg total) by mouth daily. 06/03/20   Dorena Dew, FNP  Blood Glucose Monitoring Suppl (TRUE METRIX METER) w/Device KIT 1 each by Does not apply route 4 (four) times daily -  before meals and at bedtime. 06/21/19   Lanae Boast,  FNP  budesonide (ENTOCORT EC) 3 MG 24 hr capsule Take 3 capsules (9 mg total) by mouth daily. 06/04/20   Thornton Park, MD  erythromycin ophthalmic ointment Place a 1/2 inch ribbon of ointment into the lower eyelid twice a day. 04/08/21   Raspet, Derry Skill, PA-C  glucose blood (TRUE METRIX BLOOD GLUCOSE TEST) test strip Use as instructed 07/06/17   Dorena Dew, FNP  hydrOXYzine (ATARAX/VISTARIL) 10 MG tablet TAKE 1 TABLET (10 MG TOTAL) BY MOUTH 3 (THREE) TIMES DAILY AS NEEDED. 03/30/21 03/30/22  Dorena Dew, FNP  lisinopril  (ZESTRIL) 20 MG tablet Take 1 tablet (20 mg total) by mouth daily. 06/03/20 06/03/21  Dorena Dew, FNP  metFORMIN (GLUCOPHAGE) 1000 MG tablet TAKE 1 TABLET BY MOUTH 2 TIMES DAILY WITH A MEAL. Patient taking differently: Take 1,000 mg by mouth 2 (two) times daily with a meal. 03/26/20   Dorena Dew, FNP  omega-3 acid ethyl esters (LOVAZA) 1 g capsule Take 2 g by mouth 2 (two) times daily. 01/15/21   [provider]  TRADJENTA 5 MG TABS tablet TAKE 1 TABLET BY MOUTH DAILY 02/06/21   Dorena Dew, FNP  furosemide (LASIX) 10 MG/ML solution Take by mouth daily.  01/20/12  [provider]  mesalamine (PENTASA) 250 MG CR capsule Take 1,000 mg by mouth 4 (four) times daily.  01/20/12  [provider]  ONGLYZA 5 MG TABS tablet TAKE 1 TABLET (5 MG TOTAL) BY MOUTH DAILY. Patient not taking: Reported on 06/03/2020 06/02/20 06/03/20  Dorena Dew, FNP  pioglitazone (ACTOS) 15 MG tablet Take by mouth daily.  01/20/12  [provider]    Allergies Trazodone and nefazodone  Family History  Problem Relation Age of Onset  . Cancer Mother        lung  . Diabetes Mother   . Alopecia Mother   . Coronary artery disease Mother   . Diabetes Sister   . Hyperlipidemia Sister   . Hypertension Sister   . Angina Brother   . Hepatitis Brother   . Alcohol abuse Brother   . Diabetes Brother   . Colon cancer Paternal Grandmother   . Esophageal cancer Neg Hx   . Stomach cancer Neg Hx   . Rectal cancer Neg Hx     Social History Social History   Tobacco Use  . Smoking status: Current Every Day Smoker    Packs/day: 0.25    Years: 30.00    Pack years: 7.50    Types: Cigarettes  . Smokeless tobacco: Former Systems developer    Types: [place] date: 01/20/1997  Vaping Use  . Vaping Use: Never used  Substance Use Topics  . Alcohol use: Not Currently  . Drug use: No     Review of Systems  Constitutional: No fever/chills Eyes:  No discharge ENT: Patient has left upper  jaw swelling.  Respiratory: no cough. No SOB/ use of accessory muscles to breath Gastrointestinal:   No nausea, no vomiting.  No diarrhea.  No constipation. Musculoskeletal: Negative for musculoskeletal pain. Skin: Negative for rash, abrasions, lacerations, ecchymosis.   ____________________________________________   PHYSICAL EXAM:  VITAL SIGNS: ED Triage Vitals  Enc Vitals Group     BP 04/13/21 1500 (!) 149/68     Pulse Rate 04/13/21 1500 89     Resp 04/13/21 1500 17     Temp 04/13/21 1500 99 F (37.2 C)     Temp Source 04/13/21 1500 Oral  SpO2 04/13/21 1500 100 %     Weight --      Height --      Head Circumference --      Peak Flow --      Pain Score 04/13/21 1501 7     Pain Loc --      Pain Edu? --      Excl. in [place]? --      Constitutional: Alert and oriented. Well appearing and in no acute distress. Eyes: Conjunctivae are normal. PERRL. EOMI. Head: Atraumatic. ENT:      Ears:       Nose: No congestion/rhinnorhea.      Mouth/Throat: Mucous membranes are moist.  She had has left upper jaw swelling. Neck: No stridor.  No cervical spine tenderness to palpation. Cardiovascular: Normal rate, regular rhythm. Normal S1 and S2.  Good peripheral circulation. Respiratory: Normal respiratory effort without tachypnea or retractions. Lungs CTAB. Good air entry to the bases with no decreased or absent breath sounds Gastrointestinal: Bowel sounds x 4 quadrants. Soft and nontender to palpation. No guarding or rigidity. No distention. Musculoskeletal: Full range of motion to all extremities. No obvious deformities noted Neurologic:  Normal for age. No gross focal neurologic deficits are appreciated.  Skin:  Skin is warm, dry and intact. No rash noted. Psychiatric: Mood and affect are normal for age. Speech and behavior are normal.   ____________________________________________   LABS (all labs ordered are listed, but only abnormal results are displayed)  Labs Reviewed - No  data to display ____________________________________________  EKG   ____________________________________________  RADIOLOGY   No results found.  ____________________________________________    PROCEDURES  Procedure(s) performed:     Procedures     Medications - No data to display   ____________________________________________   INITIAL IMPRESSION / ASSESSMENT AND PLAN / ED COURSE  Pertinent labs & imaging results that were available during my care of the patient were reviewed by me and considered in my medical decision making (see chart for details).      Assessment and plan Dental pain 55 year old male presents to the urgent care after he developed left upper jaw swelling and dental pain.  Patient has several broken teeth along the left upper jaw.  Patient was started on amoxicillin and dental resources were provided in patient's discharge paperwork.  He was advised to make an appointment with a local dentist as soon as possible.  All patient questions were answered.     ____________________________________________  FINAL CLINICAL IMPRESSION(S) / ED DIAGNOSES  Final diagnoses:  Pain, dental      NEW MEDICATIONS STARTED DURING THIS VISIT:  ED Discharge Orders         Ordered    amoxicillin (AMOXIL) 500 MG capsule  2 times daily        04/13/21 1526              This chart was dictated using voice recognition software/Dragon. Despite best efforts to proofread, errors can occur which can change the meaning. Any change was purely unintentional.     Lannie Fields, PA-C 04/13/21 1626

## 2021-05-13 ENCOUNTER — Ambulatory Visit (HOSPITAL_COMMUNITY)
Admission: EM | Admit: 2021-05-13 | Discharge: 2021-05-13 | Disposition: A | Discharge status: Home / Self Care | Payer: Medicare Other | Attending: Urgent Care | Admitting: Urgent Care

## 2021-05-13 ENCOUNTER — Other Ambulatory Visit: Payer: Self-pay

## 2021-05-13 ENCOUNTER — Encounter (HOSPITAL_COMMUNITY): Payer: Self-pay

## 2021-05-13 DIAGNOSIS — K5090 Crohn's disease, unspecified, without complications: Secondary | ICD-10-CM

## 2021-05-13 DIAGNOSIS — K047 Periapical abscess without sinus: Secondary | ICD-10-CM | POA: Diagnosis not present

## 2021-05-13 DIAGNOSIS — K1379 Other lesions of oral mucosa: Secondary | ICD-10-CM

## 2021-05-13 MED ORDER — IBUPROFEN 600 MG PO TABS: 600.0000 mg | ORAL_TABLET | Freq: Four times a day (QID) | ORAL | 0 refills | Status: DC | PRN

## 2021-05-13 MED ORDER — AMOXICILLIN-POT CLAVULANATE 875-125 MG PO TABS: 1.0000 | ORAL_TABLET | Freq: Two times a day (BID) | ORAL | 0 refills | Status: DC

## 2021-05-13 NOTE — ED Provider Notes (Addendum)
[place]   MRN: [id number] DOB: [birth year]  Subjective:   Jared [name] is a 55 y.o. male presenting for 1 day history of recurrent gum/dental pain. Has used ibuprofen and was planning on going to a dentist but knows he needs an antibiotic.  Denies history of heart disease, kidney disease.  He does have a history of Crohn's disease.  He has previously used amoxicillin without any issues and same with ibuprofen.  Denies fever, chest pain, shortness of breath, nausea, vomiting, abdominal pain.  He also has uncontrolled diabetes.  No current facility-administered medications for this encounter.  Current Outpatient Medications:    Adalimumab (HUMIRA PEN) 40 MG/0.4ML PNKT, Inject 40 mg into the skin every 14 (fourteen) days., Disp: 2 each, Rfl: 11   aspirin-sod bicarb-citric acid (ALKA-SELTZER) 325 MG TBEF tablet, Take 325 mg by mouth every 6 (six) hours as needed (congestion)., Disp: , Rfl:    atorvastatin (LIPITOR) 10 MG tablet, Take 1 tablet (10 mg total) by mouth daily., Disp: 90 tablet, Rfl: 3   Blood Glucose Monitoring Suppl (TRUE METRIX METER) w/Device KIT, 1 each by Does not apply route 4 (four) times daily -  before meals and at bedtime., Disp: 1 kit, Rfl: 0   budesonide (ENTOCORT EC) 3 MG 24 hr capsule, Take 3 capsules (9 mg total) by mouth daily., Disp: 90 capsule, Rfl: 6   erythromycin ophthalmic ointment, Place a 1/2 inch ribbon of ointment into the lower eyelid twice a day., Disp: 3.5 g, Rfl: 0   glucose blood (TRUE METRIX BLOOD GLUCOSE TEST) test strip, Use as instructed, Disp: 100 each, Rfl: 12   hydrOXYzine (ATARAX/VISTARIL) 10 MG tablet, TAKE 1 TABLET (10 MG TOTAL) BY MOUTH 3 (THREE) TIMES DAILY AS NEEDED., Disp: 30 tablet, Rfl: 0   lisinopril (ZESTRIL) 20 MG tablet, Take 1 tablet (20 mg total) by mouth daily., Disp: 90 tablet, Rfl: 3   metFORMIN (GLUCOPHAGE) 1000 MG tablet, TAKE 1 TABLET BY MOUTH 2 TIMES DAILY WITH A MEAL. (Patient taking differently: Take  1,000 mg by mouth 2 (two) times daily with a meal.), Disp: 180 tablet, Rfl: 2   omega-3 acid ethyl esters (LOVAZA) 1 g capsule, Take 2 g by mouth 2 (two) times daily., Disp: , Rfl:    TRADJENTA 5 MG TABS tablet, TAKE 1 TABLET BY MOUTH DAILY, Disp: 30 tablet, Rfl: 10   Allergies  Allergen Reactions   Trazodone And Nefazodone     hallucinations    Past Medical History:  Diagnosis Date   Crohn disease ([place]) 2008   with hospital admission in 2011, and 8/12 for flare up and SBO relieved with bowel rest. no suregery, no meds for CD   Diabetes mellitus type II    Hypertension      Past Surgical History:  Procedure Laterality Date   BOWEL RESECTION  01/25/2012   Procedure: SMALL BOWEL RESECTION;  Surgeon: [name], MD;  Location: [facility];  Service: General;  Laterality: N/A;   FINGER AMPUTATION  ~ 2000   partial; right" pointer"   LAPAROTOMY  01/25/2012   Procedure: EXPLORATORY LAPAROTOMY;  Surgeon: [name], MD;  Location: [facility];  Service: General;  Laterality: N/A;   SCROTAL SURGERY  ~ 78   "took out a pellet"    Family History  Problem Relation Age of Onset   Cancer Mother        lung   Diabetes Mother    Alopecia Mother    Coronary  artery disease Mother    Diabetes Sister    Hyperlipidemia Sister    Hypertension Sister    Angina Brother    Hepatitis Brother    Alcohol abuse Brother    Diabetes Brother    Colon cancer Paternal Grandmother    Esophageal cancer Neg Hx    Stomach cancer Neg Hx    Rectal cancer Neg Hx     Social History   Tobacco Use   Smoking status: Every Day    Packs/day: 0.25    Years: 30.00    Pack years: 7.50    Types: Cigarettes   Smokeless tobacco: Former    Types: Chew    Quit date: 01/20/1997  Vaping Use   Vaping Use: Never used  Substance Use Topics   Alcohol use: Not Currently   Drug use: No    ROS   Objective:   Vitals: BP (!) 147/65 (BP Location: Right Arm)   Pulse 77   Temp 98.7 F (37.1 C) (Oral)    Resp 18   SpO2 100%   Physical Exam Constitutional:      General: He is not in acute distress.    Appearance: Normal appearance. He is well-developed and normal weight. He is not ill-appearing, toxic-appearing or diaphoretic.  HENT:     Head: Normocephalic and atraumatic.     Right Ear: External ear normal.     Left Ear: External ear normal.     Nose: Nose normal.     Mouth/Throat:     Pharynx: Oropharynx is clear.      Comments: Poor dentition with multiple missing teeth, molars.  The 2 incisors outlined indicate significant gingival recession with associated swelling, white discharge.  Area is exquisitely tender. Eyes:     General: No scleral icterus.       Right eye: No discharge.        Left eye: No discharge.     Extraocular Movements: Extraocular movements intact.     Pupils: Pupils are equal, round, and reactive to light.  Cardiovascular:     Rate and Rhythm: Normal rate.  Pulmonary:     Effort: Pulmonary effort is normal.  Musculoskeletal:     Cervical back: Normal range of motion.  Neurological:     Mental Status: He is alert and oriented to person, place, and time.  Psychiatric:        Mood and Affect: Mood normal.        Behavior: Behavior normal.        Thought Content: Thought content normal.        Judgment: Judgment normal.      Assessment and Plan :   PDMP not reviewed this encounter.  1. Dental infection   2. Oral pain   3. Crohn's disease without complication, unspecified gastrointestinal tract location ([place])     Start Augmentin for dental infection/abscess, use ibuprofen for pain and inflammation. Emphasized need for dental surgeon consult. Counseled patient on potential for adverse effects with medications prescribed/recommended today, strict ER and return-to-clinic precautions discussed, patient verbalized understanding.     Jaynee Eagles, PA-C 05/13/21 1304

## 2021-05-13 NOTE — Discharge Instructions (Addendum)
Make sure you schedule an appointment with a dentist/dental surgeon as soon as possible.  You may try some of the resources below.    [facility] [phone] extension [phone]  Dr. Donn Pierini [phone] [place] [phone] [address] [facility] [place].  [facility] [phone] extension [phone] [place], [place], [address] First come first serve for the first 10 clients.  May do simple extractions only, no wisdom teeth or surgery.  You may try the second for Thursday of the month starting at [place] You may call the school to see if they are still helping to provide dental care for emergent cases.

## 2021-05-13 NOTE — ED Triage Notes (Signed)
Pt reports abscess sin the gum x 1 day; dental pain since this morning. Ibuprofen gives no relief.

## 2021-05-26 ENCOUNTER — Other Ambulatory Visit: Payer: Self-pay | Admitting: Family Medicine

## 2021-05-26 ENCOUNTER — Other Ambulatory Visit: Payer: Self-pay

## 2021-05-26 DIAGNOSIS — F419 Anxiety disorder, unspecified: Secondary | ICD-10-CM

## 2021-05-26 MED ORDER — HYDROXYZINE HCL 10 MG PO TABS
ORAL_TABLET | Freq: Three times a day (TID) | ORAL | 0 refills | Status: DC | PRN
  Filled 2021-05-26: qty 30, 10d supply, fill #0

## 2021-05-27 ENCOUNTER — Other Ambulatory Visit: Payer: Self-pay

## 2021-06-01 ENCOUNTER — Other Ambulatory Visit: Payer: Self-pay | Admitting: Gastroenterology

## 2021-06-01 DIAGNOSIS — K50919 Crohn's disease, unspecified, with unspecified complications: Secondary | ICD-10-CM

## 2021-06-01 DIAGNOSIS — R1084 Generalized abdominal pain: Secondary | ICD-10-CM

## 2021-06-01 DIAGNOSIS — D509 Iron deficiency anemia, unspecified: Secondary | ICD-10-CM

## 2021-06-01 DIAGNOSIS — R140 Abdominal distension (gaseous): Secondary | ICD-10-CM

## 2021-06-21 ENCOUNTER — Encounter (HOSPITAL_COMMUNITY): Payer: Self-pay | Admitting: Emergency Medicine

## 2021-06-21 ENCOUNTER — Other Ambulatory Visit: Payer: Self-pay

## 2021-06-21 ENCOUNTER — Emergency Department (HOSPITAL_COMMUNITY)
Admission: EM | Admit: 2021-06-21 | Discharge: 2021-06-22 | Disposition: A | Discharge status: Home / Self Care | Payer: 59 | Attending: Emergency Medicine | Admitting: Emergency Medicine

## 2021-06-21 DIAGNOSIS — E1169 Type 2 diabetes mellitus with other specified complication: Secondary | ICD-10-CM | POA: Diagnosis not present

## 2021-06-21 DIAGNOSIS — K0889 Other specified disorders of teeth and supporting structures: Secondary | ICD-10-CM | POA: Diagnosis present

## 2021-06-21 DIAGNOSIS — G8918 Other acute postprocedural pain: Secondary | ICD-10-CM | POA: Insufficient documentation

## 2021-06-21 DIAGNOSIS — F17210 Nicotine dependence, cigarettes, uncomplicated: Secondary | ICD-10-CM | POA: Insufficient documentation

## 2021-06-21 DIAGNOSIS — Z7984 Long term (current) use of oral hypoglycemic drugs: Secondary | ICD-10-CM | POA: Diagnosis not present

## 2021-06-21 DIAGNOSIS — E785 Hyperlipidemia, unspecified: Secondary | ICD-10-CM | POA: Diagnosis not present

## 2021-06-21 DIAGNOSIS — I10 Essential (primary) hypertension: Secondary | ICD-10-CM | POA: Diagnosis not present

## 2021-06-21 DIAGNOSIS — Z79899 Other long term (current) drug therapy: Secondary | ICD-10-CM | POA: Diagnosis not present

## 2021-06-21 NOTE — ED Notes (Signed)
Pt ambulatory in ED lobby.

## 2021-06-21 NOTE — ED Triage Notes (Addendum)
Pt had 7 teeth pulled on 8/1. On 8/2, pt noticed an "infection taste" in mouth and left sided jaw/neck pain. Pt concerned for infection. Pt took ibuprofen PTA with some relief.

## 2021-06-22 MED ORDER — CHLORHEXIDINE GLUCONATE 0.12 % MT SOLN: 15.0000 mL | Freq: Two times a day (BID) | OROMUCOSAL | 0 refills | Status: DC

## 2021-06-22 NOTE — ED Provider Notes (Signed)
[facility] DEPT Provider Note  CSN: [id number] Arrival date & time: 06/21/21 2029  Chief Complaint(s) Dental Pain and Post-op Problem  HPI Jared [name] is a 55 y.o. male with a past medical history listed below who presents to the emergency department for postop discomfort after dental extraction.  Reports that this was done 1 week ago.  Reports pain for the past 5 days he has been tasting what seems to be pus.  He has tried calling the dentist office with no response.  Denies any fevers or chills.  Endorses discomfort to the regions of the extraction.  No bleeding.  No facial swelling.  No difficulty swallowing.  No trouble breathing.  HPI  Past Medical History Past Medical History:  Diagnosis Date   Crohn disease ([place]) 2008   with hospital admission in 2011, and 8/12 for flare up and SBO relieved with bowel rest. no suregery, no meds for CD   Diabetes mellitus type II    Hypertension    Patient Active Problem List   Diagnosis Date Noted   Ileus ([place]) 02/21/2020   Acute Crohn's disease with intestinal obstruction ([place]) 02/20/2020   SBO (small bowel obstruction) ([place])    Chest pain 11/05/2018   HTN (hypertension) 11/05/2018   Cervical spine fracture ([place]) 07/28/2017   C2 cervical fracture ([place]) 07/28/2017   Dyslipidemia 09/06/2014   Left facial swelling 09/06/2014   Dental caries 09/06/2014   Crohn's disease ([place]) 06/30/2014   Tobacco use disorder 06/30/2014   Diabetes mellitus type II, non insulin dependent ([place]) 06/30/2014   Crohn disease ([place]) 05/10/2014   Intra-abdominal abscess ([place]) 02/13/2012   S/P exploratory laparotomy with ileocecectomy 02/13/2012   Hypokalemia 02/13/2012   Hypomagnesemia 02/13/2012   CD (Crohn's disease) ([place]) 01/20/2012   DM (diabetes mellitus) ([place]) 01/20/2012   Home Medication(s) Prior to Admission medications   Medication Sig Start Date End Date Taking? Authorizing Provider  chlorhexidine (PERIDEX) 0.12 %  solution Use as directed 15 mLs in the mouth or throat 2 (two) times daily. 06/22/21  Yes Aryaman Haliburton, Grayce Sessions, MD  Adalimumab (HUMIRA PEN) 40 MG/0.4ML PNKT Inject 40 mg into the skin every 14 (fourteen) days. 09/16/20   Thornton Park, MD  amoxicillin-clavulanate (AUGMENTIN) 875-125 MG tablet Take 1 tablet by mouth every 12 (twelve) hours. 05/13/21   Jaynee Eagles, PA-C  aspirin-sod bicarb-citric acid (ALKA-SELTZER) 325 MG TBEF tablet Take 325 mg by mouth every 6 (six) hours as needed (congestion).    [provider]  atorvastatin (LIPITOR) 10 MG tablet Take 1 tablet (10 mg total) by mouth daily. 06/03/20   Dorena Dew, FNP  Blood Glucose Monitoring Suppl (TRUE METRIX METER) w/Device KIT 1 each by Does not apply route 4 (four) times daily -  before meals and at bedtime. 06/21/19   Lanae Boast, FNP  budesonide (ENTOCORT EC) 3 MG 24 hr capsule Take 3 capsules (9 mg total) by mouth daily. 06/04/20   Thornton Park, MD  erythromycin ophthalmic ointment Place a 1/2 inch ribbon of ointment into the lower eyelid twice a day. 04/08/21   Raspet, Derry Skill, PA-C  glucose blood (TRUE METRIX BLOOD GLUCOSE TEST) test strip Use as instructed 07/06/17   Dorena Dew, FNP  hydrOXYzine (ATARAX/VISTARIL) 10 MG tablet TAKE 1 TABLET (10 MG TOTAL) BY MOUTH 3 (THREE) TIMES DAILY AS NEEDED. 05/26/21 05/26/22  Dorena Dew, FNP  ibuprofen (ADVIL) 600 MG tablet Take 1 tablet (600 mg total) by mouth every 6 (six) hours as needed.  05/13/21   Jaynee Eagles, PA-C  lisinopril (ZESTRIL) 20 MG tablet Take 1 tablet (20 mg total) by mouth daily. 06/03/20 06/03/21  Dorena Dew, FNP  metFORMIN (GLUCOPHAGE) 1000 MG tablet TAKE 1 TABLET BY MOUTH 2 TIMES DAILY WITH A MEAL. Patient taking differently: Take 1,000 mg by mouth 2 (two) times daily with a meal. 03/26/20   Dorena Dew, FNP  omega-3 acid ethyl esters (LOVAZA) 1 g capsule Take 2 g by mouth 2 (two) times daily. 01/15/21   [provider]  TRADJENTA 5  MG TABS tablet TAKE 1 TABLET BY MOUTH DAILY 02/06/21   Dorena Dew, FNP  furosemide (LASIX) 10 MG/ML solution Take by mouth daily.  01/20/12  [provider]  mesalamine (PENTASA) 250 MG CR capsule Take 1,000 mg by mouth 4 (four) times daily.  01/20/12  [provider]  ONGLYZA 5 MG TABS tablet TAKE 1 TABLET (5 MG TOTAL) BY MOUTH DAILY. Patient not taking: Reported on 06/03/2020 06/02/20 06/03/20  Dorena Dew, FNP  pioglitazone (ACTOS) 15 MG tablet Take by mouth daily.  01/20/12  [provider]                                                                                                                                    Past Surgical History Past Surgical History:  Procedure Laterality Date   BOWEL RESECTION  01/25/2012   Procedure: SMALL BOWEL RESECTION;  Surgeon: [name], MD;  Location: [facility];  Service: General;  Laterality: N/A;   FINGER AMPUTATION  ~ 2000   partial; right" pointer"   LAPAROTOMY  01/25/2012   Procedure: EXPLORATORY LAPAROTOMY;  Surgeon: [name], MD;  Location: [facility];  Service: General;  Laterality: N/A;   SCROTAL SURGERY  ~ 24   "took out a pellet"   Family History Family History  Problem Relation Age of Onset   Cancer Mother        lung   Diabetes Mother    Alopecia Mother    Coronary artery disease Mother    Diabetes Sister    Hyperlipidemia Sister    Hypertension Sister    Angina Brother    Hepatitis Brother    Alcohol abuse Brother    Diabetes Brother    Colon cancer Paternal Grandmother    Esophageal cancer Neg Hx    Stomach cancer Neg Hx    Rectal cancer Neg Hx     Social History Social History   Tobacco Use   Smoking status: Every Day    Packs/day: 0.25    Years: 30.00    Pack years: 7.50    Types: Cigarettes   Smokeless tobacco: Former    Types: Chew    Quit date: 01/20/1997  Vaping Use   Vaping Use: Never used  Substance Use Topics   Alcohol use: Not Currently   Drug use: No    Allergies  Trazodone and nefazodone  Review of Systems Review of Systems All other systems are reviewed and are negative for acute change except as noted in the HPI  Physical Exam Vital Signs  I have reviewed the triage vital signs BP (!) 166/85   Pulse (!) 56   Temp 98.7 F (37.1 C) (Oral)   Resp 18   Ht 6' 2"  (1.88 m)   Wt 92.5 kg   SpO2 100%   BMI 26.19 kg/m   Physical Exam Vitals reviewed.  Constitutional:      General: He is not in acute distress.    Appearance: He is well-developed. He is not diaphoretic.  HENT:     Head: Normocephalic and atraumatic.     Right Ear: External ear normal.     Left Ear: External ear normal.     Nose: Nose normal.     Mouth/Throat:     Mouth: Mucous membranes are moist.     Dentition: Abnormal dentition.      Comments: Numerous missing teeth (circled in black). Newly removed teeth (circled in red). Socket/gingiva is heeling well. Small amount of food particle in socket, but no erythema. Unable to express puss. No fluctuance appreciated. Eyes:     General: No scleral icterus.    Conjunctiva/sclera: Conjunctivae normal.  Neck:     Trachea: Phonation normal.  Cardiovascular:     Rate and Rhythm: Normal rate and regular rhythm.  Pulmonary:     Effort: Pulmonary effort is normal. No respiratory distress.     Breath sounds: No stridor.  Abdominal:     General: There is no distension.  Musculoskeletal:        General: Normal range of motion.     Cervical back: Normal range of motion.  Neurological:     Mental Status: He is alert and oriented to person, place, and time.  Psychiatric:        Behavior: Behavior normal.    ED Results and Treatments Labs (all labs ordered are listed, but only abnormal results are displayed) Labs Reviewed - No data to display                                                                                                                       EKG  EKG Interpretation  Date/Time:    Ventricular  Rate:    PR Interval:    QRS Duration:   QT Interval:    QTC Calculation:   R Axis:     Text Interpretation:         Radiology No results found.  Pertinent labs & imaging results that were available during my care of the patient were reviewed by me and considered in my medical decision making (see MDM for details).  Medications Ordered in ED Medications - No data to display  Procedures Procedures  (including critical care time)  Medical Decision Making / ED Course I have reviewed the nursing notes for this encounter and the patient's prior records (if available in EHR or on provided paperwork).  Duvall Comes was evaluated in Emergency Department on 06/22/2021 for the symptoms described in the history of present illness. He was evaluated in the context of the global COVID-19 pandemic, which necessitated consideration that the patient might be at risk for infection with the SARS-CoV-2 virus that causes COVID-19. Institutional protocols and algorithms that pertain to the evaluation of patients at risk for COVID-19 are in a state of rapid change based on information released by regulatory bodies including the CDC and federal and state organizations. These policies and algorithms were followed during the patient's care in the ED.     Well-healing dental extraction without evidence of superimposed infection or drainable abscess. No dry socket concerning for ostial involvement. Prescribed Peridex mouthwash. Recommended close follow-up with a dentist.    Final Clinical Impression(s) / ED Diagnoses Final diagnoses:  Post-operative pain   The patient appears reasonably screened and/or stabilized for discharge and I doubt any other medical condition or other [facility] requiring further screening, evaluation, or treatment in the ED at this time prior to discharge.  Safe for discharge with strict return precautions.  Disposition: Discharge  Condition: Good  I have discussed the results, Dx and Tx plan with the patient/family who expressed understanding and agree(s) with the plan. Discharge instructions discussed at length. The patient/family was given strict return precautions who verbalized understanding of the instructions. No further questions at time of discharge.    ED Discharge Orders          Ordered    chlorhexidine (PERIDEX) 0.12 % solution  2 times daily        06/22/21 0158              Follow Up: Dentist  Call today     This chart was dictated using voice recognition software.  Despite best efforts to proofread,  errors can occur which can change the documentation meaning.    Fatima Blank, MD 06/22/21 [phone]

## 2021-07-02 ENCOUNTER — Other Ambulatory Visit: Payer: Self-pay | Admitting: Gastroenterology

## 2021-07-22 ENCOUNTER — Other Ambulatory Visit: Payer: Self-pay | Admitting: Family Medicine

## 2021-07-22 DIAGNOSIS — I10 Essential (primary) hypertension: Secondary | ICD-10-CM

## 2021-07-22 DIAGNOSIS — E1140 Type 2 diabetes mellitus with diabetic neuropathy, unspecified: Secondary | ICD-10-CM

## 2021-07-31 ENCOUNTER — Telehealth: Payer: Self-pay

## 2021-07-31 NOTE — Telephone Encounter (Signed)
Patient is due for Annual Wellness Visit. Call patient- no answer or voice mail to leave a message.

## 2021-08-06 DIAGNOSIS — Z03818 Encounter for observation for suspected exposure to other biological agents ruled out: Secondary | ICD-10-CM | POA: Diagnosis not present

## 2021-08-06 DIAGNOSIS — I10 Essential (primary) hypertension: Secondary | ICD-10-CM | POA: Diagnosis not present

## 2021-08-06 DIAGNOSIS — Z20822 Contact with and (suspected) exposure to covid-19: Secondary | ICD-10-CM | POA: Diagnosis not present

## 2021-08-06 DIAGNOSIS — Z0130 Encounter for examination of blood pressure without abnormal findings: Secondary | ICD-10-CM | POA: Diagnosis not present

## 2021-08-10 ENCOUNTER — Telehealth: Payer: Self-pay | Admitting: Family Medicine

## 2021-08-10 NOTE — Telephone Encounter (Signed)
LVM for pt to call the office to schedule AWV-I. Please schedule AWV-I if pt calls the office.

## 2021-09-03 ENCOUNTER — Ambulatory Visit: Payer: 59 | Admitting: Family Medicine

## 2021-09-26 ENCOUNTER — Other Ambulatory Visit: Payer: Self-pay | Admitting: Family Medicine

## 2021-10-19 ENCOUNTER — Other Ambulatory Visit: Payer: Self-pay | Admitting: Gastroenterology

## 2021-10-25 ENCOUNTER — Other Ambulatory Visit: Payer: Self-pay | Admitting: Gastroenterology

## 2021-10-28 ENCOUNTER — Other Ambulatory Visit: Payer: Self-pay | Admitting: Gastroenterology

## 2021-11-07 ENCOUNTER — Other Ambulatory Visit: Payer: Self-pay

## 2021-11-07 ENCOUNTER — Emergency Department (HOSPITAL_COMMUNITY): Payer: Medicare Other

## 2021-11-07 ENCOUNTER — Encounter (HOSPITAL_COMMUNITY): Payer: Self-pay

## 2021-11-07 ENCOUNTER — Emergency Department (HOSPITAL_COMMUNITY)
Admission: EM | Admit: 2021-11-07 | Discharge: 2021-11-07 | Disposition: A | Discharge status: Home / Self Care | Payer: Medicare Other | Attending: Emergency Medicine | Admitting: Emergency Medicine

## 2021-11-07 DIAGNOSIS — M25512 Pain in left shoulder: Secondary | ICD-10-CM | POA: Diagnosis not present

## 2021-11-07 DIAGNOSIS — Z7984 Long term (current) use of oral hypoglycemic drugs: Secondary | ICD-10-CM | POA: Insufficient documentation

## 2021-11-07 DIAGNOSIS — G8929 Other chronic pain: Secondary | ICD-10-CM | POA: Diagnosis not present

## 2021-11-07 DIAGNOSIS — I10 Essential (primary) hypertension: Secondary | ICD-10-CM | POA: Diagnosis not present

## 2021-11-07 DIAGNOSIS — Z79899 Other long term (current) drug therapy: Secondary | ICD-10-CM | POA: Diagnosis not present

## 2021-11-07 DIAGNOSIS — F17210 Nicotine dependence, cigarettes, uncomplicated: Secondary | ICD-10-CM | POA: Insufficient documentation

## 2021-11-07 DIAGNOSIS — E119 Type 2 diabetes mellitus without complications: Secondary | ICD-10-CM | POA: Insufficient documentation

## 2021-11-07 DIAGNOSIS — M19012 Primary osteoarthritis, left shoulder: Secondary | ICD-10-CM | POA: Diagnosis not present

## 2021-11-07 MED ORDER — IBUPROFEN 600 MG PO TABS: 600.0000 mg | ORAL_TABLET | Freq: Four times a day (QID) | ORAL | 0 refills | Status: DC | PRN

## 2021-11-07 MED ORDER — LIDOCAINE 5 % EX OINT: 1.0000 "application " | TOPICAL_OINTMENT | CUTANEOUS | 0 refills | Status: DC | PRN

## 2021-11-07 MED ORDER — KETOROLAC TROMETHAMINE 60 MG/2ML IM SOLN
30.0000 mg | Freq: Once | INTRAMUSCULAR | Status: AC
  Administered 2021-11-07: 10:00:00 30 mg via INTRAMUSCULAR
  Filled 2021-11-07: qty 2

## 2021-11-07 NOTE — Discharge Instructions (Addendum)
You were seen here today for evaluation of your ongoing left shoulder pain.  Prescribed 600 mg ibuprofen and lidocaine to use as needed for pain.  Please take ibuprofen with food.  Please follow-up with orthopedic surgeon for further evaluation. Information provided in this discharge paperwork.  If you have any weakness, numbness/tingling, or color changes to the arm, please return the nearest emergency room for reevaluation.  If you have any concern, new or worsening symptoms, please return nearest emergency department for reevaluation.

## 2021-11-07 NOTE — ED Triage Notes (Signed)
Patient c/o left shoulder pain x 1 1/2 months but worsening in the past few days.

## 2021-11-07 NOTE — ED Provider Notes (Signed)
[facility] DEPT Provider Note   CSN: [id number] Arrival date & time: 11/07/21  6237     History No chief complaint on file.   Jared [name] is a 55 y.o. male presents to the emergency department with 6 weeks of left shoulder pain.  Denies any traumas, falls, or MVC's recently.  Denies any numbness or tingling.  Denies any chest pain or shortness of breath.  Patient rates the pain exacerbated by movement and relieved at rest.  Medical history includes Crohn's, diabetes, hypertension.  Surgical history includes a bowel resection in 2013.  Allergic to trazodone.  Daily smoker.  HPI     Past Medical History:  Diagnosis Date   Crohn disease ([place]) 2008   with hospital admission in 2011, and 8/12 for flare up and SBO relieved with bowel rest. no suregery, no meds for CD   Diabetes mellitus type II    Hypertension     Patient Active Problem List   Diagnosis Date Noted   Ileus ([place]) 02/21/2020   Acute Crohn's disease with intestinal obstruction ([place]) 02/20/2020   SBO (small bowel obstruction) ([place])    Chest pain 11/05/2018   HTN (hypertension) 11/05/2018   Cervical spine fracture ([place]) 07/28/2017   C2 cervical fracture ([place]) 07/28/2017   Dyslipidemia 09/06/2014   Left facial swelling 09/06/2014   Dental caries 09/06/2014   Crohn's disease ([place]) 06/30/2014   Tobacco use disorder 06/30/2014   Diabetes mellitus type II, non insulin dependent ([place]) 06/30/2014   Crohn disease ([place]) 05/10/2014   Intra-abdominal abscess ([place]) 02/13/2012   S/P exploratory laparotomy with ileocecectomy 02/13/2012   Hypokalemia 02/13/2012   Hypomagnesemia 02/13/2012   CD (Crohn's disease) ([place]) 01/20/2012   DM (diabetes mellitus) ([place]) 01/20/2012    Past Surgical History:  Procedure Laterality Date   BOWEL RESECTION  01/25/2012   Procedure: SMALL BOWEL RESECTION;  Surgeon: Marcello Moores A. Cornett, MD;  Location: [facility];  Service: General;  Laterality: N/A;   FINGER  AMPUTATION  ~ 2000   partial; right" pointer"   LAPAROTOMY  01/25/2012   Procedure: EXPLORATORY LAPAROTOMY;  Surgeon: [name], MD;  Location: [facility] OR;  Service: General;  Laterality: N/A;   SCROTAL SURGERY  ~ 51   "took out a pellet"       Family History  Problem Relation Age of Onset   Cancer Mother        lung   Diabetes Mother    Alopecia Mother    Coronary artery disease Mother    Diabetes Sister    Hyperlipidemia Sister    Hypertension Sister    Angina Brother    Hepatitis Brother    Alcohol abuse Brother    Diabetes Brother    Colon cancer Paternal Grandmother    Esophageal cancer Neg Hx    Stomach cancer Neg Hx    Rectal cancer Neg Hx     Social History   Tobacco Use   Smoking status: Every Day    Packs/day: 0.25    Years: 30.00    Pack years: 7.50    Types: Cigarettes   Smokeless tobacco: Former    Types: Chew    Quit date: 01/20/1997  Vaping Use   Vaping Use: Never used  Substance Use Topics   Alcohol use: Not Currently   Drug use: No    Home Medications Prior to Admission medications   Medication Sig Start Date End Date Taking? Authorizing Provider  ibuprofen (ADVIL) 600 MG tablet Take 1  tablet (600 mg total) by mouth every 6 (six) hours as needed. 11/07/21  Yes Sherrell Puller, PA-C  lidocaine (XYLOCAINE) 5 % ointment Apply 1 application topically as needed. 11/07/21  Yes Sherrell Puller, PA-C  Adalimumab (HUMIRA PEN) 40 MG/0.4ML PNKT Inject 40 mg into the skin every 14 (fourteen) days. 09/16/20   Thornton Park, MD  amoxicillin-clavulanate (AUGMENTIN) 875-125 MG tablet Take 1 tablet by mouth every 12 (twelve) hours. 05/13/21   Jaynee Eagles, PA-C  aspirin-sod bicarb-citric acid (ALKA-SELTZER) 325 MG TBEF tablet Take 325 mg by mouth every 6 (six) hours as needed (congestion).    [provider]  atorvastatin (LIPITOR) 10 MG tablet TAKE 1 TABLET BY MOUTH DAILY 07/22/21   Dorena Dew, FNP  Blood Glucose Monitoring Suppl (TRUE METRIX  METER) w/Device KIT 1 each by Does not apply route 4 (four) times daily -  before meals and at bedtime. 06/21/19   Lanae Boast, FNP  budesonide (ENTOCORT EC) 3 MG 24 hr capsule TAKE 3 CAPSULES(9MG TOTAL)BY MOUTH DAILY 07/02/21   Thornton Park, MD  chlorhexidine (PERIDEX) 0.12 % solution Use as directed 15 mLs in the mouth or throat 2 (two) times daily. 06/22/21   Fatima Blank, MD  erythromycin ophthalmic ointment Place a 1/2 inch ribbon of ointment into the lower eyelid twice a day. 04/08/21   Raspet, Derry Skill, PA-C  glucose blood (TRUE METRIX BLOOD GLUCOSE TEST) test strip Use as instructed 07/06/17   Dorena Dew, FNP  hydrOXYzine (ATARAX/VISTARIL) 10 MG tablet TAKE 1 TABLET (10 MG TOTAL) BY MOUTH 3 (THREE) TIMES DAILY AS NEEDED. 05/26/21 05/26/22  Dorena Dew, FNP  lisinopril (ZESTRIL) 20 MG tablet TAKE 1 TABLET BY MOUTH DAILY 07/22/21   Dorena Dew, FNP  metFORMIN (GLUCOPHAGE) 1000 MG tablet TAKE ONE (1) TABLET BY MOUTH TWICE DAILY WITH A MEAL 07/22/21   Dorena Dew, FNP  omega-3 acid ethyl esters (LOVAZA) 1 g capsule Take 2 g by mouth 2 (two) times daily. 01/15/21   [provider]  TRADJENTA 5 MG TABS tablet TAKE 1 TABLET BY MOUTH DAILY 02/06/21   Dorena Dew, FNP  furosemide (LASIX) 10 MG/ML solution Take by mouth daily.  01/20/12  [provider]  mesalamine (PENTASA) 250 MG CR capsule Take 1,000 mg by mouth 4 (four) times daily.  01/20/12  [provider]  ONGLYZA 5 MG TABS tablet TAKE 1 TABLET (5 MG TOTAL) BY MOUTH DAILY. Patient not taking: Reported on 06/03/2020 06/02/20 06/03/20  Dorena Dew, FNP  pioglitazone (ACTOS) 15 MG tablet Take by mouth daily.  01/20/12  [provider]    Allergies    Trazodone and nefazodone  Review of Systems   Review of Systems  Constitutional:  Negative for fever.  Musculoskeletal:  Positive for arthralgias. Negative for back pain, joint swelling, myalgias and neck pain.  Skin:  Negative  for color change, rash and wound.  Neurological:  Negative for weakness and numbness.  All other systems reviewed and are negative.  Physical Exam Updated Vital Signs BP 130/76 (BP Location: Right Arm)    Pulse 70    Temp 98.4 F (36.9 C) (Oral)    Resp 18    Ht _0  (1.88 m)    Wt 95.3 kg    SpO2 99%    BMI 26.96 kg/m   Physical Exam Vitals and nursing note reviewed.  Constitutional:      General: He is not in acute distress.    Appearance:  He is well-developed. He is not toxic-appearing.  HENT:     Head: Normocephalic and atraumatic.  Eyes:     General: No scleral icterus. Cardiovascular:     Rate and Rhythm: Normal rate and regular rhythm.     Heart sounds: No murmur heard. Pulmonary:     Effort: Pulmonary effort is normal. No respiratory distress.     Breath sounds: Normal breath sounds.  Musculoskeletal:        General: Tenderness present. No swelling, deformity or signs of injury.     Cervical back: Neck supple.     Comments: No overlying skin changes, erythema, ecchymosis, rash, or abrasion noted to the area.  No swelling noted.  No obvious deformity or step-off visualized or palpated.  Patient is tender over the joint of the left shoulder.  He has full range of motion of his fingers wrist and elbow.  Limited range of motion secondary to pain.  Sensation intact.  Compartments are soft.  Cap refill less than 3 seconds.  Radial pulses intact.  Skin:    General: Skin is warm and dry.     Capillary Refill: Capillary refill takes less than 2 seconds.  Neurological:     Mental Status: He is alert.  Psychiatric:        Mood and Affect: Mood normal.    ED Results / Procedures / Treatments   Labs (all labs ordered are listed, but only abnormal results are displayed) Labs Reviewed - No data to display  EKG None  Radiology DG Shoulder Left  Result Date: 11/07/2021 CLINICAL DATA:  Left shoulder pain EXAM: LEFT SHOULDER - 2+ VIEW COMPARISON:  None. FINDINGS: No recent  fracture or dislocation is seen. Cortical irregularity is seen in the lateral aspect of head of the proximal humerus. Bony spurs seen in the [facility] joint. There are no abnormal soft tissue calcifications. IMPRESSION: No recent fracture or dislocation is seen in the left shoulder. Cortical irregularity seen in the lateral aspect of head of the left humerus may be residual from previous injury or suggest degenerative arthritis. Degenerative changes are noted in left AC joint. Electronically Signed   By: Elmer Picker M.D.   On: 11/07/2021 09:08    Procedures Procedures   Medications Ordered in ED Medications  ketorolac (TORADOL) injection 30 mg (30 mg Intramuscular Given 11/07/21 1022)    ED Course  I have reviewed the triage vital signs and the nursing notes.  Pertinent labs & imaging results that were available during my care of the patient were reviewed by me and considered in my medical decision making (see chart for details).  55 year old male presents emergency department for 6 weeks of ongoing left shoulder pain.  Pain is worse on movement.  Low suspicion for ACS given the patient does not have any chest pain, shortness of breath, nausea, or diaphoresis and the pain has been lasting for 6 weeks and is reproducible with movement.  Low suspicion for septic arthritis given time of symptoms and reassuring vital signs.  No overlying warmth or swelling.  X-ray of the left shoulder shows a cortical irregularity seen in the lateral aspect of the head of the left humerus that may be residual from previous injury or suggestion of arthritis.  Patient denies any injury to this area, so this is likely degenerative arthritis.  On physical exam, there is no overlying skin changes noted.  He is forage motion of his fingers wrist and elbow, but has decreased range of motion secondary  to pain with his left shoulder.  Compartments are soft.  Radial pulses intact.  Cap refill less than 3 seconds.  This pain is  likely from the arthritis in his left shoulder.  Return Toradol for an ED treatment.  Recommended he take ibuprofen 600 mg as needed for pain follow-up with his primary care provider for further evaluation and treatment of this condition.  Also offered prescribed him topical lidocaine.  Strict return precautions given.  Patient agrees to plan.  Patient is stable and being discharged home in good condition.    MDM Rules/Calculators/A&P                          Final Clinical Impression(s) / ED Diagnoses Final diagnoses:  Chronic left shoulder pain    Rx / DC Orders ED Discharge Orders          Ordered    lidocaine (XYLOCAINE) 5 % ointment  As needed        11/07/21 1019    ibuprofen (ADVIL) 600 MG tablet  Every 6 hours PRN        11/07/21 1020             Sherrell Puller, [place] 11/08/21 1545    Lacretia Leigh, MD 11/09/21 [phone]

## 2021-11-12 ENCOUNTER — Telehealth: Payer: Self-pay | Admitting: Gastroenterology

## 2021-11-12 NOTE — Telephone Encounter (Signed)
Inbound call from patient pharmacy [facility] states patient needs a medication refill for budesonide

## 2021-11-12 NOTE — Telephone Encounter (Signed)
Pt has not been seen since 2021. Per Dr. Tarri Glenn, unable to authorize refill without an appt.

## 2021-11-14 ENCOUNTER — Emergency Department (HOSPITAL_COMMUNITY)
Admission: EM | Admit: 2021-11-14 | Discharge: 2021-11-14 | Disposition: A | Discharge status: Home / Self Care | Payer: Medicare Other | Attending: Emergency Medicine | Admitting: Emergency Medicine

## 2021-11-14 ENCOUNTER — Encounter (HOSPITAL_COMMUNITY): Payer: Self-pay | Admitting: Emergency Medicine

## 2021-11-14 ENCOUNTER — Other Ambulatory Visit: Payer: Self-pay

## 2021-11-14 DIAGNOSIS — Z7982 Long term (current) use of aspirin: Secondary | ICD-10-CM | POA: Diagnosis not present

## 2021-11-14 DIAGNOSIS — I10 Essential (primary) hypertension: Secondary | ICD-10-CM | POA: Insufficient documentation

## 2021-11-14 DIAGNOSIS — E119 Type 2 diabetes mellitus without complications: Secondary | ICD-10-CM | POA: Insufficient documentation

## 2021-11-14 DIAGNOSIS — F17210 Nicotine dependence, cigarettes, uncomplicated: Secondary | ICD-10-CM | POA: Insufficient documentation

## 2021-11-14 DIAGNOSIS — Z79899 Other long term (current) drug therapy: Secondary | ICD-10-CM | POA: Insufficient documentation

## 2021-11-14 DIAGNOSIS — G8929 Other chronic pain: Secondary | ICD-10-CM | POA: Diagnosis not present

## 2021-11-14 DIAGNOSIS — M25512 Pain in left shoulder: Secondary | ICD-10-CM | POA: Insufficient documentation

## 2021-11-14 DIAGNOSIS — Z7984 Long term (current) use of oral hypoglycemic drugs: Secondary | ICD-10-CM | POA: Insufficient documentation

## 2021-11-14 MED ORDER — METHOCARBAMOL 500 MG PO TABS: 500.0000 mg | ORAL_TABLET | Freq: Two times a day (BID) | ORAL | 0 refills | Status: DC

## 2021-11-14 MED ORDER — NAPROXEN 500 MG PO TABS: 500.0000 mg | ORAL_TABLET | Freq: Two times a day (BID) | ORAL | 0 refills | Status: DC

## 2021-11-14 MED ORDER — KETOROLAC TROMETHAMINE 30 MG/ML IJ SOLN
30.0000 mg | Freq: Once | INTRAMUSCULAR | Status: AC
  Administered 2021-11-14: 30 mg via INTRAMUSCULAR
  Filled 2021-11-14: qty 1

## 2021-11-14 MED ORDER — LIDOCAINE 5 % EX PTCH: 1.0000 | MEDICATED_PATCH | CUTANEOUS | 0 refills | Status: DC

## 2021-11-14 MED ORDER — LIDOCAINE 5 % EX PTCH
1.0000 | MEDICATED_PATCH | CUTANEOUS | Status: DC
  Administered 2021-11-14: 1 via TRANSDERMAL
  Filled 2021-11-14: qty 1

## 2021-11-14 NOTE — ED Triage Notes (Signed)
Pt reports left shoulder pain from arthritis since christmas eve. Pt reports relief w/ meds he was given in ED, but reports pain Is back now w/ no relief from home ibuprofen.

## 2021-11-14 NOTE — Discharge Instructions (Signed)
Take the medications as prescribed.  Follow-up with orthopedic  Return for new or worsening symptoms.

## 2021-11-14 NOTE — ED Provider Notes (Signed)
[facility] DEPT Provider Note   CSN: [id number] Arrival date & time: 11/14/21  1431    History Chief Complaint  Patient presents with   Shoulder Pain    Jared [name] is a 55 y.o. male with past medical history significant for Crohn's, diabetes, hypertension who presents for evaluation of left shoulder pain.  Patient states has had left shoulder pain. Patient states he has had left shoulder pain over the last 8 to 9 weeks.  Pain worse with movement.  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  Pain worse with raising arm overhead.  No known traumatic injuries.  No paresthesias, weakness.  No redness, swelling, warmth to the joint.  No headache, neck pain, chest pain, shortness of breath, back pain.  Pain has not changed.  States when he was seen here previously about a week and a half ago he got a shot of Toradol which significantly helped his pain.  His pain only came back yesterday after doing more repetitive motions at work.  States he has an appointment next week to follow-up with orthopedics.  No fever, emesis.  Pain relieved with rest. Denies additional aggravating or alleviating factors.  History obtained from patient and past medical records.  No interpreter is used.  HPI     Past Medical History:  Diagnosis Date   Crohn disease ([place]) 2008   with hospital admission in 2011, and 8/12 for flare up and SBO relieved with bowel rest. no suregery, no meds for CD   Diabetes mellitus type II    Hypertension     Patient Active Problem List   Diagnosis Date Noted   Ileus ([place]) 02/21/2020   Acute Crohn's disease with intestinal obstruction ([place]) 02/20/2020   SBO (small bowel obstruction) ([place])    Chest pain 11/05/2018   HTN (hypertension) 11/05/2018   Cervical spine fracture ([place]) 07/28/2017   C2 cervical fracture ([place]) 07/28/2017   Dyslipidemia 09/06/2014   Left facial swelling 09/06/2014   Dental caries 09/06/2014    Crohn's disease ([place]) 06/30/2014   Tobacco use disorder 06/30/2014   Diabetes mellitus type II, non insulin dependent ([place]) 06/30/2014   Crohn disease ([place]) 05/10/2014   Intra-abdominal abscess ([place]) 02/13/2012   S/P exploratory laparotomy with ileocecectomy 02/13/2012   Hypokalemia 02/13/2012   Hypomagnesemia 02/13/2012   CD (Crohn's disease) ([place]) 01/20/2012   DM (diabetes mellitus) ([place]) 01/20/2012    Past Surgical History:  Procedure Laterality Date   BOWEL RESECTION  01/25/2012   Procedure: SMALL BOWEL RESECTION;  Surgeon: Marcello Moores A. Cornett, MD;  Location: [facility];  Service: General;  Laterality: N/A;   FINGER AMPUTATION  ~ 2000   partial; right" pointer"   LAPAROTOMY  01/25/2012   Procedure: EXPLORATORY LAPAROTOMY;  Surgeon: [name], MD;  Location: [facility];  Service: General;  Laterality: N/A;   SCROTAL SURGERY  ~ 25   "took out a pellet"       Family History  Problem Relation Age of Onset   Cancer Mother        lung   Diabetes Mother    Alopecia Mother    Coronary artery disease Mother    Diabetes Sister    Hyperlipidemia Sister    Hypertension Sister    Angina Brother    Hepatitis Brother    Alcohol abuse Brother    Diabetes Brother    Colon cancer Paternal Grandmother    Esophageal cancer Neg Hx    Stomach cancer  Neg Hx    Rectal cancer Neg Hx     Social History   Tobacco Use   Smoking status: Every Day    Packs/day: 0.25    Years: 30.00    Pack years: 7.50    Types: Cigarettes   Smokeless tobacco: Former    Types: Chew    Quit date: 01/20/1997  Vaping Use   Vaping Use: Never used  Substance Use Topics   Alcohol use: Not Currently   Drug use: No    Home Medications Prior to Admission medications   Medication Sig Start Date End Date Taking? Authorizing Provider  lidocaine (LIDODERM) 5 % Place 1 patch onto the skin daily. Remove & Discard patch within 12 hours or as directed by MD 11/14/21  Yes Terrel Nesheiwat A, PA-C  methocarbamol  (ROBAXIN) 500 MG tablet Take 1 tablet (500 mg total) by mouth 2 (two) times daily. 11/14/21  Yes Antwane Grose A, PA-C  naproxen (NAPROSYN) 500 MG tablet Take 1 tablet (500 mg total) by mouth 2 (two) times daily. 11/14/21  Yes Jonell Krontz A, PA-C  Adalimumab (HUMIRA PEN) 40 MG/0.4ML PNKT Inject 40 mg into the skin every 14 (fourteen) days. 09/16/20   Thornton Park, MD  amoxicillin-clavulanate (AUGMENTIN) 875-125 MG tablet Take 1 tablet by mouth every 12 (twelve) hours. 05/13/21   Jaynee Eagles, PA-C  aspirin-sod bicarb-citric acid (ALKA-SELTZER) 325 MG TBEF tablet Take 325 mg by mouth every 6 (six) hours as needed (congestion).    [provider]  atorvastatin (LIPITOR) 10 MG tablet TAKE 1 TABLET BY MOUTH DAILY 07/22/21   Dorena Dew, FNP  Blood Glucose Monitoring Suppl (TRUE METRIX METER) w/Device KIT 1 each by Does not apply route 4 (four) times daily -  before meals and at bedtime. 06/21/19   Lanae Boast, FNP  budesonide (ENTOCORT EC) 3 MG 24 hr capsule TAKE 3 CAPSULES(9MG TOTAL)BY MOUTH DAILY 07/02/21   Thornton Park, MD  chlorhexidine (PERIDEX) 0.12 % solution Use as directed 15 mLs in the mouth or throat 2 (two) times daily. 06/22/21   Fatima Blank, MD  erythromycin ophthalmic ointment Place a 1/2 inch ribbon of ointment into the lower eyelid twice a day. 04/08/21   Raspet, Derry Skill, PA-C  glucose blood (TRUE METRIX BLOOD GLUCOSE TEST) test strip Use as instructed 07/06/17   Dorena Dew, FNP  hydrOXYzine (ATARAX/VISTARIL) 10 MG tablet TAKE 1 TABLET (10 MG TOTAL) BY MOUTH 3 (THREE) TIMES DAILY AS NEEDED. 05/26/21 05/26/22  Dorena Dew, FNP  ibuprofen (ADVIL) 600 MG tablet Take 1 tablet (600 mg total) by mouth every 6 (six) hours as needed. 11/07/21   Sherrell Puller, PA-C  lisinopril (ZESTRIL) 20 MG tablet TAKE 1 TABLET BY MOUTH DAILY 07/22/21   Dorena Dew, FNP  metFORMIN (GLUCOPHAGE) 1000 MG tablet TAKE ONE (1) TABLET BY MOUTH TWICE DAILY WITH A MEAL  07/22/21   Dorena Dew, FNP  omega-3 acid ethyl esters (LOVAZA) 1 g capsule Take 2 g by mouth 2 (two) times daily. 01/15/21   [provider]  TRADJENTA 5 MG TABS tablet TAKE 1 TABLET BY MOUTH DAILY 02/06/21   Dorena Dew, FNP  furosemide (LASIX) 10 MG/ML solution Take by mouth daily.  01/20/12  [provider]  mesalamine (PENTASA) 250 MG CR capsule Take 1,000 mg by mouth 4 (four) times daily.  01/20/12  [provider]  ONGLYZA 5 MG TABS tablet TAKE 1 TABLET (5 MG TOTAL) BY MOUTH DAILY. Patient not  taking: Reported on 06/03/2020 06/02/20 06/03/20  Dorena Dew, FNP  pioglitazone (ACTOS) 15 MG tablet Take by mouth daily.  01/20/12  [provider]    Allergies    Trazodone and nefazodone  Review of Systems   Review of Systems  Constitutional: Negative.   HENT: Negative.    Respiratory: Negative.    Cardiovascular: Negative.   Genitourinary: Negative.   Musculoskeletal:  Negative for back pain, gait problem, neck pain and neck stiffness.       Left shoulder pain  Skin: Negative.   Neurological: Negative.   All other systems reviewed and are negative.  Physical Exam Updated Vital Signs BP (!) 170/77 (BP Location: Right Arm)    Pulse 86    Temp 98.2 F (36.8 C) (Oral)    Resp 18    SpO2 98%   Physical Exam Vitals and nursing note reviewed.  Constitutional:      General: He is not in acute distress.    Appearance: He is well-developed. He is not ill-appearing, toxic-appearing or diaphoretic.  HENT:     Head: Normocephalic and atraumatic.     Nose: Nose normal.     Mouth/Throat:     Mouth: Mucous membranes are moist.  Eyes:     Pupils: Pupils are equal, round, and reactive to light.  Cardiovascular:     Rate and Rhythm: Normal rate and regular rhythm.     Pulses: Normal pulses.          Radial pulses are 2+ on the right side and 2+ on the left side.     Heart sounds: Normal heart sounds.  Pulmonary:     Effort: Pulmonary effort is  normal. No respiratory distress.     Breath sounds: Normal breath sounds.  Abdominal:     General: Bowel sounds are normal. There is no distension.     Palpations: Abdomen is soft.  Musculoskeletal:        General: Normal range of motion.     Cervical back: Normal range of motion and neck supple.     Comments: No midline tenderness to C/T/L tenderness. Tenderness to left shoulder with overhead motion.  Positive Hawkins test.  Nontender clavicle, scapula, midshaft, distal humerus, forearm.  No overlying erythema or warmth.  Compartments soft.  Skin:    General: Skin is warm and dry.     Capillary Refill: Capillary refill takes less than 2 seconds.     Comments: No edema, erythema or warmth.  No fluctuance or induration.  Neurological:     General: No focal deficit present.     Mental Status: He is alert and oriented to person, place, and time.     Cranial Nerves: Cranial nerves 2-12 are intact.     Sensory: Sensation is intact.     Motor: Motor function is intact.     Gait: Gait is intact.    ED Results / Procedures / Treatments   Labs (all labs ordered are listed, but only abnormal results are displayed) Labs Reviewed - No data to display  EKG None  Radiology No results found.  Procedures Procedures   Medications Ordered in ED Medications  lidocaine (LIDODERM) 5 % 1 patch (1 patch Transdermal Patch Applied 11/14/21 1536)  ketorolac (TORADOL) 30 MG/ML injection 30 mg (30 mg Intramuscular Given 11/14/21 1533)   ED Course  I have reviewed the triage vital signs and the nursing notes.  Pertinent labs & imaging results that were available during my care of  the patient were reviewed by me and considered in my medical decision making (see chart for details).  Pleasant 55 year old here for evaluation of left shoulder pain worse with movement over the last 2 months.  Afebrile, nonseptic, not ill-appearing.  Does repetitive motions for work.  No radicular symptoms.  Compartments  soft.  Neurovascularly intact.  Pain with overhead movement to left shoulder, positive Hawkins test.  Follow-up appointment with Ortho.  When he was seen here a little over 1 week ago received Toradol injection which he states significantly helped his pain.  Do not feel any imaging at this time.  DC home with symptomatic management and close outpatient FU.  Low suspicion for occult fracture, septic joint, gout, hemarthrosis, myositis, VTE, ischemic injury, AAA, ACS, dissection.  The patient has been appropriately medically screened and/or stabilized in the ED. I have low suspicion for any other emergent medical condition which would require further screening, evaluation or treatment in the ED or require inpatient management.  Patient is hemodynamically stable and in no acute distress.  Patient able to ambulate in department prior to ED.  Evaluation does not show acute pathology that would require ongoing or additional emergent interventions while in the emergency department or further inpatient treatment.  I have discussed the diagnosis with the patient and answered all questions.  Pain is been managed while in the emergency department and patient has no further complaints prior to discharge.  Patient is comfortable with plan discussed in room and is stable for discharge at this time.  I have discussed strict return precautions for returning to the emergency department.  Patient was encouraged to follow-up with PCP/specialist refer to at discharge.     MDM Rules/Calculators/A&P                             Final Clinical Impression(s) / ED Diagnoses Final diagnoses:  Chronic left shoulder pain    Rx / DC Orders ED Discharge Orders          Ordered    lidocaine (LIDODERM) 5 %  Every 24 hours        11/14/21 1554    methocarbamol (ROBAXIN) 500 MG tablet  2 times daily        11/14/21 1554    naproxen (NAPROSYN) 500 MG tablet  2 times daily        11/14/21 1554              Starkeisha Vanwinkle A, PA-C 11/14/21 1555    Valarie Merino, MD 11/17/21 1039

## 2021-12-01 DIAGNOSIS — M25512 Pain in left shoulder: Secondary | ICD-10-CM | POA: Diagnosis not present

## 2021-12-14 ENCOUNTER — Other Ambulatory Visit: Payer: Self-pay | Admitting: Gastroenterology

## 2021-12-16 ENCOUNTER — Other Ambulatory Visit: Payer: Self-pay | Admitting: Family Medicine

## 2021-12-16 DIAGNOSIS — E1140 Type 2 diabetes mellitus with diabetic neuropathy, unspecified: Secondary | ICD-10-CM

## 2021-12-29 ENCOUNTER — Encounter: Payer: Self-pay | Admitting: Family Medicine

## 2021-12-29 ENCOUNTER — Ambulatory Visit (INDEPENDENT_AMBULATORY_CARE_PROVIDER_SITE_OTHER): Payer: Medicare Other | Admitting: Family Medicine

## 2021-12-29 ENCOUNTER — Other Ambulatory Visit: Payer: Self-pay

## 2021-12-29 VITALS — BP 132/85 | HR 86 | Temp 97.4°F | Ht 73.0 in | Wt 210.4 lb

## 2021-12-29 DIAGNOSIS — E785 Hyperlipidemia, unspecified: Secondary | ICD-10-CM

## 2021-12-29 DIAGNOSIS — E1140 Type 2 diabetes mellitus with diabetic neuropathy, unspecified: Secondary | ICD-10-CM

## 2021-12-29 DIAGNOSIS — I10 Essential (primary) hypertension: Secondary | ICD-10-CM

## 2021-12-29 DIAGNOSIS — F17200 Nicotine dependence, unspecified, uncomplicated: Secondary | ICD-10-CM

## 2021-12-29 LAB — POCT URINALYSIS DIPSTICK
Bilirubin, UA: NEGATIVE
Blood, UA: NEGATIVE
Glucose, UA: NEGATIVE
Ketones, UA: NEGATIVE
Leukocytes, UA: NEGATIVE
Nitrite, UA: NEGATIVE
Protein, UA: POSITIVE — AB
Spec Grav, UA: >=1.03 — AB (ref 1.010–1.025)
Urobilinogen, UA: 0.2 E.U./dL
pH, UA: 6 (ref 5.0–8.0)

## 2021-12-29 LAB — POCT GLYCOSYLATED HEMOGLOBIN (HGB A1C)
HbA1c POC (<> result, manual entry): 7.4 % (ref 4.0–5.6)
HbA1c, POC (controlled diabetic range): 7.4 % — AB (ref 0.0–7.0)
HbA1c, POC (prediabetic range): 7.4 % — AB (ref 5.7–6.4)
Hemoglobin A1C: 7.4 % — AB (ref 4.0–5.6)

## 2021-12-29 LAB — GLUCOSE, POCT (MANUAL RESULT ENTRY): POC Glucose: 153 mg/dl — AB (ref 70–99)

## 2021-12-29 NOTE — Progress Notes (Signed)
Patient [facility]   Established Patient Office Visit  Subjective:  Patient ID: Jared [name], male    DOB: 1966-06-08  Age: 56 y.o. MRN: [id number]  CC:  Chief Complaint  Patient presents with   Follow-up    Pt is here for follow up visit. Pt need a referral Gastroenterology    HPI Rip Hawes is a 57 year old male with a medical history significant for type 2 diabetes mellitus, dyslipidemia, hypertension, and Crohn's disease that presents for follow-up of his chronic conditions.  Patient states that he has not been checking blood glucose consistently at home.  He typically follows a carbohydrate modified diet divided over small meals today.  Patient also says that he started sea moss for added health benefits.  He denies any blurred vision, dizziness, polyuria, polydipsia, or polyphagia. Patient also has a history of hypertension.  He does not check blood pressure at home.  He does have a family history of heart disease.  He currently denies any chest pain, heart palpitations, lower extremity swelling, or tachypnea.  Past Medical History:  Diagnosis Date   Crohn disease ([place]) 2008   with hospital admission in 2011, and 8/12 for flare up and SBO relieved with bowel rest. no suregery, no meds for CD   Diabetes mellitus type II    Hypertension     Past Surgical History:  Procedure Laterality Date   BOWEL RESECTION  01/25/2012   Procedure: SMALL BOWEL RESECTION;  Surgeon: [name], MD;  Location: [facility];  Service: General;  Laterality: N/A;   FINGER AMPUTATION  ~ 2000   partial; right" pointer"   LAPAROTOMY  01/25/2012   Procedure: EXPLORATORY LAPAROTOMY;  Surgeon: [name], MD;  Location: [facility];  Service: General;  Laterality: N/A;   SCROTAL SURGERY  ~ 19   "took out a pellet"    Family History  Problem Relation Age of Onset   Cancer Mother        lung   Diabetes Mother    Alopecia Mother    Coronary artery disease  Mother    Diabetes Sister    Hyperlipidemia Sister    Hypertension Sister    Angina Brother    Hepatitis Brother    Alcohol abuse Brother    Diabetes Brother    Colon cancer Paternal Grandmother    Esophageal cancer Neg Hx    Stomach cancer Neg Hx    Rectal cancer Neg Hx     Social History   Socioeconomic History   Marital status: Single    Spouse name: Not on file   Number of children: 0   Years of education: Not on file   Highest education level: Not on file  Occupational History   Not on file  Tobacco Use   Smoking status: Every Day    Packs/day: 0.25    Years: 30.00    Pack years: 7.50    Types: Cigarettes   Smokeless tobacco: Former    Types: Chew    Quit date: 01/20/1997  Vaping Use   Vaping Use: Never used  Substance and Sexual Activity   Alcohol use: Not Currently   Drug use: No   Sexual activity: Not Currently  Other Topics Concern   Not on file  Social History Narrative   Lives in [place].Is single. Has a sister in [place]. He has a twin brother that passed away from autoimmune hepatitis.  Smokes 2-3 cigarettes a day. Drinks beer  every few months. No history of drug abuse. Studied up to 12th grade. Has orange card. No health insurance. Currently employed cleaning buildings.  ° °Social Determinants of Health  ° °Financial Resource Strain: Not on file  °Food Insecurity: Not on file  °Transportation Needs: Not on file  °Physical Activity: Not on file  °Stress: Not on file  °Social Connections: Not on file  °Intimate Partner Violence: Not on file  ° ° °Outpatient Medications Prior to Visit  °Medication Sig Dispense Refill  ° Adalimumab (HUMIRA PEN) 40 MG/0.4ML PNKT Inject 40 mg into the skin every 14 (fourteen) days. 2 each 11  ° atorvastatin (LIPITOR) 10 MG tablet TAKE 1 TABLET BY MOUTH DAILY 30 tablet 10  ° Blood Glucose Monitoring Suppl (TRUE METRIX METER) w/Device KIT 1 each by Does not apply route 4 (four) times daily -  before meals and at bedtime. 1 kit  0  ° budesonide (ENTOCORT EC) 3 MG 24 hr capsule TAKE 3 CAPSULES(9MG TOTAL)BY MOUTH DAILY 90 capsule 3  ° glucose blood (TRUE METRIX BLOOD GLUCOSE TEST) test strip Use as instructed 100 each 12  ° hydrOXYzine (ATARAX/VISTARIL) 10 MG tablet TAKE 1 TABLET (10 MG TOTAL) BY MOUTH 3 (THREE) TIMES DAILY AS NEEDED. 30 tablet 0  ° ibuprofen (ADVIL) 600 MG tablet Take 1 tablet (600 mg total) by mouth every 6 (six) hours as needed. 20 tablet 0  ° lidocaine (LIDODERM) 5 % Place 1 patch onto the skin daily. Remove & Discard patch within 12 hours or as directed by MD 30 patch 0  ° lisinopril (ZESTRIL) 20 MG tablet TAKE 1 TABLET BY MOUTH DAILY 30 tablet 10  ° metFORMIN (GLUCOPHAGE) 1000 MG tablet TAKE ONE (1) TABLET BY MOUTH TWICE DAILY WITH A MEAL 60 tablet 10  ° naproxen (NAPROSYN) 500 MG tablet Take 1 tablet (500 mg total) by mouth 2 (two) times daily. 30 tablet 0  ° omega-3 acid ethyl esters (LOVAZA) 1 g capsule Take 2 g by mouth 2 (two) times daily.    ° TRADJENTA 5 MG TABS tablet TAKE 1 TABLET BY MOUTH DAILY 30 tablet 10  ° amoxicillin-clavulanate (AUGMENTIN) 875-125 MG tablet Take 1 tablet by mouth every 12 (twelve) hours. (Patient not taking: Reported on 12/29/2021) 20 tablet 0  ° aspirin-sod bicarb-citric acid (ALKA-SELTZER) 325 MG TBEF tablet Take 325 mg by mouth every 6 (six) hours as needed (congestion). (Patient not taking: Reported on 12/29/2021)    ° chlorhexidine (PERIDEX) 0.12 % solution Use as directed 15 mLs in the mouth or throat 2 (two) times daily. 120 mL 0  ° erythromycin ophthalmic ointment Place a 1/2 inch ribbon of ointment into the lower eyelid twice a day. (Patient not taking: Reported on 12/29/2021) 3.5 g 0  ° methocarbamol (ROBAXIN) 500 MG tablet Take 1 tablet (500 mg total) by mouth 2 (two) times daily. (Patient not taking: Reported on 12/29/2021) 20 tablet 0  ° °No facility-administered medications prior to visit.  ° ° °Allergies  °Allergen Reactions  ° Trazodone And Nefazodone   °  hallucinations   ° ° °ROS °Review of Systems  °Constitutional: Negative.   °HENT: Negative.    °Eyes: Negative.   °Respiratory: Negative.    °Gastrointestinal: Negative.   °Endocrine: Negative for polydipsia, polyphagia and polyuria.  °Genitourinary: Negative.   °Musculoskeletal: Negative.   °Psychiatric/Behavioral: Negative.    ° °  °Objective:  °  °Physical Exam °Constitutional:   °   Appearance: Normal appearance.  °HENT:  °     Mouth/Throat:     Pharynx: Oropharynx is clear.  Eyes:     Pupils: Pupils are equal, round, and reactive to light.  Cardiovascular:     Rate and Rhythm: Normal rate and regular rhythm.     Pulses: Normal pulses.  Pulmonary:     Effort: Pulmonary effort is normal.  Abdominal:     General: Bowel sounds are normal.  Musculoskeletal:        General: Normal range of motion.  Skin:    General: Skin is warm.  Neurological:     General: No focal deficit present.     Mental Status: He is alert. Mental status is at baseline.  Psychiatric:        Mood and Affect: Mood normal.        Behavior: Behavior normal.        Thought Content: Thought content normal.        Judgment: Judgment normal.    BP 132/85    Pulse 86    Temp (!) 97.4 F (36.3 C)    Ht 6' 1" (1.854 m)    Wt 210 lb 6 oz (95.4 kg)    SpO2 100%    BMI 27.76 kg/m  Wt Readings from Last 3 Encounters:  12/29/21 210 lb 6 oz (95.4 kg)  11/07/21 210 lb (95.3 kg)  06/21/21 204 lb (92.5 kg)     Health Maintenance Due  Topic Date Due   HEMOGLOBIN A1C  03/03/2021    There are no preventive care reminders to display for this patient.  No results found for: TSH Lab Results  Component Value Date   WBC 6.8 01/16/2021   HGB 13.3 01/16/2021   HCT 41.0 01/16/2021   MCV 93.8 01/16/2021   PLT 256 01/16/2021   Lab Results  Component Value Date   NA 141 01/16/2021   K 4.1 01/16/2021   CO2 22 01/16/2021   GLUCOSE 179 (H) 01/16/2021   BUN 16 01/16/2021   CREATININE 1.08 01/16/2021   BILITOT 0.3 09/02/2020   ALKPHOS 82  09/02/2020   AST 12 09/02/2020   ALT 17 09/02/2020   PROT 6.5 09/02/2020   ALBUMIN 4.2 09/02/2020   CALCIUM 8.9 01/16/2021   ANIONGAP 11 01/16/2021   Lab Results  Component Value Date   CHOL 100 07/19/2019   Lab Results  Component Value Date   HDL 38 (L) 07/19/2019   Lab Results  Component Value Date   LDLCALC 48 07/19/2019   Lab Results  Component Value Date   TRIG 61 07/19/2019   Lab Results  Component Value Date   CHOLHDL 2.6 07/19/2019   Lab Results  Component Value Date   HGBA1C 9.0 (A) 09/02/2020      Assessment & Plan:   Problem List Items Addressed This Visit       Endocrine   DM (diabetes mellitus) ([place]) - Primary   Relevant Orders   POCT glycosylated hemoglobin (Hb A1C)   CMP and Liver     Other   Tobacco use disorder   Dyslipidemia   Relevant Orders   Lipid Panel   Other Visit Diagnoses     Essential hypertension         1. Type 2 diabetes mellitus with diabetic neuropathy, without long-term current use of insulin (HCC) Patient's hemoglobin A1c is 7.4, which is improved from previous.  Advised to continue to follow a low carbohydrate diet divided over small meals throughout the day.  Also, advised to take medications consistently  in order to achieve positive outcomes. - POCT glycosylated hemoglobin (Hb A1C) - CMP and Liver - Glucose (CBG) - Urinalysis Dipstick  2. Essential hypertension - Continue medication, monitor blood pressure at home. Continue DASH diet.  Reminder to go to the ER if any CP, SOB, nausea, dizziness, severe HA, changes vision/speech, left arm numbness and tingling and jaw pain.   - Urinalysis Dipstick  3. Tobacco use disorder Smoking cessation instruction/counseling given:  counseled patient on the dangers of tobacco use, advised patient to stop smoking, and reviewed strategies to maximize success   4. Dyslipidemia - Lipid Panel   No orders of the defined types were placed in this encounter.   Follow-up:  Return in about 3 months (around 03/28/2022).    [name]  APRN, MSN, FNP-C [facility] [address] [place], [place] [address] [phone]

## 2021-12-29 NOTE — Patient Instructions (Signed)
Diabetes Mellitus Action Plan Following a diabetes action plan is a way for you to manage your diabetes (diabetes mellitus) symptoms. The plan is color-coded to help you understand what actions you need to take based on any symptoms you are having. If you have symptoms in the red zone, you need medical care right away. If you have symptoms in the yellow zone, you are having problems. If you have symptoms in the green zone, you are doing well. Learning about and understanding diabetes can take time. Follow the plan that you develop with your health care provider. Know the target range for your blood sugar (glucose) level, and review your treatment plan with your health care provider at each visit. The target range for my blood sugar level is __________________________ mg/dL. Red zone Get medical help right away if you have any of the following symptoms: A blood sugar test result that is below 54 mg/dL (3 mmol/L). A blood sugar test result that is at or above 240 mg/dL (13.3 mmol/L) for 2 days in a row. Confusion or trouble thinking clearly. Difficulty breathing. Sickness or a fever for 2 or more days that is not getting better. Moderate or large ketone levels in your urine. Feeling tired or having no energy. If you have any red zone symptoms, do not wait to see if the symptoms will go away. Get medical help right away. Call your local emergency services (911 in the [place] Do not drive yourself to the hospital. If you have severely low blood sugar (severe hypoglycemia) and you cannot eat or drink, you may need glucagon. Make sure a family member or close friend knows how to check your blood sugar and how to give you glucagon. You may need to be treated in a hospital for this condition. Yellow zone If you have any of the following symptoms, your diabetes is not under control and you may need to make some changes: A blood sugar test result that is at or above 240 mg/dL (13.3 mmol/L) for 2 days in a  row. Blood sugar test results that are below 70 mg/dL (3.9 mmol/L). Other symptoms of hypoglycemia, such as: Shaking or feeling light-headed. Confusion or irritability. Feeling hungry. Having a fast heartbeat. If you have any yellow zone symptoms: Treat your hypoglycemia by eating or drinking 15 grams of a rapid-acting carbohydrate. Follow the 15:15 rule: Take 15 grams of a rapid-acting carbohydrate, such as: 1 tube of glucose gel. 4 glucose pills. 4 oz (120 mL) of fruit juice. 4 oz (120 mL) of regular (not diet) soda. Check your blood sugar 15 minutes after you take the carbohydrate. If the repeat blood sugar test is still at or below 70 mg/dL (3.9 mmol/L), take 15 grams of a carbohydrate again. If your blood sugar does not increase above 70 mg/dL (3.9 mmol/L) after 3 tries, get medical help right away. After your blood sugar returns to normal, eat a meal or a snack within 1 hour. Keep taking your daily medicines as told by your health care provider. Check your blood sugar more often than you normally would. Write down your results. Call your health care provider if you have trouble keeping your blood sugar in your target range.  Green zone These signs mean you are doing well and you can continue what you are doing to manage your diabetes: Your blood sugar is within your personal target range. For most people, a blood sugar level before a meal (preprandial) should be 80-130 mg/dL (4.4-7.2 mmol/L). You feel  well, and you are able to do daily activities. If you are in the green zone, continue to manage your diabetes as told by your health care provider. To do this: Eat a healthy diet. Exercise regularly. Check your blood sugar as told by your health care provider. Take your medicines as told by your health care provider.  Where to find more information American Diabetes Association (ADA): diabetes.org Association of Diabetes Care & Education Specialists (ADCES):  diabeteseducator.org Summary Following a diabetes action plan is a way for you to manage your diabetes symptoms. The plan is color-coded to help you understand what actions you need to take based on any symptoms you are having. Follow the plan that you develop with your health care provider. Make sure you know your personal target blood sugar level. Review your treatment plan with your health care provider at each visit. This information is not intended to replace advice given to you by your health care provider. Make sure you discuss any questions you have with your health care provider. Document Revised: 05/08/2020 Document Reviewed: 05/08/2020 Elsevier Patient Education  [place].

## 2021-12-30 LAB — CMP AND LIVER
ALT: 23 IU/L (ref 0–44)
AST: 22 IU/L (ref 0–40)
Albumin: 4.5 g/dL (ref 3.8–4.9)
Alkaline Phosphatase: 61 IU/L (ref 44–121)
BUN: 12 mg/dL (ref 6–24)
Bilirubin Total: 0.4 mg/dL (ref 0.0–1.2)
Bilirubin, Direct: 0.13 mg/dL (ref 0.00–0.40)
CO2: 22 mmol/L (ref 20–29)
Calcium: 9.5 mg/dL (ref 8.7–10.2)
Chloride: 108 mmol/L — ABNORMAL HIGH (ref 96–106)
Creatinine, Ser: 0.97 mg/dL (ref 0.76–1.27)
Glucose: 142 mg/dL — ABNORMAL HIGH (ref 70–99)
Potassium: 4.3 mmol/L (ref 3.5–5.2)
Sodium: 143 mmol/L (ref 134–144)
Total Protein: 6.7 g/dL (ref 6.0–8.5)
eGFR: 92 mL/min/{1.73_m2} (ref >59)

## 2021-12-30 LAB — LIPID PANEL
Chol/HDL Ratio: 2.6 ratio (ref 0.0–5.0)
Cholesterol, Total: 113 mg/dL (ref 100–199)
HDL: 43 mg/dL (ref >39)
LDL Chol Calc (NIH): 55 mg/dL (ref 0–99)
Triglycerides: 74 mg/dL (ref 0–149)
VLDL Cholesterol Cal: 15 mg/dL (ref 5–40)

## 2022-01-19 ENCOUNTER — Other Ambulatory Visit: Payer: Self-pay

## 2022-01-19 ENCOUNTER — Other Ambulatory Visit: Payer: Self-pay | Admitting: Family Medicine

## 2022-01-19 DIAGNOSIS — F419 Anxiety disorder, unspecified: Secondary | ICD-10-CM

## 2022-01-19 MED ORDER — HYDROXYZINE HCL 10 MG PO TABS
ORAL_TABLET | Freq: Three times a day (TID) | ORAL | 0 refills | Status: DC | PRN
  Filled 2022-01-19 – 2022-01-26 (×2): qty 30, 10d supply, fill #0

## 2022-01-22 ENCOUNTER — Other Ambulatory Visit: Payer: Self-pay | Admitting: Gastroenterology

## 2022-01-26 ENCOUNTER — Other Ambulatory Visit: Payer: Self-pay

## 2022-01-29 ENCOUNTER — Telehealth: Payer: Self-pay

## 2022-01-29 NOTE — Telephone Encounter (Signed)
Patient called in and stated that he wanted a medicare wellness appt .Marland Kitchen.. can you please call him back! ?

## 2022-02-02 ENCOUNTER — Other Ambulatory Visit: Payer: Self-pay

## 2022-02-16 ENCOUNTER — Other Ambulatory Visit: Payer: Self-pay | Admitting: Gastroenterology

## 2022-02-16 ENCOUNTER — Ambulatory Visit: Payer: Medicare Other

## 2022-02-18 ENCOUNTER — Ambulatory Visit: Payer: Medicare Other

## 2022-02-22 ENCOUNTER — Other Ambulatory Visit: Payer: Self-pay

## 2022-02-22 ENCOUNTER — Emergency Department (HOSPITAL_COMMUNITY)
Admission: EM | Admit: 2022-02-22 | Discharge: 2022-02-22 | Disposition: A | Discharge status: Home / Self Care | Payer: Medicare Other | Attending: Emergency Medicine | Admitting: Emergency Medicine

## 2022-02-22 DIAGNOSIS — M25512 Pain in left shoulder: Secondary | ICD-10-CM | POA: Insufficient documentation

## 2022-02-22 DIAGNOSIS — Z79899 Other long term (current) drug therapy: Secondary | ICD-10-CM | POA: Diagnosis not present

## 2022-02-22 DIAGNOSIS — G8929 Other chronic pain: Secondary | ICD-10-CM | POA: Diagnosis not present

## 2022-02-22 DIAGNOSIS — Z7984 Long term (current) use of oral hypoglycemic drugs: Secondary | ICD-10-CM | POA: Insufficient documentation

## 2022-02-22 MED ORDER — METHOCARBAMOL 500 MG PO TABS: 500.0000 mg | ORAL_TABLET | Freq: Two times a day (BID) | ORAL | 0 refills | Status: DC

## 2022-02-22 MED ORDER — LIDOCAINE 5 % EX PTCH: 1.0000 | MEDICATED_PATCH | CUTANEOUS | 0 refills | Status: DC

## 2022-02-22 MED ORDER — KETOROLAC TROMETHAMINE 30 MG/ML IJ SOLN
30.0000 mg | Freq: Once | INTRAMUSCULAR | Status: AC
  Administered 2022-02-22: 30 mg via INTRAMUSCULAR
  Filled 2022-02-22: qty 1

## 2022-02-22 MED ORDER — NAPROXEN 500 MG PO TABS: 500.0000 mg | ORAL_TABLET | Freq: Two times a day (BID) | ORAL | 0 refills | Status: DC

## 2022-02-22 MED ORDER — HYDROCODONE-ACETAMINOPHEN 5-325 MG PO TABS
1.0000 | ORAL_TABLET | Freq: Once | ORAL | Status: AC
  Administered 2022-02-22: 1 via ORAL
  Filled 2022-02-22: qty 1

## 2022-02-22 NOTE — ED Provider Notes (Signed)
?[facility] DEPT ?Provider Note ? ? ?CSN: [id number] ?Arrival date & time: 02/22/22  0034 ? ?  ? ?History ? ?Chief Complaint  ?Patient presents with  ? Shoulder Pain  ? ? ?Jared [name] is a 56 y.o. male here for evaluation of left shoulder pain.  Has been ongoing for months.  Was seen here initially got a shot of Toradol which he states helped his pain.  Pain is worse with overhead movement.  He subsequently was initially seen by orthopedics got a steroid injection in his shoulder which helped.  He was lost to follow-up.  They told him they thought he had suspected bursitis versus tendinitis.  Pain does not radiate into his arm.  No chest pain, shortness of breath, back pain.  No overlying erythema, warmth.  No new traumatic injuries.  Does state he does repetitive motions for work. ? ?HPI ? ?  ? ?Home Medications ?Prior to Admission medications   ?Medication Sig Start Date End Date Taking? Authorizing Provider  ?lidocaine (LIDODERM) 5 % Place 1 patch onto the skin daily. Remove & Discard patch within 12 hours or as directed by MD 02/22/22  Yes Doshia Dalia A, PA-C  ?methocarbamol (ROBAXIN) 500 MG tablet Take 1 tablet (500 mg total) by mouth 2 (two) times daily. 02/22/22  Yes Sanai Frick A, PA-C  ?naproxen (NAPROSYN) 500 MG tablet Take 1 tablet (500 mg total) by mouth 2 (two) times daily. 02/22/22  Yes Petronella Shuford A, PA-C  ?Adalimumab (HUMIRA PEN) 40 MG/0.4ML PNKT Inject 40 mg into the skin every 14 (fourteen) days. 09/16/20   Thornton Park, MD  ?atorvastatin (LIPITOR) 10 MG tablet TAKE 1 TABLET BY MOUTH DAILY 07/22/21   Dorena Dew, FNP  ?Blood Glucose Monitoring Suppl (TRUE METRIX METER) w/Device KIT 1 each by Does not apply route 4 (four) times daily -  before meals and at bedtime. 06/21/19   Lanae Boast, FNP  ?budesonide (ENTOCORT EC) 3 MG 24 hr capsule TAKE 3 CAPSULES(9MG TOTAL)BY MOUTH DAILY 07/02/21   Thornton Park, MD  ?chlorhexidine (PERIDEX) 0.12 %  solution Use as directed 15 mLs in the mouth or throat 2 (two) times daily. 06/22/21   Fatima Blank, MD  ?glucose blood (TRUE METRIX BLOOD GLUCOSE TEST) test strip Use as instructed 07/06/17   Dorena Dew, FNP  ?hydrOXYzine (ATARAX) 10 MG tablet TAKE 1 TABLET (10 MG TOTAL) BY MOUTH 3 (THREE) TIMES DAILY AS NEEDED. 01/19/22 01/19/23  Dorena Dew, FNP  ?ibuprofen (ADVIL) 600 MG tablet Take 1 tablet (600 mg total) by mouth every 6 (six) hours as needed. 11/07/21   Sherrell Puller, PA-C  ?lisinopril (ZESTRIL) 20 MG tablet TAKE 1 TABLET BY MOUTH DAILY 07/22/21   Dorena Dew, FNP  ?metFORMIN (GLUCOPHAGE) 1000 MG tablet TAKE ONE (1) TABLET BY MOUTH TWICE DAILY WITH A MEAL 07/22/21   Dorena Dew, FNP  ?omega-3 acid ethyl esters (LOVAZA) 1 g capsule Take 2 g by mouth 2 (two) times daily. 01/15/21   [provider]  ?TRADJENTA 5 MG TABS tablet TAKE 1 TABLET BY MOUTH DAILY 12/17/21   Dorena Dew, FNP  ?furosemide (LASIX) 10 MG/ML solution Take by mouth daily.  01/20/12  [provider]  ?mesalamine (PENTASA) 250 MG CR capsule Take 1,000 mg by mouth 4 (four) times daily.  01/20/12  [provider]  ?ONGLYZA 5 MG TABS tablet TAKE 1 TABLET (5 MG TOTAL) BY MOUTH DAILY. ?Patient not taking: Reported on 06/03/2020 06/02/20 06/03/20  Dorena Dew, FNP  ?pioglitazone (ACTOS) 15 MG tablet Take by mouth daily.  01/20/12  [provider]  ?   ? ?Allergies    ?Trazodone and nefazodone   ? ?Review of Systems   ?Review of Systems  ?Constitutional: Negative.   ?HENT: Negative.    ?Respiratory: Negative.    ?Cardiovascular: Negative.   ?Gastrointestinal: Negative.   ?Genitourinary: Negative.   ?Musculoskeletal:  Negative for back pain, gait problem, joint swelling, myalgias, neck pain and neck stiffness.  ?     Left shoulder pain  ?Skin: Negative.   ?Neurological: Negative.   ?All other systems reviewed and are negative. ? ?Physical Exam ?Updated Vital Signs ?BP (!) 159/80 (BP Location:  Left Arm)   Pulse 80   Temp 98.5 ?F (36.9 ?C) (Oral)   Resp 17   Ht 6' 1"  (1.854 m)   Wt 97.5 kg   SpO2 95%   BMI 28.37 kg/m?  ?Physical Exam ?Vitals and nursing note reviewed.  ?Constitutional:   ?   General: He is not in acute distress. ?   Appearance: He is well-developed. He is not ill-appearing, toxic-appearing or diaphoretic.  ?HENT:  ?   Head: Normocephalic and atraumatic.  ?Eyes:  ?   Pupils: Pupils are equal, round, and reactive to light.  ?Neck:  ?   Trachea: Trachea and phonation normal.  ?   Comments: No midline tenderness, full range of motion ?Cardiovascular:  ?   Rate and Rhythm: Normal rate and regular rhythm.  ?   Pulses: Normal pulses.     ?     Radial pulses are 2+ on the right side and 2+ on the left side.  ?     Dorsalis pedis pulses are 2+ on the right side and 2+ on the left side.  ?   Heart sounds: Normal heart sounds.  ?Pulmonary:  ?   Effort: Pulmonary effort is normal. No respiratory distress.  ?   Breath sounds: Normal breath sounds and air entry.  ?   Comments: Clear bilaterally, speaks in full sentences without difficulty ?Chest:  ?   Comments: nonTender anterior posterior chest wall ?Abdominal:  ?   General: Bowel sounds are normal. There is no distension.  ?   Palpations: Abdomen is soft.  ?   Tenderness: There is no abdominal tenderness.  ?   Comments: Soft, nontender, no midline pulsatile abdominal mass  ?Musculoskeletal:     ?   General: Normal range of motion.  ?   Cervical back: Full passive range of motion without pain, normal range of motion and neck supple.  ?   Comments: No midline C/T/L tenderness.  Nontender bilateral upper and lower extremities, compartments soft.  Pain with overhead movement left anterior shoulder.  Positive empty can test on the left upper extremity.  Full range of motion right shoulder without difficulty.  No shortening rotation of legs bilaterally.  ?Skin: ?   General: Skin is warm and dry.  ?   Capillary Refill: Capillary refill takes less than  2 seconds.  ?   Comments: No edema, erythema or warmth.  No fluctuance or induration.  No rashes or lesions  ?Neurological:  ?   General: No focal deficit present.  ?   Mental Status: He is alert and oriented to person, place, and time.  ?   Cranial Nerves: Cranial nerves 2-12 are intact.  ?   Sensory: Sensation is intact.  ?   Motor: Motor function is intact.  ?  Gait: Gait is intact.  ? ? ?ED Results / Procedures / Treatments   ?Labs ?(all labs ordered are listed, but only abnormal results are displayed) ?Labs Reviewed - No data to display ? ?EKG ?None ? ?Radiology ?No results found. ? ?Procedures ?Procedures  ? ? ?Medications Ordered in ED ?Medications  ?ketorolac (TORADOL) 30 MG/ML injection 30 mg (30 mg Intramuscular Given 02/22/22 0217)  ?HYDROcodone-acetaminophen (NORCO/VICODIN) 5-325 MG per tablet 1 tablet (1 tablet Oral Given 02/22/22 0217)  ? ? ?ED Course/ Medical Decision Making/ A&P ?  ? ?56 year old here for evaluation of left shoulder pain.  He is afebrile, nonseptic, not ill-appearing.  Sounds like this started months ago.  Had imaging performed at that time which I personally viewed and interpreted.  Does show degenerative changes to lateral aspect head of left humerus as well as left AC joint.  He denies any chest pain, back pain.  Given length of time, history and exam I have low suspicion for acute ACS, PE, dissection.  His compartments are soft, he is no overlying skin changes to his extremities I have low suspicion for VTE, ischemia, septic joint, hemarthrosis, myositis, cellulitis.  I suspect this is acute on chronic pain from degenerative changes vs recurrent bursitis.  We will give pain management here.  We will refer him back to [facility] who he saw previously for his shoulder pain.  Patient agreeable.  Do not feel he needs additional imaging or labs at this time. ? ?The patient has been appropriately medically screened and/or stabilized in the ED. I have low suspicion for any other  emergent medical condition which would require further screening, evaluation or treatment in the ED or require inpatient management. ? ?Patient is hemodynamically stable and in no acute distress.  Patient able to ambulat

## 2022-02-22 NOTE — Discharge Instructions (Signed)
Follow-up with [facility] you saw previously for your shoulder pain. ? ?I have written you for a few medications.  Take as prescribed. ? ?Return for any worsening symptoms such as chest pain, back pain, swelling to extremities, numbness or weakness ?

## 2022-02-22 NOTE — ED Triage Notes (Signed)
Pt with bilateral shoulder bursitis and pain that is worsening.  ? ?

## 2022-03-05 DIAGNOSIS — M25552 Pain in left hip: Secondary | ICD-10-CM | POA: Diagnosis not present

## 2022-03-05 DIAGNOSIS — M25551 Pain in right hip: Secondary | ICD-10-CM | POA: Diagnosis not present

## 2022-03-10 ENCOUNTER — Other Ambulatory Visit: Payer: Self-pay

## 2022-03-10 ENCOUNTER — Emergency Department (HOSPITAL_COMMUNITY)
Admission: EM | Admit: 2022-03-10 | Discharge: 2022-03-10 | Discharge status: Against Medical Advice | Payer: Medicare Other | Attending: Emergency Medicine | Admitting: Emergency Medicine

## 2022-03-10 ENCOUNTER — Encounter (HOSPITAL_COMMUNITY): Payer: Self-pay | Admitting: Emergency Medicine

## 2022-03-10 DIAGNOSIS — F17200 Nicotine dependence, unspecified, uncomplicated: Secondary | ICD-10-CM | POA: Insufficient documentation

## 2022-03-10 DIAGNOSIS — K5090 Crohn's disease, unspecified, without complications: Secondary | ICD-10-CM | POA: Insufficient documentation

## 2022-03-10 DIAGNOSIS — M25511 Pain in right shoulder: Secondary | ICD-10-CM | POA: Diagnosis not present

## 2022-03-10 DIAGNOSIS — I10 Essential (primary) hypertension: Secondary | ICD-10-CM | POA: Diagnosis not present

## 2022-03-10 DIAGNOSIS — Z7984 Long term (current) use of oral hypoglycemic drugs: Secondary | ICD-10-CM | POA: Diagnosis not present

## 2022-03-10 DIAGNOSIS — G8929 Other chronic pain: Secondary | ICD-10-CM | POA: Diagnosis not present

## 2022-03-10 DIAGNOSIS — E119 Type 2 diabetes mellitus without complications: Secondary | ICD-10-CM | POA: Insufficient documentation

## 2022-03-10 DIAGNOSIS — M25512 Pain in left shoulder: Secondary | ICD-10-CM | POA: Insufficient documentation

## 2022-03-10 DIAGNOSIS — F17210 Nicotine dependence, cigarettes, uncomplicated: Secondary | ICD-10-CM | POA: Diagnosis not present

## 2022-03-10 DIAGNOSIS — Z79899 Other long term (current) drug therapy: Secondary | ICD-10-CM | POA: Insufficient documentation

## 2022-03-10 NOTE — Discharge Instructions (Addendum)
Keep you follow up with orthopedics for this Friday for re-evaluation and continued medical management. ? ?Return to the ED for new or worsening symptoms ?

## 2022-03-10 NOTE — ED Provider Notes (Signed)
?[facility] DEPT ?Provider Note ? ? ?CSN: [id number] ?Arrival date & time: 03/10/22  1348 ? ?  ? ?History ? ?Chief Complaint  ?Patient presents with  ? Shoulder Pain  ? ? ?Jared [name] is a 56 y.o. male with chief complaint of acute on chronic bilateral shoulder pain.  Recently been treated in the ED and orthopedics for this pain.  Received a Toradol shot from the ED a few weeks ago which provided some relief.  Received steroid injections from orthopedics last week for his hips, and is set for additional steroid injections for his shoulders at this coming Friday's appointment.  States he is taking the muscle relaxant provided but is without relief.  States he is taking another medication provided by orthopedics that is 7.5 in dosage, but cannot remember the name of this medication.  Believes it may have been naproxen or meloxicam.  Denies recent trauma or injury. ? ?The history is provided by the patient and medical records.  ?Shoulder Pain ? ?  ? ?Home Medications ?Prior to Admission medications   ?Medication Sig Start Date End Date Taking? Authorizing Provider  ?Adalimumab (HUMIRA PEN) 40 MG/0.4ML PNKT Inject 40 mg into the skin every 14 (fourteen) days. 09/16/20   Thornton Park, MD  ?atorvastatin (LIPITOR) 10 MG tablet TAKE 1 TABLET BY MOUTH DAILY 07/22/21   Dorena Dew, FNP  ?Blood Glucose Monitoring Suppl (TRUE METRIX METER) w/Device KIT 1 each by Does not apply route 4 (four) times daily -  before meals and at bedtime. 06/21/19   Lanae Boast, FNP  ?budesonide (ENTOCORT EC) 3 MG 24 hr capsule TAKE 3 CAPSULES(9MG TOTAL)BY MOUTH DAILY 07/02/21   Thornton Park, MD  ?chlorhexidine (PERIDEX) 0.12 % solution Use as directed 15 mLs in the mouth or throat 2 (two) times daily. 06/22/21   Fatima Blank, MD  ?glucose blood (TRUE METRIX BLOOD GLUCOSE TEST) test strip Use as instructed 07/06/17   Dorena Dew, FNP  ?hydrOXYzine (ATARAX) 10 MG tablet TAKE 1 TABLET (10 MG  TOTAL) BY MOUTH 3 (THREE) TIMES DAILY AS NEEDED. 01/19/22 01/19/23  Dorena Dew, FNP  ?ibuprofen (ADVIL) 600 MG tablet Take 1 tablet (600 mg total) by mouth every 6 (six) hours as needed. 11/07/21   Sherrell Puller, PA-C  ?lidocaine (LIDODERM) 5 % Place 1 patch onto the skin daily. Remove & Discard patch within 12 hours or as directed by MD 02/22/22   Henderly, Britni A, PA-C  ?lisinopril (ZESTRIL) 20 MG tablet TAKE 1 TABLET BY MOUTH DAILY 07/22/21   Dorena Dew, FNP  ?metFORMIN (GLUCOPHAGE) 1000 MG tablet TAKE ONE (1) TABLET BY MOUTH TWICE DAILY WITH A MEAL 07/22/21   Dorena Dew, FNP  ?methocarbamol (ROBAXIN) 500 MG tablet Take 1 tablet (500 mg total) by mouth 2 (two) times daily. 02/22/22   Henderly, Britni A, PA-C  ?naproxen (NAPROSYN) 500 MG tablet Take 1 tablet (500 mg total) by mouth 2 (two) times daily. 02/22/22   Henderly, Britni A, PA-C  ?omega-3 acid ethyl esters (LOVAZA) 1 g capsule Take 2 g by mouth 2 (two) times daily. 01/15/21   [provider]  ?TRADJENTA 5 MG TABS tablet TAKE 1 TABLET BY MOUTH DAILY 12/17/21   Dorena Dew, FNP  ?furosemide (LASIX) 10 MG/ML solution Take by mouth daily.  01/20/12  [provider]  ?mesalamine (PENTASA) 250 MG CR capsule Take 1,000 mg by mouth 4 (four) times daily.  01/20/12  [provider]  ?Lance Muss 5  MG TABS tablet TAKE 1 TABLET (5 MG TOTAL) BY MOUTH DAILY. ?Patient not taking: Reported on 06/03/2020 06/02/20 06/03/20  Dorena Dew, FNP  ?pioglitazone (ACTOS) 15 MG tablet Take by mouth daily.  01/20/12  [provider]  ?   ? ?Allergies    ?Trazodone and nefazodone   ? ?Review of Systems   ?Review of Systems  ?Musculoskeletal:   ?     Bilateral shoulder pain  ? ?Physical Exam ?Updated Vital Signs ?BP (!) 147/83 (BP Location: Left Arm)   Pulse 87   Temp 98.3 ?F (36.8 ?C) (Oral)   Resp 18   SpO2 100%  ?Physical Exam ?Vitals and nursing note reviewed.  ?Constitutional:   ?   General: He is not in acute distress. ?    Appearance: He is well-developed.  ?HENT:  ?   Head: Normocephalic and atraumatic.  ?   Mouth/Throat:  ?   Mouth: Mucous membranes are moist.  ?   Pharynx: Oropharynx is clear.  ?Eyes:  ?   Conjunctiva/sclera: Conjunctivae normal.  ?Cardiovascular:  ?   Rate and Rhythm: Normal rate and regular rhythm.  ?   Pulses: Normal pulses.  ?   Heart sounds: Normal heart sounds. No murmur heard. ?Pulmonary:  ?   Effort: Pulmonary effort is normal. No respiratory distress.  ?   Breath sounds: Normal breath sounds.  ?Abdominal:  ?   General: Bowel sounds are normal.  ?   Palpations: Abdomen is soft.  ?   Tenderness: There is no abdominal tenderness.  ?Musculoskeletal:     ?   General: No swelling.  ?   Right shoulder: Decreased range of motion.  ?   Left shoulder: Decreased range of motion.  ?   Cervical back: Neck supple.  ?   Comments: Negative for swelling, deformity, effusion, laceration, bony tenderness, crepitus, or abnormal pulses for the shoulders or upper extremities ?Mild decreased range of motion, including overhead motions, which is consistent with previous exams ?Upper extremities appear neurovascularly intact ?Chest/back nontender.  Compartments soft. ?Radial and PT pulses 2+ bilaterally  ?Skin: ?   General: Skin is warm and dry.  ?   Capillary Refill: Capillary refill takes less than 2 seconds.  ?Neurological:  ?   Mental Status: He is alert and oriented to person, place, and time.  ?Psychiatric:     ?   Mood and Affect: Mood normal.  ? ? ?ED Results / Procedures / Treatments   ?Labs ?(all labs ordered are listed, but only abnormal results are displayed) ?Labs Reviewed - No data to display ? ?EKG ?None ? ?Radiology ?No results found. ? ?Procedures ?Procedures  ? ? ?Medications Ordered in ED ?Medications - No data to display ? ?ED Course/ Medical Decision Making/ A&P ?  ?                        ?Medical Decision Making ? ?56 y.o. male presents to the ED for concern of Shoulder Pain ? Marland Kitchen  This involves an extensive  number of treatment options, and is a complaint that carries with it a high risk of complications and morbidity.  The emergent differential diagnosis prior to evaluation includes, but is not limited to: Arthritis, septic joint, gout, cellulitis, ACS ? ?This is not an exhaustive differential.  ? ?Past Medical History / Co-morbidities / Social History: ?Hx diabetes mellitus, Crohn's disease, prior intraabdominal abscess, tobacco use disorder, dyslipidemia, prior SBO, prior ileus ? ?Additional History:  ?  Internal and external records from outside source obtained and reviewed including ED visits, orthopedics, internal medicine ? ?Physical Exam: ?Physical exam performed. The pertinent findings include: Negative for swelling, deformity, effusion, laceration, bony tenderness, crepitus, or abnormal pulses for the shoulders or upper extremities.  Mild decreased range of motion, including overhead motions, which is consistent with previous exams.  Upper extremities appear neurovascularly intact.  Breathing without increased effort, able to communicate clearly and without difficulty.  Compartments soft.  Chest and back nontender.  Radial and PT pulses 2+ bilaterally. ? ?Lab Tests: ?None ? ?Imaging Studies: ?None ? ?Medications: ?I opted to order a single dose of toradol, but the pt eloped before it could be provided. ? ?ED Course/Disposition: ?Pt well-appearing on exam.  Not suspicious of acute traumatic injury such as fracture or ligamentous tear.  Not suspicious of gout or septic joint based on history, physical exam, and presentation.  No redness of joint.  No warmth of joint.  Pt afebrile.  No evidence of recent trauma to joints.  Known hx of arthritis and being treated for such.  Seen 02/22/22 most recently and prescribed with Lidoderm patches, robaxin, and naproxen.  Pt states these are not providing much relief.  I do not believe new x-ray imaging is appropriate at this time.  Previous imaging visualized, which showed  degenerative changes consistent with today's presentation.  Given length of time, history and exam I have low suspicion for acute ACS, PE, or dissection.   His upper extremity compartments are soft and nontender.  Not

## 2022-03-10 NOTE — ED Triage Notes (Signed)
Pt c/o bilateral shoulder pain that is not going away.  ?

## 2022-03-10 NOTE — ED Notes (Signed)
Pt informed me that he is leaving and cannot wait any further. I advised pt apologies for the delay. Pt still wished to leave. Pt left emergency department.  ?

## 2022-03-18 DIAGNOSIS — M25512 Pain in left shoulder: Secondary | ICD-10-CM | POA: Diagnosis not present

## 2022-03-18 DIAGNOSIS — M25511 Pain in right shoulder: Secondary | ICD-10-CM | POA: Diagnosis not present

## 2022-03-18 DIAGNOSIS — M19019 Primary osteoarthritis, unspecified shoulder: Secondary | ICD-10-CM | POA: Diagnosis not present

## 2022-03-30 ENCOUNTER — Ambulatory Visit (INDEPENDENT_AMBULATORY_CARE_PROVIDER_SITE_OTHER): Payer: Medicare Other | Admitting: Family Medicine

## 2022-03-30 ENCOUNTER — Encounter: Payer: Self-pay | Admitting: Family Medicine

## 2022-03-30 ENCOUNTER — Other Ambulatory Visit: Payer: Self-pay

## 2022-03-30 VITALS — BP 134/76 | HR 85 | Temp 98.2°F | Ht 73.0 in | Wt 207.2 lb

## 2022-03-30 DIAGNOSIS — K5000 Crohn's disease of small intestine without complications: Secondary | ICD-10-CM

## 2022-03-30 DIAGNOSIS — K56609 Unspecified intestinal obstruction, unspecified as to partial versus complete obstruction: Secondary | ICD-10-CM | POA: Insufficient documentation

## 2022-03-30 DIAGNOSIS — E1140 Type 2 diabetes mellitus with diabetic neuropathy, unspecified: Secondary | ICD-10-CM

## 2022-03-30 DIAGNOSIS — F17200 Nicotine dependence, unspecified, uncomplicated: Secondary | ICD-10-CM | POA: Diagnosis not present

## 2022-03-30 DIAGNOSIS — G9589 Other specified diseases of spinal cord: Secondary | ICD-10-CM | POA: Insufficient documentation

## 2022-03-30 DIAGNOSIS — F419 Anxiety disorder, unspecified: Secondary | ICD-10-CM

## 2022-03-30 DIAGNOSIS — M4800 Spinal stenosis, site unspecified: Secondary | ICD-10-CM | POA: Insufficient documentation

## 2022-03-30 DIAGNOSIS — M47812 Spondylosis without myelopathy or radiculopathy, cervical region: Secondary | ICD-10-CM | POA: Insufficient documentation

## 2022-03-30 DIAGNOSIS — M5412 Radiculopathy, cervical region: Secondary | ICD-10-CM | POA: Insufficient documentation

## 2022-03-30 LAB — POCT GLYCOSYLATED HEMOGLOBIN (HGB A1C)
HbA1c POC (<> result, manual entry): 9.3 % (ref 4.0–5.6)
HbA1c, POC (controlled diabetic range): 9.3 % — AB (ref 0.0–7.0)
HbA1c, POC (prediabetic range): 9.3 % — AB (ref 5.7–6.4)
Hemoglobin A1C: 9.3 % — AB (ref 4.0–5.6)

## 2022-03-30 MED ORDER — BUPROPION HCL ER (XL) 150 MG PO TB24
150.0000 mg | ORAL_TABLET | Freq: Every day | ORAL | 2 refills | Status: DC
Start: 2022-03-30 — End: 2022-06-03
  Filled 2022-03-30: qty 30, 30d supply, fill #0

## 2022-03-30 MED ORDER — GABAPENTIN 100 MG PO CAPS
100.0000 mg | ORAL_CAPSULE | Freq: Three times a day (TID) | ORAL | 1 refills | Status: DC
  Filled 2022-03-30: qty 90, 30d supply, fill #0

## 2022-03-30 MED ORDER — HYDROXYZINE HCL 10 MG PO TABS
ORAL_TABLET | Freq: Three times a day (TID) | ORAL | 5 refills | Status: DC | PRN
  Filled 2022-03-30: qty 30, 10d supply, fill #0
  Filled 2022-09-12: qty 30, 10d supply, fill #1
  Filled 2022-11-12 – 2022-12-01 (×2): qty 30, 10d supply, fill #2
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
## 2022-03-31 DIAGNOSIS — M25512 Pain in left shoulder: Secondary | ICD-10-CM | POA: Diagnosis not present

## 2022-03-31 DIAGNOSIS — M25511 Pain in right shoulder: Secondary | ICD-10-CM | POA: Diagnosis not present

## 2022-03-31 LAB — COMPREHENSIVE METABOLIC PANEL
ALT: 23 IU/L (ref 0–44)
AST: 19 IU/L (ref 0–40)
Albumin/Globulin Ratio: 1.8 (ref 1.2–2.2)
Albumin: 4.4 g/dL (ref 3.8–4.9)
Alkaline Phosphatase: 79 IU/L (ref 44–121)
BUN/Creatinine Ratio: 12 (ref 9–20)
BUN: 14 mg/dL (ref 6–24)
Bilirubin Total: 0.4 mg/dL (ref 0.0–1.2)
CO2: 24 mmol/L (ref 20–29)
Calcium: 9.5 mg/dL (ref 8.7–10.2)
Chloride: 101 mmol/L (ref 96–106)
Creatinine, Ser: 1.19 mg/dL (ref 0.76–1.27)
Globulin, Total: 2.5 g/dL (ref 1.5–4.5)
Glucose: 182 mg/dL — ABNORMAL HIGH (ref 70–99)
Potassium: 4.4 mmol/L (ref 3.5–5.2)
Sodium: 142 mmol/L (ref 134–144)
Total Protein: 6.9 g/dL (ref 6.0–8.5)
eGFR: 72 mL/min/{1.73_m2} (ref >59)

## 2022-04-01 NOTE — Progress Notes (Signed)
Jared [name] was seen in clinic on 03/30/2022 for a follow up of chronic conditions. Patient's labs are largely within normal limits. Discussed hemoglobin a1C results, which were increased from previous. Patient advised to follow a lowfat, low carb diet divided over small meals throughout the day.  Follow up in office as scheduled.   Jared Pounds  APRN, MSN, FNP-C Patient [facility] [address] [place], [place] [address] [phone]

## 2022-04-07 DIAGNOSIS — M19019 Primary osteoarthritis, unspecified shoulder: Secondary | ICD-10-CM | POA: Diagnosis not present

## 2022-04-07 DIAGNOSIS — M25512 Pain in left shoulder: Secondary | ICD-10-CM | POA: Diagnosis not present

## 2022-04-07 DIAGNOSIS — M25511 Pain in right shoulder: Secondary | ICD-10-CM | POA: Diagnosis not present

## 2022-04-07 DIAGNOSIS — M75102 Unspecified rotator cuff tear or rupture of left shoulder, not specified as traumatic: Secondary | ICD-10-CM | POA: Diagnosis not present

## 2022-04-20 ENCOUNTER — Telehealth: Payer: Self-pay

## 2022-04-20 NOTE — Telephone Encounter (Signed)
Pt called and said that he just recently had a Surgery and needs a second opinion.

## 2022-05-03 ENCOUNTER — Emergency Department (HOSPITAL_COMMUNITY)
Admission: EM | Admit: 2022-05-03 | Discharge: 2022-05-03 | Disposition: A | Discharge status: Home / Self Care | Payer: Medicare Other | Attending: Emergency Medicine | Admitting: Emergency Medicine

## 2022-05-03 ENCOUNTER — Other Ambulatory Visit: Payer: Self-pay

## 2022-05-03 ENCOUNTER — Encounter (HOSPITAL_COMMUNITY): Payer: Self-pay

## 2022-05-03 ENCOUNTER — Emergency Department (HOSPITAL_COMMUNITY): Payer: Medicare Other

## 2022-05-03 DIAGNOSIS — H9201 Otalgia, right ear: Secondary | ICD-10-CM | POA: Insufficient documentation

## 2022-05-03 DIAGNOSIS — F17200 Nicotine dependence, unspecified, uncomplicated: Secondary | ICD-10-CM | POA: Insufficient documentation

## 2022-05-03 DIAGNOSIS — R42 Dizziness and giddiness: Secondary | ICD-10-CM | POA: Diagnosis not present

## 2022-05-03 DIAGNOSIS — R110 Nausea: Secondary | ICD-10-CM | POA: Diagnosis not present

## 2022-05-03 DIAGNOSIS — E119 Type 2 diabetes mellitus without complications: Secondary | ICD-10-CM | POA: Insufficient documentation

## 2022-05-03 DIAGNOSIS — I10 Essential (primary) hypertension: Secondary | ICD-10-CM | POA: Diagnosis not present

## 2022-05-03 DIAGNOSIS — R519 Headache, unspecified: Secondary | ICD-10-CM | POA: Diagnosis not present

## 2022-05-03 DIAGNOSIS — R29818 Other symptoms and signs involving the nervous system: Secondary | ICD-10-CM | POA: Diagnosis not present

## 2022-05-03 LAB — BASIC METABOLIC PANEL
Anion gap: 7 (ref 5–15)
BUN: 11 mg/dL (ref 6–20)
CO2: 27 mmol/L (ref 22–32)
Calcium: 9.1 mg/dL (ref 8.9–10.3)
Chloride: 109 mmol/L (ref 98–111)
Creatinine, Ser: 0.95 mg/dL (ref 0.61–1.24)
GFR, Estimated: >60 mL/min (ref >60)
Glucose, Bld: 129 mg/dL — ABNORMAL HIGH (ref 70–99)
Potassium: 3.7 mmol/L (ref 3.5–5.1)
Sodium: 143 mmol/L (ref 135–145)

## 2022-05-03 LAB — CBC
HCT: 39.4 % (ref 39.0–52.0)
Hemoglobin: 12.7 g/dL — ABNORMAL LOW (ref 13.0–17.0)
MCH: 28.9 pg (ref 26.0–34.0)
MCHC: 32.2 g/dL (ref 30.0–36.0)
MCV: 89.7 fL (ref 80.0–100.0)
Platelets: 370 10*3/uL (ref 150–400)
RBC: 4.39 MIL/uL (ref 4.22–5.81)
RDW: 14.3 % (ref 11.5–15.5)
WBC: 7.1 10*3/uL (ref 4.0–10.5)
nRBC: 0 % (ref 0.0–0.2)

## 2022-05-03 MED ORDER — SODIUM CHLORIDE 0.9 % IV BOLUS
500.0000 mL | Freq: Once | INTRAVENOUS | Status: AC
  Administered 2022-05-03: 500 mL via INTRAVENOUS

## 2022-05-03 MED ORDER — PROCHLORPERAZINE EDISYLATE 10 MG/2ML IJ SOLN
10.0000 mg | Freq: Once | INTRAMUSCULAR | Status: AC
  Administered 2022-05-03: 10 mg via INTRAVENOUS
  Filled 2022-05-03: qty 2

## 2022-05-03 MED ORDER — MECLIZINE HCL 25 MG PO TABS
25.0000 mg | ORAL_TABLET | Freq: Once | ORAL | Status: AC
  Administered 2022-05-03: 25 mg via ORAL
  Filled 2022-05-03: qty 1

## 2022-05-03 MED ORDER — DIAZEPAM 5 MG/ML IJ SOLN
5.0000 mg | Freq: Once | INTRAMUSCULAR | Status: AC
  Administered 2022-05-03: 5 mg via INTRAVENOUS
  Filled 2022-05-03: qty 2

## 2022-05-03 MED ORDER — DIPHENHYDRAMINE HCL 50 MG/ML IJ SOLN
25.0000 mg | Freq: Once | INTRAMUSCULAR | Status: AC
  Administered 2022-05-03: 25 mg via INTRAVENOUS
  Filled 2022-05-03: qty 1

## 2022-05-03 MED ORDER — MECLIZINE HCL 25 MG PO TABS
25.0000 mg | ORAL_TABLET | Freq: Three times a day (TID) | ORAL | 0 refills | Status: DC | PRN
  Filled 2022-05-03: qty 30, 10d supply, fill #0

## 2022-05-03 NOTE — Discharge Instructions (Addendum)
Your MRI was negative, this means it is less likely that you are having a stroke.  Take the medication as prescribed.  Follow-up with your doctor in the office.  Please return for worsening symptoms or inability to walk.

## 2022-05-03 NOTE — ED Triage Notes (Addendum)
Patient said when he woke up this morning at 4am the room was spinning and he felt dizziness. Did not pass out. Then he got a headache on the right side behind his ear that has been hurting for 2 days. Family history of stroke. NIH score 0.
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
## 2022-05-12 ENCOUNTER — Encounter (HOSPITAL_COMMUNITY): Payer: Self-pay | Admitting: Emergency Medicine

## 2022-05-12 ENCOUNTER — Other Ambulatory Visit: Payer: Self-pay

## 2022-05-12 ENCOUNTER — Emergency Department (HOSPITAL_COMMUNITY)
Admission: EM | Admit: 2022-05-12 | Discharge: 2022-05-13 | Disposition: A | Discharge status: Home / Self Care | Payer: Medicare Other | Attending: Emergency Medicine | Admitting: Emergency Medicine

## 2022-05-12 ENCOUNTER — Emergency Department (HOSPITAL_COMMUNITY): Payer: Medicare Other

## 2022-05-12 DIAGNOSIS — G510 Bell's palsy: Secondary | ICD-10-CM | POA: Diagnosis not present

## 2022-05-12 DIAGNOSIS — R42 Dizziness and giddiness: Secondary | ICD-10-CM | POA: Diagnosis present

## 2022-05-12 DIAGNOSIS — R29810 Facial weakness: Secondary | ICD-10-CM | POA: Diagnosis not present

## 2022-05-12 DIAGNOSIS — R519 Headache, unspecified: Secondary | ICD-10-CM | POA: Diagnosis present

## 2022-05-12 DIAGNOSIS — I10 Essential (primary) hypertension: Secondary | ICD-10-CM | POA: Diagnosis not present

## 2022-05-12 DIAGNOSIS — Z7984 Long term (current) use of oral hypoglycemic drugs: Secondary | ICD-10-CM | POA: Insufficient documentation

## 2022-05-12 LAB — CBC WITH DIFFERENTIAL/PLATELET
Abs Immature Granulocytes: 0.07 10*3/uL (ref 0.00–0.07)
Basophils Absolute: 0 10*3/uL (ref 0.0–0.1)
Basophils Relative: 0 %
Eosinophils Absolute: 0.1 10*3/uL (ref 0.0–0.5)
Eosinophils Relative: 1 %
HCT: 40.6 % (ref 39.0–52.0)
Hemoglobin: 13.1 g/dL (ref 13.0–17.0)
Immature Granulocytes: 1 %
Lymphocytes Relative: 16 %
Lymphs Abs: 1.4 10*3/uL (ref 0.7–4.0)
MCH: 28.9 pg (ref 26.0–34.0)
MCHC: 32.3 g/dL (ref 30.0–36.0)
MCV: 89.4 fL (ref 80.0–100.0)
Monocytes Absolute: 0.8 10*3/uL (ref 0.1–1.0)
Monocytes Relative: 10 %
Neutro Abs: 6 10*3/uL (ref 1.7–7.7)
Neutrophils Relative %: 72 %
Platelets: 364 10*3/uL (ref 150–400)
RBC: 4.54 MIL/uL (ref 4.22–5.81)
RDW: 14.4 % (ref 11.5–15.5)
WBC: 8.4 10*3/uL (ref 4.0–10.5)
nRBC: 0 % (ref 0.0–0.2)

## 2022-05-12 LAB — I-STAT CHEM 8, ED
BUN: 9 mg/dL (ref 6–20)
Calcium, Ion: 1.1 mmol/L — ABNORMAL LOW (ref 1.15–1.40)
Chloride: 104 mmol/L (ref 98–111)
Creatinine, Ser: 0.9 mg/dL (ref 0.61–1.24)
Glucose, Bld: 329 mg/dL — ABNORMAL HIGH (ref 70–99)
HCT: 42 % (ref 39.0–52.0)
Hemoglobin: 14.3 g/dL (ref 13.0–17.0)
Potassium: 4 mmol/L (ref 3.5–5.1)
Sodium: 138 mmol/L (ref 135–145)
TCO2: 24 mmol/L (ref 22–32)

## 2022-05-12 LAB — PROTIME-INR
INR: 1 (ref 0.8–1.2)
Prothrombin Time: 13.2 seconds (ref 11.4–15.2)

## 2022-05-12 NOTE — ED Provider Notes (Signed)
[facility] EMERGENCY DEPARTMENT Provider Note   CSN: [id number] Arrival date & time: 05/12/22  1903     History  Chief Complaint  Patient presents with   Facial Droop     Jared [name] is a 56 y.o. male.  Patient presents for evaluation of dizziness in the mornings, slight right-sided headache and right facial droop with difficulty speaking.  Patient reports that the headache and dizziness has been ongoing for more than a week.  He was seen in the ED for this and had a work-up.  He reports that he woke up the morning after he had the stroke work-up in the emergency department with the right facial droop.       Home Medications Prior to Admission medications   Medication Sig Start Date End Date Taking? Authorizing Provider  Adalimumab (HUMIRA PEN) 40 MG/0.4ML PNKT Inject 40 mg into the skin every 14 (fourteen) days. 09/16/20   Thornton Park, MD  atorvastatin (LIPITOR) 10 MG tablet TAKE 1 TABLET BY MOUTH DAILY 07/22/21   Dorena Dew, FNP  Blood Glucose Monitoring Suppl (TRUE METRIX METER) w/Device KIT 1 each by Does not apply route 4 (four) times daily -  before meals and at bedtime. Patient not taking: Reported on 03/30/2022 06/21/19   Lanae Boast, FNP  budesonide (ENTOCORT EC) 3 MG 24 hr capsule TAKE 3 CAPSULES(9MG TOTAL)BY MOUTH DAILY 07/02/21   Thornton Park, MD  buPROPion (WELLBUTRIN XL) 150 MG 24 hr tablet Take 1 tablet (150 mg total) by mouth daily. 03/30/22   Dorena Dew, FNP  chlorhexidine (PERIDEX) 0.12 % solution Use as directed 15 mLs in the mouth or throat 2 (two) times daily. 06/22/21   Fatima Blank, MD  gabapentin (NEURONTIN) 100 MG capsule Take 1 capsule (100 mg total) by mouth 3 (three) times daily. 03/30/22   Dorena Dew, FNP  glucose blood (TRUE METRIX BLOOD GLUCOSE TEST) test strip Use as instructed Patient not taking: Reported on 03/30/2022 07/06/17   Dorena Dew, FNP  hydrOXYzine (ATARAX) 10 MG tablet TAKE 1  TABLET (10 MG TOTAL) BY MOUTH 3 (THREE) TIMES DAILY AS NEEDED. 03/30/22 03/30/23  Dorena Dew, FNP  lidocaine (LIDODERM) 5 % Place 1 patch onto the skin daily. Remove & Discard patch within 12 hours or as directed by MD 02/22/22   Henderly, Britni A, PA-C  lidocaine (XYLOCAINE) 5 % ointment lidocaine 5 % topical ointment-transparent dressing 6 cm X 7 cm kit 11/07/21   [provider]  lisinopril (ZESTRIL) 20 MG tablet TAKE 1 TABLET BY MOUTH DAILY 07/22/21   Dorena Dew, FNP  meclizine (ANTIVERT) 25 MG tablet Take 1 tablet (25 mg total) by mouth 3 (three) times daily as needed for dizziness. 05/03/22   Deno Etienne, DO  metFORMIN (GLUCOPHAGE) 1000 MG tablet TAKE ONE (1) TABLET BY MOUTH TWICE DAILY WITH A MEAL 07/22/21   Dorena Dew, FNP  omega-3 acid ethyl esters (LOVAZA) 1 g capsule Take 2 g by mouth 2 (two) times daily. 01/15/21   [provider]  TRADJENTA 5 MG TABS tablet TAKE 1 TABLET BY MOUTH DAILY 12/17/21   Dorena Dew, FNP  furosemide (LASIX) 10 MG/ML solution Take by mouth daily.  01/20/12  [provider]  mesalamine (PENTASA) 250 MG CR capsule Take 1,000 mg by mouth 4 (four) times daily.  01/20/12  [provider]  ONGLYZA 5 MG TABS tablet TAKE 1 TABLET (5 MG TOTAL) BY MOUTH DAILY. Patient not  taking: Reported on 06/03/2020 06/02/20 06/03/20  Dorena Dew, FNP  pioglitazone (ACTOS) 15 MG tablet Take by mouth daily.  01/20/12  [provider]      Allergies    Trazodone and nefazodone    Review of Systems   Review of Systems  Physical Exam Updated Vital Signs BP 138/76   Pulse 74   Temp 98.7 F (37.1 C) (Oral)   Resp 13   SpO2 99%  Physical Exam Vitals and nursing note reviewed.  Constitutional:      General: He is not in acute distress.    Appearance: He is well-developed.  HENT:     Head: Normocephalic and atraumatic.     Mouth/Throat:     Mouth: Mucous membranes are moist.  Eyes:     General: Vision grossly intact.  Gaze aligned appropriately.     Extraocular Movements: Extraocular movements intact.     Conjunctiva/sclera: Conjunctivae normal.  Cardiovascular:     Rate and Rhythm: Normal rate and regular rhythm.     Pulses: Normal pulses.     Heart sounds: Normal heart sounds, S1 normal and S2 normal. No murmur heard.    No friction rub. No gallop.  Pulmonary:     Effort: Pulmonary effort is normal. No respiratory distress.     Breath sounds: Normal breath sounds.  Abdominal:     Palpations: Abdomen is soft.     Tenderness: There is no abdominal tenderness. There is no guarding or rebound.     Hernia: No hernia is present.  Musculoskeletal:        General: No swelling.     Cervical back: Full passive range of motion without pain, normal range of motion and neck supple. No pain with movement, spinous process tenderness or muscular tenderness. Normal range of motion.     Right lower leg: No edema.     Left lower leg: No edema.  Skin:    General: Skin is warm and dry.     Capillary Refill: Capillary refill takes less than 2 seconds.     Findings: No ecchymosis, erythema, lesion or wound.  Neurological:     Mental Status: He is alert and oriented to person, place, and time.     GCS: GCS eye subscore is 4. GCS verbal subscore is 5. GCS motor subscore is 6.     Cranial Nerves: Facial asymmetry (right Facial nerve palsy (both upper and lower motor neuron)) present.     Sensory: Sensation is intact.     Motor: Motor function is intact. No weakness or abnormal muscle tone.     Coordination: Coordination is intact.  Psychiatric:        Mood and Affect: Mood normal.        Speech: Speech normal.        Behavior: Behavior normal.     ED Results / Procedures / Treatments   Labs (all labs ordered are listed, but only abnormal results are displayed) Labs Reviewed  I-STAT CHEM 8, ED - Abnormal; Notable for the following components:      Result Value   Glucose, Bld 329 (*)    Calcium, Ion 1.10 (*)     All other components within normal limits  CBC WITH DIFFERENTIAL/PLATELET  PROTIME-INR    EKG EKG Interpretation  Date/Time:  Wednesday May 12 2022 19:34:56 EDT Ventricular Rate:  95 PR Interval:  138 QRS Duration: 92 QT Interval:  350 QTC Calculation: 439 R Axis:   106 Text Interpretation:  Normal sinus rhythm Rightward axis Inferior infarct , age undetermined Abnormal ECG No significant change since last tracing Confirmed by Orpah Greek [phone]) on 05/12/2022 11:43:12 PM  Radiology CT Head Wo Contrast  Result Date: 05/12/2022 CLINICAL DATA:  Neuro deficit, acute, stroke suspected. Facial droop EXAM: CT HEAD WITHOUT CONTRAST TECHNIQUE: Contiguous axial images were obtained from the base of the skull through the vertex without intravenous contrast. RADIATION DOSE REDUCTION: This exam was performed according to the departmental dose-optimization program which includes automated exposure control, adjustment of the mA and/or kV according to patient size and/or use of iterative reconstruction technique. COMPARISON:  05/03/2022 CT and MRI FINDINGS: Brain: No acute intracranial abnormality. Specifically, no hemorrhage, hydrocephalus, mass lesion, acute infarction, or significant intracranial injury. Vascular: No hyperdense vessel or unexpected calcification. Skull: No acute calvarial abnormality. Sinuses/Orbits: No acute findings Other: None IMPRESSION: Normal study. Electronically Signed   By: Rolm Baptise M.D.   On: 05/12/2022 20:23    Procedures Procedures    Medications Ordered in ED Medications - No data to display  ED Course/ Medical Decision Making/ A&P                           Medical Decision Making  Patient presents with multiple complaints.  Patient reports intermittent, slight nagging headache behind the right ear associated with dizziness.  This has been ongoing for a while.  Patient was seen 10 days ago for the symptoms and had full work-up including CT head and  MRI brain that were normal.  Patient reports that he woke up the next day and noticed a right facial droop.  This would have been present for more than a week.  Examination is consistent with a Bell's palsy.  Patient has right upper and lower motor neuron affected without any other neurologic findings on exam.  No extremity weakness.  Speech is slightly slurred because of the facial droop but no aphasia.  He does not require repeat MRI, diagnosis can be made by examination alone.  Based on the timing, symptoms present for more than a week, it does not seem reasonable that the patient would respond to steroids or antiviral therapy.  Bell's palsy is likely directly secondary to his poorly controlled diabetes.  Adding prednisone to his regimen would likely worsen this and put him at risk for DKA, with very little benefit.  Discussed briefly with on-call neurologist, Dr. Rory Percy who agrees with no treatment at this time.        Final Clinical Impression(s) / ED Diagnoses Final diagnoses:  Bell's palsy    Rx / DC Orders ED Discharge Orders          Ordered    Ambulatory referral to Neurology       Comments: An appointment is requested in approximately: 2 weeks   05/13/22 0133              Orpah Greek, MD 05/13/22 [id number]

## 2022-05-12 NOTE — ED Provider Notes (Incomplete)
[facility] EMERGENCY DEPARTMENT Provider Note   CSN: [id number] Arrival date & time: 05/12/22  1903     History {Add pertinent medical, surgical, social history, OB history to HPI:1} Chief Complaint  Patient presents with  . Facial Droop     Jared [name] is a 56 y.o. male.  Patient presents for evaluation of dizziness in the mornings, slight right-sided headache and right facial droop with difficulty speaking.  Patient reports that the headache and dizziness has been ongoing for more than a week.  He was seen in the ED for this and had a work-up.  He reports that he woke up the morning after he had the stroke work-up in the emergency department with the right facial droop.       Home Medications Prior to Admission medications   Medication Sig Start Date End Date Taking? Authorizing Provider  Adalimumab (HUMIRA PEN) 40 MG/0.4ML PNKT Inject 40 mg into the skin every 14 (fourteen) days. 09/16/20   Thornton Park, MD  atorvastatin (LIPITOR) 10 MG tablet TAKE 1 TABLET BY MOUTH DAILY 07/22/21   Dorena Dew, FNP  Blood Glucose Monitoring Suppl (TRUE METRIX METER) w/Device KIT 1 each by Does not apply route 4 (four) times daily -  before meals and at bedtime. Patient not taking: Reported on 03/30/2022 06/21/19   Lanae Boast, FNP  budesonide (ENTOCORT EC) 3 MG 24 hr capsule TAKE 3 CAPSULES(9MG TOTAL)BY MOUTH DAILY 07/02/21   Thornton Park, MD  buPROPion (WELLBUTRIN XL) 150 MG 24 hr tablet Take 1 tablet (150 mg total) by mouth daily. 03/30/22   Dorena Dew, FNP  chlorhexidine (PERIDEX) 0.12 % solution Use as directed 15 mLs in the mouth or throat 2 (two) times daily. 06/22/21   Fatima Blank, MD  gabapentin (NEURONTIN) 100 MG capsule Take 1 capsule (100 mg total) by mouth 3 (three) times daily. 03/30/22   Dorena Dew, FNP  glucose blood (TRUE METRIX BLOOD GLUCOSE TEST) test strip Use as instructed Patient not taking: Reported on 03/30/2022 07/06/17    Dorena Dew, FNP  hydrOXYzine (ATARAX) 10 MG tablet TAKE 1 TABLET (10 MG TOTAL) BY MOUTH 3 (THREE) TIMES DAILY AS NEEDED. 03/30/22 03/30/23  Dorena Dew, FNP  lidocaine (LIDODERM) 5 % Place 1 patch onto the skin daily. Remove & Discard patch within 12 hours or as directed by MD 02/22/22   Henderly, Britni A, PA-C  lidocaine (XYLOCAINE) 5 % ointment lidocaine 5 % topical ointment-transparent dressing 6 cm X 7 cm kit 11/07/21   [provider]  lisinopril (ZESTRIL) 20 MG tablet TAKE 1 TABLET BY MOUTH DAILY 07/22/21   Dorena Dew, FNP  meclizine (ANTIVERT) 25 MG tablet Take 1 tablet (25 mg total) by mouth 3 (three) times daily as needed for dizziness. 05/03/22   Deno Etienne, DO  metFORMIN (GLUCOPHAGE) 1000 MG tablet TAKE ONE (1) TABLET BY MOUTH TWICE DAILY WITH A MEAL 07/22/21   Dorena Dew, FNP  omega-3 acid ethyl esters (LOVAZA) 1 g capsule Take 2 g by mouth 2 (two) times daily. 01/15/21   [provider]  TRADJENTA 5 MG TABS tablet TAKE 1 TABLET BY MOUTH DAILY 12/17/21   Dorena Dew, FNP  furosemide (LASIX) 10 MG/ML solution Take by mouth daily.  01/20/12  [provider]  mesalamine (PENTASA) 250 MG CR capsule Take 1,000 mg by mouth 4 (four) times daily.  01/20/12  [provider]  ONGLYZA 5 MG TABS tablet TAKE 1  TABLET (5 MG TOTAL) BY MOUTH DAILY. Patient not taking: Reported on 06/03/2020 06/02/20 06/03/20  Dorena Dew, FNP  pioglitazone (ACTOS) 15 MG tablet Take by mouth daily.  01/20/12  [provider]      Allergies    Trazodone and nefazodone    Review of Systems   Review of Systems  Physical Exam Updated Vital Signs BP 123/68   Pulse 78   Temp 98.7 F (37.1 C) (Oral)   Resp 16   SpO2 99%  Physical Exam Vitals and nursing note reviewed.  Constitutional:      General: He is not in acute distress.    Appearance: He is well-developed.  HENT:     Head: Normocephalic and atraumatic.     Mouth/Throat:     Mouth:  Mucous membranes are moist.  Eyes:     General: Vision grossly intact. Gaze aligned appropriately.     Extraocular Movements: Extraocular movements intact.     Conjunctiva/sclera: Conjunctivae normal.  Cardiovascular:     Rate and Rhythm: Normal rate and regular rhythm.     Pulses: Normal pulses.     Heart sounds: Normal heart sounds, S1 normal and S2 normal. No murmur heard.    No friction rub. No gallop.  Pulmonary:     Effort: Pulmonary effort is normal. No respiratory distress.     Breath sounds: Normal breath sounds.  Abdominal:     Palpations: Abdomen is soft.     Tenderness: There is no abdominal tenderness. There is no guarding or rebound.     Hernia: No hernia is present.  Musculoskeletal:        General: No swelling.     Cervical back: Full passive range of motion without pain, normal range of motion and neck supple. No pain with movement, spinous process tenderness or muscular tenderness. Normal range of motion.     Right lower leg: No edema.     Left lower leg: No edema.  Skin:    General: Skin is warm and dry.     Capillary Refill: Capillary refill takes less than 2 seconds.     Findings: No ecchymosis, erythema, lesion or wound.  Neurological:     Mental Status: He is alert and oriented to person, place, and time.     GCS: GCS eye subscore is 4. GCS verbal subscore is 5. GCS motor subscore is 6.     Cranial Nerves: Facial asymmetry (right Facial nerve palsy (both upper and lower motor neuron)) present.     Sensory: Sensation is intact.     Motor: Motor function is intact. No weakness or abnormal muscle tone.     Coordination: Coordination is intact.  Psychiatric:        Mood and Affect: Mood normal.        Speech: Speech normal.        Behavior: Behavior normal.     ED Results / Procedures / Treatments   Labs (all labs ordered are listed, but only abnormal results are displayed) Labs Reviewed  I-STAT CHEM 8, ED - Abnormal; Notable for the following  components:      Result Value   Glucose, Bld 329 (*)    Calcium, Ion 1.10 (*)    All other components within normal limits  CBC WITH DIFFERENTIAL/PLATELET  PROTIME-INR    EKG EKG Interpretation  Date/Time:  Wednesday May 12 2022 19:34:56 EDT Ventricular Rate:  95 PR Interval:  138 QRS Duration: 92 QT Interval:  350 QTC  Calculation: 439 R Axis:   106 Text Interpretation: Normal sinus rhythm Rightward axis Inferior infarct , age undetermined Abnormal ECG No significant change since last tracing Confirmed by Orpah Greek [phone]) on 05/12/2022 11:43:12 PM  Radiology CT Head Wo Contrast  Result Date: 05/12/2022 CLINICAL DATA:  Neuro deficit, acute, stroke suspected. Facial droop EXAM: CT HEAD WITHOUT CONTRAST TECHNIQUE: Contiguous axial images were obtained from the base of the skull through the vertex without intravenous contrast. RADIATION DOSE REDUCTION: This exam was performed according to the departmental dose-optimization program which includes automated exposure control, adjustment of the mA and/or kV according to patient size and/or use of iterative reconstruction technique. COMPARISON:  05/03/2022 CT and MRI FINDINGS: Brain: No acute intracranial abnormality. Specifically, no hemorrhage, hydrocephalus, mass lesion, acute infarction, or significant intracranial injury. Vascular: No hyperdense vessel or unexpected calcification. Skull: No acute calvarial abnormality. Sinuses/Orbits: No acute findings Other: None IMPRESSION: Normal study. Electronically Signed   By: Rolm Baptise M.D.   On: 05/12/2022 20:23    Procedures Procedures  {Document cardiac monitor, telemetry assessment procedure when appropriate:1}  Medications Ordered in ED Medications - No data to display  ED Course/ Medical Decision Making/ A&P                           Medical Decision Making  ***  {Document critical care time when appropriate:1} {Document review of labs and clinical decision tools ie  heart score, Chads2Vasc2 etc:1}  {Document your independent review of radiology images, and any outside records:1} {Document your discussion with family members, caretakers, and with consultants:1} {Document social determinants of health affecting pt's care:1} {Document your decision making why or why not admission, treatments were needed:1} Final Clinical Impression(s) / ED Diagnoses Final diagnoses:  None    Rx / DC Orders ED Discharge Orders     None

## 2022-05-12 NOTE — ED Provider Triage Note (Signed)
Emergency Medicine Provider Triage Evaluation Note  Jared [name] , a 56 y.o. male  was evaluated in triage.  Pt complains of facial droop. Report R side facial droop x 3 days, with dizziness, incoordination and mild headache.  Report being seen for dizziness and had stroke work up on Monday, which was negative.  Went home and went to sleep, woke up with facial droop  Review of Systems  Positive: As above Negative: As above  Physical Exam  BP (!) 134/93   Pulse 93   Temp 98.7 F (37.1 C) (Oral)   Resp 16   SpO2 100%  Gen:   Awake, no distress   Resp:  Normal effort  MSK:   Moves extremities without difficulty  Other:  R side facial droop, involving forehead, normal grip strength  Medical Decision Making  Medically screening exam initiated at 7:35 PM.  Appropriate orders placed.  Jared [name] was informed that the remainder of the evaluation will be completed by another provider, this initial triage assessment does not replace that evaluation, and the importance of remaining in the ED until their evaluation is complete.  Suspect Bell's palsy   Domenic Moras, PA-C 05/12/22 1940

## 2022-05-12 NOTE — ED Triage Notes (Signed)
Patient reports right facial droop onset Monday this week at 6 pm with mild headache . Speech clear with no arm drift /equal strong grips .

## 2022-05-13 NOTE — ED Notes (Signed)
Pt verbalized understanding of d/c instructions, meds, and followup care. Denies questions. VSS, no distress noted. Steady gait to exit with all belongings.  ?

## 2022-05-16 ENCOUNTER — Other Ambulatory Visit: Payer: Self-pay | Admitting: Family Medicine

## 2022-05-16 DIAGNOSIS — E1140 Type 2 diabetes mellitus with diabetic neuropathy, unspecified: Secondary | ICD-10-CM

## 2022-05-16 DIAGNOSIS — I10 Essential (primary) hypertension: Secondary | ICD-10-CM

## 2022-06-03 ENCOUNTER — Ambulatory Visit (INDEPENDENT_AMBULATORY_CARE_PROVIDER_SITE_OTHER): Payer: Medicare Other | Admitting: Neurology

## 2022-06-03 ENCOUNTER — Encounter: Payer: Self-pay | Admitting: Neurology

## 2022-06-03 VITALS — BP 144/81 | HR 75 | Ht 74.0 in | Wt 207.0 lb

## 2022-06-03 DIAGNOSIS — G510 Bell's palsy: Secondary | ICD-10-CM

## 2022-06-03 DIAGNOSIS — R42 Dizziness and giddiness: Secondary | ICD-10-CM | POA: Diagnosis not present

## 2022-06-03 NOTE — Patient Instructions (Addendum)
Increase fluid intake  Continue your other medications  Continue with meclizine as needed for dizziness Follow up with PCP  Return as needed

## 2022-06-03 NOTE — Progress Notes (Signed)
[facility]  PATIENT: Jared [name] DOB: 09-11-1966  REQUESTING CLINICIAN: Orpah Greek,* HISTORY FROM: Patient  REASON FOR VISIT: Right sided Bell's Palsy    HISTORICAL  CHIEF COMPLAINT:  Chief Complaint  Patient presents with   New Patient (Initial Visit)    Room 12 , alone  NP/ED referral for Bells Palsy C/o right eye and check numbness     HISTORY OF PRESENT ILLNESS:  This is a 56 year old gentleman past medical history of diabetes mellitus, hypertension and Crohn's disease who is presenting with right-sided Bell's palsy. Patient reports on June 19 he presented to the emergency department because he woke up with severe dizziness.  He also noted 2 days prior to the dizziness he had right ear pain.  In the ED, he had a normal head CT and MRI, diagnosed with dizziness and given meclizine.  Patient reports taking the meclizine for 2 days which make the dizziness worse, therefore he discontinued it.  On June 28 he presented to the ED because of right facial droop. At that time he was diagnosed with Bell's palsy.  Due to the fact that his symptoms were more than 10 days he was not started on antiviral or steroids. Currently he reports his right-sided facial weakness improved, he is able to close his eyes, smile is better but he still have very mild weakness on the right lower lip. Denies any previous history of Bell's palsy.  He still complains of occasional dizziness, but recognizes that he does not drink enough fluid. Recently, he has increase his water intake.  OTHER MEDICAL CONDITIONS: Chron's disease, Diabetes Mellitus, Hypertension,    REVIEW OF SYSTEMS: Full 14 system review of systems performed and negative with exception of: As noted in the HPI   ALLERGIES: Allergies  Allergen Reactions   Trazodone And Nefazodone     hallucinations    HOME MEDICATIONS: Outpatient Medications Prior to Visit  Medication Sig Dispense Refill   Adalimumab  (HUMIRA PEN) 40 MG/0.4ML PNKT Inject 40 mg into the skin every 14 (fourteen) days. 2 each 11   atorvastatin (LIPITOR) 10 MG tablet TAKE 1 TABLET BY MOUTH DAILY 30 tablet 10   Blood Glucose Monitoring Suppl (TRUE METRIX METER) w/Device KIT 1 each by Does not apply route 4 (four) times daily -  before meals and at bedtime. 1 kit 0   glucose blood (TRUE METRIX BLOOD GLUCOSE TEST) test strip Use as instructed 100 each 12   hydrOXYzine (ATARAX) 10 MG tablet TAKE 1 TABLET (10 MG TOTAL) BY MOUTH 3 (THREE) TIMES DAILY AS NEEDED. 30 tablet 5   lidocaine (LIDODERM) 5 % Place 1 patch onto the skin daily. Remove & Discard patch within 12 hours or as directed by MD 30 patch 0   lidocaine (XYLOCAINE) 5 % ointment lidocaine 5 % topical ointment-transparent dressing 6 cm X 7 cm kit     lisinopril (ZESTRIL) 20 MG tablet TAKE 1 TABLET BY MOUTH DAILY 30 tablet 10   metFORMIN (GLUCOPHAGE) 1000 MG tablet TAKE 1 TABLET BY MOUTH TWICE DAILY WITH A MEAL 60 tablet 10   omega-3 acid ethyl esters (LOVAZA) 1 g capsule Take 2 g by mouth 2 (two) times daily.     TRADJENTA 5 MG TABS tablet TAKE 1 TABLET BY MOUTH DAILY 30 tablet 10   budesonide (ENTOCORT EC) 3 MG 24 hr capsule TAKE 3 CAPSULES(9MG TOTAL)BY MOUTH DAILY 90 capsule 3   buPROPion (WELLBUTRIN XL) 150 MG 24 hr tablet Take 1 tablet (150  mg total) by mouth daily. 30 tablet 2   chlorhexidine (PERIDEX) 0.12 % solution Use as directed 15 mLs in the mouth or throat 2 (two) times daily. 120 mL 0   gabapentin (NEURONTIN) 100 MG capsule Take 1 capsule (100 mg total) by mouth 3 (three) times daily. 90 capsule 1   meclizine (ANTIVERT) 25 MG tablet Take 1 tablet (25 mg total) by mouth 3 (three) times daily as needed for dizziness. 30 tablet 0   No facility-administered medications prior to visit.    PAST MEDICAL HISTORY: Past Medical History:  Diagnosis Date   Crohn disease ([place]) 2008   with hospital admission in 2011, and 8/12 for flare up and SBO relieved with bowel rest.  no suregery, no meds for CD   Diabetes mellitus type II    Hypertension     PAST SURGICAL HISTORY: Past Surgical History:  Procedure Laterality Date   BOWEL RESECTION  01/25/2012   Procedure: SMALL BOWEL RESECTION;  Surgeon: [name], MD;  Location: [facility];  Service: General;  Laterality: N/A;   FINGER AMPUTATION  ~ 2000   partial; right" pointer"   LAPAROTOMY  01/25/2012   Procedure: EXPLORATORY LAPAROTOMY;  Surgeon: [name], MD;  Location: [facility];  Service: General;  Laterality: N/A;   SCROTAL SURGERY  ~ 90   "took out a pellet"    FAMILY HISTORY: Family History  Problem Relation Age of Onset   Cancer Mother        lung   Diabetes Mother    Alopecia Mother    Coronary artery disease Mother    Diabetes Sister    Hyperlipidemia Sister    Hypertension Sister    Angina Brother    Hepatitis Brother    Alcohol abuse Brother    Diabetes Brother    Colon cancer Paternal Grandmother    Esophageal cancer Neg Hx    Stomach cancer Neg Hx    Rectal cancer Neg Hx     SOCIAL HISTORY: Social History   Socioeconomic History   Marital status: Single    Spouse name: Not on file   Number of children: 0   Years of education: Not on file   Highest education level: Not on file  Occupational History   Not on file  Tobacco Use   Smoking status: Every Day    Packs/day: 0.25    Years: 30.00    Total pack years: 7.50    Types: Cigarettes   Smokeless tobacco: Former    Types: Chew    Quit date: 01/20/1997  Vaping Use   Vaping Use: Never used  Substance and Sexual Activity   Alcohol use: Not Currently   Drug use: No   Sexual activity: Not Currently  Other Topics Concern   Not on file  Social History Narrative   Lives in [place].Is single. Has a sister in [place]. He has a twin brother that passed away from autoimmune hepatitis.  Smokes 2-3 cigarettes a day. Drinks beer once every few months. No history of drug abuse. Studied up to 12th grade. Has orange  card. No health insurance. Currently employed cleaning buildings.   Social Determinants of Health   Financial Resource Strain: Not on file  Food Insecurity: Not on file  Transportation Needs: Not on file  Physical Activity: Not on file  Stress: Not on file  Social Connections: Not on file  Intimate Partner Violence: Not on file    PHYSICAL EXAM  GENERAL EXAM/CONSTITUTIONAL: Vitals:  Vitals:   06/03/22 0840  BP: (!) 144/81  Pulse: 75  Weight: 207 lb (93.9 kg)  Height: _0  (1.88 m)   Body mass index is 26.58 kg/m. Wt Readings from Last 3 Encounters:  06/03/22 207 lb (93.9 kg)  05/03/22 210 lb (95.3 kg)  03/30/22 207 lb 3.2 oz (94 kg)   Patient is in no distress; well developed, nourished and groomed; neck is supple   EYES: Pupils round and reactive to light, Visual fields full to confrontation, Extraocular movements intacts,   MUSCULOSKELETAL: Gait, strength, tone, movements noted in Neurologic exam below  NEUROLOGIC: MENTAL STATUS:      No data to display         awake, alert, oriented to person, place and time recent and remote memory intact normal attention and concentration language fluent, comprehension intact, naming intact fund of knowledge appropriate  CRANIAL NERVE:  2nd, 3rd, 4th, 6th - pupils equal and reactive to light, visual fields full to confrontation, extraocular muscles intact, no nystagmus 5th - facial sensation symmetric 7th - Mild upper and lower facial weakness, ale to close fully right eye, there is still mild right lower facial droop. Smile is asymmetric 8th - hearing intact 9th - palate elevates symmetrically, uvula midline 11th - shoulder shrug symmetric 12th - tongue protrusion midline  MOTOR:  normal bulk and tone, full strength in the BUE, BLE  SENSORY:  normal and symmetric to light touch, pinprick, temperature, vibration  COORDINATION:  finger-nose-finger, fine finger movements normal  REFLEXES:  deep tendon  reflexes present and symmetric  GAIT/STATION:  normal   DIAGNOSTIC DATA (LABS, IMAGING, TESTING) - I reviewed patient records, labs, notes, testing and imaging myself where available.  Lab Results  Component Value Date   WBC 8.4 05/12/2022   HGB 14.3 05/12/2022   HCT 42.0 05/12/2022   MCV 89.4 05/12/2022   PLT 364 05/12/2022      Component Value Date/Time   NA 138 05/12/2022 2130   NA 142 03/30/2022 1123   K 4.0 05/12/2022 2130   CL 104 05/12/2022 2130   CO2 27 05/03/2022 0538   GLUCOSE 329 (H) 05/12/2022 2130   BUN 9 05/12/2022 2130   BUN 14 03/30/2022 1123   CREATININE 0.90 05/12/2022 2130   CREATININE 0.98 07/06/2017 0937   CALCIUM 9.1 05/03/2022 0538   PROT 6.9 03/30/2022 1123   ALBUMIN 4.4 03/30/2022 1123   AST 19 03/30/2022 1123   ALT 23 03/30/2022 1123   ALKPHOS 79 03/30/2022 1123   BILITOT 0.4 03/30/2022 1123   GFRNONAA >60 05/03/2022 0538   GFRNONAA 89 07/06/2017 0937   GFRAA 96 09/02/2020 1031   GFRAA >89 07/06/2017 0937   Lab Results  Component Value Date   CHOL 113 12/29/2021   HDL 43 12/29/2021   LDLCALC 55 12/29/2021   TRIG 74 12/29/2021   CHOLHDL 2.6 12/29/2021   Lab Results  Component Value Date   HGBA1C 9.3 (A) 03/30/2022   HGBA1C 9.3 03/30/2022   HGBA1C 9.3 (A) 03/30/2022   HGBA1C 9.3 (A) 03/30/2022   No results found for: "VITAMINB12" No results found for: "TSH"  MRI Head 05/03/22 No acute infarction. No other significant intracranial abnormality.    ASSESSMENT AND PLAN  56 y.o. year old male with history of Crohn disease, diabetes and hypertension who is presenting with right facial weakness facial weakness diagnosed with Bell's palsy.  Bell's palsy.  Patient has a diagnosis 10 days after the initial presentation therefore was not started on  antiviral and steroid given he has diabetes mellitus.  On exam on exam there is improvement of the right facial weakness facial weakness even though he still facial weakness even though he still  have very mild right lower.right lower facial droop. I informed patient that likely likely his Bell's palsy will likely  improve even without medication. I have also encourage him to increase his water intake and to use Meclizine as needed. He voices understanding. Continue to follow up with PCP and return as needed.     1. Bell's palsy   2. Dizziness      Patient Instructions  Increase fluid intake  Continue your other medications  Follow up with PCP  Return as needed    No orders of the defined types were placed in this encounter.   No orders of the defined types were placed in this encounter.   Return if symptoms worsen or fail to improve.   I have spent a total of 45 minutes dedicated to this patient today, preparing to see patient, performing a medically appropriate examination and evaluation, ordering tests and/or medications and procedures, and counseling and educating the patient/family/caregiver; independently interpreting result and communicating results to the family/patient/caregiver; and documenting clinical information in the electronic medical record.   Alric Ran, MD 06/03/2022, 2:14 PM  [facility] [address] [place], [place] [address] [phone]

## 2022-07-06 ENCOUNTER — Ambulatory Visit: Payer: Medicare Other | Admitting: Family Medicine

## 2022-07-13 ENCOUNTER — Ambulatory Visit: Payer: Medicare Other | Admitting: Gastroenterology

## 2022-07-20 ENCOUNTER — Encounter: Payer: Self-pay | Admitting: Family Medicine

## 2022-07-20 ENCOUNTER — Ambulatory Visit (INDEPENDENT_AMBULATORY_CARE_PROVIDER_SITE_OTHER): Payer: Medicare Other | Admitting: Family Medicine

## 2022-07-20 VITALS — BP 139/74 | HR 77 | Temp 97.5°F | Ht 74.0 in | Wt 206.0 lb

## 2022-07-20 DIAGNOSIS — F17200 Nicotine dependence, unspecified, uncomplicated: Secondary | ICD-10-CM

## 2022-07-20 DIAGNOSIS — E1140 Type 2 diabetes mellitus with diabetic neuropathy, unspecified: Secondary | ICD-10-CM

## 2022-07-20 DIAGNOSIS — I10 Essential (primary) hypertension: Secondary | ICD-10-CM

## 2022-07-20 DIAGNOSIS — E785 Hyperlipidemia, unspecified: Secondary | ICD-10-CM | POA: Diagnosis not present

## 2022-07-20 LAB — POCT URINALYSIS DIP (CLINITEK)
Bilirubin, UA: NEGATIVE
Blood, UA: NEGATIVE
Glucose, UA: 500 mg/dL — AB
Ketones, POC UA: NEGATIVE mg/dL
Leukocytes, UA: NEGATIVE
Nitrite, UA: NEGATIVE
POC PROTEIN,UA: NEGATIVE
Spec Grav, UA: 1.025 (ref 1.010–1.025)
Urobilinogen, UA: 0.2 E.U./dL
pH, UA: 5.5 (ref 5.0–8.0)

## 2022-07-20 LAB — POCT GLYCOSYLATED HEMOGLOBIN (HGB A1C)
HbA1c POC (<> result, manual entry): 7.6 % (ref 4.0–5.6)
HbA1c, POC (controlled diabetic range): 7.6 % — AB (ref 0.0–7.0)
HbA1c, POC (prediabetic range): 7.6 % — AB (ref 5.7–6.4)
Hemoglobin A1C: 7.6 % — AB (ref 4.0–5.6)

## 2022-07-20 NOTE — Addendum Note (Signed)
Addended by: Schuyler Amor on: 07/20/2022 12:32 PM   Modules accepted: Orders

## 2022-07-20 NOTE — Progress Notes (Signed)
Patient Jared [name]   Established Patient Office Visit  Subjective   Patient ID: Jared [name], male    DOB: 1966/07/01  Age: 56 y.o. MRN: [id number]  Chief Complaint  Patient presents with   Follow-up    Pt is here for  follow up visit. Pt states he had bell's palsy     Jared [name] is a very pleasant 56 year old male with a medical history significant for type 2 diabetes mellitus, chronic bilateral shoulder pain, history of Crohn's disease, hyperlipidemia, and essential hypertension presents for follow-up of chronic conditions.  Patient states that he has been doing fairly well over the past several weeks.  He was recently evaluated in the emergency department for an episode of Bell's palsy, which is improved.  Patient states that he has been evaluated by a specialist for this problem. Jared [name] has a history of type 2 diabetes mellitus.  Diabetes mellitus has been poorly controlled over the past 6 months.  His most recent hemoglobin A1c was 9.3.  He states that he has improved his diet and decrease the amount of chicken with skin.  Patient also says that he has increased his vegetable intake.  He denies any polydipsia, polyuria, or polyphagia.  No blurred vision or dizziness. Patient does not check blood glucose at home. He also has a history of essential hypertension and hyperlipidemia.  Patient does not check his blood pressure at home.  He has been taking all medications consistently.  He is not followed by cardiology.  Blood pressure has been well controlled over the past several years.  He denies any chest pain, shortness of breath, or lower extremity swelling.    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[place]) 03/30/2022   Degenerative joint disease of  shoulder region 03/18/2022   Ileus ([place]) 02/21/2020   Acute Crohn's disease with intestinal obstruction ([place]) 02/20/2020   SBO (small bowel obstruction) ([place])    Chest pain 11/05/2018   HTN (hypertension) 11/05/2018   Injury of vertebral artery 08/30/2017   Nondisplaced posterior arch fracture of first cervical vertebra with routine healing 08/30/2017   Cervical spine fracture ([place]) 07/28/2017   C2 cervical fracture ([place]) 07/28/2017   Dyslipidemia 09/06/2014   Left facial swelling 09/06/2014   Dental caries 09/06/2014   Crohn's disease ([place]) 06/30/2014   Tobacco user 06/30/2014   Diabetes mellitus type II, non insulin dependent ([place]) 06/30/2014   Crohn disease ([employer]) 05/10/2014   Intra-abdominal abscess ([place]) 02/13/2012   S/P exploratory laparotomy with ileocecectomy 02/13/2012   Hypokalemia 02/13/2012   Hypomagnesemia 02/13/2012   CD (Crohn's disease) ([place]) 01/20/2012   DM (diabetes mellitus) ([place]) 01/20/2012   Past Medical History:  Diagnosis Date   Crohn disease ([place]) 2008   with hospital admission in 2011, and 8/12 for flare up and SBO relieved with bowel rest. no suregery, no meds for CD   Diabetes mellitus type II    Hypertension    Past Surgical History:  Procedure Laterality Date   BOWEL RESECTION  01/25/2012   Procedure: SMALL BOWEL RESECTION;  Surgeon: Jared Faster. Cornett, MD;  Location: [facility];  Service: General;  Laterality: N/A;   FINGER AMPUTATION  ~ 2000   partial; right" pointer"   LAPAROTOMY  01/25/2012   Procedure: EXPLORATORY LAPAROTOMY;  Surgeon: Jared Moores  Nydia Bouton, MD;  Location: [facility];  Service: General;  Laterality: N/A;   SCROTAL SURGERY  ~ 93   "took out a pellet"   Social History   Tobacco Use   Smoking status: Every Day    Packs/day: 0.25    Years: 30.00    Total pack years: 7.50    Types: Cigarettes   Smokeless tobacco: Former    Types: Chew    Quit date: 01/20/1997  Vaping Use   Vaping Use: Never used  Substance Use Topics   Alcohol use:  Not Currently   Drug use: No   Social History   Socioeconomic History   Marital status: Single    Spouse name: Not on file   Number of children: 0   Years of education: Not on file   Highest education level: Not on file  Occupational History   Not on file  Tobacco Use   Smoking status: Every Day    Packs/day: 0.25    Years: 30.00    Total pack years: 7.50    Types: Cigarettes   Smokeless tobacco: Former    Types: Chew    Quit date: 01/20/1997  Vaping Use   Vaping Use: Never used  Substance and Sexual Activity   Alcohol use: Not Currently   Drug use: No   Sexual activity: Not Currently  Other Topics Concern   Not on file  Social History Narrative   Lives in [place].Is single. Has a sister in [place]. He has a twin brother that passed away from autoimmune hepatitis.  Smokes 2-3 cigarettes a day. Drinks beer once every few months. No history of drug abuse. Studied up to 12th grade. Has orange card. No health insurance. Currently employed cleaning buildings.   Social Determinants of Health   Financial Resource Strain: Not on file  Food Insecurity: Not on file  Transportation Needs: Not on file  Physical Activity: Not on file  Stress: Not on file  Social Connections: Not on file  Intimate Partner Violence: Not on file   Family Status  Relation Name Status   Mother  Deceased   Father  Alive   Sister  Alive   Brother  Alive   PGM  (Not Specified)   Neg Hx  (Not Specified)   Family History  Problem Relation Age of Onset   Cancer Mother        lung   Diabetes Mother    Alopecia Mother    Coronary artery disease Mother    Diabetes Sister    Hyperlipidemia Sister    Hypertension Sister    Angina Brother    Hepatitis Brother    Alcohol abuse Brother    Diabetes Brother    Colon cancer Paternal Grandmother    Esophageal cancer Neg Hx    Stomach cancer Neg Hx    Rectal cancer Neg Hx    Allergies  Allergen Reactions   Trazodone And Nefazodone      hallucinations      Review of Systems  Constitutional: Negative.   HENT: Negative.    Eyes: Negative.   Respiratory: Negative.    Cardiovascular: Negative.   Gastrointestinal: Negative.   Genitourinary: Negative.   Musculoskeletal:  Positive for joint pain.  Neurological: Negative.   Psychiatric/Behavioral: Negative.        Objective:     BP 139/74 (BP Location: Right Arm, Patient Position: Sitting, Cuff Size: Large)   Pulse 77   Temp (!) 97.5 F (36.4 C)  Ht 6' 2"  (1.88 m)   Wt 206 lb (93.4 kg)   SpO2 100%   BMI 26.45 kg/m  BP Readings from Last 3 Encounters:  07/20/22 139/74  06/03/22 (!) 144/81  05/13/22 138/76   Wt Readings from Last 3 Encounters:  07/20/22 206 lb (93.4 kg)  06/03/22 207 lb (93.9 kg)  05/03/22 210 lb (95.3 kg)      Physical Exam Constitutional:      Appearance: Normal appearance.  Eyes:     Pupils: Pupils are equal, round, and reactive to light.  Cardiovascular:     Rate and Rhythm: Normal rate and regular rhythm.     Pulses: Normal pulses.  Pulmonary:     Effort: Pulmonary effort is normal.  Abdominal:     General: Bowel sounds are normal.  Musculoskeletal:        General: Normal range of motion.  Skin:    General: Skin is warm.  Neurological:     General: No focal deficit present.     Mental Status: He is alert. Mental status is at baseline.  Psychiatric:        Mood and Affect: Mood normal.        Behavior: Behavior normal.        Thought Content: Thought content normal.        Judgment: Judgment normal.      Results for orders placed or performed in visit on 07/20/22  POCT glycosylated hemoglobin (Hb A1C)  Result Value Ref Range   Hemoglobin A1C 7.6 (A) 4.0 - 5.6 %   HbA1c POC (<> result, manual entry) 7.6 4.0 - 5.6 %   HbA1c, POC (prediabetic range) 7.6 (A) 5.7 - 6.4 %   HbA1c, POC (controlled diabetic range) 7.6 (A) 0.0 - 7.0 %    Last CBC Lab Results  Component Value Date   WBC 8.4 05/12/2022   HGB 14.3  05/12/2022   HCT 42.0 05/12/2022   MCV 89.4 05/12/2022   MCH 28.9 05/12/2022   RDW 14.4 05/12/2022   PLT 364 01/77/9390   Last metabolic panel Lab Results  Component Value Date   GLUCOSE 329 (H) 05/12/2022   NA 138 05/12/2022   K 4.0 05/12/2022   CL 104 05/12/2022   CO2 27 05/03/2022   BUN 9 05/12/2022   CREATININE 0.90 05/12/2022   GFRNONAA >60 05/03/2022   CALCIUM 9.1 05/03/2022   PHOS 4.4 02/07/2012   PROT 6.9 03/30/2022   ALBUMIN 4.4 03/30/2022   LABGLOB 2.5 03/30/2022   AGRATIO 1.8 03/30/2022   BILITOT 0.4 03/30/2022   ALKPHOS 79 03/30/2022   AST 19 03/30/2022   ALT 23 03/30/2022   ANIONGAP 7 05/03/2022   Last lipids Lab Results  Component Value Date   CHOL 113 12/29/2021   HDL 43 12/29/2021   LDLCALC 55 12/29/2021   TRIG 74 12/29/2021   CHOLHDL 2.6 12/29/2021   Last hemoglobin A1c Lab Results  Component Value Date   HGBA1C 7.6 (A) 07/20/2022   HGBA1C 7.6 07/20/2022   HGBA1C 7.6 (A) 07/20/2022   HGBA1C 7.6 (A) 07/20/2022   Last thyroid functions No results found for: "TSH", "T3TOTAL", "T4TOTAL", "THYROIDAB" Last vitamin D No results found for: "25OHVITD2", "25OHVITD3", "VD25OH" Last vitamin B12 and Folate No results found for: "VITAMINB12", "FOLATE"    The ASCVD Risk score (Arnett DK, et al., 2019) failed to calculate for the following reasons:   The valid total cholesterol range is 130 to 320 mg/dL    Assessment & Plan:  Problem List Items Addressed This Visit       Endocrine   DM (diabetes mellitus) ([place]) - Primary   Relevant Orders   POCT glycosylated hemoglobin (Hb A1C) (Completed)   Comprehensive metabolic panel     Other   Dyslipidemia   Other Visit Diagnoses     Tobacco use disorder       Essential hypertension       Relevant Orders   Comprehensive metabolic panel     1. Type 2 diabetes mellitus with diabetic neuropathy, without long-term current use of insulin (HCC) The patient has had great improvement with his  hemoglobin A1c over the past several months.  Currently, A1c is 7.6, which is improved from 9.3.  Patient advised to continue carbohydrate modified diet.  No changes will be made to his medication regimen at this time.  He does not warrant any insulin therapy. - POCT glycosylated hemoglobin (Hb A1C) - Comprehensive metabolic panel  2. Tobacco use disorder Smoking cessation instruction/counseling given:  counseled patient on the dangers of tobacco use, advised patient to stop smoking, and reviewed strategies to maximize success   3. Dyslipidemia The patient is asked to make an attempt to improve diet and exercise patterns to aid in medical management of this problem.  4. Essential hypertension BP 139/74 (BP Location: Right Arm, Patient Position: Sitting, Cuff Size: Large)   Pulse 77   Temp (!) 97.5 F (36.4 C)   Ht 6' 2"  (1.88 m)   Wt 206 lb (93.4 kg)   SpO2 100%   BMI 26.45 kg/m   - Comprehensive metabolic panel   Return in about 6 months (around 01/18/2023) for diabetes.   [name]  APRN, MSN, FNP-C [facility] [address] [place], [place] [address] [phone]

## 2022-07-21 LAB — COMPREHENSIVE METABOLIC PANEL
ALT: 15 IU/L (ref 0–44)
AST: 15 IU/L (ref 0–40)
Albumin/Globulin Ratio: 1.5 (ref 1.2–2.2)
Albumin: 4.1 g/dL (ref 3.8–4.9)
Alkaline Phosphatase: 74 IU/L (ref 44–121)
BUN/Creatinine Ratio: 8 — ABNORMAL LOW (ref 9–20)
BUN: 9 mg/dL (ref 6–24)
Bilirubin Total: 0.2 mg/dL (ref 0.0–1.2)
CO2: 20 mmol/L (ref 20–29)
Calcium: 9.4 mg/dL (ref 8.7–10.2)
Chloride: 106 mmol/L (ref 96–106)
Creatinine, Ser: 1.14 mg/dL (ref 0.76–1.27)
Globulin, Total: 2.7 g/dL (ref 1.5–4.5)
Glucose: 186 mg/dL — ABNORMAL HIGH (ref 70–99)
Potassium: 3.9 mmol/L (ref 3.5–5.2)
Sodium: 142 mmol/L (ref 134–144)
Total Protein: 6.8 g/dL (ref 6.0–8.5)
eGFR: 75 mL/min/{1.73_m2} (ref >59)

## 2022-07-21 NOTE — Progress Notes (Signed)
Jared [name] is a 56 year old male that presented for a 82-monthfollow-up of his chronic conditions on 07/20/2022.  Patient's hemoglobin A1c was markedly improved at 7.6 from 9.3 three months ago.  No medication changes are warranted at this time.  Recommend that patient continues to follow his low carbohydrate diet divided over small meals throughout the day.  Also, increase daily physical activity as tolerated.  Please have patient follow-up in 3 months as scheduled. LDonia Pounds APRN, MSN, FNP-C [facility] [place] [place] [phone]

## 2022-08-12 ENCOUNTER — Other Ambulatory Visit: Payer: Medicare Other

## 2022-08-12 ENCOUNTER — Encounter: Payer: Self-pay | Admitting: Nurse Practitioner

## 2022-08-12 ENCOUNTER — Ambulatory Visit (INDEPENDENT_AMBULATORY_CARE_PROVIDER_SITE_OTHER): Payer: Medicare Other | Admitting: Nurse Practitioner

## 2022-08-12 VITALS — BP 110/70 | HR 74 | Ht 74.0 in | Wt 212.0 lb

## 2022-08-12 DIAGNOSIS — R197 Diarrhea, unspecified: Secondary | ICD-10-CM

## 2022-08-12 DIAGNOSIS — K50019 Crohn's disease of small intestine with unspecified complications: Secondary | ICD-10-CM | POA: Diagnosis not present

## 2022-08-12 NOTE — Patient Instructions (Signed)
_______________________________________________________  If you are age 56 or older, your body mass index should be between 23-30. Your Body mass index is 27.22 kg/m. If this is out of the aforementioned range listed, please consider follow up with your Primary Care Provider.  If you are age 4 or younger, your body mass index should be between 19-25. Your Body mass index is 27.22 kg/m. If this is out of the aformentioned range listed, please consider follow up with your Primary Care Provider.   ________________________________________________________  The [facility] providers would like to encourage you to use [facility] to communicate with providers for non-urgent requests or questions.  Due to long hold times on the telephone, sending your provider a message by [facility] may be a faster and more efficient way to get a response.  Please allow 48 business hours for a response.  Please remember that this is for non-urgent requests.  _______________________________________________________ Your provider has requested that you go to the basement level for lab work before leaving today. Press "B" on the elevator. The lab is located at the first door on the left as you exit the elevator.  Go to the ED if you develop severe abdominal pain, nausea, or vomiting

## 2022-08-12 NOTE — Progress Notes (Signed)
08/12/2022 Jared [name] [id number] 1966/09/20   Chief Complaint: Crohn's disease follow-up  History of Present Illness: Jared [name] is a 56 year old male with a past medical history of diabetes mellitus type 2, Bell's palsy and Crohn's ileitis status post small bowel resection 2013 (-25 cm of terminal ileum, IC valve, and 5 cm of cecum were resected).  He was last seen in office by Dr. Tarri Glenn 10/30/2020.  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  Humira was initiated with plans to follow-up in office in 4 to 6 weeks and to schedule a colonoscopy in 6 months to assess response to Humira.  He reported starting Humira 11/2020 then discontinue taking it 02/2021 due to lack of health insurance.  He remains off budesonide since around 02/2021 as well.  He presents today for Crohn's follow-up and to consider restarting Humira.  He continues to have chronic intermittent RLQ pain and bloat.  No severe abdominal pain.  He was previously passing a normal formed brown bowel movement daily.  However, he ate a salad on 08/02/2022 and since then he is passing 3 nonbloody brown water to mud like diarrhea bowel movements daily.  No nausea or vomiting.  No dysphagia or heartburn.  No fever, sweats or chills.  His weight fluctuates up and down by a few pounds.  He takes ibuprofen 800 mg 4 days monthly for right left shoulder pain.  Prior Lab Summary: Fecal calprotectin 03/07/2020 was 211 and increased to 4077/14/21.  It had been 57 9 months ago and 213 a year ago prior to starting budesonide. Labs 03/04/2020 show hemoglobin A1c of 9.1 Labs 04/09/2020 showed iron 46, ferritin 10.1, CRP 6.180, ESR 57.  Screening for chronic hepatitis B and C was negative.  Quant interferon TB Gold negative.  Testing for EBV RSV and Covid negative.  He has antibodies to varicella. Labs 05/27/2020 showing iron of 47, ferritin 166.9 Labs 05/28/20: fecal  calprotectin 407 Labs 09/02/20: normal CMP except for glucose 193     Latest Ref Rng & Units 05/12/2022    9:30 PM 05/12/2022    7:51 PM 05/03/2022    5:38 AM  CBC  WBC 4.0 - 10.5 K/uL  8.4  7.1   Hemoglobin 13.0 - 17.0 g/dL 14.3  13.1  12.7   Hematocrit 39.0 - 52.0 % 42.0  40.6  39.4   Platelets 150 - 400 K/uL  364  370          Latest Ref Rng & Units 07/20/2022   11:21 AM 05/12/2022    9:30 PM 05/03/2022    5:38 AM  CMP  Glucose 70 - 99 mg/dL 186  329  129   BUN 6 - 24 mg/dL _0 Creatinine 0.76 - 1.27 mg/dL 1.14  0.90  0.95   Sodium 134 - 144 mmol/L 142  138  143   Potassium 3.5 - 5.2 mmol/L 3.9  4.0  3.7   Chloride 96 - 106 mmol/L 106  104  109   CO2 20 - 29 mmol/L 20   27   Calcium 8.7 - 10.2 mg/dL 9.4   9.1   Total Protein 6.0 - 8.5 g/dL 6.8     Total Bilirubin 0.0 - 1.2 mg/dL 0.2     Alkaline Phos 44 - 121 IU/L 74     AST 0 - 40 IU/L 15     ALT 0 - 44  IU/L 15       PAST GI PROCEDURES:  EGD 01/17/2019: - Normal esophagus. - Non-bleeding erosive gastropathy. Biopsied. - Nodular mucosa in the duodenal bulb. Biopsied. - The examination was otherwise normal.  Colonoscopy 01/17/2019: - The entire examined colon is essentially normal except for a small patch of erythema in the descending colon. Biopsied. - Ulceration at the ileocolonic anastomosis. Biopsied. - The examination was otherwise normal on direct and retroflexion views.  1. Surgical [P], duodenal - ACUTE NON-SPECIFIC DUODENITIS - SEE COMMENT 2. Surgical [P], gastric antrum and gastric body - MILD CHRONIC GASTRITIS WITHOUT ACTIVITY - NO H. PYLORI OR INTESTINAL METAPLASIA IDENTIFIED - SEE COMMENT 3. Surgical [P], ulcerated anastomosis - COLONIC MUCOSA WITH REACTIVE CHANGES AND FOCAL ACUTE INFLAMMATION - NO GRANULOMATA, DYSPLASIA OR MALIGNANCY IDENTIFIED 4. Surgical [P], right colon - BENIGN COLONIC MUCOSA - NO ACTIVE INFLAMMATION, DYSPLASIA OR MALIGNANCY IDENTIFIED - SEE COMMENT 5. Surgical [P],  colon, transverse - BENIGN COLONIC MUCOSA - NO ACTIVE INFLAMMATION, DYSPLASIA OR MALIGNANCY IDENTIFIED - SEE COMMENT 6. Surgical [P], left colon - BENIGN COLONIC MUCOSA - NO ACTIVE INFLAMMATION, DYSPLASIA OR MALIGNANCY IDENTIFIED - SEE COMMENT 7. Surgical [P], colon, rectum - BENIGN COLONIC MUCOSA - NO ACTIVE INFLAMMATION, DYSPLASIA OR MALIGNANCY IDENTIFIED - SEE COMMENT  PAST IMAGE STUDIES:  CT enterography 01/02/2019: 1. No CT findings for active Crohn's disease. 2. Stable surgical changes with ileocolonic anastomosis. There are some scarring changes in the adjacent small bowel mesentery but I do not see any evidence or obstruction. 3. Stable cluster of lymph nodes in the small bowel mesentery but no mass or overt adenopathy. 4. No acute abdominal findings or mass lesions. 5. Stable bilateral renal calculi but no obstructing ureteral calculi. Cholelithiasis but no CT findings for acute cholecystitis.  CTAP 02/18/2020: IMPRESSION: 1. Surgical changes in the region of the ileocecal junction. Two loops of small bowel which are intimately associated with the each other and the adjacent cecum. This appearance is similar to on the prior. Could be postoperative, related to an end to side anastomosis. 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 Although the more anterior portion of the neoterminal ileum is underdistended, felt to be thick walled. Active Crohn disease cannot be excluded. Other areas of possible low-grade bowel obstruction versus focal ileus within the proximal transverse duodenum and mid jejunum. No convincing evidence of active enteritis in these regions. 2. Developing abdominal adenopathy, likely reactive. 3. Cholelithiasis.  Current Outpatient Medications on File Prior to Visit  Medication Sig Dispense Refill   atorvastatin (LIPITOR) 10 MG tablet TAKE 1 TABLET BY MOUTH DAILY 30 tablet 10   Blood Glucose  Monitoring Suppl (TRUE METRIX METER) w/Device KIT 1 each by Does not apply route 4 (four) times daily -  before meals and at bedtime. 1 kit 0   glucose blood (TRUE METRIX BLOOD GLUCOSE TEST) test strip Use as instructed 100 each 12   hydrOXYzine (ATARAX) 10 MG tablet TAKE 1 TABLET (10 MG TOTAL) BY MOUTH 3 (THREE) TIMES DAILY AS NEEDED. 30 tablet 5   lidocaine (LIDODERM) 5 % Place 1 patch onto the skin daily. Remove & Discard patch within 12 hours or as directed by MD 30 patch 0   lidocaine (XYLOCAINE) 5 % ointment lidocaine 5 % topical ointment-transparent dressing 6 cm X 7 cm kit     lisinopril (ZESTRIL) 20 MG tablet TAKE 1 TABLET BY MOUTH DAILY 30 tablet 10   metFORMIN (GLUCOPHAGE) 1000 MG tablet TAKE 1  TABLET BY MOUTH TWICE DAILY WITH A MEAL 60 tablet 10   omega-3 acid ethyl esters (LOVAZA) 1 g capsule Take 2 g by mouth 2 (two) times daily.     TRADJENTA 5 MG TABS tablet TAKE 1 TABLET BY MOUTH DAILY 30 tablet 10   Adalimumab (HUMIRA PEN) 40 MG/0.4ML PNKT Inject 40 mg into the skin every 14 (fourteen) days. (Patient not taking: Reported on 08/12/2022) 2 each 11   [DISCONTINUED] furosemide (LASIX) 10 MG/ML solution Take by mouth daily.     [DISCONTINUED] mesalamine (PENTASA) 250 MG CR capsule Take 1,000 mg by mouth 4 (four) times daily.     [DISCONTINUED] ONGLYZA 5 MG TABS tablet TAKE 1 TABLET (5 MG TOTAL) BY MOUTH DAILY. (Patient not taking: Reported on 06/03/2020) 60 tablet 3   [DISCONTINUED] pioglitazone (ACTOS) 15 MG tablet Take by mouth daily.     No current facility-administered medications on file prior to visit.   Allergies  Allergen Reactions   Trazodone And Nefazodone     hallucinations    Current Medications, Allergies, Past Medical History, Past Surgical History, Family History and Social History were reviewed in [employer] record.  Review of Systems:   Constitutional: Negative for fever, sweats, chills or weight loss.  Respiratory: Negative for shortness  of breath.   Cardiovascular: Negative for chest pain, palpitations and leg swelling.  Gastrointestinal: See HPI.  Musculoskeletal: + Right and left shoulder pain. Neurological: Negative for dizziness, headaches or paresthesias.   Physical Exam: BP 110/70   Pulse 74   Ht _0  (1.88 m)   Wt 212 lb (96.2 kg)   BMI 27.22 kg/m  Wt Readings from Last 3 Encounters:  08/12/22 212 lb (96.2 kg)  07/20/22 206 lb (93.4 kg)  06/03/22 207 lb (93.9 kg)     General: 56 year old male in no acute distress. Head: Normocephalic and atraumatic. Eyes: No scleral icterus. Conjunctiva pink . Ears: Normal auditory acuity. Mouth: Poor dentition.  No ulcers or lesions.  Lungs: Clear throughout to auscultation. Heart: Regular rate and rhythm, no murmur. Abdomen: Soft, protuberant.  Nontender and nondistended. No masses or hepatomegaly. Normal bowel sounds x 4 quadrants.  Midline abdominal scar intact. Rectal: Deferred. Musculoskeletal: Symmetrical with no gross deformities. Extremities: No edema. Neurological: Alert oriented x 4. No focal deficits.  Psychological: Alert and cooperative. 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 He presents today with intermittent chronic RLQ pain and bloat. Nonbloody diarrhea since 08/02/2022.  -GI pathogen panel which includes C. difficile PCR and fecal calprotectin level.  Normal CBC 05/12/2022. -I will consult with Dr. Tarri Glenn to verify if CT enterography +/- colonoscopy warranted at this time -No Ibuprofen or other NSAIDs -Low fiber diet, drink 6 to 8 glasses of water daily -Patient to follow up with Dr. Tarri Glenn in 2 months   Today's encounter was 30 minutes which included chart/result review, history/exam, face-to-face time used  for counseling, formulating treatment plan with follow-up and documentation

## 2022-08-13 ENCOUNTER — Other Ambulatory Visit: Payer: Medicare Other

## 2022-08-13 ENCOUNTER — Other Ambulatory Visit: Payer: Self-pay

## 2022-08-13 ENCOUNTER — Telehealth: Payer: Self-pay

## 2022-08-13 DIAGNOSIS — K50019 Crohn's disease of small intestine with unspecified complications: Secondary | ICD-10-CM

## 2022-08-13 DIAGNOSIS — R197 Diarrhea, unspecified: Secondary | ICD-10-CM

## 2022-08-13 NOTE — Progress Notes (Signed)
Remo Lipps, pls contact patient and schedule him for a small bowel CT enterography. DX: Crohn's disease. Thx

## 2022-08-13 NOTE — Telephone Encounter (Signed)
Pt was made aware of Carl Best NP recommendations: Pt was ordered and scheduled for a CT enterography at [facility] on 08/19/2022: Pt made aware: Pt to arrive at 1:30 PM Pt made aware Pt to have nothing to eat or drink 4 hours prior starting at 9:30 AM: Pt made aware: Pt verbalized understanding with all questions answered.

## 2022-08-13 NOTE — Telephone Encounter (Signed)
-----   Message from Noralyn Pick, NP sent at 08/13/2022 12:18 PM EDT -----    ----- Message ----- From: Thornton Park, MD Sent: 08/13/2022  11:36 AM EDT To: Noralyn Pick, NP  Reviewed and agree with managementplans. Recommend CTE and stool studies as ordered.   Kimberly L. Tarri Glenn, MD, MPH   ----- Message ----- From: Noralyn Pick, NP Sent: 08/12/2022   4:43 PM EDT To: Thornton Park, MD  Dr. Tarri Glenn, patient was apparently lost to follow-up after he started Humira.  I will forward GI pathogen panel and fecal calprotectin results to you when received.  Let me know if you want him to have a CTE.  Defer Humira recommendations to you.

## 2022-08-16 LAB — GI PROFILE, STOOL, PCR

## 2022-08-17 LAB — CALPROTECTIN, FECAL: Calprotectin, Fecal: 1680 ug/g — ABNORMAL HIGH (ref 0–120)

## 2022-08-18 ENCOUNTER — Other Ambulatory Visit: Payer: Self-pay

## 2022-08-18 DIAGNOSIS — A498 Other bacterial infections of unspecified site: Secondary | ICD-10-CM

## 2022-08-18 DIAGNOSIS — R197 Diarrhea, unspecified: Secondary | ICD-10-CM

## 2022-08-18 MED ORDER — PREDNISONE 10 MG PO TABS
ORAL_TABLET | ORAL | 0 refills | Status: DC
  Filled 2022-08-18: qty 70, 28d supply, fill #0

## 2022-08-19 ENCOUNTER — Encounter (HOSPITAL_COMMUNITY): Payer: Self-pay

## 2022-08-19 ENCOUNTER — Ambulatory Visit (HOSPITAL_COMMUNITY)
Admission: RE | Admit: 2022-08-19 | Discharge: 2022-08-19 | Disposition: A | Discharge status: Home / Self Care | Payer: Medicare Other | Source: Ambulatory Visit | Attending: Nurse Practitioner | Admitting: Nurse Practitioner

## 2022-08-19 DIAGNOSIS — K8020 Calculus of gallbladder without cholecystitis without obstruction: Secondary | ICD-10-CM | POA: Diagnosis not present

## 2022-08-19 DIAGNOSIS — K50019 Crohn's disease of small intestine with unspecified complications: Secondary | ICD-10-CM | POA: Insufficient documentation

## 2022-08-19 DIAGNOSIS — N200 Calculus of kidney: Secondary | ICD-10-CM | POA: Diagnosis not present

## 2022-08-19 DIAGNOSIS — K6389 Other specified diseases of intestine: Secondary | ICD-10-CM | POA: Diagnosis not present

## 2022-08-19 DIAGNOSIS — K56699 Other intestinal obstruction unspecified as to partial versus complete obstruction: Secondary | ICD-10-CM | POA: Diagnosis not present

## 2022-08-19 MED ORDER — BARIUM SULFATE 0.1 % PO SUSP
450.0000 mL | Freq: Once | ORAL | Status: AC
  Administered 2022-08-19: 450 mL via ORAL

## 2022-08-19 MED ORDER — BARIUM SULFATE 0.1 % PO SUSP
ORAL | Status: AC
  Filled 2022-08-19: qty 3

## 2022-08-19 MED ORDER — SODIUM CHLORIDE (PF) 0.9 % IJ SOLN
INTRAMUSCULAR | Status: AC
  Filled 2022-08-19: qty 50

## 2022-08-19 MED ORDER — IOHEXOL 300 MG/ML  SOLN
100.0000 mL | Freq: Once | INTRAMUSCULAR | Status: AC | PRN
  Administered 2022-08-19: 100 mL via INTRAVENOUS

## 2022-08-24 ENCOUNTER — Other Ambulatory Visit: Payer: Self-pay

## 2022-08-24 DIAGNOSIS — K50019 Crohn's disease of small intestine with unspecified complications: Secondary | ICD-10-CM

## 2022-08-24 DIAGNOSIS — R197 Diarrhea, unspecified: Secondary | ICD-10-CM

## 2022-08-24 DIAGNOSIS — A498 Other bacterial infections of unspecified site: Secondary | ICD-10-CM

## 2022-08-24 MED ORDER — HUMIRA-CD/UC/HS STARTER 40 MG/0.8ML ~~LOC~~ PNKT: PEN_INJECTOR | SUBCUTANEOUS | 0 refills | Status: DC

## 2022-08-24 MED ORDER — AZITHROMYCIN 500 MG PO TABS: 500.0000 mg | ORAL_TABLET | Freq: Every day | ORAL | 0 refills | Status: AC

## 2022-08-24 MED ORDER — PREDNISONE 10 MG PO TABS: ORAL_TABLET | ORAL | 0 refills | Status: AC

## 2022-08-24 MED ORDER — HUMIRA (2 PEN) 40 MG/0.4ML ~~LOC~~ AJKT: 1.0000 | AUTO-INJECTOR | SUBCUTANEOUS | 6 refills | Status: DC

## 2022-08-26 ENCOUNTER — Other Ambulatory Visit: Payer: Self-pay | Admitting: Gastroenterology

## 2022-08-26 DIAGNOSIS — K50019 Crohn's disease of small intestine with unspecified complications: Secondary | ICD-10-CM

## 2022-09-03 ENCOUNTER — Telehealth: Payer: Self-pay

## 2022-09-03 NOTE — Telephone Encounter (Signed)
Follow up was made on pt to ensure that all is well with pt Humira Injections and [facility] referral: Pt stated that he receives his Humira Injection on 09/09/2022: Pt also stated that [facility] has already contacted him and he has an appointment set up with them: Pt will need follow up labs in a few months per CT scan notes done on 08/19/2022:  Reminder placed in Epic for 11/09/2022

## 2022-09-13 ENCOUNTER — Other Ambulatory Visit: Payer: Self-pay

## 2022-09-14 ENCOUNTER — Other Ambulatory Visit: Payer: Self-pay

## 2022-10-13 ENCOUNTER — Ambulatory Visit (INDEPENDENT_AMBULATORY_CARE_PROVIDER_SITE_OTHER): Payer: Medicare Other | Admitting: Gastroenterology

## 2022-10-13 ENCOUNTER — Encounter: Payer: Self-pay | Admitting: Gastroenterology

## 2022-10-13 VITALS — BP 130/68 | HR 100 | Ht 74.0 in | Wt 221.0 lb

## 2022-10-13 DIAGNOSIS — R140 Abdominal distension (gaseous): Secondary | ICD-10-CM | POA: Diagnosis not present

## 2022-10-13 DIAGNOSIS — K50019 Crohn's disease of small intestine with unspecified complications: Secondary | ICD-10-CM | POA: Diagnosis not present

## 2022-10-13 DIAGNOSIS — D509 Iron deficiency anemia, unspecified: Secondary | ICD-10-CM | POA: Diagnosis not present

## 2022-10-13 DIAGNOSIS — R1084 Generalized abdominal pain: Secondary | ICD-10-CM

## 2022-10-13 NOTE — Patient Instructions (Addendum)
I did not change your medications.   Avoid all non-steroidal anti-inflammatory medications.   Please stop in the lab on the morning of your next injection prior to taking the injection.  I would like to see you back in the office in 3 months.   Please let me know if you need anything prior to that time.

## 2022-10-13 NOTE — Progress Notes (Signed)
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 If not clinically improving, will need to repeat abdominal imaging and re-refer to surgery.      PLAN: - Continue to avoid NSAIDs - Fecal calprotectin, CBC, iron, and ferritin - Humira trough levels and antibody testing prior to his next dose - Continue Humira 160 mg every 2 weeks - Low threshold to repeat abdominal imaging and refer for surgical resection - Follow-up with me in 3 months, earlier if needed   HPI: Jared [name] is a 56 y.o. male with Crohn's disease and abdominal pain.  History of Crohn's ileitis status post small bowel resection 2013 (-25 cm of terminal ileum, IC valve, and 5 cm of cecum were resected).  I last saw him 10/30/2020. 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 He more recently saw Carl Best 08/13/22.    He reported starting Humira 11/2020 then discontinue taking it 02/2021 due to lack of health insurance.  He remains off budesonide since around 02/2021 as well.  Resumed Humira when seen by Carl Best 08/12/2022 with ongoing intermittent RLQ pain and bloat.    CTE 10//23 showed persistent stricture involving 7cm of the terminal ileum and suspected low-grade SBO.   He restarted Humira 09/21/22.  He continues to have bloating but no significant pain, as has been his history. He does not feel better  yet. He is having 2 bowel movements daily. He finds the stool has been more solid recently but he attributes this to his diet choices. No blood or mucous. Weight continues to fluctuate.  Shoulder pain has improved with the use of castor oil. He is no longer using NSAIDs.  He was referred to surgery but did not keep his appointment due to flu-like symptoms  No extraGI manifestations of IBD.    Prior Lab Summary: Fecal calprotectin 03/07/2020 was 211 and increased to 4077/14/21.  It had been 57 9 months ago and 213 a year ago prior to starting budesonide. Labs 03/04/2020 show hemoglobin A1c of 9.1 Labs 04/09/2020 showed iron 46, ferritin 10.1, CRP 6.180, ESR 57.  Screening for chronic hepatitis B and C was negative.  Quant interferon TB Gold negative.  Testing for EBV RSV and Covid negative.  He has antibodies to varicella. Labs 05/27/2020 showing iron of 47, ferritin 166.9 Labs 05/28/20: fecal calprotectin 407 Labs 09/02/20: normal CMP except for glucose 193 Labs 07/20/22: normal CMP except for glucose 186 Labs 08/13/22: GI pathogen panel positive for Enteropathogenic E coli, fecal calprotectin 1680  Recent imaging - CT scan abd/pelvis with contrast from 02/19/20 showing surgical changes at the ileocecal junction. Possible active crohns disease. Low grade obstruction versus focal ileus.  - CTE 10//23 showed persistent stricture involving 7cm of the terminal ileum and suspected low-grade SBO.    Past Medical History:  Diagnosis Date   Crohn disease ([place]) 2008   with hospital admission in 2011, and 8/12 for flare  up and SBO relieved with bowel rest. no suregery, no meds for CD   Diabetes mellitus type II    Hypertension     Past Surgical History:  Procedure Laterality Date   BOWEL RESECTION  01/25/2012   Procedure: SMALL BOWEL RESECTION;  Surgeon: 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, MD;  Location: [place];  Service: General;  Laterality: N/A;   SCROTAL SURGERY  ~ 3   "took out a pellet"    Current Outpatient Medications  Medication Sig Dispense Refill   Adalimumab (HUMIRA PEN) 40 MG/0.4ML PNKT Inject 40 mg into the skin every 14 (fourteen) days. (Patient not taking: Reported on 08/12/2022) 2 each 11   Adalimumab (HUMIRA PEN) 40 MG/0.4ML PNKT Inject 1 Pen into the skin every 14 (fourteen) days. 2 each 6   Adalimumab (HUMIRA PEN-CD/UC/HS STARTER) 40 MG/0.8ML PNKT Day 1:  Inject 160 mg subcutaneous  Day 15: Inject 80 mg subcutaneous 6 each 0   atorvastatin (LIPITOR) 10 MG tablet TAKE 1 TABLET BY MOUTH DAILY 30 tablet 10   Blood Glucose Monitoring Suppl (TRUE METRIX METER) w/Device KIT 1 each by Does not apply route 4 (four) times daily -  before meals and at bedtime. 1 kit 0   glucose blood (TRUE METRIX BLOOD GLUCOSE TEST) test strip Use as instructed 100 each 12   HUMIRA PEN-CD/UC/HS STARTER 40 MG/0.8ML PNKT DAY 1:  INJECT 160 MG SUBCUTANEOUS  DAY 15: INJECT 80 MG SUBCUTANEOUS 3 each 0   hydrOXYzine (ATARAX) 10 MG tablet TAKE 1 TABLET (10 MG TOTAL) BY MOUTH 3 (THREE) TIMES DAILY AS NEEDED. 30 tablet 5   lidocaine (LIDODERM) 5 % Place 1 patch onto the skin daily. Remove & Discard patch within 12 hours or as directed by MD (Patient not taking: Reported on 10/19/2022) 30 patch 0   lidocaine (XYLOCAINE) 5 % ointment lidocaine 5 % topical ointment-transparent dressing 6 cm X 7 cm kit (Patient not taking: Reported on 10/19/2022)     lisinopril (ZESTRIL) 20 MG tablet TAKE 1 TABLET BY MOUTH DAILY 30 tablet 10   metFORMIN (GLUCOPHAGE) 1000 MG tablet TAKE 1 TABLET BY MOUTH TWICE DAILY WITH A MEAL 60 tablet 10   nicotine (NICODERM CQ - DOSED IN MG/24 HOURS) 14 mg/24hr patch Place 1 patch (14 mg total) onto the skin daily. 28 patch 3   omega-3 acid ethyl esters (LOVAZA) 1 g capsule Take 2 g by mouth 2 (two) times  daily.     TRADJENTA 5 MG TABS tablet TAKE 1 TABLET BY MOUTH DAILY 30 tablet 10   No current facility-administered medications for this visit.    Allergies as of 10/13/2022 - Review Complete 10/13/2022  Allergen Reaction Noted   Trazodone and nefazodone  12/28/2018    Family History  Problem Relation Age of Onset   Cancer Mother        lung   Diabetes Mother    Alopecia Mother    Coronary artery disease Mother    Diabetes Sister    Hyperlipidemia Sister    Hypertension Sister    Angina Brother    Hepatitis Brother    Alcohol abuse Brother    Diabetes Brother    Colon cancer Paternal Grandmother    Esophageal cancer Neg Hx    Stomach cancer Neg  Hx    Rectal cancer Neg Hx      Physical Exam: General: in no acute distress. Appears well.  Neuro: Alert and appropriate Psych: Normal affect and normal insight Abd: nontender; normal bowel sounds. No rebound or guarding. Skin: no rash or bruise  Laquinta Hazell L. Tarri Glenn, MD, MPH [facility] 10/25/2022, 10:48 AM

## 2022-10-19 ENCOUNTER — Ambulatory Visit (INDEPENDENT_AMBULATORY_CARE_PROVIDER_SITE_OTHER): Payer: Medicare Other | Admitting: Family Medicine

## 2022-10-19 ENCOUNTER — Encounter: Payer: Self-pay | Admitting: Family Medicine

## 2022-10-19 ENCOUNTER — Other Ambulatory Visit: Payer: Self-pay

## 2022-10-19 VITALS — BP 124/60 | HR 73 | Temp 98.4°F | Ht 74.0 in | Wt 218.0 lb

## 2022-10-19 DIAGNOSIS — F17200 Nicotine dependence, unspecified, uncomplicated: Secondary | ICD-10-CM | POA: Diagnosis not present

## 2022-10-19 DIAGNOSIS — I10 Essential (primary) hypertension: Secondary | ICD-10-CM | POA: Diagnosis not present

## 2022-10-19 DIAGNOSIS — E119 Type 2 diabetes mellitus without complications: Secondary | ICD-10-CM

## 2022-10-19 LAB — POCT GLYCOSYLATED HEMOGLOBIN (HGB A1C): Hemoglobin A1C: 7.8 % — AB (ref 4.0–5.6)

## 2022-10-19 MED ORDER — NICOTINE 14 MG/24HR TD PT24
14.0000 mg | MEDICATED_PATCH | Freq: Every day | TRANSDERMAL | 3 refills | Status: DC
  Filled 2022-10-19: qty 28, 28d supply, fill #0
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
## 2022-10-19 NOTE — Patient Instructions (Signed)
Diabetes Mellitus Action Plan Following a diabetes action plan is a way for you to manage your diabetes (diabetes mellitus) symptoms. The plan is color-coded to help you understand what actions you need to take based on any symptoms you are having. If you have symptoms in the red zone, you need medical care right away. If you have symptoms in the yellow zone, you are having problems. If you have symptoms in the green zone, you are doing well. Learning about and understanding diabetes can take time. Follow the plan that you develop with your health care provider. Know the target range for your blood sugar (glucose) level, and review your treatment plan with your health care provider at each visit. The target range for my blood sugar level is __________________________ mg/dL. Red zone Get medical help right away if you have any of the following symptoms: A blood sugar test result that is below 54 mg/dL (3 mmol/L). A blood sugar test result that is at or above 240 mg/dL (13.3 mmol/L) for 2 days in a row. Confusion or trouble thinking clearly. Difficulty breathing. Sickness or a fever for 2 or more days that is not getting better. Moderate or large ketone levels in your urine. Feeling tired or having no energy. If you have any red zone symptoms, do not wait to see if the symptoms will go away. Get medical help right away. Call your local emergency services (911 in the [place] Do not drive yourself to the hospital. If you have severely low blood sugar (severe hypoglycemia) and you cannot eat or drink, you may need glucagon. Make sure a family member or close friend knows how to check your blood sugar and how to give you glucagon. You may need to be treated in a hospital for this condition. Yellow zone If you have any of the following symptoms, your diabetes is not under control and you may need to make some changes: A blood sugar test result that is at or above 240 mg/dL (13.3 mmol/L) for 2 days in a  row. Blood sugar test results that are below 70 mg/dL (3.9 mmol/L). Other symptoms of hypoglycemia, such as: Shaking or feeling light-headed. Confusion or irritability. Feeling hungry. Having a fast heartbeat. If you have any yellow zone symptoms: Treat your hypoglycemia by eating or drinking 15 grams of a rapid-acting carbohydrate. Follow the 15:15 rule: Take 15 grams of a rapid-acting carbohydrate, such as: 1 tube of glucose gel. 4 glucose pills. 4 oz (120 mL) of fruit juice. 4 oz (120 mL) of regular (not diet) soda. Check your blood sugar 15 minutes after you take the carbohydrate. If the repeat blood sugar test is still at or below 70 mg/dL (3.9 mmol/L), take 15 grams of a carbohydrate again. If your blood sugar does not increase above 70 mg/dL (3.9 mmol/L) after 3 tries, get medical help right away. After your blood sugar returns to normal, eat a meal or a snack within 1 hour. Keep taking your daily medicines as told by your health care provider. Check your blood sugar more often than you normally would. Write down your results. Call your health care provider if you have trouble keeping your blood sugar in your target range.  Green zone These signs mean you are doing well and you can continue what you are doing to manage your diabetes: Your blood sugar is within your personal target range. For most people, a blood sugar level before a meal (preprandial) should be 80-130 mg/dL (4.4-7.2 mmol/L). You feel  well, and you are able to do daily activities. If you are in the green zone, continue to manage your diabetes as told by your health care provider. To do this: Eat a healthy diet. Exercise regularly. Check your blood sugar as told by your health care provider. Take your medicines as told by your health care provider.  Where to find more information American Diabetes Association (ADA): diabetes.org Association of Diabetes Care & Education Specialists (ADCES):  diabeteseducator.org Summary Following a diabetes action plan is a way for you to manage your diabetes symptoms. The plan is color-coded to help you understand what actions you need to take based on any symptoms you are having. Follow the plan that you develop with your health care provider. Make sure you know your personal target blood sugar level. Review your treatment plan with your health care provider at each visit. This information is not intended to replace advice given to you by your health care provider. Make sure you discuss any questions you have with your health care provider. Document Revised: 05/08/2020 Document Reviewed: 05/08/2020 Elsevier Patient Education  [place].

## 2022-10-20 LAB — CMP AND LIVER
ALT: 15 IU/L (ref 0–44)
AST: 15 IU/L (ref 0–40)
Albumin: 4.4 g/dL (ref 3.8–4.9)
Alkaline Phosphatase: 79 IU/L (ref 44–121)
BUN: 12 mg/dL (ref 6–24)
Bilirubin Total: 0.3 mg/dL (ref 0.0–1.2)
Bilirubin, Direct: <0.1 mg/dL (ref 0.00–0.40)
CO2: 21 mmol/L (ref 20–29)
Calcium: 9.7 mg/dL (ref 8.7–10.2)
Chloride: 102 mmol/L (ref 96–106)
Creatinine, Ser: 0.84 mg/dL (ref 0.76–1.27)
Glucose: 108 mg/dL — ABNORMAL HIGH (ref 70–99)
Potassium: 4.2 mmol/L (ref 3.5–5.2)
Sodium: 138 mmol/L (ref 134–144)
Total Protein: 7 g/dL (ref 6.0–8.5)
eGFR: 102 mL/min/{1.73_m2} (ref >59)

## 2022-10-20 NOTE — Progress Notes (Signed)
Final Herbon is a 56 year old male with a medical history significant for type 2 diabetes mellitus, hypertension, hyperlipidemia, and Crohn's disease that presented on 10/19/2022 for follow-up of chronic conditions. Hemoglobin A1c was elevated from previous at 7.8.  Discussed the importance of a carbohydrate modified diet at length.  Patient can defer to his after visit summary for written assistance with carbohydrate modified diet.  Also, please remind patient that diabetes and nutrition is available if he chooses.  Patient and I discussed smoking cessation at length.  Nicotine patches sent to pharmacy.  Remind patient about establishing a quit date.  [name]  APRN, MSN, FNP-C Patient [facility] [address] [place], [place] [address] [phone]

## 2022-10-21 LAB — MICROALBUMIN / CREATININE URINE RATIO
Creatinine, Urine: 129.7 mg/dL
Microalb/Creat Ratio: 3 mg/g creat (ref 0–29)
Microalbumin, Urine: 4.4 ug/mL

## 2022-10-25 ENCOUNTER — Encounter: Payer: Self-pay | Admitting: Gastroenterology

## 2022-10-26 ENCOUNTER — Other Ambulatory Visit: Payer: Self-pay

## 2022-10-26 ENCOUNTER — Telehealth: Payer: Self-pay

## 2022-10-26 DIAGNOSIS — K50019 Crohn's disease of small intestine with unspecified complications: Secondary | ICD-10-CM

## 2022-10-26 NOTE — Telephone Encounter (Signed)
Received personal reminder in Epic:  Chart reviewed:   10/13/2022 Dr. Modena Nunnery Note stated  PLAN: - Humira trough levels and antibody testing prior to his next dose   Order entered for lab: Pt made aware: Pt notified to come 1 to 2 days prior to next injection: Pt verbalized understanding with all questions answered.

## 2022-10-26 NOTE — Telephone Encounter (Signed)
-----   Message from Gillermina Hu, RN sent at 08/25/2022  2:23 PM EDT ----- Personal reminder sent 08/25/2022 Patient will need Humira drug and antibody levels done a few months after he restarts Humira

## 2022-11-01 ENCOUNTER — Other Ambulatory Visit: Payer: Medicare Other

## 2022-11-01 DIAGNOSIS — K50019 Crohn's disease of small intestine with unspecified complications: Secondary | ICD-10-CM

## 2022-11-04 ENCOUNTER — Other Ambulatory Visit: Payer: Self-pay | Admitting: Family Medicine

## 2022-11-04 DIAGNOSIS — E1140 Type 2 diabetes mellitus with diabetic neuropathy, unspecified: Secondary | ICD-10-CM

## 2022-11-06 LAB — SERIAL MONITORING

## 2022-11-10 LAB — ADALIMUMAB+AB (SERIAL MONITOR)
Adalimumab Drug Level: 0.8 ug/mL
Anti-Adalimumab Antibody: 97 ng/mL

## 2022-11-12 ENCOUNTER — Other Ambulatory Visit: Payer: Self-pay

## 2022-11-18 ENCOUNTER — Other Ambulatory Visit: Payer: Self-pay

## 2022-12-01 ENCOUNTER — Other Ambulatory Visit: Payer: Self-pay

## 2022-12-02 ENCOUNTER — Other Ambulatory Visit: Payer: Self-pay

## 2022-12-16 ENCOUNTER — Telehealth: Payer: Self-pay | Admitting: Family Medicine

## 2022-12-16 NOTE — Telephone Encounter (Signed)
Left message for patient to call back and schedule Medicare Annual Wellness Visit (AWV) in office.   If not able to come in office, please offer to do virtually or by telephone.  Left office number and my jabber #[phone].  AWVI eligible as of 01/13/2021  Please schedule at anytime with Nurse Health Advisor.  

## 2023-01-18 ENCOUNTER — Ambulatory Visit (INDEPENDENT_AMBULATORY_CARE_PROVIDER_SITE_OTHER): Payer: 59 | Admitting: Family Medicine

## 2023-01-18 ENCOUNTER — Other Ambulatory Visit: Payer: Self-pay

## 2023-01-18 ENCOUNTER — Telehealth: Payer: Self-pay | Admitting: Family Medicine

## 2023-01-18 VITALS — BP 126/66 | HR 83 | Temp 97.3°F | Ht 74.0 in | Wt 213.8 lb

## 2023-01-18 DIAGNOSIS — F17200 Nicotine dependence, unspecified, uncomplicated: Secondary | ICD-10-CM

## 2023-01-18 DIAGNOSIS — E1140 Type 2 diabetes mellitus with diabetic neuropathy, unspecified: Secondary | ICD-10-CM | POA: Diagnosis not present

## 2023-01-18 DIAGNOSIS — I10 Essential (primary) hypertension: Secondary | ICD-10-CM

## 2023-01-18 DIAGNOSIS — G9589 Other specified diseases of spinal cord: Secondary | ICD-10-CM

## 2023-01-18 DIAGNOSIS — K50019 Crohn's disease of small intestine with unspecified complications: Secondary | ICD-10-CM

## 2023-01-18 LAB — POCT GLYCOSYLATED HEMOGLOBIN (HGB A1C): Hemoglobin A1C: 9.6 % — AB (ref 4.0–5.6)

## 2023-01-18 MED ORDER — LINAGLIPTIN 5 MG PO TABS
5.0000 mg | ORAL_TABLET | Freq: Every day | ORAL | 1 refills | Status: DC
  Filled 2023-01-18: qty 90, 90d supply, fill #0

## 2023-01-18 NOTE — Telephone Encounter (Signed)
Contacted Jared [name] to schedule their annual wellness visit. Appointment made for 01/27/2023.  Thank you,  [place] Direct dial  [phone]
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
## 2023-01-18 NOTE — Patient Instructions (Signed)
No medication changes today 

## 2023-01-19 LAB — COMPREHENSIVE METABOLIC PANEL
ALT: 26 IU/L (ref 0–44)
AST: 17 IU/L (ref 0–40)
Albumin/Globulin Ratio: 1.4 (ref 1.2–2.2)
Albumin: 3.9 g/dL (ref 3.8–4.9)
Alkaline Phosphatase: 85 IU/L (ref 44–121)
BUN/Creatinine Ratio: 9 (ref 9–20)
BUN: 11 mg/dL (ref 6–24)
Bilirubin Total: 0.4 mg/dL (ref 0.0–1.2)
CO2: 19 mmol/L — ABNORMAL LOW (ref 20–29)
Calcium: 9.3 mg/dL (ref 8.7–10.2)
Chloride: 106 mmol/L (ref 96–106)
Creatinine, Ser: 1.18 mg/dL (ref 0.76–1.27)
Globulin, Total: 2.7 g/dL (ref 1.5–4.5)
Glucose: 175 mg/dL — ABNORMAL HIGH (ref 70–99)
Potassium: 4.1 mmol/L (ref 3.5–5.2)
Sodium: 143 mmol/L (ref 134–144)
Total Protein: 6.6 g/dL (ref 6.0–8.5)
eGFR: 72 mL/min/{1.73_m2} (ref >59)

## 2023-01-19 LAB — LIPID PANEL
Chol/HDL Ratio: 3.2 ratio (ref 0.0–5.0)
Cholesterol, Total: 108 mg/dL (ref 100–199)
HDL: 34 mg/dL — ABNORMAL LOW (ref >39)
LDL Chol Calc (NIH): 54 mg/dL (ref 0–99)
Triglycerides: 107 mg/dL (ref 0–149)
VLDL Cholesterol Cal: 20 mg/dL (ref 5–40)

## 2023-01-26 ENCOUNTER — Telehealth: Payer: Self-pay | Admitting: Family Medicine

## 2023-01-26 NOTE — Telephone Encounter (Signed)
I left a message on patient's voice mail to remind him of his [facility] phone call appointment scheduled on 01/27/23 at 10:00.

## 2023-01-27 ENCOUNTER — Ambulatory Visit (INDEPENDENT_AMBULATORY_CARE_PROVIDER_SITE_OTHER): Payer: 59

## 2023-01-27 VITALS — Ht 74.0 in | Wt 213.0 lb

## 2023-01-27 DIAGNOSIS — Z0000 Encounter for general adult medical examination without abnormal findings: Secondary | ICD-10-CM

## 2023-01-27 NOTE — Patient Instructions (Addendum)
Jared [name] , Thank you for taking time to come for your Medicare Wellness Visit. I appreciate your ongoing commitment to your health goals. Please review the following plan we discussed and let me know if I can assist you in the future.   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      Quit smoking / using tobacco        This is a list of the screening recommended for you and due dates:  Health Maintenance  Topic Date Due   Eye exam for diabetics  10/06/2019   COVID-19 Vaccine (2 - 2023-24 season) 02/12/2023*   Flu Shot  02/13/2023*   Zoster (Shingles) Vaccine (1 of 2) 04/29/2023*   Hemoglobin A1C  07/21/2023   Yearly kidney health urinalysis for diabetes  10/20/2023   Yearly kidney function blood test for diabetes  01/18/2024   Complete foot exam   01/18/2024   Medicare Annual Wellness Visit  01/27/2024   Colon Cancer Screening  01/16/2029   DTaP/Tdap/Td vaccine (3 - Td or Tdap) 04/14/2030   Hepatitis C Screening: USPSTF Recommendation to screen - Ages 24-79 yo.  Completed   HIV Screening  Completed   HPV Vaccine  Aged Out  *Topic was postponed. The date shown is not the original due date.    Advanced directives: Advance directive discussed with you today. Even though you declined this today, please call our office should you change your mind, and we can give you the proper paperwork for you to fill out.   Conditions/risks identified: None  Next appointment: Follow up in one year for your annual wellness visit    Preventive Care 40-64 Years, Male Preventive care refers to lifestyle choices and visits with your health care provider that can promote health and wellness. What does preventive care include? A yearly physical exam. This is also called an annual well check. Dental exams once or twice a year. Routine eye exams. Ask your health care provider how often you should have your eyes  checked. Personal lifestyle choices, including: Daily care of your teeth and gums. Regular physical activity. Eating a healthy diet. Avoiding tobacco and drug use. Limiting alcohol use. Practicing safe sex. Taking low-dose aspirin every day starting at age 68. What happens during an annual well check? The services and screenings done by your health care provider during your annual well check will depend on your age, overall health, lifestyle risk factors, and family history of disease. Counseling  Your health care provider may ask you questions about your: Alcohol use. Tobacco use. Drug use. Emotional well-being. Home and relationship well-being. Sexual activity. Eating habits. Work and work Statistician. Screening  You may have the following tests or measurements: Height, weight, and BMI. Blood pressure. Lipid and cholesterol levels. These may be checked every 5 years, or more frequently if you are over 82 years old. Skin check. Lung cancer screening. You may have this screening every year starting at age 62 if you have a 30-pack-year history of smoking and currently smoke or have quit within the past 15 years. Fecal occult blood test (FOBT) of the stool. You may have this test every year starting at age 69. Flexible sigmoidoscopy or colonoscopy. You may have a sigmoidoscopy every 5 years or a colonoscopy every 10 years starting at age 19. Prostate cancer screening. Recommendations will vary depending on  your family history and other risks. Hepatitis C blood test. Hepatitis B blood test. Sexually transmitted disease (STD) testing. Diabetes screening. This is done by checking your blood sugar (glucose) after you have not eaten for a while (fasting). You may have this done every 1-3 years. Discuss your test results, treatment options, and if necessary, the need for more tests with your health care provider. Vaccines  Your health care provider may recommend certain vaccines, such  as: Influenza vaccine. This is recommended every year. Tetanus, diphtheria, and acellular pertussis (Tdap, Td) vaccine. You may need a Td booster every 10 years. Zoster vaccine. You may need this after age 44. Pneumococcal 13-valent conjugate (PCV13) vaccine. You may need this if you have certain conditions and have not been vaccinated. Pneumococcal polysaccharide (PPSV23) vaccine. You may need one or two doses if you smoke cigarettes or if you have certain conditions. Talk to your health care provider about which screenings and vaccines you need and how often you need them. This information is not intended to replace advice given to you by your health care provider. Make sure you discuss any questions you have with your health care provider. Document Released: 11/28/2015 Document Revised: 07/21/2016 Document Reviewed: 09/02/2015 Elsevier Interactive Patient Education  2017 [place] Prevention in the Home Falls can cause injuries. They can happen to people of all ages. There are many things you can do to make your home safe and to help prevent falls. What can I do on the outside of my home? Regularly fix the edges of walkways and driveways and fix any cracks. Remove anything that might make you trip as you walk through a door, such as a raised step or threshold. Trim any bushes or trees on the path to your home. Use bright outdoor lighting. Clear any walking paths of anything that might make someone trip, such as rocks or tools. Regularly check to see if handrails are loose or broken. Make sure that both sides of any steps have handrails. Any raised decks and porches should have guardrails on the edges. Have any leaves, snow, or ice cleared regularly. Use sand or salt on walking paths during winter. Clean up any spills in your garage right away. This includes oil or grease spills. What can I do in the bathroom? Use night lights. Install grab bars by the toilet and in the tub and  shower. Do not use towel bars as grab bars. Use non-skid mats or decals in the tub or shower. If you need to sit down in the shower, use a plastic, non-slip stool. Keep the floor dry. Clean up any water that spills on the floor as soon as it happens. Remove soap buildup in the tub or shower regularly. Attach bath mats securely with double-sided non-slip rug tape. Do not have throw rugs and other things on the floor that can make you trip. What can I do in the bedroom? Use night lights. Make sure that you have a light by your bed that is easy to reach. Do not use any sheets or blankets that are too big for your bed. They should not hang down onto the floor. Have a firm chair that has side arms. You can use this for support while you get dressed. Do not have throw rugs and other things on the floor that can make you trip. What can I do in the kitchen? Clean up any spills right away. Avoid walking on wet floors. Keep items that you use a  lot in easy-to-reach places. If you need to reach something above you, use a strong step stool that has a grab bar. Keep electrical cords out of the way. Do not use floor polish or wax that makes floors slippery. If you must use wax, use non-skid floor wax. Do not have throw rugs and other things on the floor that can make you trip. What can I do with my stairs? Do not leave any items on the stairs. Make sure that there are handrails on both sides of the stairs and use them. Fix handrails that are broken or loose. Make sure that handrails are as long as the stairways. Check any carpeting to make sure that it is firmly attached to the stairs. Fix any carpet that is loose or worn. Avoid having throw rugs at the top or bottom of the stairs. If you do have throw rugs, attach them to the floor with carpet tape. Make sure that you have a light switch at the top of the stairs and the bottom of the stairs. If you do not have them, ask someone to add them for  you. What else can I do to help prevent falls? Wear shoes that: Do not have high heels. Have rubber bottoms. Are comfortable and fit you well. Are closed at the toe. Do not wear sandals. If you use a stepladder: Make sure that it is fully opened. Do not climb a closed stepladder. Make sure that both sides of the stepladder are locked into place. Ask someone to hold it for you, if possible. Clearly mark and make sure that you can see: Any grab bars or handrails. First and last steps. Where the edge of each step is. Use tools that help you move around (mobility aids) if they are needed. These include: Canes. Walkers. Scooters. Crutches. Turn on the lights when you go into a dark area. Replace any light bulbs as soon as they burn out. Set up your furniture so you have a clear path. Avoid moving your furniture around. If any of your floors are uneven, fix them. If there are any pets around you, be aware of where they are. Review your medicines with your doctor. Some medicines can make you feel dizzy. This can increase your chance of falling. Ask your doctor what other things that you can do to help prevent falls. This information is not intended to replace advice given to you by your health care provider. Make sure you discuss any questions you have with your health care provider. Document Released: 08/28/2009 Document Revised: 04/08/2016 Document Reviewed: 12/06/2014 Elsevier Interactive Patient Education  2017 [employer].

## 2023-01-27 NOTE — Progress Notes (Signed)
Subjective:   Jacian [name] is a 57 y.o. male who presents for Medicare Annual/Subsequent preventive examination.  Review of Systems    Virtual Visit via Telephone Note  I connected with  Huron Depaulis on 01/27/23 at 10:00 AM EDT by telephone and verified that I am speaking with the correct person using two identifiers.  Location: Patient: Home Provider: Office Persons participating in the virtual visit: patient/Nurse Health Advisor   I discussed the limitations, risks, security and privacy concerns of performing an evaluation and management service by telephone and the availability of in person appointments. The patient expressed understanding and agreed to proceed.  Interactive audio and video telecommunications were attempted between this nurse and patient, however failed, due to patient having technical difficulties OR patient did not have access to video capability.  We continued and completed visit with audio only.  Some vital signs may be absent or patient reported.   Criselda Peaches, LPN  Cardiac Risk Factors include: advanced age (>33mn, >>35women);male gender;diabetes mellitus;hypertension;smoking/ tobacco exposure     Objective:    Today's Vitals   01/27/23 1016  Weight: 213 lb (96.6 kg)  Height: '6\' 2"'$  (1.88 m)   Body mass index is 27.35 kg/m.     01/27/2023   10:27 AM 05/12/2022    7:35 PM 05/03/2022    5:12 AM 03/10/2022    2:10 PM 02/22/2022   12:45 AM 11/14/2021    2:55 PM 11/07/2021    8:23 AM  Advanced Directives  Does Patient Have a Medical Advance Directive? No No No No No No No  Type of Advance Directive       Living will  Would patient like information on creating a medical advance directive? No - Patient declined  No - Patient declined No - Patient declined  No - Patient declined     Current Medications (verified) Outpatient Encounter Medications as of 01/27/2023  Medication Sig   Adalimumab (HUMIRA PEN) 40 MG/0.4ML PNKT Inject 40 mg into the  skin every 14 (fourteen) days. (Patient not taking: Reported on 08/12/2022)   Adalimumab (HUMIRA PEN) 40 MG/0.4ML PNKT Inject 1 Pen into the skin every 14 (fourteen) days.   Adalimumab (HUMIRA PEN-CD/UC/HS STARTER) 40 MG/0.8ML PNKT Day 1:  Inject 160 mg subcutaneous  Day 15: Inject 80 mg subcutaneous   atorvastatin (LIPITOR) 10 MG tablet TAKE 1 TABLET BY MOUTH DAILY   Blood Glucose Monitoring Suppl (TRUE METRIX METER) w/Device KIT 1 each by Does not apply route 4 (four) times daily -  before meals and at bedtime.   glucose blood (TRUE METRIX BLOOD GLUCOSE TEST) test strip Use as instructed   HUMIRA PEN-CD/UC/HS STARTER 40 MG/0.8ML PNKT DAY 1:  INJECT 160 MG SUBCUTANEOUS  DAY 15: INJECT 80 MG SUBCUTANEOUS   hydrOXYzine (ATARAX) 10 MG tablet TAKE 1 TABLET (10 MG TOTAL) BY MOUTH 3 (THREE) TIMES DAILY AS NEEDED.   lidocaine (LIDODERM) 5 % Place 1 patch onto the skin daily. Remove & Discard patch within 12 hours or as directed by MD (Patient not taking: Reported on 10/19/2022)   lidocaine (XYLOCAINE) 5 % ointment lidocaine 5 % topical ointment-transparent dressing 6 cm X 7 cm kit (Patient not taking: Reported on 10/19/2022)   linagliptin (TRADJENTA) 5 MG TABS tablet Take 1 tablet (5 mg total) by mouth daily.   lisinopril (ZESTRIL) 20 MG tablet TAKE 1 TABLET BY MOUTH DAILY   metFORMIN (GLUCOPHAGE) 1000 MG tablet TAKE 1 TABLET BY MOUTH TWICE DAILY WITH A MEAL  nicotine (NICODERM CQ - DOSED IN MG/24 HOURS) 14 mg/24hr patch Place 1 patch (14 mg total) onto the skin daily. (Patient not taking: Reported on 01/18/2023)   omega-3 acid ethyl esters (LOVAZA) 1 g capsule Take 2 g by mouth 2 (two) times daily.   [DISCONTINUED] furosemide (LASIX) 10 MG/ML solution Take by mouth daily.   [DISCONTINUED] mesalamine (PENTASA) 250 MG CR capsule Take 1,000 mg by mouth 4 (four) times daily.   [DISCONTINUED] ONGLYZA 5 MG TABS tablet TAKE 1 TABLET (5 MG TOTAL) BY MOUTH DAILY. (Patient not taking: Reported on 06/03/2020)    [DISCONTINUED] pioglitazone (ACTOS) 15 MG tablet Take by mouth daily.   No facility-administered encounter medications on file as of 01/27/2023.    Allergies (verified) Trazodone and nefazodone   History: Past Medical History:  Diagnosis Date   Crohn disease ([place]) 2008   with hospital admission in 2011, and 8/12 for flare up and SBO relieved with bowel rest. no suregery, no meds for CD   Diabetes mellitus type II    Hypertension    Past Surgical History:  Procedure Laterality Date   BOWEL RESECTION  01/25/2012   Procedure: SMALL BOWEL RESECTION;  Surgeon: 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, MD;  Location: [facility];  Service: General;  Laterality: N/A;   SCROTAL SURGERY  ~ 69   "took out a pellet"   Family History  Problem Relation Age of Onset   Cancer Mother        lung   Diabetes Mother    Alopecia Mother    Coronary artery disease Mother    Diabetes Sister    Hyperlipidemia Sister    Hypertension Sister    Angina Brother    Hepatitis Brother    Alcohol abuse Brother    Diabetes Brother    Colon cancer Paternal Grandmother    Esophageal cancer Neg Hx    Stomach cancer Neg Hx    Rectal cancer Neg Hx    Social History   Socioeconomic History   Marital status: Single    Spouse name: Not on file   Number of children: 0   Years of education: Not on file   Highest education level: Not on file  Occupational History   Not on file  Tobacco Use   Smoking status: Every Day    Packs/day: 0.25    Years: 30.00    Additional pack years: 0.00    Total pack years: 7.50    Types: Cigarettes   Smokeless tobacco: Former    Types: Chew    Quit date: 01/20/1997  Vaping Use   Vaping Use: Never used  Substance and Sexual Activity   Alcohol use: Not Currently   Drug use: No   Sexual  activity: Not Currently  Other Topics Concern   Not on file  Social History Narrative   Lives in [place].Is single. Has a sister in [name]. He has a twin brother that passed away from autoimmune hepatitis.  Smokes 2-3 cigarettes a day. Drinks beer once every few months. No history of drug abuse. Studied up to 12th grade. Has orange card. No health insurance. Currently employed cleaning buildings.   Social Determinants of Health   Financial Resource Strain: Low Risk  (01/27/2023)   Overall Financial Resource Strain (CARDIA)  Difficulty of Paying Living Expenses: Not hard at all  Food Insecurity: No Food Insecurity (01/27/2023)   Hunger Vital Sign    Worried About Running Out of Food in the Last Year: Never true    Ran Out of Food in the Last Year: Never true  Transportation Needs: No Transportation Needs (01/27/2023)   PRAPARE - Hydrologist (Medical): No    Lack of Transportation (Non-Medical): No  Physical Activity: Insufficiently Active (01/27/2023)   Exercise Vital Sign    Days of Exercise per Week: 2 days    Minutes of Exercise per Session: 20 min  Stress: No Stress Concern Present (01/27/2023)   [place]    Feeling of Stress : Not at all  Social Connections: Socially Isolated (01/27/2023)   Social Connection and Isolation Panel [NHANES]    Frequency of Communication with Friends and Family: More than three times a week    Frequency of Social Gatherings with Friends and Family: More than three times a week    Attends Religious Services: Never    Marine scientist or Organizations: No    Attends Music therapist: Never    Marital Status: Never married    Tobacco Counseling Ready to quit: Yes Counseling given: Yes   Clinical Intake:  Pre-visit preparation completed: No  Pain : No/denies pain     BMI - recorded: 27.35 Nutritional Status: BMI 25 -29  Overweight Nutritional Risks: None Diabetes: No  How often do you need to have someone help you when you read instructions, pamphlets, or other written materials from your doctor or pharmacy?: 1 - Never  Diabetic? Yes  Interpreter Needed?: No Nutrition Risk Assessment:  Has the patient had any N/V/D within the last 2 months?  No  Does the patient have any non-healing wounds?  No  Has the patient had any unintentional weight loss or weight gain?  No   Diabetes:  Is the patient diabetic?  Yes  If diabetic, was a CBG obtained today?  No  Did the patient bring in their glucometer from home?  No  How often do you monitor your CBG's? Every other day.   Financial Strains and Diabetes Management:  Are you having any financial strains with the device, your supplies or your medication? No .  Does the patient want to be seen by Chronic Care Management for management of their diabetes?  No  Would the patient like to be referred to a Nutritionist or for Diabetic Management?  No   Diabetic Exams:  Diabetic Eye Exam: Completed Yes. Overdue for diabetic eye exam. Pt has been advised about the importance in completing this exam. A referral has been placed today. Message sent to referral coordinator for scheduling purposes. Advised pt to expect a call from office referred to regarding appt.  Diabetic Foot Exam: Completed Yes. Pt has been advised about the importance in completing this exam. Pt is scheduled for diabetic foot exam on Followed by PCP.   Information entered by :: Rolene Arbour LPN   Activities of Daily Living    01/27/2023   10:23 AM  In your present state of health, do you have any difficulty performing the following activities:  Hearing? 0  Vision? 0  Difficulty concentrating or making decisions? 0  Walking or climbing stairs? 0  Dressing or bathing? 0  Doing errands, shopping? 0  Preparing Food and eating ? N  Using the Toilet?  N  In the past six months, have you  accidently leaked urine? N  Do you have problems with loss of bowel control? Y  Comment Dx Crohn Disease  Managing your Medications? N  Managing your Finances? N  Housekeeping or managing your Housekeeping? N    Patient Care Team: Dorena Dew, FNP as PCP - General (Family Medicine)  Indicate any recent Medical Services you may have received from other than [facility] providers in the past year (date may be approximate).     Assessment:   This is a routine wellness examination for [place].  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       Depression Screen    01/27/2023   10:22 AM 01/18/2023   10:53 AM 10/19/2022   11:16 AM 07/20/2022   10:56 AM 03/30/2022   10:37 AM 12/29/2021   10:26 AM 06/03/2020    8:44 AM  PHQ 2/9 Scores  PHQ - 2 Score 0 0 0 0 0 0 0    Fall Risk    01/27/2023   10:25 AM 07/20/2022   10:56 AM 03/30/2022   10:36 AM 12/29/2021   10:20 AM 06/03/2020    8:44 AM  Fall Risk   Falls in the past year? 0 0 0 0 0  Number falls in past yr: 0 0 0    Injury with Fall? 0 0 0    Risk for fall due to : No Fall Risks  No Fall Risks    Follow up Falls prevention discussed  Falls evaluation completed      FALL RISK PREVENTION PERTAINING TO THE HOME:  Any stairs in or around the home? No If so, are there any without handrails? No  Home free of loose throw rugs in walkways, pet beds, electrical cords, etc? Yes  Adequate  lighting in your home to reduce risk of falls? Yes   ASSISTIVE DEVICES UTILIZED TO PREVENT FALLS:  Life alert? No  Use of a cane, walker or w/c? No  Grab bars in the bathroom? No  Shower chair or bench in shower? No  Elevated toilet seat or a handicapped toilet? Yes   TIMED UP AND GO:  Was the test performed? No . Audio Visit   Cognitive Function:        01/27/2023   10:27 AM  6CIT Screen  What Year? 0 points  What month? 0 points  What time? 0 points  Count back from 20 0 points  Months in reverse 0 points  Repeat phrase 0 points  Total Score 0 points    Immunizations Immunization History  Administered Date(s) Administered   PFIZER(Purple Top)SARS-COV-2 Vaccination 12/01/2020   PPD Test 01/24/2012   Pneumococcal Polysaccharide-23 06/27/2014   Tdap 06/27/2014, 04/14/2020    TDAP status: Up to date  Flu Vaccine status: Declined, Education has been provided regarding the importance of this vaccine but patient still  declined. Advised may receive this vaccine at local pharmacy or Health Dept. Aware to provide a copy of the vaccination record if obtained from local pharmacy or Health Dept. Verbalized acceptance and understanding.    Covid-19 vaccine status: Completed vaccines  Qualifies for Shingles Vaccine? Yes   Zostavax completed No   Shingrix Completed?: No.    Education has been provided regarding the importance of this vaccine. Patient has been advised to call insurance company to determine out of pocket expense if they have not yet received this vaccine. Advised may also receive vaccine at local pharmacy or Health Dept. Verbalized acceptance and understanding.  Screening Tests Health Maintenance  Topic Date Due   OPHTHALMOLOGY EXAM  10/06/2019   COVID-19 Vaccine (2 - 2023-24 season) 02/12/2023 (Originally 07/16/2022)   INFLUENZA VACCINE  02/13/2023 (Originally 06/15/2022)   Zoster Vaccines- Shingrix (1 of 2) 04/29/2023 (Originally 02/14/2016)   HEMOGLOBIN A1C   07/21/2023   Diabetic kidney evaluation - Urine ACR  10/20/2023   Diabetic kidney evaluation - eGFR measurement  01/18/2024   FOOT EXAM  01/18/2024   Medicare Annual Wellness (AWV)  01/27/2024   COLONOSCOPY (Pts 45-60yr Insurance coverage will need to be confirmed)  01/16/2029   DTaP/Tdap/Td (3 - Td or Tdap) 04/14/2030   Hepatitis C Screening  Completed   HIV Screening  Completed   HPV VACCINES  Aged Out    Health Maintenance  Health Maintenance Due  Topic Date Due   OPHTHALMOLOGY EXAM  10/06/2019    Colorectal cancer screening: Type of screening: Colonoscopy. Completed 01/17/19. Repeat every 10 years  Lung Cancer Screening: (Low Dose CT Chest recommended if Age 57-80years, 30 pack-year currently smoking OR have quit w/in 15years.) does qualify.   Lung Cancer Screening Referral: Deferred  Additional Screening:  Hepatitis C Screening: does qualify; Completed 04/09/20  Vision Screening: Recommended annual ophthalmology exams for early detection of glaucoma and other disorders of the eye. Is the patient up to date with their annual eye exam?  Yes  Who is the provider or what is the name of the office in which the patient attends annual eye exams? Dr HHerbert DeanerIf pt is not established with a provider, would they like to be referred to a provider to establish care? No .   Dental Screening: Recommended annual dental exams for proper oral hygiene  Community Resource Referral / Chronic Care Management:  CRR required this visit?  No   CCM required this visit?  No      Plan:     I have personally reviewed and noted the following in the patient's chart:   Medical and social history Use of alcohol, tobacco or illicit drugs  Current medications and supplements including opioid prescriptions. Patient is not currently taking opioid prescriptions. Functional ability and status Nutritional status Physical activity Advanced directives List of other physicians Hospitalizations,  surgeries, and ER visits in previous 12 months Vitals Screenings to include cognitive, depression, and falls Referrals and appointments  In addition, I have reviewed and discussed with patient certain preventive protocols, quality metrics, and best practice recommendations. A written personalized care plan for preventive services as well as general preventive health recommendations were provided to patient.     BCriselda Peaches LPN   [id number]  Nurse Notes: None

## 2023-02-03 ENCOUNTER — Other Ambulatory Visit: Payer: Self-pay | Admitting: Family Medicine

## 2023-02-03 DIAGNOSIS — E1140 Type 2 diabetes mellitus with diabetic neuropathy, unspecified: Secondary | ICD-10-CM

## 2023-02-18 ENCOUNTER — Other Ambulatory Visit: Payer: Self-pay | Admitting: Family Medicine

## 2023-02-18 DIAGNOSIS — E1140 Type 2 diabetes mellitus with diabetic neuropathy, unspecified: Secondary | ICD-10-CM

## 2023-03-03 ENCOUNTER — Emergency Department (HOSPITAL_COMMUNITY)
Admission: EM | Admit: 2023-03-03 | Discharge: 2023-03-03 | Disposition: A | Discharge status: Home / Self Care | Payer: 59 | Attending: Emergency Medicine | Admitting: Emergency Medicine

## 2023-03-03 ENCOUNTER — Other Ambulatory Visit: Payer: Self-pay

## 2023-03-03 ENCOUNTER — Encounter (HOSPITAL_COMMUNITY): Payer: Self-pay

## 2023-03-03 ENCOUNTER — Emergency Department (HOSPITAL_COMMUNITY): Payer: 59

## 2023-03-03 DIAGNOSIS — S63592A Other specified sprain of left wrist, initial encounter: Secondary | ICD-10-CM | POA: Diagnosis not present

## 2023-03-03 DIAGNOSIS — X500XXA Overexertion from strenuous movement or load, initial encounter: Secondary | ICD-10-CM | POA: Diagnosis not present

## 2023-03-03 DIAGNOSIS — S63502A Unspecified sprain of left wrist, initial encounter: Secondary | ICD-10-CM | POA: Diagnosis not present

## 2023-03-03 DIAGNOSIS — M25532 Pain in left wrist: Secondary | ICD-10-CM | POA: Diagnosis present

## 2023-03-03 DIAGNOSIS — M19032 Primary osteoarthritis, left wrist: Secondary | ICD-10-CM | POA: Diagnosis not present

## 2023-03-03 NOTE — Discharge Instructions (Signed)
Return if any problems,

## 2023-03-03 NOTE — ED Triage Notes (Signed)
Patient reports left hand/wrist pain for a couple hours. States he was lifting a box and then experienced a sharp pain. No deformity noted.

## 2023-03-03 NOTE — ED Provider Notes (Signed)
[place] EMERGENCY DEPARTMENT AT [facility] Provider Note   CSN: [id number] Arrival date & time: 03/03/23  2029     History  Chief Complaint  Patient presents with   Hand Pain    Jared [name] is a 57 y.o. male.  Pt complains of pain in his left wrist. Pt reports he was lifting a box and began having pain  The history is provided by the patient. No language interpreter was used.  Hand Pain This is a new problem. The current episode started 12 to 24 hours ago. The problem occurs constantly. The problem has not changed since onset.Nothing aggravates the symptoms. Nothing relieves the symptoms. He has tried nothing for the symptoms.       Home Medications Prior to Admission medications   Medication Sig Start Date End Date Taking? Authorizing Provider  Adalimumab (HUMIRA PEN) 40 MG/0.4ML PNKT Inject 40 mg into the skin every 14 (fourteen) days. Patient not taking: Reported on 08/12/2022 09/16/20   Tressia Danas, MD  Adalimumab (HUMIRA PEN) 40 MG/0.4ML PNKT Inject 1 Pen into the skin every 14 (fourteen) days. 08/24/22   Tressia Danas, MD  Adalimumab (HUMIRA PEN-CD/UC/HS STARTER) 40 MG/0.8ML PNKT Day 1:  Inject 160 mg subcutaneous  Day 15: Inject 80 mg subcutaneous 08/24/22   Tressia Danas, MD  atorvastatin (LIPITOR) 10 MG tablet TAKE 1 TABLET BY MOUTH DAILY 05/17/22   Massie Maroon, FNP  Blood Glucose Monitoring Suppl (TRUE METRIX METER) w/Device KIT 1 each by Does not apply route 4 (four) times daily -  before meals and at bedtime. 06/21/19   Mike Gip, FNP  glucose blood (TRUE METRIX BLOOD GLUCOSE TEST) test strip Use as instructed 07/06/17   Massie Maroon, FNP  HUMIRA PEN-CD/UC/HS STARTER 40 MG/0.8ML PNKT DAY 1:  INJECT 160 MG SUBCUTANEOUS  DAY 15: INJECT 80 MG SUBCUTANEOUS 08/26/22   Tressia Danas, MD  hydrOXYzine (ATARAX) 10 MG tablet TAKE 1 TABLET (10 MG TOTAL) BY MOUTH 3 (THREE) TIMES DAILY AS NEEDED. 03/30/22 03/30/23  Massie Maroon, FNP   lidocaine (LIDODERM) 5 % Place 1 patch onto the skin daily. Remove & Discard patch within 12 hours or as directed by MD Patient not taking: Reported on 10/19/2022 02/22/22   Henderly, Britni A, PA-C  lidocaine (XYLOCAINE) 5 % ointment lidocaine 5 % topical ointment-transparent dressing 6 cm X 7 cm kit Patient not taking: Reported on 10/19/2022 11/07/21   [provider]  linagliptin (TRADJENTA) 5 MG TABS tablet TAKE 1 TABLET BY MOUTH DAILY 02/18/23   Massie Maroon, FNP  lisinopril (ZESTRIL) 20 MG tablet TAKE 1 TABLET BY MOUTH DAILY 05/17/22   Massie Maroon, FNP  metFORMIN (GLUCOPHAGE) 1000 MG tablet TAKE 1 TABLET BY MOUTH TWICE DAILY WITH A MEAL 05/17/22   Massie Maroon, FNP  nicotine (NICODERM CQ - DOSED IN MG/24 HOURS) 14 mg/24hr patch Place 1 patch (14 mg total) onto the skin daily. Patient not taking: Reported on 01/18/2023 10/19/22   Massie Maroon, FNP  omega-3 acid ethyl esters (LOVAZA) 1 g capsule Take 2 g by mouth 2 (two) times daily. 01/15/21   [provider]  furosemide (LASIX) 10 MG/ML solution Take by mouth daily.  01/20/12  [provider]  mesalamine (PENTASA) 250 MG CR capsule Take 1,000 mg by mouth 4 (four) times daily.  01/20/12  [provider]  ONGLYZA 5 MG TABS tablet TAKE 1 TABLET (5 MG TOTAL) BY MOUTH DAILY. Patient not taking: Reported on 06/03/2020  06/02/20 06/03/20  Massie Maroon, FNP  pioglitazone (ACTOS) 15 MG tablet Take by mouth daily.  01/20/12  [provider]      Allergies    Trazodone and nefazodone    Review of Systems   Review of Systems  All other systems reviewed and are negative.   Physical Exam Updated Vital Signs BP (!) 147/88   Pulse 86   Temp 98.2 F (36.8 C) (Oral)   Resp 17   Ht  (1.88 m)   Wt 99.8 kg   SpO2 96%   BMI 28.25 kg/m  Physical Exam Vitals and nursing note reviewed.  Constitutional:      Appearance: He is well-developed.  HENT:     Head: Normocephalic.  Cardiovascular:      Rate and Rhythm: Normal rate.  Pulmonary:     Effort: Pulmonary effort is normal.  Abdominal:     General: There is no distension.  Musculoskeletal:        General: Normal range of motion.     Cervical back: Normal range of motion.  Skin:    General: Skin is warm.  Neurological:     General: No focal deficit present.     Mental Status: He is alert and oriented to person, place, and time.     ED Results / Procedures / Treatments   Labs (all labs ordered are listed, but only abnormal results are displayed) Labs Reviewed - No data to display  EKG None  Radiology DG Wrist Complete Left  Result Date: 03/03/2023 CLINICAL DATA:  Pain. No known injury. EXAM: LEFT WRIST - COMPLETE 3+ VIEW COMPARISON:  None Available. FINDINGS: Slight radiocarpal joint space narrowing. Minimal spurring of the radial styloid. Probable subchondral cyst about the radial styloid. No fracture. No erosions or periostitis. No bony destruction. Unremarkable soft tissues. IMPRESSION: Minimal degenerative change of the wrist. No acute findings. Electronically Signed   By: Narda Rutherford M.D.   On: 03/03/2023 21:09    Procedures Procedures    Medications Ordered in ED Medications - No data to display  ED Course/ Medical Decision Making/ A&P                             Medical Decision Making Complains of pain in his left wrist after struggling with lifting a box.  Patient reports wrist is swollen  Amount and/or Complexity of Data Reviewed Radiology: ordered and independent interpretation performed. Decision-making details documented in ED Course.    Details: X-ray left wrist shows degenerative changes no acute injury  Risk Risk Details: Patient counseled on wrist sprains he is placed in a wrist splint.  Patient is advised to take Tylenol he is given the phone number for Dr. Merlyn Lot orthopedic hand surgeon to follow-up with if any problems.           Final Clinical Impression(s) / ED  Diagnoses Final diagnoses:  Wrist sprain, left, initial encounter    Rx / DC Orders ED Discharge Orders     None      An After Visit Summary was printed and given to the patient.    Elson Areas, Cordelia Poche 03/03/23 2225    Glyn Ade, MD 03/04/23 1454

## 2023-03-07 ENCOUNTER — Telehealth: Payer: Self-pay

## 2023-03-07 NOTE — Telephone Encounter (Signed)
        Patient  visited [place] on 4/18 Telephone encounter attempt : 1st   A HIPAA compliant voice message was left requesting a return call.  Instructed patient to call back      [facility] Guide, [place] [phone] [address], [place],  [address] Phone: [phone] Email: [email or web address]       

## 2023-03-08 ENCOUNTER — Telehealth: Payer: Self-pay

## 2023-03-08 NOTE — Telephone Encounter (Signed)
        Patient  visited [place] on 4/18     Telephone encounter attempt :  2nd  A HIPAA compliant voice message was left requesting a return call.  Instructed patient to call back     Lenard Forth [facility] Guide, [facility] [phone] [address] [place], [place], [place] [address] Phone: [phone] Email: [email or web address]

## 2023-03-28 ENCOUNTER — Other Ambulatory Visit: Payer: Self-pay

## 2023-03-28 ENCOUNTER — Emergency Department (HOSPITAL_COMMUNITY)
Admission: EM | Admit: 2023-03-28 | Discharge: 2023-03-28 | Disposition: A | Discharge status: Home / Self Care | Payer: 59 | Attending: Emergency Medicine | Admitting: Emergency Medicine

## 2023-03-28 DIAGNOSIS — G8929 Other chronic pain: Secondary | ICD-10-CM | POA: Insufficient documentation

## 2023-03-28 DIAGNOSIS — M25512 Pain in left shoulder: Secondary | ICD-10-CM | POA: Diagnosis not present

## 2023-03-28 MED ORDER — METHOCARBAMOL 500 MG PO TABS
500.0000 mg | ORAL_TABLET | Freq: Four times a day (QID) | ORAL | 0 refills | Status: DC
  Filled 2023-03-28: qty 20, 5d supply, fill #0

## 2023-03-28 MED ORDER — KETOROLAC TROMETHAMINE 30 MG/ML IJ SOLN
30.0000 mg | Freq: Once | INTRAMUSCULAR | Status: AC
  Administered 2023-03-28: 30 mg via INTRAVENOUS
  Filled 2023-03-28: qty 1

## 2023-03-28 MED ORDER — DICLOFENAC SODIUM 75 MG PO TBEC
75.0000 mg | DELAYED_RELEASE_TABLET | Freq: Two times a day (BID) | ORAL | 0 refills | Status: DC
  Filled 2023-03-28: qty 20, 10d supply, fill #0

## 2023-03-28 NOTE — ED Triage Notes (Signed)
Pt reports left shoulder pain x few days.

## 2023-03-28 NOTE — ED Provider Notes (Signed)
[place] EMERGENCY DEPARTMENT AT [facility] Provider Note   CSN: [id number] Arrival date & time: 03/28/23  1255     History  Chief Complaint  Patient presents with   Shoulder Pain    Jared [name] is a 57 y.o. male.  Patient complains of pain in his left shoulder.  Patient that he has a spur in his left shoulder.  He was supposed to have surgery a year ago however he developed shingles.  He had to cancel his surgery and has not rescheduled to see his orthopedist.  Patient reports that the orthopedist gave him an injection in his shoulder in the past that helped for an extended period of time.  Patient reports he began having more pain this week.  It is an insulin-dependent diabetic.  Patient has difficulty moving his left shoulder.  He has not had any new injuries  The history is provided by the patient. A language interpreter was used.  Shoulder Pain      Home Medications Prior to Admission medications   Medication Sig Start Date End Date Taking? Authorizing Provider  diclofenac (VOLTAREN) 75 MG EC tablet Take 1 tablet (75 mg total) by mouth 2 (two) times daily. 03/28/23  Yes Cheron Schaumann K, PA-C  methocarbamol (ROBAXIN) 500 MG tablet Take 1 tablet (500 mg total) by mouth 4 (four) times daily. 03/28/23  Yes Cheron Schaumann K, PA-C  Adalimumab (HUMIRA PEN) 40 MG/0.4ML PNKT Inject 40 mg into the skin every 14 (fourteen) days. Patient not taking: Reported on 08/12/2022 09/16/20   Tressia Danas, MD  Adalimumab (HUMIRA PEN) 40 MG/0.4ML PNKT Inject 1 Pen into the skin every 14 (fourteen) days. 08/24/22   Tressia Danas, MD  Adalimumab (HUMIRA PEN-CD/UC/HS STARTER) 40 MG/0.8ML PNKT Day 1:  Inject 160 mg subcutaneous  Day 15: Inject 80 mg subcutaneous 08/24/22   Tressia Danas, MD  atorvastatin (LIPITOR) 10 MG tablet TAKE 1 TABLET BY MOUTH DAILY 05/17/22   Massie Maroon, FNP  Blood Glucose Monitoring Suppl (TRUE METRIX METER) w/Device KIT 1 each by Does not apply  route 4 (four) times daily -  before meals and at bedtime. 06/21/19   Mike Gip, FNP  glucose blood (TRUE METRIX BLOOD GLUCOSE TEST) test strip Use as instructed 07/06/17   Massie Maroon, FNP  HUMIRA PEN-CD/UC/HS STARTER 40 MG/0.8ML PNKT DAY 1:  INJECT 160 MG SUBCUTANEOUS  DAY 15: INJECT 80 MG SUBCUTANEOUS 08/26/22   Tressia Danas, MD  hydrOXYzine (ATARAX) 10 MG tablet TAKE 1 TABLET (10 MG TOTAL) BY MOUTH 3 (THREE) TIMES DAILY AS NEEDED. 03/30/22 03/30/23  Massie Maroon, FNP  linagliptin (TRADJENTA) 5 MG TABS tablet TAKE 1 TABLET BY MOUTH DAILY 02/18/23   Massie Maroon, FNP  lisinopril (ZESTRIL) 20 MG tablet TAKE 1 TABLET BY MOUTH DAILY 05/17/22   Massie Maroon, FNP  metFORMIN (GLUCOPHAGE) 1000 MG tablet TAKE 1 TABLET BY MOUTH TWICE DAILY WITH A MEAL 05/17/22   Massie Maroon, FNP  omega-3 acid ethyl esters (LOVAZA) 1 g capsule Take 2 g by mouth 2 (two) times daily. 01/15/21   [provider]  furosemide (LASIX) 10 MG/ML solution Take by mouth daily.  01/20/12  [provider]  mesalamine (PENTASA) 250 MG CR capsule Take 1,000 mg by mouth 4 (four) times daily.  01/20/12  [provider]  ONGLYZA 5 MG TABS tablet TAKE 1 TABLET (5 MG TOTAL) BY MOUTH DAILY. Patient not taking: Reported on 06/03/2020 06/02/20 06/03/20  Massie Maroon,  FNP  pioglitazone (ACTOS) 15 MG tablet Take by mouth daily.  01/20/12  [provider]      Allergies    Trazodone and nefazodone    Review of Systems   Review of Systems  All other systems reviewed and are negative.   Physical Exam Updated Vital Signs BP 139/83 (BP Location: Right Arm)   Pulse 79   Temp 98.6 F (37 C) (Oral)   Resp 16   Ht 6\' 2"  (1.88 m)   Wt 99.8 kg   SpO2 99%   BMI 28.25 kg/m  Physical Exam Vitals and nursing note reviewed.  Constitutional:      Appearance: He is well-developed.  HENT:     Head: Normocephalic.  Cardiovascular:     Rate and Rhythm: Normal rate.  Pulmonary:      Effort: Pulmonary effort is normal.  Abdominal:     General: There is no distension.  Musculoskeletal:        General: No swelling or tenderness.     Cervical back: Normal range of motion.     Comments: Decreased range of motion left shoulder neurovascular neurosensory intact  Skin:    General: Skin is warm.  Neurological:     General: No focal deficit present.     Mental Status: He is alert and oriented to person, place, and time.     ED Results / Procedures / Treatments   Labs (all labs ordered are listed, but only abnormal results are displayed) Labs Reviewed - No data to display  EKG None  Radiology No results found.  Procedures Procedures    Medications Ordered in ED Medications  ketorolac (TORADOL) 30 MG/ML injection 30 mg (30 mg Intravenous Given 03/28/23 1405)    ED Course/ Medical Decision Making/ A&P                             Medical Decision Making Patient reports he has a bone spur in his left shoulder.  Patient states he was supposed to have surgery a year ago  Amount and/or Complexity of Data Reviewed External Data Reviewed: notes.    Details: [facility] orthopedist notes reviewed  Risk Prescription drug management. Risk Details: Does patient he should schedule to see Dr. Devonne Doughty for reevaluation.  Patient is given a prescription for Voltaren and Robaxin.  He is advised to return if any problems           Final Clinical Impression(s) / ED Diagnoses Final diagnoses:  Chronic left shoulder pain    Rx / DC Orders ED Discharge Orders          Ordered    diclofenac (VOLTAREN) 75 MG EC tablet  2 times daily        03/28/23 1403    methocarbamol (ROBAXIN) 500 MG tablet  4 times daily        03/28/23 1403           An After Visit Summary was printed and given to the patient.    Elson Areas, [place] 03/28/23 1716    Ernie Avena, MD 03/28/23 [phone]

## 2023-04-01 ENCOUNTER — Other Ambulatory Visit: Payer: Self-pay

## 2023-04-04 ENCOUNTER — Other Ambulatory Visit: Payer: Self-pay | Admitting: Family Medicine

## 2023-04-04 ENCOUNTER — Telehealth: Payer: Self-pay | Admitting: Family Medicine

## 2023-04-04 DIAGNOSIS — E1140 Type 2 diabetes mellitus with diabetic neuropathy, unspecified: Secondary | ICD-10-CM

## 2023-04-04 DIAGNOSIS — I10 Essential (primary) hypertension: Secondary | ICD-10-CM

## 2023-04-04 NOTE — Telephone Encounter (Signed)
Caller & Relationship to patient:  MRN #  [id number]   Call Back Number:   Date of Last Office Visit: 02/18/2023     Date of Next Office Visit: 07/19/2023    Medication(s) to be Refilled: Ibuprofen refill for shoulder  Preferred Pharmacy:   ** Please notify patient to allow 48-72 hours to process** **Let patient know to contact pharmacy at the end of the day to make sure medication is ready. ** **If patient has not been seen in a year or longer, book an appointment **Advise to use MyChart for refill requests OR to contact their pharmacy

## 2023-04-07 NOTE — Telephone Encounter (Signed)
Called pt to find out no answer and could not leave voice mail. KH

## 2023-04-12 ENCOUNTER — Other Ambulatory Visit: Payer: Self-pay | Admitting: Nurse Practitioner

## 2023-04-12 ENCOUNTER — Other Ambulatory Visit: Payer: Self-pay

## 2023-04-12 DIAGNOSIS — M47812 Spondylosis without myelopathy or radiculopathy, cervical region: Secondary | ICD-10-CM

## 2023-04-12 MED ORDER — IBUPROFEN 600 MG PO TABS
600.0000 mg | ORAL_TABLET | Freq: Four times a day (QID) | ORAL | 0 refills | Status: DC | PRN
Start: 2023-04-12 — End: 2023-04-19
  Filled 2023-04-12: qty 20, 5d supply, fill #0

## 2023-04-16 ENCOUNTER — Inpatient Hospital Stay (HOSPITAL_COMMUNITY)
Admission: EM | Admit: 2023-04-16 | Discharge: 2023-04-19 | DRG: 387 | Disposition: A | Discharge status: Home / Self Care | Payer: 59 | Attending: Internal Medicine | Admitting: Internal Medicine

## 2023-04-16 ENCOUNTER — Other Ambulatory Visit: Payer: Self-pay

## 2023-04-16 ENCOUNTER — Emergency Department (HOSPITAL_COMMUNITY): Payer: 59

## 2023-04-16 ENCOUNTER — Encounter (HOSPITAL_COMMUNITY): Payer: Self-pay

## 2023-04-16 DIAGNOSIS — E782 Mixed hyperlipidemia: Secondary | ICD-10-CM | POA: Diagnosis present

## 2023-04-16 DIAGNOSIS — K50912 Crohn's disease, unspecified, with intestinal obstruction: Secondary | ICD-10-CM

## 2023-04-16 DIAGNOSIS — Z8249 Family history of ischemic heart disease and other diseases of the circulatory system: Secondary | ICD-10-CM

## 2023-04-16 DIAGNOSIS — Z89021 Acquired absence of right finger(s): Secondary | ICD-10-CM | POA: Diagnosis not present

## 2023-04-16 DIAGNOSIS — Z7984 Long term (current) use of oral hypoglycemic drugs: Secondary | ICD-10-CM

## 2023-04-16 DIAGNOSIS — Z885 Allergy status to narcotic agent status: Secondary | ICD-10-CM | POA: Diagnosis not present

## 2023-04-16 DIAGNOSIS — K5010 Crohn's disease of large intestine without complications: Principal | ICD-10-CM

## 2023-04-16 DIAGNOSIS — F17210 Nicotine dependence, cigarettes, uncomplicated: Secondary | ICD-10-CM | POA: Diagnosis present

## 2023-04-16 DIAGNOSIS — Z79899 Other long term (current) drug therapy: Secondary | ICD-10-CM

## 2023-04-16 DIAGNOSIS — Z833 Family history of diabetes mellitus: Secondary | ICD-10-CM

## 2023-04-16 DIAGNOSIS — Z811 Family history of alcohol abuse and dependence: Secondary | ICD-10-CM

## 2023-04-16 DIAGNOSIS — T40605A Adverse effect of unspecified narcotics, initial encounter: Secondary | ICD-10-CM | POA: Diagnosis present

## 2023-04-16 DIAGNOSIS — K565 Intestinal adhesions [bands], unspecified as to partial versus complete obstruction: Secondary | ICD-10-CM

## 2023-04-16 DIAGNOSIS — K50112 Crohn's disease of large intestine with intestinal obstruction: Secondary | ICD-10-CM | POA: Diagnosis present

## 2023-04-16 DIAGNOSIS — Z800 Family history of malignant neoplasm of digestive organs: Secondary | ICD-10-CM

## 2023-04-16 DIAGNOSIS — I10 Essential (primary) hypertension: Secondary | ICD-10-CM | POA: Diagnosis present

## 2023-04-16 DIAGNOSIS — Z9049 Acquired absence of other specified parts of digestive tract: Secondary | ICD-10-CM | POA: Diagnosis not present

## 2023-04-16 DIAGNOSIS — E1169 Type 2 diabetes mellitus with other specified complication: Secondary | ICD-10-CM | POA: Diagnosis present

## 2023-04-16 DIAGNOSIS — K5903 Drug induced constipation: Secondary | ICD-10-CM | POA: Diagnosis present

## 2023-04-16 DIAGNOSIS — E119 Type 2 diabetes mellitus without complications: Secondary | ICD-10-CM | POA: Diagnosis not present

## 2023-04-16 DIAGNOSIS — Z801 Family history of malignant neoplasm of trachea, bronchus and lung: Secondary | ICD-10-CM

## 2023-04-16 DIAGNOSIS — K5090 Crohn's disease, unspecified, without complications: Secondary | ICD-10-CM | POA: Diagnosis present

## 2023-04-16 DIAGNOSIS — Z79620 Long term (current) use of immunosuppressive biologic: Secondary | ICD-10-CM

## 2023-04-16 DIAGNOSIS — E785 Hyperlipidemia, unspecified: Secondary | ICD-10-CM | POA: Diagnosis present

## 2023-04-16 DIAGNOSIS — Z9889 Other specified postprocedural states: Secondary | ICD-10-CM

## 2023-04-16 DIAGNOSIS — Z83438 Family history of other disorder of lipoprotein metabolism and other lipidemia: Secondary | ICD-10-CM | POA: Diagnosis not present

## 2023-04-16 DIAGNOSIS — Z597 Insufficient social insurance and welfare support: Secondary | ICD-10-CM

## 2023-04-16 LAB — URINALYSIS, ROUTINE W REFLEX MICROSCOPIC
Bacteria, UA: NONE SEEN
Bilirubin Urine: NEGATIVE
Glucose, UA: 50 mg/dL — AB
Hgb urine dipstick: NEGATIVE
Ketones, ur: NEGATIVE mg/dL
Leukocytes,Ua: NEGATIVE
Nitrite: NEGATIVE
Protein, ur: 30 mg/dL — AB
Specific Gravity, Urine: 1.027 (ref 1.005–1.030)
pH: 5 (ref 5.0–8.0)

## 2023-04-16 LAB — CBC WITH DIFFERENTIAL/PLATELET
Abs Immature Granulocytes: 0.05 10*3/uL (ref 0.00–0.07)
Basophils Absolute: 0 10*3/uL (ref 0.0–0.1)
Basophils Relative: 0 %
Eosinophils Absolute: 0.3 10*3/uL (ref 0.0–0.5)
Eosinophils Relative: 2 %
HCT: 40.7 % (ref 39.0–52.0)
Hemoglobin: 12.8 g/dL — ABNORMAL LOW (ref 13.0–17.0)
Immature Granulocytes: 0 %
Lymphocytes Relative: 9 %
Lymphs Abs: 1.3 10*3/uL (ref 0.7–4.0)
MCH: 27.8 pg (ref 26.0–34.0)
MCHC: 31.4 g/dL (ref 30.0–36.0)
MCV: 88.5 fL (ref 80.0–100.0)
Monocytes Absolute: 1.6 10*3/uL — ABNORMAL HIGH (ref 0.1–1.0)
Monocytes Relative: 11 %
Neutro Abs: 11.4 10*3/uL — ABNORMAL HIGH (ref 1.7–7.7)
Neutrophils Relative %: 78 %
Platelets: 413 10*3/uL — ABNORMAL HIGH (ref 150–400)
RBC: 4.6 MIL/uL (ref 4.22–5.81)
RDW: 15.5 % (ref 11.5–15.5)
WBC: 14.6 10*3/uL — ABNORMAL HIGH (ref 4.0–10.5)
nRBC: 0 % (ref 0.0–0.2)

## 2023-04-16 LAB — COMPREHENSIVE METABOLIC PANEL
ALT: 19 U/L (ref 0–44)
AST: 17 U/L (ref 15–41)
Albumin: 3.6 g/dL (ref 3.5–5.0)
Alkaline Phosphatase: 74 U/L (ref 38–126)
Anion gap: 11 (ref 5–15)
BUN: 10 mg/dL (ref 6–20)
CO2: 24 mmol/L (ref 22–32)
Calcium: 8.8 mg/dL — ABNORMAL LOW (ref 8.9–10.3)
Chloride: 104 mmol/L (ref 98–111)
Creatinine, Ser: 0.99 mg/dL (ref 0.61–1.24)
GFR, Estimated: >60 mL/min (ref >60)
Glucose, Bld: 129 mg/dL — ABNORMAL HIGH (ref 70–99)
Potassium: 3.8 mmol/L (ref 3.5–5.1)
Sodium: 139 mmol/L (ref 135–145)
Total Bilirubin: 0.6 mg/dL (ref 0.3–1.2)
Total Protein: 8 g/dL (ref 6.5–8.1)

## 2023-04-16 LAB — LIPASE, BLOOD: Lipase: 36 U/L (ref 11–51)

## 2023-04-16 MED ORDER — INSULIN ASPART 100 UNIT/ML IJ SOLN
0.0000 [IU] | Freq: Three times a day (TID) | INTRAMUSCULAR | Status: DC
  Administered 2023-04-17: 2 [IU] via SUBCUTANEOUS
  Administered 2023-04-17: 1 [IU] via SUBCUTANEOUS
  Administered 2023-04-17: 5 [IU] via SUBCUTANEOUS
  Administered 2023-04-18: 3 [IU] via SUBCUTANEOUS
  Administered 2023-04-18 – 2023-04-19 (×3): 2 [IU] via SUBCUTANEOUS
  Filled 2023-04-16: qty 0.09

## 2023-04-16 MED ORDER — METHYLPREDNISOLONE SODIUM SUCC 40 MG IJ SOLR
20.0000 mg | Freq: Two times a day (BID) | INTRAMUSCULAR | Status: DC
  Administered 2023-04-17: 20 mg via INTRAVENOUS
  Filled 2023-04-16: qty 1

## 2023-04-16 MED ORDER — INSULIN ASPART 100 UNIT/ML IJ SOLN
0.0000 [IU] | Freq: Every day | INTRAMUSCULAR | Status: DC
  Administered 2023-04-18: 3 [IU] via SUBCUTANEOUS
  Filled 2023-04-16: qty 0.05

## 2023-04-16 MED ORDER — ONDANSETRON HCL 4 MG PO TABS: 4.0000 mg | ORAL_TABLET | Freq: Four times a day (QID) | ORAL | Status: DC | PRN

## 2023-04-16 MED ORDER — LACTATED RINGERS IV SOLN: INTRAVENOUS | Status: DC

## 2023-04-16 MED ORDER — IOHEXOL 300 MG/ML  SOLN
100.0000 mL | Freq: Once | INTRAMUSCULAR | Status: AC | PRN
  Administered 2023-04-16: 100 mL via INTRAVENOUS

## 2023-04-16 MED ORDER — HYDROMORPHONE HCL 1 MG/ML IJ SOLN
1.0000 mg | Freq: Once | INTRAMUSCULAR | Status: AC
  Administered 2023-04-17: 1 mg via INTRAVENOUS
  Filled 2023-04-16: qty 1

## 2023-04-16 MED ORDER — SODIUM CHLORIDE 0.9 % IV BOLUS: 1000.0000 mL | Freq: Once | INTRAVENOUS | Status: DC

## 2023-04-16 MED ORDER — HYDROMORPHONE HCL 1 MG/ML IJ SOLN: 0.5000 mg | Freq: Once | INTRAMUSCULAR | Status: DC

## 2023-04-16 MED ORDER — METHYLPREDNISOLONE SODIUM SUCC 40 MG IJ SOLR: 20.0000 mg | Freq: Two times a day (BID) | INTRAMUSCULAR | Status: DC

## 2023-04-16 MED ORDER — ONDANSETRON HCL 4 MG/2ML IJ SOLN
4.0000 mg | Freq: Four times a day (QID) | INTRAMUSCULAR | Status: DC | PRN
  Filled 2023-04-16: qty 2

## 2023-04-16 MED ORDER — HYDROMORPHONE HCL 1 MG/ML IJ SOLN
0.5000 mg | INTRAMUSCULAR | Status: DC | PRN
  Administered 2023-04-17 (×4): 0.5 mg via INTRAVENOUS
  Filled 2023-04-16 (×5): qty 0.5

## 2023-04-16 NOTE — ED Triage Notes (Signed)
Pt reports with lower abdominal pain that extends to the right side. Pt reports having Crohns. Pt also states that he has a sharp pain in his neck.

## 2023-04-16 NOTE — ED Provider Notes (Signed)
[place] EMERGENCY DEPARTMENT AT [facility] Provider Note   CSN: [id number] Arrival date & time: 04/16/23  2053     History  Chief Complaint  Patient presents with   Abdominal Pain    Jessiel Langlais is a 57 y.o. male with medical history of diabetes type 2, hypertension, Crohn's disease.  Patient presents to ED for evaluation abdominal pain.  Patient reports that this evening he was sitting watching TV when he developed abdominal pain that was located across his entire midsection and also radiating to the right side of his abdomen.  The patient reports a history of abdominal pain on the right side but states that today his abdominal pain is different because it is located more centrally.  The patient has history of Crohn's disease, he recently had an extensive amount of his intestines removed because of his Crohn's disease.  The patient reports he takes Humira.  He denies any nausea, vomiting, fevers, diarrhea, chest pain, shortness of breath.  He is also endorsing dysuria that began around the same time as his abdominal pain.  Denies history of kidney stones.   Abdominal Pain Associated symptoms: no chest pain, no constipation, no diarrhea, no dysuria, no fever, no nausea, no shortness of breath and no vomiting        Home Medications Prior to Admission medications   Medication Sig Start Date End Date Taking? Authorizing Provider  Adalimumab (HUMIRA PEN) 40 MG/0.4ML PNKT Inject 1 Pen into the skin every 14 (fourteen) days. Patient taking differently: Inject 40 mg into the skin every 14 (fourteen) days. 08/24/22  Yes Tressia Danas, MD  atorvastatin (LIPITOR) 10 MG tablet TAKE 1 TABLET BY MOUTH DAILY 04/06/23  Yes Massie Maroon, FNP  linagliptin (TRADJENTA) 5 MG TABS tablet TAKE 1 TABLET BY MOUTH DAILY 02/18/23  Yes Massie Maroon, FNP  lisinopril (ZESTRIL) 20 MG tablet TAKE 1 TABLET BY MOUTH DAILY 04/06/23  Yes Massie Maroon, FNP  metFORMIN (GLUCOPHAGE) 1000 MG  tablet TAKE ONE (1) TABLET BY MOUTH TWICE DAILY WITH A MEAL 04/06/23  Yes Massie Maroon, FNP  omega-3 acid ethyl esters (LOVAZA) 1 g capsule Take 2 g by mouth 2 (two) times daily. 01/15/21  Yes [provider]  Adalimumab (HUMIRA PEN) 40 MG/0.4ML PNKT Inject 40 mg into the skin every 14 (fourteen) days. Patient not taking: Reported on 08/12/2022 09/16/20   Tressia Danas, MD  Adalimumab [facility] PEN-CD/UC/HS STARTER) 40 MG/0.8ML PNKT Day 1:  Inject 160 mg subcutaneous  Day 15: Inject 80 mg subcutaneous Patient not taking: Reported on 04/16/2023 08/24/22   Tressia Danas, MD  Blood Glucose Monitoring Suppl (TRUE METRIX METER) w/Device KIT 1 each by Does not apply route 4 (four) times daily -  before meals and at bedtime. 06/21/19   Mike Gip, FNP  glucose blood (TRUE METRIX BLOOD GLUCOSE TEST) test strip Use as instructed 07/06/17   Massie Maroon, FNP  HUMIRA PEN-CD/UC/HS STARTER 40 MG/0.8ML PNKT DAY 1:  INJECT 160 MG SUBCUTANEOUS  DAY 15: INJECT 80 MG SUBCUTANEOUS Patient not taking: Reported on 04/16/2023 08/26/22   Tressia Danas, MD  ibuprofen (ADVIL) 600 MG tablet Take 1 tablet (600 mg total) by mouth every 6 (six) hours as needed. Patient not taking: Reported on 04/16/2023 04/12/23   Donell Beers, FNP  methocarbamol (ROBAXIN) 500 MG tablet Take 1 tablet (500 mg total) by mouth 4 (four) times daily. Patient not taking: Reported on 04/16/2023 03/28/23   Elson Areas, PA-C  furosemide (LASIX) 10 MG/ML solution Take by mouth daily.  01/20/12  [provider]  mesalamine (PENTASA) 250 MG CR capsule Take 1,000 mg by mouth 4 (four) times daily.  01/20/12  [provider]  ONGLYZA 5 MG TABS tablet TAKE 1 TABLET (5 MG TOTAL) BY MOUTH DAILY. Patient not taking: Reported on 06/03/2020 06/02/20 06/03/20  Massie Maroon, FNP  pioglitazone (ACTOS) 15 MG tablet Take by mouth daily.  01/20/12  [provider]      Allergies    Trazodone and nefazodone     Review of Systems   Review of Systems  Constitutional:  Negative for fever.  Respiratory:  Negative for shortness of breath.   Cardiovascular:  Negative for chest pain.  Gastrointestinal:  Positive for abdominal pain. Negative for constipation, diarrhea, nausea and vomiting.  Genitourinary:  Negative for dysuria.  All other systems reviewed and are negative.   Physical Exam Updated Vital Signs BP (!) 147/71   Pulse 96   Temp 98.4 F (36.9 C) (Oral)   Resp 18   Ht 6\' 2"  (1.88 m)   Wt 99.8 kg   SpO2 100%   BMI 28.25 kg/m  Physical Exam Vitals and nursing note reviewed.  Constitutional:      General: He is not in acute distress.    Appearance: He is not ill-appearing, toxic-appearing or diaphoretic.  HENT:     Head: Normocephalic and atraumatic.     Nose: Nose normal.     Mouth/Throat:     Mouth: Mucous membranes are moist.     Pharynx: Oropharynx is clear.  Eyes:     Extraocular Movements: Extraocular movements intact.     Conjunctiva/sclera: Conjunctivae normal.     Pupils: Pupils are equal, round, and reactive to light.  Cardiovascular:     Rate and Rhythm: Normal rate and regular rhythm.  Pulmonary:     Effort: Pulmonary effort is normal.     Breath sounds: Normal breath sounds. No wheezing.  Abdominal:     General: Abdomen is flat. Bowel sounds are normal. There is distension.     Palpations: Abdomen is soft.     Tenderness: There is abdominal tenderness. There is rebound.  Musculoskeletal:     Cervical back: Normal range of motion and neck supple. No tenderness.  Skin:    General: Skin is warm and dry.     Capillary Refill: Capillary refill takes less than 2 seconds.  Neurological:     Mental Status: He is alert and oriented to person, place, and time.     ED Results / Procedures / Treatments   Labs (all labs ordered are listed, but only abnormal results are displayed) Labs Reviewed  COMPREHENSIVE METABOLIC PANEL - Abnormal; Notable for the following  components:      Result Value   Glucose, Bld 129 (*)    Calcium 8.8 (*)    All other components within normal limits  CBC WITH DIFFERENTIAL/PLATELET - Abnormal; Notable for the following components:   WBC 14.6 (*)    Hemoglobin 12.8 (*)    Platelets 413 (*)    Neutro Abs 11.4 (*)    Monocytes Absolute 1.6 (*)    All other components within normal limits  URINALYSIS, ROUTINE W REFLEX MICROSCOPIC - Abnormal; Notable for the following components:   Glucose, UA 50 (*)    Protein, ur 30 (*)    All other components within normal limits  CALPROTECTIN, FECAL  LIPASE, BLOOD  SEDIMENTATION RATE  C-REACTIVE  PROTEIN  HIV ANTIBODY (ROUTINE TESTING W REFLEX)  CBC  COMPREHENSIVE METABOLIC PANEL    EKG None  Radiology CT ABDOMEN PELVIS W CONTRAST  Result Date: 04/16/2023 CLINICAL DATA:  Acute abdominal pain. Crohn's exacerbation. EXAM: CT ABDOMEN AND PELVIS WITH CONTRAST TECHNIQUE: Multidetector CT imaging of the abdomen and pelvis was performed using the standard protocol following bolus administration of intravenous contrast. RADIATION DOSE REDUCTION: This exam was performed according to the departmental dose-optimization program which includes automated exposure control, adjustment of the mA and/or kV according to patient size and/or use of iterative reconstruction technique. CONTRAST:  OMNIPAQUE IOHEXOL 300 MG/ML  SOLN COMPARISON:  Most recent CT 08/19/2022 FINDINGS: Lower chest: Bandlike scarring in the left lower lobe. No pleural effusion or acute airspace disease. Hepatobiliary: No focal liver abnormalities. Layering gallstones within physiologically distended gallbladder. No pericholecystic inflammation. No biliary dilatation or visible choledocholithiasis. Pancreas: Dilated proximal pancreatic duct at 6 mm. This is unchanged. No evidence of pancreatic mass or inflammation. Spleen: Normal in size without focal abnormality. Adrenals/Urinary Tract: Normal adrenal glands. No hydronephrosis  or perinephric edema. Homogeneous renal enhancement with symmetric excretion on delayed phase imaging. Punctate nonobstructing stones within the lower right and upper left kidneys. Left renal cyst needs no further imaging follow-up. Urinary bladder is physiologically distended without wall thickening. Stomach/Bowel: Detailed bowel assessment is limited in the absence of enteric contrast. 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 The stomach is unremarkable. Tethered appearance of small bowel in the right lower quadrant where there is associated small bowel wall thickening and perienteric fat stranding, for example series 2, image 55. Mildly dilated small bowel in the pelvis measuring up to 3.9 cm. There is mild mesenteric edema involving a bowel loop in the left abdomen, series 7, image 59. There is some questionable areas of wall thickening involving the proximal sigmoid colon, series 2, image 69, although this may be reactive. No other evidence of colonic inflammation. No bowel pneumatosis. Vascular/Lymphatic: Mesenteric adenopathy is typically reactive. No acute vascular finding. Aorto bi-iliac atherosclerosis. Reproductive: Prostate is unremarkable. Other: No free air or perforation. Generalized mesenteric stranding within the right and left abdomen in the areas of bowel inflammation. Trace areas of mesenteric and pelvic free fluid. No organized or drainable fluid collection. Musculoskeletal: There are no acute or suspicious osseous abnormalities. Thoracolumbar spondylosis with anterior spurring. IMPRESSION: 1. 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 Tethered appearance of small bowel in the right lower quadrant where there is associated small bowel wall thickening and perienteric fat stranding, consistent with active Crohn's disease. Additional inflamed small bowel in the left pelvis. No perforation or abscess. 2. Mildly dilated small bowel in the pelvis may be secondary to ileus or developing small bowel  obstruction. 3. Questionable areas of wall thickening involving the proximal sigmoid colon, although this may be reactive. 4. Cholelithiasis without gallbladder inflammation. 5. Bilateral nonobstructing nephrolithiasis. Aortic Atherosclerosis (ICD10-I70.0). Electronically Signed   By: Narda Rutherford M.D.   On: 04/16/2023 23:00    Procedures Procedures   Medications Ordered in ED Medications  methylPREDNISolone sodium succinate (SOLU-MEDROL) 40 mg/mL injection 20 mg (has no administration in time range)  HYDROmorphone (DILAUDID) injection 1 mg (has no administration in time range)  insulin aspart (novoLOG) injection 0-9 Units (has no administration in time range)  insulin aspart (novoLOG) injection 0-5 Units (has no administration in time range)  lactated ringers infusion (has no administration in time range)  HYDROmorphone (DILAUDID) injection 0.5 mg (has no administration in time range)  ondansetron (ZOFRAN) tablet 4 mg (has  no administration in time range)    Or  ondansetron (ZOFRAN) injection 4 mg (has no administration in time range)  methylPREDNISolone sodium succinate (SOLU-MEDROL) 40 mg/mL injection 20 mg (has no administration in time range)  sodium chloride 0.9 % bolus 1,000 mL (has no administration in time range)  iohexol (OMNIPAQUE) 300 MG/ML solution 100 mL (100 mLs Intravenous Contrast Given 04/16/23 2231)    ED Course/ Medical Decision Making/ A&P Clinical Course as of 04/16/23 2353  Sat Apr 16, 2023  2331 Spoke with [facility] who states 20mg  soluemedrol q12 hours, admit for observation, will consult in the morning. Having formed stools.  [CG]    Clinical Course User Index [CG] Al Decant, PA-C   Medical Decision Making Amount and/or Complexity of Data Reviewed Labs: ordered. Radiology: ordered.  Risk Prescription drug management. Decision regarding hospitalization.   57 year old male presents to the ED for evaluation of abdominal pain.  Please see  HPI for further details.  On examination the patient is afebrile and nontachycardic.  Lung sounds are clear bilaterally and he is not hypoxic.  His abdomen has tenderness throughout, it is distended, there is rebound on palpation.  No overlying skin change patient abdomen.  Neurological examination at baseline.  Patient CBC with leukocytosis to 14.6, no anemia.  CMP with glucose 129 however no electrolyte derangement, no elevated LFT, elevated creatinine.  Anion gap 11.  Lipase WNL.  Urinalysis unremarkable.  Patient CT scan of abdomen and pelvis shows mildly dilated small bowel in the pelvis that which may be secondary to an ileus or developing SBO.  Patient also seen to have changes associated with Crohn's disease.  Discussed patient with Dr. Meridee Score.  Dr. Meridee Score advises admitting the patient for observation and they will consult on him in the morning.  The patient is having formed stools so we will start him on 20 mg of Solu-Medrol every 12 hours.  Will also add on ESR and CRP at Dr. Elesa Hacker instructions.  Patient case discussed with attending Dr. Mikeal Hawthorne, [facility] hospitalist, who has agreed to admit patient for further management and workup.  Patient given 1 L fluid, 1 mg Dilaudid for pain control.  Patient stable at time of admission.   Final Clinical Impression(s) / ED Diagnoses Final diagnoses:  Crohn's disease of colon without complication (HCC)  Small bowel obstruction due to adhesions [facility])    Rx / DC Orders ED Discharge Orders     None         Al Decant, PA-C 04/16/23 2353    Melene Plan, DO 04/17/23 1459
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
## 2023-04-17 DIAGNOSIS — E119 Type 2 diabetes mellitus without complications: Secondary | ICD-10-CM | POA: Diagnosis not present

## 2023-04-17 DIAGNOSIS — I10 Essential (primary) hypertension: Secondary | ICD-10-CM | POA: Diagnosis not present

## 2023-04-17 DIAGNOSIS — E1169 Type 2 diabetes mellitus with other specified complication: Secondary | ICD-10-CM | POA: Diagnosis not present

## 2023-04-17 DIAGNOSIS — K50912 Crohn's disease, unspecified, with intestinal obstruction: Secondary | ICD-10-CM | POA: Diagnosis not present

## 2023-04-17 DIAGNOSIS — E782 Mixed hyperlipidemia: Secondary | ICD-10-CM

## 2023-04-17 LAB — COMPREHENSIVE METABOLIC PANEL
ALT: 17 U/L (ref 0–44)
AST: 14 U/L — ABNORMAL LOW (ref 15–41)
Albumin: 3 g/dL — ABNORMAL LOW (ref 3.5–5.0)
Alkaline Phosphatase: 63 U/L (ref 38–126)
Anion gap: 9 (ref 5–15)
BUN: 10 mg/dL (ref 6–20)
CO2: 24 mmol/L (ref 22–32)
Calcium: 8.4 mg/dL — ABNORMAL LOW (ref 8.9–10.3)
Chloride: 104 mmol/L (ref 98–111)
Creatinine, Ser: 0.96 mg/dL (ref 0.61–1.24)
GFR, Estimated: >60 mL/min (ref >60)
Glucose, Bld: 112 mg/dL — ABNORMAL HIGH (ref 70–99)
Potassium: 3.9 mmol/L (ref 3.5–5.1)
Sodium: 137 mmol/L (ref 135–145)
Total Bilirubin: 0.5 mg/dL (ref 0.3–1.2)
Total Protein: 6.9 g/dL (ref 6.5–8.1)

## 2023-04-17 LAB — HEPATITIS B SURFACE ANTIGEN: Hepatitis B Surface Ag: NONREACTIVE

## 2023-04-17 LAB — CBC
HCT: 38.2 % — ABNORMAL LOW (ref 39.0–52.0)
Hemoglobin: 12 g/dL — ABNORMAL LOW (ref 13.0–17.0)
MCH: 27.5 pg (ref 26.0–34.0)
MCHC: 31.4 g/dL (ref 30.0–36.0)
MCV: 87.4 fL (ref 80.0–100.0)
Platelets: 388 10*3/uL (ref 150–400)
RBC: 4.37 MIL/uL (ref 4.22–5.81)
RDW: 15.5 % (ref 11.5–15.5)
WBC: 12.7 10*3/uL — ABNORMAL HIGH (ref 4.0–10.5)
nRBC: 0 % (ref 0.0–0.2)

## 2023-04-17 LAB — GLUCOSE, CAPILLARY
Glucose-Capillary: 185 mg/dL — ABNORMAL HIGH (ref 70–99)
Glucose-Capillary: 144 mg/dL — ABNORMAL HIGH (ref 70–99)
Glucose-Capillary: 268 mg/dL — ABNORMAL HIGH (ref 70–99)
Glucose-Capillary: 166 mg/dL — ABNORMAL HIGH (ref 70–99)

## 2023-04-17 LAB — C-REACTIVE PROTEIN: CRP: 10.3 mg/dL — ABNORMAL HIGH (ref <1.0)

## 2023-04-17 LAB — CBG MONITORING, ED: Glucose-Capillary: 103 mg/dL — ABNORMAL HIGH (ref 70–99)

## 2023-04-17 LAB — SEDIMENTATION RATE: Sed Rate: 38 mm/hr — ABNORMAL HIGH (ref 0–16)

## 2023-04-17 LAB — HEPATITIS B CORE ANTIBODY, TOTAL: Hep B Core Total Ab: NONREACTIVE

## 2023-04-17 LAB — HEPATITIS B SURFACE ANTIBODY,QUALITATIVE: Hep B S Ab: NONREACTIVE

## 2023-04-17 MED ORDER — HYDRALAZINE HCL 20 MG/ML IJ SOLN: 10.0000 mg | Freq: Four times a day (QID) | INTRAMUSCULAR | Status: DC | PRN

## 2023-04-17 MED ORDER — METHYLPREDNISOLONE SODIUM SUCC 40 MG IJ SOLR
40.0000 mg | Freq: Two times a day (BID) | INTRAMUSCULAR | Status: DC
  Administered 2023-04-17 – 2023-04-18 (×4): 40 mg via INTRAVENOUS
  Filled 2023-04-17 (×4): qty 1

## 2023-04-17 MED ORDER — HYDROMORPHONE HCL 1 MG/ML IJ SOLN
0.5000 mg | INTRAMUSCULAR | Status: DC | PRN
  Administered 2023-04-17 – 2023-04-19 (×5): 0.5 mg via INTRAVENOUS
  Filled 2023-04-17 (×4): qty 0.5

## 2023-04-17 MED ORDER — HYDROMORPHONE HCL 1 MG/ML IJ SOLN: 1.0000 mg | INTRAMUSCULAR | Status: DC | PRN

## 2023-04-17 NOTE — Assessment & Plan Note (Signed)
Holding home regimen of oral antihypertensives As needed intravenous antihypertensives for markedly elevated blood pressure 

## 2023-04-17 NOTE — Assessment & Plan Note (Signed)
Holding statin therapy for now.  ?

## 2023-04-17 NOTE — Assessment & Plan Note (Signed)
Continue Solu-Medrol 40 mg twice daily.  GI recommends transitioning patient to oral steroids beginning tomorrow followed by taper. Advance to full liquid diet As needed opiate-based analgesics for associated pain Gastroenterology is following along-their input is appreciated.  Patient may need to be transitioned off of Humira to a different agent in the near future as well as need a referral to outpatient surgery.

## 2023-04-17 NOTE — Progress Notes (Addendum)
PROGRESS NOTE   Jared [name]  ZOX:[id number] DOB: 20-Jul-1966 DOA: 04/16/2023 PCP: Massie Maroon, FNP   Date of Service: the patient was seen and examined on 04/17/2023  Brief Narrative:  57 year old male with past medical history of diabetes mellitus type 2, hypertension, hyperlipidemia and Crohn's disease (on Humira, follows with [facility]) status post small bowel resection in 2013 presenting to [facility] emergency department with complaints of abdominal pain.  Upon evaluation in the emergency department CT imaging of the abdomen pelvis revealed small bowel wall thickening and surrounding fat stranding consistent with active disease with additional inflamed small bowel in the left pelvis without perforation or abscess.  Additionally, dilated small bowel loops were noted, possibly secondary to ileus or small bowel obstruction.  Case was discussed with Dr. Meridee Score with gastroenterology who recommended hospitalization on the medicine service and initiation of steroid therapy.     Assessment and Plan: * Acute Crohn's disease with intestinal obstruction (HCC) Continue Solu-Medrol 40 mg twice daily Clear liquid diet As needed opiate-based analgesics for associated pain Gastroenterology is following along-their input is appreciated.  Patient may need to be transitioned off of Humira to a different agent in the near future  Type 2 diabetes mellitus without complication, without long-term current use of insulin (HCC) Patient been placed on Accu-Cheks before every meal and nightly with sliding scale insulin Holding home regimen of hypoglycemics Hemoglobin A1C will be ordered   Essential hypertension Holding home regimen of oral antihypertensives As needed intravenous antihypertensives for markedly elevated blood pressure  Mixed diabetic hyperlipidemia associated with type 2 diabetes mellitus (HCC) Holding statin therapy for now      Subjective:  Patient continues to  complain of severe abdominal pain, sharp in quality, located in the right lower quadrant, radiating across the abdomen, worse with movement.  Physical Exam:  Vitals:   04/17/23 1038 04/17/23 1344 04/17/23 1802 04/17/23 2042  BP: (!) 138/54 (!) 130/57 132/70 121/63  Pulse: 72 67 73 67  Resp: 14 18 20 17   Temp: 98.4 F (36.9 C) 98.3 F (36.8 C) 98.2 F (36.8 C) 98 F (36.7 C)  TempSrc: Oral Oral Oral Oral  SpO2: 94% 95% 93% 96%  Weight:      Height:        Constitutional: Awake alert and oriented x3, patient is in mild distress due to pain. Skin: no rashes, no lesions, poor skin turgor noted. Eyes: Pupils are equally reactive to light.  No evidence of scleral icterus or conjunctival pallor.  ENMT: Dry mucous membranes noted.  Posterior pharynx clear of any exudate or lesions.   Respiratory: clear to auscultation bilaterally, no wheezing, no crackles. Normal respiratory effort. No accessory muscle use.  Cardiovascular: Regular rate and rhythm, no murmurs / rubs / gallops. No extremity edema. 2+ pedal pulses. No carotid bruits.  Abdomen: See Tenderness noted in the right lower quadrant and less so in the left lower quadrant.  No evidence of intra-abdominal masses.  Positive bowel sounds noted in all quadrants.   Musculoskeletal: No joint deformity upper and lower extremities. Good ROM, no contractures. Normal muscle tone.    Data Reviewed:  I have personally reviewed and interpreted labs, imaging.  Significant findings are   CBC: Recent Labs  Lab 04/16/23 2108 04/17/23 0101  WBC 14.6* 12.7*  NEUTROABS 11.4*  --   HGB 12.8* 12.0*  HCT 40.7 38.2*  MCV 88.5 87.4  PLT 413* 388   Basic Metabolic Panel: Recent Labs  Lab 04/16/23 2108  04/17/23 0101  NA 139 137  K 3.8 3.9  CL 104 104  CO2 24 24  GLUCOSE 129* 112*  BUN 10 10  CREATININE 0.99 0.96  CALCIUM 8.8* 8.4*   GFR: Estimated Creatinine Clearance: 107.1 mL/min (by C-G formula based on SCr of 0.96 mg/dL). Liver  Function Tests: Recent Labs  Lab 04/16/23 2108 04/17/23 0101  AST 17 14*  ALT 19 17  ALKPHOS 74 63  BILITOT 0.6 0.5  PROT 8.0 6.9  ALBUMIN 3.6 3.0*      Code Status:  Full code.  Code status decision has been confirmed with: patient   Severity of Illness:  The appropriate patient status for this patient is INPATIENT. Inpatient status is judged to be reasonable and necessary in order to provide the required intensity of service to ensure the patient's safety. The patient's presenting symptoms, physical exam findings, and initial radiographic and laboratory data in the context of their chronic comorbidities is felt to place them at high risk for further clinical deterioration. Furthermore, it is not anticipated that the patient will be medically stable for discharge from the hospital within 2 midnights of admission.   * I certify that at the point of admission it is my clinical judgment that the patient will require inpatient hospital care spanning beyond 2 midnights from the point of admission due to high intensity of service, high risk for further deterioration and high frequency of surveillance required.*  Time spent:  50 minutes  Author:  Marinda Elk MD  04/17/2023 10:04 PM

## 2023-04-17 NOTE — Consult Note (Signed)
Consultation  Referring Provider:     Dr. Leafy Half Primary Care Physician:  Massie Maroon, FNP Primary Gastroenterologist:       Dr.  Orvan Falconer Reason for Consultation:     Crohn's disease, small bowel obstruction         HPI:   Jared [name] is a 57 y.o. male with a history of complicated Crohn's disease status post small bowel resection/ileocecectomy who presented to the hospital with worsening abdominal pain and was found to have CT evidence of active Crohn's disease and small bowel obstruction.  He has been followed at [facility] by Dr. Orvan Falconer.  He has a history of a small bowel resection/ileocecectomy in 2013.  He had been on budesonide for a period in 2021 without response. He was started on Humira in January 2022, but discontinued it in April due to lack of health insurance.  He was seen in our clinic August 12, 2022 off meds since April 2022 and a CTE was performed which showed persistent ileal thickening and luminal narrowing as well as mild dilation of several loops of distal small bowel, concerning for low-grade obstruction.  He was given a course of steroids at that time and was restarted on Humira (September 21, 2022).  He was also referred to surgery but did not keep his appointment due to flulike symptoms.  Adalimumab levels were checked in December and were low at 0.8 mcg/mL, with low titer antibodies detected (97 ng/mL).  It was not clear if there were any recommendations to adjust dose or add immunomodulator at that time, based on my review of the EMR.  The patient denies being told to make any changes to his Humira.  The patient states that he has chronic mild right lower quadrant discomfort and abdominal bloating that had been at his baseline until yesterday morning when he developed abrupt onset severe abdominal pain.  This pain was generalized and was 10/10.  He denied any nausea or vomiting.  He ate shrimp fried rice yesterday morning around 7, which was the last time he has  tried eating anything.  His pain started a few hours after that.  His bowel movements are typically normal, with 1-2 formed stools daily.  Yesterday, he reports having 6 bowel movements, but they were all formed and solid.  No blood in the stool.  He denies any fevers or chills.  He does have generalized achiness and malaise.  He has been tolerating small sips of water since admission.  He denies missing any Humira injections, although he says that his a hassle to get his medications mailed to him now.  He has to order them every month, and sometimes the medications are not delivered on time.  His last colonoscopy was in 2020 and showed an ulcerated, stenotic ileocolonic anastomosis that was not traversable.   Prior Lab Summary: Fecal calprotectin 03/07/2020 was 211 and increased to 407 05/28/20.   Labs 03/04/2020 show hemoglobin A1c of 9.1 Labs 04/09/2020 showed iron 46, ferritin 10.1, CRP 6.180, ESR 57.  Screening for chronic hepatitis B and C was negative.  Quant interferon TB Gold negative.  Testing for EBV RSV and Covid negative.  He has antibodies to varicella. Labs 05/27/2020 showing iron of 47, ferritin 166.9 Labs 05/28/20: fecal calprotectin 407 Labs 09/02/20: normal CMP except for glucose 193 Labs 07/20/22: normal CMP except for glucose 186 Labs 08/13/22: GI pathogen panel positive for Enteropathogenic E coli, fecal calprotectin 1680  11/01/2022: Adalimumab level 0.8 mcg/mL, antibodies 97 ng/mL  25 - 100 ng/mL: LOW titer      101 - 300 ng/mL: INTERMEDIATE titer      301 or greater ng/mL: HIGH  titer   Recent imaging - CT scan abd/pelvis with contrast from 02/19/20 showing surgical changes at the ileocecal junction. Possible active crohns disease. Low grade obstruction versus focal ileus.  - CTE 10//23 showed persistent stricture involving 7cm of the terminal ileum and suspected low-grade SBO  CT abdomen/pelvis April 16, 2023 IMPRESSION: 1. 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 Tethered  appearance of small bowel in the right lower quadrant where there is associated small bowel wall thickening and perienteric fat stranding, consistent with active Crohn's disease. Additional inflamed small bowel in the left pelvis. No perforation or abscess. 2. Mildly dilated small bowel in the pelvis may be secondary to ileus or developing small bowel obstruction. 3. Questionable areas of wall thickening involving the proximal sigmoid colon, although this may be reactive. 4. Cholelithiasis without gallbladder inflammation. 5. Bilateral nonobstructing nephrolithiasis  Past Medical History:  Diagnosis Date   Crohn disease (HCC) 2008   with hospital admission in 2011, and 8/12 for flare up and SBO relieved with bowel rest. no suregery, no meds for CD   Diabetes mellitus type II    Hypertension     Past Surgical History:  Procedure Laterality Date   BOWEL RESECTION  01/25/2012   Procedure: SMALL BOWEL RESECTION;  Surgeon: Clovis Pu. 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 Cornett, MD;  Location: [facility] OR;  Service: General;  Laterality: N/A;   SCROTAL SURGERY  ~ 49   "took out a pellet"    Family History  Problem Relation Age of Onset   Cancer Mother        lung   Diabetes Mother    Alopecia Mother    Coronary artery disease Mother    Diabetes Sister    Hyperlipidemia Sister    Hypertension Sister    Angina Brother    Hepatitis Brother    Alcohol abuse Brother    Diabetes Brother    Colon cancer Paternal Grandmother    Esophageal cancer Neg Hx    Stomach cancer Neg Hx    Rectal cancer Neg Hx      Social History   Tobacco Use   Smoking status: Every Day    Packs/day: 0.25    Years: 30.00    Additional pack years: 0.00    Total pack years: 7.50    Types: Cigarettes   Smokeless tobacco: Former    Types:  Chew    Quit date: 01/20/1997  Vaping Use   Vaping Use: Never used  Substance Use Topics   Alcohol use: Not Currently   Drug use: No    Prior to Admission medications   Medication Sig Start Date End Date Taking? Authorizing Provider  Adalimumab (HUMIRA PEN) 40 MG/0.4ML PNKT Inject 1 Pen into the skin every 14 (fourteen) days. Patient taking differently: Inject 40 mg into the skin every 14 (fourteen) days. 08/24/22  Yes Tressia Danas, MD  atorvastatin (LIPITOR) 10 MG tablet TAKE 1 TABLET BY MOUTH DAILY 04/06/23  Yes Massie Maroon, FNP  linagliptin (TRADJENTA) 5 MG TABS tablet TAKE 1 TABLET BY MOUTH DAILY 02/18/23  Yes Massie Maroon, FNP  lisinopril (ZESTRIL) 20 MG tablet TAKE 1 TABLET BY  MOUTH DAILY 04/06/23  Yes Massie Maroon, FNP  metFORMIN (GLUCOPHAGE) 1000 MG tablet TAKE ONE (1) TABLET BY MOUTH TWICE DAILY WITH A MEAL 04/06/23  Yes Massie Maroon, FNP  omega-3 acid ethyl esters (LOVAZA) 1 g capsule Take 2 g by mouth 2 (two) times daily. 01/15/21  Yes [provider]  Adalimumab (HUMIRA PEN) 40 MG/0.4ML PNKT Inject 40 mg into the skin every 14 (fourteen) days. Patient not taking: Reported on 08/12/2022 09/16/20   Tressia Danas, MD  Adalimumab [facility] PEN-CD/UC/HS STARTER) 40 MG/0.8ML PNKT Day 1:  Inject 160 mg subcutaneous  Day 15: Inject 80 mg subcutaneous Patient not taking: Reported on 04/16/2023 08/24/22   Tressia Danas, MD  Blood Glucose Monitoring Suppl (TRUE METRIX METER) w/Device KIT 1 each by Does not apply route 4 (four) times daily -  before meals and at bedtime. 06/21/19   Mike Gip, FNP  glucose blood (TRUE METRIX BLOOD GLUCOSE TEST) test strip Use as instructed 07/06/17   Massie Maroon, FNP  HUMIRA PEN-CD/UC/HS STARTER 40 MG/0.8ML PNKT DAY 1:  INJECT 160 MG SUBCUTANEOUS  DAY 15: INJECT 80 MG SUBCUTANEOUS Patient not taking: Reported on 04/16/2023 08/26/22   Tressia Danas, MD  ibuprofen (ADVIL) 600 MG tablet Take 1 tablet (600 mg total) by  mouth every 6 (six) hours as needed. Patient not taking: Reported on 04/16/2023 04/12/23   Donell Beers, FNP  methocarbamol (ROBAXIN) 500 MG tablet Take 1 tablet (500 mg total) by mouth 4 (four) times daily. Patient not taking: Reported on 04/16/2023 03/28/23   Elson Areas, PA-C  furosemide (LASIX) 10 MG/ML solution Take by mouth daily.  01/20/12  [provider]  mesalamine (PENTASA) 250 MG CR capsule Take 1,000 mg by mouth 4 (four) times daily.  01/20/12  [provider]  ONGLYZA 5 MG TABS tablet TAKE 1 TABLET (5 MG TOTAL) BY MOUTH DAILY. Patient not taking: Reported on 06/03/2020 06/02/20 06/03/20  Massie Maroon, FNP  pioglitazone (ACTOS) 15 MG tablet Take by mouth daily.  01/20/12  [provider]    Current Facility-Administered Medications  Medication Dose Route Frequency Provider Last Rate Last Admin   HYDROmorphone (DILAUDID) injection 0.5 mg  0.5 mg Intravenous Q2H PRN Earlie Lou L, MD   0.5 mg at 04/17/23 0624   insulin aspart (novoLOG) injection 0-5 Units  0-5 Units Subcutaneous QHS Garba, Mohammad L, MD       insulin aspart (novoLOG) injection 0-9 Units  0-9 Units Subcutaneous TID WC Rometta Emery, MD   2 Units at 04/17/23 1610   lactated ringers infusion   Intravenous Continuous Rometta Emery, MD 100 mL/hr at 04/17/23 0031 New Bag at 04/17/23 0031   methylPREDNISolone sodium succinate (SOLU-MEDROL) 40 mg/mL injection 40 mg  40 mg Intravenous Q12H Shalhoub, Deno Lunger, MD       ondansetron [facility]) tablet 4 mg  4 mg Oral Q6H PRN Rometta Emery, MD       Or   ondansetron (ZOFRAN) injection 4 mg  4 mg Intravenous Q6H PRN Earlie Lou L, MD       sodium chloride 0.9 % bolus 1,000 mL  1,000 mL Intravenous Once Delice Bison F, PA-C        Allergies as of 04/16/2023 - Review Complete 04/16/2023  Allergen Reaction Noted   Trazodone and nefazodone  12/28/2018     Review of Systems:    As per HPI, otherwise negative    Physical  Exam:  Vital signs  in last 24 hours: Temp:  [98.4 F (36.9 C)-99.4 F (37.4 C)] 98.6 F (37 C) (06/02 0620) Pulse Rate:  [82-96] 82 (06/02 0620) Resp:  [17-18] 17 (06/02 0620) BP: (138-147)/(71-78) 138/78 (06/02 0620) SpO2:  [95 %-100 %] 95 % (06/02 0620) Weight:  [99.8 kg] 99.8 kg (06/01 2109) Last BM Date : 04/16/23 General:   Pleasant African-American male in NAD Head:  Normocephalic and atraumatic. Eyes:   No icterus.   Conjunctiva pink. Ears:  Normal auditory acuity. Neck:  Supple Lungs:  Respirations even and unlabored. Lungs clear to auscultation bilaterally.   No wheezes, crackles, or rhonchi.  Heart:  Regular rate and rhythm; no MRG Abdomen:  Soft, mildly distended, tenderness to palpation in the right lower quadrant without rigidity or guarding.  Hypoactive bowel sounds. No appreciable masses or hepatomegaly.  Rectal:  Not performed.  Msk:  Symmetrical without gross deformities.  Extremities:  Without edema. Neurologic:  Alert and  oriented x4;  grossly normal neurologically. Skin:  Intact without significant lesions or rashes. Psych:  Alert and cooperative. Normal affect.  LAB RESULTS: Recent Labs    04/16/23 2108 04/17/23 0101  WBC 14.6* 12.7*  HGB 12.8* 12.0*  HCT 40.7 38.2*  PLT 413* 388   BMET Recent Labs    04/16/23 2108 04/17/23 0101  NA 139 137  K 3.8 3.9  CL 104 104  CO2 24 24  GLUCOSE 129* 112*  BUN 10 10  CREATININE 0.99 0.96  CALCIUM 8.8* 8.4*   LFT Recent Labs    04/17/23 0101  PROT 6.9  ALBUMIN 3.0*  AST 14*  ALT 17  ALKPHOS 63  BILITOT 0.5   PT/INR No results for input(s): "LABPROT", "INR" in the last 72 hours.  STUDIES: CT ABDOMEN PELVIS W CONTRAST  Result Date: 04/16/2023 CLINICAL DATA:  Acute abdominal pain. Crohn's exacerbation. EXAM: CT ABDOMEN AND PELVIS WITH CONTRAST TECHNIQUE: Multidetector CT imaging of the abdomen and pelvis was performed using the standard protocol following bolus administration of intravenous  contrast. RADIATION DOSE REDUCTION: This exam was performed according to the departmental dose-optimization program which includes automated exposure control, adjustment of the mA and/or kV according to patient size and/or use of iterative reconstruction technique. CONTRAST:  OMNIPAQUE IOHEXOL 300 MG/ML  SOLN COMPARISON:  Most recent CT 08/19/2022 FINDINGS: Lower chest: Bandlike scarring in the left lower lobe. No pleural effusion or acute airspace disease. Hepatobiliary: No focal liver abnormalities. Layering gallstones within physiologically distended gallbladder. No pericholecystic inflammation. No biliary dilatation or visible choledocholithiasis. Pancreas: Dilated proximal pancreatic duct at 6 mm. This is unchanged. No evidence of pancreatic mass or inflammation. Spleen: Normal in size without focal abnormality. Adrenals/Urinary Tract: Normal adrenal glands. No hydronephrosis or perinephric edema. Homogeneous renal enhancement with symmetric excretion on delayed phase imaging. Punctate nonobstructing stones within the lower right and upper left kidneys. Left renal cyst needs no further imaging follow-up. Urinary bladder is physiologically distended without wall thickening. Stomach/Bowel: Detailed bowel assessment is limited in the absence of enteric contrast. 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 The stomach is unremarkable. Tethered appearance of small bowel in the right lower quadrant where there is associated small bowel wall thickening and perienteric fat stranding, for example series 2, image 55. Mildly dilated small bowel in the pelvis measuring up to 3.9 cm. There is mild mesenteric edema involving a bowel loop in the left abdomen, series 7, image 59. There is some questionable areas of wall thickening involving the proximal sigmoid colon, series 2, image 69, although  this may be reactive. No other evidence of colonic inflammation. No bowel pneumatosis. Vascular/Lymphatic: Mesenteric adenopathy  is typically reactive. No acute vascular finding. Aorto bi-iliac atherosclerosis. Reproductive: Prostate is unremarkable. Other: No free air or perforation. Generalized mesenteric stranding within the right and left abdomen in the areas of bowel inflammation. Trace areas of mesenteric and pelvic free fluid. No organized or drainable fluid collection. Musculoskeletal: There are no acute or suspicious osseous abnormalities. Thoracolumbar spondylosis with anterior spurring. IMPRESSION: 1. 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 Tethered appearance of small bowel in the right lower quadrant where there is associated small bowel wall thickening and perienteric fat stranding, consistent with active Crohn's disease. Additional inflamed small bowel in the left pelvis. No perforation or abscess. 2. Mildly dilated small bowel in the pelvis may be secondary to ileus or developing small bowel obstruction. 3. Questionable areas of wall thickening involving the proximal sigmoid colon, although this may be reactive. 4. Cholelithiasis without gallbladder inflammation. 5. Bilateral nonobstructing nephrolithiasis. Aortic Atherosclerosis (ICD10-I70.0). Electronically Signed   By: Narda Rutherford M.D.   On: 04/16/2023 23:00     PREVIOUS ENDOSCOPIES:            Colonoscopy March 2020 (Dr. Orvan Falconer) Normal-appearing colon except for a very mild focal area of erythema in the descending colon Ulcerated, stenotic ileocolonic anastomosis, unable to traverse   Impression / Plan:   57 year old male with history of fibro stenotic ileal Crohn's status post ileocecectomy/small bowel resection, admitted with active Crohn's disease and evidence of partial small bowel obstruction.  He has been on Humira since November 2023, and was noted to have low drug levels and low titer antibodies in December.   Will treat with IV steroids with plans to transition to oral during this admission.  He will likely need to switch therapy, as he has been  on Humira for 6 months and has had progression of disease.  We will obtain a repeat drug level/antibody titer, and consider increasing Humira to weekly based on these results. He will likely need to undergo another surgery, but unlikely to needed during this admission based on his current exam/symptoms.  Active Crohn's disease with partial small bowel obstruction - Continue Solu-Medrol 40 mg IV twice daily - Okay to advance to clear liquid diet, given his tolerance of small sips of water with no nausea/vomiting - Check adalimumab levels/antibodies - Fecal calprotectin pending -Patient will likely need change in therapy, and will likely need repeat surgery, although not likely to need emergent surgery this admission; this can be arranged as outpatient - Will recheck QuantiFERON gold and hepatitis B serologies - Obtain TPMT activity level  Dr. Chales Abrahams will be taking over inpatient GI care tomorrow   Thanks   LOS: 1 day   Jenel Lucks  04/17/2023, 10:19 AM

## 2023-04-17 NOTE — Hospital Course (Signed)
57 year old male with past medical history of diabetes mellitus type 2, hypertension, hyperlipidemia and Crohn's disease (on Humira, follows with [facility]) status post small bowel resection in 2013 presenting to [facility] emergency department with complaints of abdominal pain.  Upon evaluation in the emergency department CT imaging of the abdomen pelvis revealed small bowel wall thickening and surrounding fat stranding consistent with active disease with additional inflamed small bowel in the left pelvis without perforation or abscess.  Additionally, dilated small bowel loops were noted, possibly secondary to ileus or small bowel obstruction.  Case was discussed with Dr. Meridee Score with gastroenterology who recommended hospitalization on the medicine service and initiation of steroid therapy.  Patient was admitted to the hospital service and initially made n.p.o. with aggressive intravenous volume resuscitation.  Patient was treated with intravenous steroids.  Electrolytes were monitored closely in the days that followed.  With the assistance of gastroenterology patient slowly improved as Crohn's associated obstruction seem to be clinically resolving.  Diet was able to be initiated and advanced and was tolerated.  Systemic steroid therapy was eventually transition from intravenous Solu-Medrol to oral prednisone.  Patient has a number of blood tests that are still pending at time of discharge which are to be followed up as an outpatient to help assess adjustments to patient's outpatient immunomodulating therapy.  Unfortunately has been unable to produce a stool sample to assess calprotectin due to some opiate induced constipation.  Patient has declined use of laxatives in the hospital and will have to have this obtained in the outpatient setting.  Patient is to follow-up closely in the outpatient setting with Dr. Tomasa Rand with [facility] gastroenterology on 04/22/2023.  Patient has been discharged  home on 04/19/2023 in improved and stable condition.

## 2023-04-17 NOTE — Assessment & Plan Note (Signed)
Patient been placed on Accu-Cheks before every meal and nightly with sliding scale insulin Holding home regimen of hypoglycemics Hemoglobin A1C will be ordered

## 2023-04-18 ENCOUNTER — Inpatient Hospital Stay (HOSPITAL_COMMUNITY): Payer: 59

## 2023-04-18 DIAGNOSIS — E119 Type 2 diabetes mellitus without complications: Secondary | ICD-10-CM | POA: Diagnosis not present

## 2023-04-18 DIAGNOSIS — E1169 Type 2 diabetes mellitus with other specified complication: Secondary | ICD-10-CM | POA: Diagnosis not present

## 2023-04-18 DIAGNOSIS — I10 Essential (primary) hypertension: Secondary | ICD-10-CM | POA: Diagnosis not present

## 2023-04-18 DIAGNOSIS — K50912 Crohn's disease, unspecified, with intestinal obstruction: Secondary | ICD-10-CM | POA: Diagnosis not present

## 2023-04-18 LAB — COMPREHENSIVE METABOLIC PANEL
ALT: 16 U/L (ref 0–44)
AST: 11 U/L — ABNORMAL LOW (ref 15–41)
Albumin: 2.8 g/dL — ABNORMAL LOW (ref 3.5–5.0)
Alkaline Phosphatase: 54 U/L (ref 38–126)
Anion gap: 9 (ref 5–15)
BUN: 15 mg/dL (ref 6–20)
CO2: 25 mmol/L (ref 22–32)
Calcium: 8.5 mg/dL — ABNORMAL LOW (ref 8.9–10.3)
Chloride: 104 mmol/L (ref 98–111)
Creatinine, Ser: 0.97 mg/dL (ref 0.61–1.24)
GFR, Estimated: >60 mL/min (ref >60)
Glucose, Bld: 173 mg/dL — ABNORMAL HIGH (ref 70–99)
Potassium: 3.8 mmol/L (ref 3.5–5.1)
Sodium: 138 mmol/L (ref 135–145)
Total Bilirubin: 0.3 mg/dL (ref 0.3–1.2)
Total Protein: 6.7 g/dL (ref 6.5–8.1)

## 2023-04-18 LAB — CBC WITH DIFFERENTIAL/PLATELET
Abs Immature Granulocytes: 0.04 10*3/uL (ref 0.00–0.07)
Basophils Absolute: 0 10*3/uL (ref 0.0–0.1)
Basophils Relative: 0 %
Eosinophils Absolute: 0 10*3/uL (ref 0.0–0.5)
Eosinophils Relative: 0 %
HCT: 35.5 % — ABNORMAL LOW (ref 39.0–52.0)
Hemoglobin: 11.4 g/dL — ABNORMAL LOW (ref 13.0–17.0)
Immature Granulocytes: 0 %
Lymphocytes Relative: 12 %
Lymphs Abs: 1.2 10*3/uL (ref 0.7–4.0)
MCH: 27.9 pg (ref 26.0–34.0)
MCHC: 32.1 g/dL (ref 30.0–36.0)
MCV: 86.8 fL (ref 80.0–100.0)
Monocytes Absolute: 0.7 10*3/uL (ref 0.1–1.0)
Monocytes Relative: 7 %
Neutro Abs: 7.9 10*3/uL — ABNORMAL HIGH (ref 1.7–7.7)
Neutrophils Relative %: 81 %
Platelets: 400 10*3/uL (ref 150–400)
RBC: 4.09 MIL/uL — ABNORMAL LOW (ref 4.22–5.81)
RDW: 14.7 % (ref 11.5–15.5)
WBC: 9.9 10*3/uL (ref 4.0–10.5)
nRBC: 0 % (ref 0.0–0.2)

## 2023-04-18 LAB — GLUCOSE, CAPILLARY
Glucose-Capillary: 159 mg/dL — ABNORMAL HIGH (ref 70–99)
Glucose-Capillary: 219 mg/dL — ABNORMAL HIGH (ref 70–99)
Glucose-Capillary: 206 mg/dL — ABNORMAL HIGH (ref 70–99)
Glucose-Capillary: 271 mg/dL — ABNORMAL HIGH (ref 70–99)

## 2023-04-18 LAB — MAGNESIUM: Magnesium: 1.9 mg/dL (ref 1.7–2.4)

## 2023-04-18 LAB — C-REACTIVE PROTEIN: CRP: 6 mg/dL — ABNORMAL HIGH (ref <1.0)

## 2023-04-18 NOTE — Progress Notes (Signed)
Mobility Specialist - Progress Note   04/18/23 0924  Mobility  Activity Ambulated independently in hallway  Level of Assistance Independent  Assistive Device None  Distance Ambulated (ft) 500 ft  Activity Response Tolerated well  Mobility Referral Yes  $Mobility charge 1 Mobility  Mobility Specialist Start Time (ACUTE ONLY) 0915  Mobility Specialist Stop Time (ACUTE ONLY) 0924  Mobility Specialist Time Calculation (min) (ACUTE ONLY) 9 min   Pt received in bed and agreeable to mobility. No complaints during session. Pt to bed after session with all needs met.    [facility]

## 2023-04-18 NOTE — Progress Notes (Signed)
PROGRESS NOTE   Jared [name]  WGN:[id number] DOB: August 23, 1966 DOA: 04/16/2023 PCP: Massie Maroon, FNP   Date of Service: the patient was seen and examined on 04/18/2023  Brief Narrative:  57 year old male with past medical history of diabetes mellitus type 2, hypertension, hyperlipidemia and Crohn's disease (on Humira, follows with [facility]) status post small bowel resection in 2013 presenting to [facility] emergency department with complaints of abdominal pain.  Upon evaluation in the emergency department CT imaging of the abdomen pelvis revealed small bowel wall thickening and surrounding fat stranding consistent with active disease with additional inflamed small bowel in the left pelvis without perforation or abscess.  Additionally, dilated small bowel loops were noted, possibly secondary to ileus or small bowel obstruction.  Case was discussed with Dr. Meridee Score with gastroenterology who recommended hospitalization on the medicine service and initiation of steroid therapy.     Assessment and Plan: * Acute Crohn's disease with intestinal obstruction (HCC) Continue Solu-Medrol 40 mg twice daily.  GI recommends transitioning patient to oral steroids beginning tomorrow followed by taper. Advance to full liquid diet As needed opiate-based analgesics for associated pain Gastroenterology is following along-their input is appreciated.  Patient may need to be transitioned off of Humira to a different agent in the near future as well as need a referral to outpatient surgery.  Type 2 diabetes mellitus without complication, without long-term current use of insulin (HCC) Patient been placed on Accu-Cheks before every meal and nightly with sliding scale insulin Holding home regimen of hypoglycemics Hemoglobin A1C still pending.   Essential hypertension Holding home regimen of oral antihypertensives As needed intravenous antihypertensives for markedly elevated blood pressure  Mixed  diabetic hyperlipidemia associated with type 2 diabetes mellitus (HCC) Holding statin therapy for now      Subjective:  Patient continues to complain of abdominal pain, moderate intensity, sharp in quality, mainly located in the right lower quadrant radiating diffusely, worse with movement and improved with rest.  Physical Exam:  Vitals:   04/17/23 2042 04/18/23 0524 04/18/23 1400 04/18/23 2026  BP: 121/63 (!) 145/66 130/70 (!) 151/70  Pulse: 67 64 70 73  Resp: 17 17 18 17   Temp: 98 F (36.7 C) 98.2 F (36.8 C) 98.6 F (37 C) 98.2 F (36.8 C)  TempSrc: Oral Oral Oral Oral  SpO2: 96% 99% 100% 98%  Weight:      Height:        Constitutional: Awake alert and oriented x3, patient is in mild distress due to pain. Skin: no rashes, no lesions, poor skin turgor noted. Eyes: Pupils are equally reactive to light.  No evidence of scleral icterus or conjunctival pallor.  ENMT: Dry mucous membranes noted.  Posterior pharynx clear of any exudate or lesions.   Respiratory: clear to auscultation bilaterally, no wheezing, no crackles. Normal respiratory effort. No accessory muscle use.  Cardiovascular: Regular rate and rhythm, no murmurs / rubs / gallops. No extremity edema. 2+ pedal pulses. No carotid bruits.  Abdomen:  tenderness noted in the right lower quadrant and less so in the left lower quadrant.  Slightly improved compared to yesterday.  No evidence of intra-abdominal masses.  Positive bowel sounds noted in all quadrants.   Musculoskeletal: No joint deformity upper and lower extremities. Good ROM, no contractures. Normal muscle tone.    Data Reviewed:  I have personally reviewed and interpreted labs, imaging.  Significant findings are   CBC: Recent Labs  Lab 04/16/23 2108 04/17/23 0101 04/18/23 0756  WBC  14.6* 12.7* 9.9  NEUTROABS 11.4*  --  7.9*  HGB 12.8* 12.0* 11.4*  HCT 40.7 38.2* 35.5*  MCV 88.5 87.4 86.8  PLT 413* 388 400   Basic Metabolic Panel: Recent Labs   Lab 04/16/23 2108 04/17/23 0101 04/18/23 0756  NA 139 137 138  K 3.8 3.9 3.8  CL 104 104 104  CO2 24 24 25   GLUCOSE 129* 112* 173*  BUN 10 10 15   CREATININE 0.99 0.96 0.97  CALCIUM 8.8* 8.4* 8.5*  MG  --   --  1.9   GFR: Estimated Creatinine Clearance: 106 mL/min (by C-G formula based on SCr of 0.97 mg/dL). Liver Function Tests: Recent Labs  Lab 04/16/23 2108 04/17/23 0101 04/18/23 0756  AST 17 14* 11*  ALT 19 17 16   ALKPHOS 74 63 54  BILITOT 0.6 0.5 0.3  PROT 8.0 6.9 6.7  ALBUMIN 3.6 3.0* 2.8*      Code Status:  Full code.  Code status decision has been confirmed with: patient   Severity of Illness:  The appropriate patient status for this patient is INPATIENT. Inpatient status is judged to be reasonable and necessary in order to provide the required intensity of service to ensure the patient's safety. The patient's presenting symptoms, physical exam findings, and initial radiographic and laboratory data in the context of their chronic comorbidities is felt to place them at high risk for further clinical deterioration. Furthermore, it is not anticipated that the patient will be medically stable for discharge from the hospital within 2 midnights of admission.   * I certify that at the point of admission it is my clinical judgment that the patient will require inpatient hospital care spanning beyond 2 midnights from the point of admission due to high intensity of service, high risk for further deterioration and high frequency of surveillance required.*  Time spent:  39 minutes  Author:  Marinda Elk MD  04/18/2023 10:28 PM

## 2023-04-18 NOTE — Progress Notes (Addendum)
Progress Note  Primary GI: Dr. Orvan Falconer  LOS: 2 days   Chief Complaint:Crohn's disease, SBO   Subjective   Patient states his abdominal pain is better compared to yesterday. Reports pain in right side of abdomen. Originally pain started in right side and went across lower abdomen, though today he states that has improved and is more local to right abdomen. Denies nausea/vomiting. Has not had a bowel movement since 5/31 (Friday) at 8PM. Tolerating clear liquids without difficulty.   Objective   Vital signs in last 24 hours: Temp:  [98 F (36.7 C)-98.4 F (36.9 C)] 98.2 F (36.8 C) (06/03 0524) Pulse Rate:  [64-73] 64 (06/03 0524) Resp:  [14-20] 17 (06/03 0524) BP: (121-145)/(54-70) 145/66 (06/03 0524) SpO2:  [93 %-99 %] 99 % (06/03 0524) Last BM Date : 04/16/23 Last BM recorded by nurses in past 5 days No data recorded  General:   male in no acute distress  Heart:  Regular rate and rhythm; no murmurs Pulm: Clear anteriorly; no wheezing Abdomen: soft, nondistended, normal bowel sounds in all quadrants. Right sided tenderness Extremities:  No edema Neurologic:  Alert and  oriented x4;  No focal deficits.  Psych:  Cooperative. Normal mood and affect.  Intake/Output from previous day: 06/02 0701 - 06/03 0700 In: 3325.6 [P.O.:1030; I.V.:2295.6] Out: -  Intake/Output this shift: No intake/output data recorded.  Studies/Results: CT ABDOMEN PELVIS W CONTRAST  Result Date: 04/16/2023 CLINICAL DATA:  Acute abdominal pain. Crohn's exacerbation. EXAM: CT ABDOMEN AND PELVIS WITH CONTRAST TECHNIQUE: Multidetector CT imaging of the abdomen and pelvis was performed using the standard protocol following bolus administration of intravenous contrast. RADIATION DOSE REDUCTION: This exam was performed according to the departmental dose-optimization program which includes automated exposure control, adjustment of the mA and/or kV according to patient size and/or use of iterative reconstruction  technique. CONTRAST:  OMNIPAQUE IOHEXOL 300 MG/ML  SOLN COMPARISON:  Most recent CT 08/19/2022 FINDINGS: Lower chest: Bandlike scarring in the left lower lobe. No pleural effusion or acute airspace disease. Hepatobiliary: No focal liver abnormalities. Layering gallstones within physiologically distended gallbladder. No pericholecystic inflammation. No biliary dilatation or visible choledocholithiasis. Pancreas: Dilated proximal pancreatic duct at 6 mm. This is unchanged. No evidence of pancreatic mass or inflammation. Spleen: Normal in size without focal abnormality. Adrenals/Urinary Tract: Normal adrenal glands. No hydronephrosis or perinephric edema. Homogeneous renal enhancement with symmetric excretion on delayed phase imaging. Punctate nonobstructing stones within the lower right and upper left kidneys. Left renal cyst needs no further imaging follow-up. Urinary bladder is physiologically distended without wall thickening. Stomach/Bowel: Detailed bowel assessment is limited in the absence of enteric contrast. 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 The stomach is unremarkable. Tethered appearance of small bowel in the right lower quadrant where there is associated small bowel wall thickening and perienteric fat stranding, for example series 2, image 55. Mildly dilated small bowel in the pelvis measuring up to 3.9 cm. There is mild mesenteric edema involving a bowel loop in the left abdomen, series 7, image 59. There is some questionable areas of wall thickening involving the proximal sigmoid colon, series 2, image 69, although this may be reactive. No other evidence of colonic inflammation. No bowel pneumatosis. Vascular/Lymphatic: Mesenteric adenopathy is typically reactive. No acute vascular finding. Aorto bi-iliac atherosclerosis. Reproductive: Prostate is unremarkable. Other: No free air or perforation. Generalized mesenteric stranding within the right and left abdomen in the areas of bowel  inflammation. Trace areas of mesenteric and pelvic free fluid. No organized or  drainable fluid collection. Musculoskeletal: There are no acute or suspicious osseous abnormalities. Thoracolumbar spondylosis with anterior spurring. IMPRESSION: 1. 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 Tethered appearance of small bowel in the right lower quadrant where there is associated small bowel wall thickening and perienteric fat stranding, consistent with active Crohn's disease. Additional inflamed small bowel in the left pelvis. No perforation or abscess. 2. Mildly dilated small bowel in the pelvis may be secondary to ileus or developing small bowel obstruction. 3. Questionable areas of wall thickening involving the proximal sigmoid colon, although this may be reactive. 4. Cholelithiasis without gallbladder inflammation. 5. Bilateral nonobstructing nephrolithiasis. Aortic Atherosclerosis (ICD10-I70.0). Electronically Signed   By: Narda Rutherford M.D.   On: 04/16/2023 23:00    Lab Results: Recent Labs    04/16/23 2108 04/17/23 0101 04/18/23 0756  WBC 14.6* 12.7* 9.9  HGB 12.8* 12.0* 11.4*  HCT 40.7 38.2* 35.5*  PLT 413* 388 400   BMET Recent Labs    04/16/23 2108 04/17/23 0101 04/18/23 0756  NA 139 137 138  K 3.8 3.9 3.8  CL 104 104 104  CO2 24 24 25   GLUCOSE 129* 112* 173*  BUN 10 10 15   CREATININE 0.99 0.96 0.97  CALCIUM 8.8* 8.4* 8.5*   LFT Recent Labs    04/18/23 0756  PROT 6.7  ALBUMIN 2.8*  AST 11*  ALT 16  ALKPHOS 54  BILITOT 0.3   PT/INR No results for input(s): "LABPROT", "INR" in the last 72 hours.   Scheduled Meds:  insulin aspart  0-5 Units Subcutaneous QHS   insulin aspart  0-9 Units Subcutaneous TID WC   methylPREDNISolone (SOLU-MEDROL) injection  40 mg Intravenous Q12H   Continuous Infusions:  lactated ringers 100 mL/hr at 04/17/23 1908   sodium chloride        Patient profile:   57 year old male with history of fibro stenotic ileal Crohn's status post  ileocecectomy/small bowel resection, admitted with active Crohn's disease and evidence of partial small bowel obstruction. He has been on Humira since November 2023, and was noted to have low drug levels and low titer antibodies in December.   Colonoscopy March 2020 (Dr. Orvan Falconer) Normal-appearing colon except for a very mild focal area of erythema in the descending colon Ulcerated, stenotic ileocolonic anastomosis, unable to traverse   Impression:   Crohn's disease with partial small bowel obstruction -WBC 9.9, improved -CRP 10.3, repeat pending -TB gold in process Patient reports improvement in symptoms, labs stable.    Plan:   - Continue Solu-medrol 40mg  IV twice daily with plans to switch to oral prior to discharge - Tolerating clear liquids without difficulty, consider switching to full liquids - Patient will need repeat Hep B vaccination series. - TPMT pending - Fecal calprotectin needs to be collected - obtain Adalimumab levels/antibodies  - continue supportive care  Bayley Leanna Sato  04/18/2023, 9:22 AM     Attending physician's note   I have taken history, reviewed the chart and examined the patient. I performed a substantive portion of this encounter, including complete performance of at least one of the key components, in conjunction with the APP. I agree with the Advanced Practitioner's note, impression and recommendations.   Got a detailed signout from Dr. Tomasa Rand  PSBO d/t fibrostenotic ileocecal Crohn's s/p prev ileocectomy.  Failed Humira.  Responded well to IV Solu-Medrol. No diarrhea/Abdo pain.  X-ray KUB with significant improvement.  Tolerating FLD well.  Plan: -Switch IV to PO pred 40 QD in AM. Then tapering dose- 40 mg  QD x 1 week, 30 QD x 1 week, followed by 20 mg QD x 2 weeks, then 10 mg QD x 2 weeks and then stop. -Advance diet in AM -Follow TB gold test, TPMT -Recommend hep B vaccine as outpt -May have to switch Humira to ?Skyrizi (to be  determined) -FU GI in 2-3 weeks with Dr. Franklyn Lor clinic. -Consider surgical eval as outpt -Likely D/C tomorrow PM   Edman Circle, MD [facility] [phone]

## 2023-04-18 NOTE — TOC CM/SW Note (Signed)
Transition of Care [facility]) - Inpatient Brief Assessment   Patient Details  Name: Sohum Klingerman MRN: [id number] Date of Birth: April 26, 1966  Transition of Care (TOC) CM/SW Contact:    Amada Jupiter, LCSW Phone Number: 04/18/2023, 8:55 AM   Clinical Narrative:  No TOC needs identified   Transition of Care Asessment: Insurance and Status: Insurance coverage has been reviewed Patient has primary care physician: Yes Home environment has been reviewed: home alone Prior level of function:: independent Prior/Current Home Services: No current home services Social Determinants of Health Reivew: SDOH reviewed no interventions necessary Readmission risk has been reviewed: Yes Transition of care needs: no transition of care needs at this time

## 2023-04-19 ENCOUNTER — Other Ambulatory Visit (HOSPITAL_COMMUNITY): Payer: Self-pay

## 2023-04-19 ENCOUNTER — Other Ambulatory Visit: Payer: Self-pay

## 2023-04-19 DIAGNOSIS — K50912 Crohn's disease, unspecified, with intestinal obstruction: Secondary | ICD-10-CM | POA: Diagnosis not present

## 2023-04-19 DIAGNOSIS — I10 Essential (primary) hypertension: Secondary | ICD-10-CM | POA: Diagnosis not present

## 2023-04-19 DIAGNOSIS — E1169 Type 2 diabetes mellitus with other specified complication: Secondary | ICD-10-CM | POA: Diagnosis not present

## 2023-04-19 DIAGNOSIS — E119 Type 2 diabetes mellitus without complications: Secondary | ICD-10-CM | POA: Diagnosis not present

## 2023-04-19 LAB — CBC WITH DIFFERENTIAL/PLATELET
Abs Immature Granulocytes: 0.05 10*3/uL (ref 0.00–0.07)
Basophils Absolute: 0 10*3/uL (ref 0.0–0.1)
Basophils Relative: 0 %
Eosinophils Absolute: 0 10*3/uL (ref 0.0–0.5)
Eosinophils Relative: 0 %
HCT: 35.2 % — ABNORMAL LOW (ref 39.0–52.0)
Hemoglobin: 11.3 g/dL — ABNORMAL LOW (ref 13.0–17.0)
Immature Granulocytes: 1 %
Lymphocytes Relative: 10 %
Lymphs Abs: 0.9 10*3/uL (ref 0.7–4.0)
MCH: 27.8 pg (ref 26.0–34.0)
MCHC: 32.1 g/dL (ref 30.0–36.0)
MCV: 86.7 fL (ref 80.0–100.0)
Monocytes Absolute: 0.4 10*3/uL (ref 0.1–1.0)
Monocytes Relative: 4 %
Neutro Abs: 7.5 10*3/uL (ref 1.7–7.7)
Neutrophils Relative %: 85 %
Platelets: 405 10*3/uL — ABNORMAL HIGH (ref 150–400)
RBC: 4.06 MIL/uL — ABNORMAL LOW (ref 4.22–5.81)
RDW: 14.6 % (ref 11.5–15.5)
WBC: 8.9 10*3/uL (ref 4.0–10.5)
nRBC: 0 % (ref 0.0–0.2)

## 2023-04-19 LAB — MAGNESIUM: Magnesium: 2 mg/dL (ref 1.7–2.4)

## 2023-04-19 LAB — COMPREHENSIVE METABOLIC PANEL
ALT: 19 U/L (ref 0–44)
AST: 14 U/L — ABNORMAL LOW (ref 15–41)
Albumin: 3 g/dL — ABNORMAL LOW (ref 3.5–5.0)
Alkaline Phosphatase: 54 U/L (ref 38–126)
Anion gap: 6 (ref 5–15)
BUN: 13 mg/dL (ref 6–20)
CO2: 25 mmol/L (ref 22–32)
Calcium: 8.3 mg/dL — ABNORMAL LOW (ref 8.9–10.3)
Chloride: 107 mmol/L (ref 98–111)
Creatinine, Ser: 0.86 mg/dL (ref 0.61–1.24)
GFR, Estimated: >60 mL/min (ref >60)
Glucose, Bld: 190 mg/dL — ABNORMAL HIGH (ref 70–99)
Potassium: 4.2 mmol/L (ref 3.5–5.1)
Sodium: 138 mmol/L (ref 135–145)
Total Bilirubin: 0.2 mg/dL — ABNORMAL LOW (ref 0.3–1.2)
Total Protein: 6.6 g/dL (ref 6.5–8.1)

## 2023-04-19 LAB — MISC LABCORP TEST (SEND OUT): Labcorp test code: 83935

## 2023-04-19 LAB — C-REACTIVE PROTEIN: CRP: 1.7 mg/dL — ABNORMAL HIGH (ref <1.0)

## 2023-04-19 LAB — HEMOGLOBIN A1C
Hgb A1c MFr Bld: 8.3 % — ABNORMAL HIGH (ref 4.8–5.6)
Mean Plasma Glucose: 192 mg/dL

## 2023-04-19 LAB — GLUCOSE, CAPILLARY: Glucose-Capillary: 242 mg/dL — ABNORMAL HIGH (ref 70–99)

## 2023-04-19 MED ORDER — PREDNISONE 10 MG PO TABS
ORAL_TABLET | ORAL | 0 refills | Status: AC
  Filled 2023-04-19: qty 91, 42d supply, fill #0

## 2023-04-19 MED ORDER — PREDNISONE 20 MG PO TABS: 40.0000 mg | ORAL_TABLET | Freq: Every day | ORAL | Status: DC

## 2023-04-19 MED ORDER — POLYETHYLENE GLYCOL 3350 17 G PO PACK
17.0000 g | PACK | Freq: Every day | ORAL | 0 refills | Status: DC | PRN
  Filled 2023-04-19: qty 14, 14d supply, fill #0

## 2023-04-19 NOTE — Progress Notes (Signed)
Reviewed written discharge instructions with patient. All questions answered. Patient verbalized understanding. Discharged via wheelchair with all belongings... in stable condition. 

## 2023-04-19 NOTE — Progress Notes (Addendum)
Progress Note  Primary GI: (Dr. Orvan Falconer)  LOS: 3 days   Chief Complaint:Crohn's disease, SBO    Subjective   Patient states his pain is better today. Has not had a bowel movement. Tolerating diet without difficulty and ready to go home today.   Objective   Vital signs in last 24 hours: Temp:  [97.6 F (36.4 C)-98.6 F (37 C)] 97.6 F (36.4 C) (06/04 0553) Pulse Rate:  [64-73] 64 (06/04 0553) Resp:  [17-18] 17 (06/04 0553) BP: (130-151)/(67-70) 143/67 (06/04 0553) SpO2:  [98 %-100 %] 98 % (06/04 0553) Last BM Date : 04/15/23 Last BM recorded by nurses in past 5 days No data recorded  General:   male in no acute distress  Heart:  Regular rate and rhythm; no murmurs Pulm: Clear anteriorly; no wheezing Abdomen: soft, nondistended, normal bowel sounds in all quadrants. Mild RLQ tenderness. No organomegaly appreciated. Extremities:  No edema Neurologic:  Alert and  oriented x4;  No focal deficits.  Psych:  Cooperative. Normal mood and affect.  Intake/Output from previous day: 06/03 0701 - 06/04 0700 In: 3030 [P.O.:2220; I.V.:810] Out: 0  Intake/Output this shift: Total I/O In: 480 [P.O.:480] Out: -   Studies/Results: DG Abd 1 View  Result Date: 04/18/2023 CLINICAL DATA:  Bowel obstruction EXAM: ABDOMEN - 1 VIEW COMPARISON:  CT done on 04/16/2023 FINDINGS: Bowel gas pattern is nonspecific. There is paucity of bowel gas in lower mid abdomen. This could possibly suggest fluid-filled small bowel loops. This finding however is not conclusive. Gas and stool are noted in colon. No abnormal masses or calcifications are seen. IMPRESSION: Nonspecific bowel gas pattern. Electronically Signed   By: Ernie Avena M.D.   On: 04/18/2023 14:09    Lab Results: Recent Labs    04/17/23 0101 04/18/23 0756 04/19/23 0438  WBC 12.7* 9.9 8.9  HGB 12.0* 11.4* 11.3*  HCT 38.2* 35.5* 35.2*  PLT 388 400 405*   BMET Recent Labs    04/17/23 0101 04/18/23 0756 04/19/23 0438  NA  137 138 138  K 3.9 3.8 4.2  CL 104 104 107  CO2 24 25 25   GLUCOSE 112* 173* 190*  BUN 10 15 13   CREATININE 0.96 0.97 0.86  CALCIUM 8.4* 8.5* 8.3*   LFT Recent Labs    04/19/23 0438  PROT 6.6  ALBUMIN 3.0*  AST 14*  ALT 19  ALKPHOS 54  BILITOT 0.2*   PT/INR No results for input(s): "LABPROT", "INR" in the last 72 hours.   Scheduled Meds:  insulin aspart  0-5 Units Subcutaneous QHS   insulin aspart  0-9 Units Subcutaneous TID WC   methylPREDNISolone (SOLU-MEDROL) injection  40 mg Intravenous Q12H   Continuous Infusions:  sodium chloride        Patient profile:   57 year old male with history of fibro stenotic ileal Crohn's status post ileocecectomy/small bowel resection, admitted with active Crohn's disease and evidence of partial small bowel obstruction. He has been on Humira since November 2023, and was noted to have low drug levels and low titer antibodies in December.    Colonoscopy March 2020 (Dr. Orvan Falconer) Normal-appearing colon except for a very mild focal area of erythema in the descending colon Ulcerated, stenotic ileocolonic anastomosis, unable to traverse    Impression:   Crohn's disease with partial small bowel obstruction -WBC 8.9, improved -CRP 6.0, improved -TB gold in process - TPMT in process Patient reports improvement in symptoms, labs stable.   Plan:   - transition to PO  pred 40mg  QD. Taper 40mg  QD x 1 week, 30 QD x 1 week, followed by 20 mg QD x 2 weeks, then 10 mg QD x 2 weeks and then stop.  - can obtain fecal calprotectin as an outpatient since he has not had any bowel movements. - follow TB gold test, TPMT, and adalimumab antibody level - Hep B vaccine series as an outpatient.  - follow up scheduled with Dr. Tomasa Rand (see discharge) - GI will sign off, please call us back with any questions.  Maddilynn Esperanza Leanna Sato  04/19/2023, 8:51 AM

## 2023-04-19 NOTE — Plan of Care (Signed)

## 2023-04-19 NOTE — Discharge Summary (Signed)
Physician Discharge Summary   Patient: Jared [name] MRN: [id number] DOB: [birth year]  Admit date:     04/16/2023  Discharge date: 04/19/23  Discharge Physician: Marinda Elk   PCP: Massie Maroon, FNP   Recommendations at discharge:   Please take all prescribed medications exactly as instructed including your extended taper of prednisone.  Please speak to your outpatient providers about next steps in adjusting your long-term treatment plan for your Crohn's disease whether you stay on Humira or get switched to a different agent. Please slowly advance your diet as tolerated and maintain a low-sodium diet.  Please increase your physical activity as tolerated. Please maintain all outpatient follow-up appointments including follow-up with your primary care provider and gastroenterology clinic with Dr. Tomasa Rand. Please return to the emergency department if you develop worsening abdominal pain, nausea, vomiting, fevers in excess of 100.4 F, weakness or inability to tolerate oral intake.  Discharge Diagnoses: Principal Problem:   Acute Crohn's disease with intestinal obstruction (HCC) Active Problems:   Type 2 diabetes mellitus without complication, without long-term current use of insulin (HCC)   Essential hypertension   Mixed diabetic hyperlipidemia associated with type 2 diabetes mellitus (HCC)   DM (diabetes mellitus) (HCC)   S/P exploratory laparotomy with ileocecectomy   Crohn disease (HCC)   Dyslipidemia  Resolved Problems:   * No resolved hospital problems. *   Hospital Course: 57 year old male with past medical history of diabetes mellitus type 2, hypertension, hyperlipidemia and Crohn's disease (on Humira, follows with [facility]) status post small bowel resection in 2013 presenting to [facility] emergency department with complaints of abdominal pain.  Upon evaluation in the emergency department CT imaging of the abdomen pelvis revealed small bowel wall  thickening and surrounding fat stranding consistent with active disease with additional inflamed small bowel in the left pelvis without perforation or abscess.  Additionally, dilated small bowel loops were noted, possibly secondary to ileus or small bowel obstruction.  Case was discussed with Dr. Meridee Score with gastroenterology who recommended hospitalization on the medicine service and initiation of steroid therapy.  Patient was admitted to the hospital service and initially made n.p.o. with aggressive intravenous volume resuscitation.  Patient was treated with intravenous steroids.  Electrolytes were monitored closely in the days that followed.  With the assistance of gastroenterology patient slowly improved as Crohn's associated obstruction seem to be clinically resolving.  Diet was able to be initiated and advanced and was tolerated.  Systemic steroid therapy was eventually transition from intravenous Solu-Medrol to oral prednisone.  Patient has a number of blood tests that are still pending at time of discharge which are to be followed up as an outpatient to help assess adjustments to patient's outpatient immunomodulating therapy.  Unfortunately has been unable to produce a stool sample to assess calprotectin due to some opiate induced constipation.  Patient has declined use of laxatives in the hospital and will have to have this obtained in the outpatient setting.  Patient is to follow-up closely in the outpatient setting with Dr. Tomasa Rand with [facility] gastroenterology on 04/22/2023.  Patient has been discharged home on 04/19/2023 in improved and stable condition.    Consultants: Dr. Tomasa Rand with [facility] Procedures performed: None  Disposition: Home Diet recommendation:  Discharge Diet Orders (From admission, onward)     Start     Ordered   04/19/23 0000  Diet - low sodium heart healthy        04/19/23 1052  Cardiac diet  DISCHARGE MEDICATION: Allergies  as of 04/19/2023       Reactions   Trazodone And Nefazodone    hallucinations        Medication List     STOP taking these medications    glucose blood test strip Commonly known as: True Metrix Blood Glucose Test   ibuprofen 600 MG tablet Commonly known as: ADVIL   methocarbamol 500 MG tablet Commonly known as: ROBAXIN   True Metrix Meter w/Device Kit       TAKE these medications    atorvastatin 10 MG tablet Commonly known as: LIPITOR TAKE 1 TABLET BY MOUTH DAILY   Humira (2 Pen) 40 MG/0.4ML Pnkt Generic drug: Adalimumab Inject 1 Pen into the skin every 14 (fourteen) days. What changed:  how much to take Another medication with the same name was removed. Continue taking this medication, and follow the directions you see here.   lisinopril 20 MG tablet Commonly known as: ZESTRIL TAKE 1 TABLET BY MOUTH DAILY   metFORMIN 1000 MG tablet Commonly known as: GLUCOPHAGE TAKE ONE (1) TABLET BY MOUTH TWICE DAILY WITH A MEAL   omega-3 acid ethyl esters 1 g capsule Commonly known as: LOVAZA Take 2 g by mouth 2 (two) times daily.   polyethylene glycol 17 g packet Commonly known as: MiraLax Take 17 g by mouth daily as needed for moderate constipation or severe constipation.   predniSONE 10 MG tablet Commonly known as: DELTASONE Take 4 tablets (40 mg total) by mouth daily with breakfast for 7 days, THEN 3 tablets (30 mg total) daily with breakfast for 7 days, THEN 2 tablets (20 mg total) daily with breakfast for 14 days, THEN 1 tablet (10 mg total) daily with breakfast for 14 days. Start taking on: April 20, 2023   Tradjenta 5 MG Tabs tablet Generic drug: linagliptin TAKE 1 TABLET BY MOUTH DAILY        Follow-up Information     Jenel Lucks, MD Follow up on 04/22/2023.   Specialty: Gastroenterology Why: appt is at 8:50am. please arrive 10-15 minutes prior to appt time. Contact information: [address] [place] [address] [phone]                  Discharge Exam: Ceasar Mons Weights   04/16/23 2109  Weight: 99.8 kg    Constitutional: Awake alert and oriented x3, no associated distress.   Respiratory: clear to auscultation bilaterally, no wheezing, no crackles. Normal respiratory effort. No accessory muscle use.  Cardiovascular: Regular rate and rhythm, no murmurs / rubs / gallops. No extremity edema. 2+ pedal pulses. No carotid bruits.  Abdomen: Mild right lower quadrant tenderness.  No evidence of intra-abdominal masses.  Positive bowel sounds noted in all quadrants.   Musculoskeletal: No joint deformity upper and lower extremities. Good ROM, no contractures. Normal muscle tone.     Condition at discharge: fair  The results of significant diagnostics from this hospitalization (including imaging, microbiology, ancillary and laboratory) are listed below for reference.   Imaging Studies: DG Abd 1 View  Result Date: 04/18/2023 CLINICAL DATA:  Bowel obstruction EXAM: ABDOMEN - 1 VIEW COMPARISON:  CT done on 04/16/2023 FINDINGS: Bowel gas pattern is nonspecific. There is paucity of bowel gas in lower mid abdomen. This could possibly suggest fluid-filled small bowel loops. This finding however is not conclusive. Gas and stool are noted in colon. No abnormal masses or calcifications are seen. IMPRESSION: Nonspecific bowel gas pattern. Electronically Signed   By:  Ernie Avena M.D.   On: 04/18/2023 14:09   CT ABDOMEN PELVIS W CONTRAST  Result Date: 04/16/2023 CLINICAL DATA:  Acute abdominal pain. Crohn's exacerbation. EXAM: CT ABDOMEN AND PELVIS WITH CONTRAST TECHNIQUE: Multidetector CT imaging of the abdomen and pelvis was performed using the standard protocol following bolus administration of intravenous contrast. RADIATION DOSE REDUCTION: This exam was performed according to the departmental dose-optimization program which includes automated exposure control, adjustment of the mA and/or kV according to patient size and/or use of  iterative reconstruction technique. CONTRAST:  OMNIPAQUE IOHEXOL 300 MG/ML  SOLN COMPARISON:  Most recent CT 08/19/2022 FINDINGS: Lower chest: Bandlike scarring in the left lower lobe. No pleural effusion or acute airspace disease. Hepatobiliary: No focal liver abnormalities. Layering gallstones within physiologically distended gallbladder. No pericholecystic inflammation. No biliary dilatation or visible choledocholithiasis. Pancreas: Dilated proximal pancreatic duct at 6 mm. This is unchanged. No evidence of pancreatic mass or inflammation. Spleen: Normal in size without focal abnormality. Adrenals/Urinary Tract: Normal adrenal glands. No hydronephrosis or perinephric edema. Homogeneous renal enhancement with symmetric excretion on delayed phase imaging. Punctate nonobstructing stones within the lower right and upper left kidneys. Left renal cyst needs no further imaging follow-up. Urinary bladder is physiologically distended without wall thickening. Stomach/Bowel: Detailed bowel assessment is limited in the absence of enteric contrast. 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 The stomach is unremarkable. Tethered appearance of small bowel in the right lower quadrant where there is associated small bowel wall thickening and perienteric fat stranding, for example series 2, image 55. Mildly dilated small bowel in the pelvis measuring up to 3.9 cm. There is mild mesenteric edema involving a bowel loop in the left abdomen, series 7, image 59. There is some questionable areas of wall thickening involving the proximal sigmoid colon, series 2, image 69, although this may be reactive. No other evidence of colonic inflammation. No bowel pneumatosis. Vascular/Lymphatic: Mesenteric adenopathy is typically reactive. No acute vascular finding. Aorto bi-iliac atherosclerosis. Reproductive: Prostate is unremarkable. Other: No free air or perforation. Generalized mesenteric stranding within the right and left abdomen in the  areas of bowel inflammation. Trace areas of mesenteric and pelvic free fluid. No organized or drainable fluid collection. Musculoskeletal: There are no acute or suspicious osseous abnormalities. Thoracolumbar spondylosis with anterior spurring. IMPRESSION: 1. 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 Tethered appearance of small bowel in the right lower quadrant where there is associated small bowel wall thickening and perienteric fat stranding, consistent with active Crohn's disease. Additional inflamed small bowel in the left pelvis. No perforation or abscess. 2. Mildly dilated small bowel in the pelvis may be secondary to ileus or developing small bowel obstruction. 3. Questionable areas of wall thickening involving the proximal sigmoid colon, although this may be reactive. 4. Cholelithiasis without gallbladder inflammation. 5. Bilateral nonobstructing nephrolithiasis. Aortic Atherosclerosis (ICD10-I70.0). Electronically Signed   By: Narda Rutherford M.D.   On: 04/16/2023 23:00    Microbiology: Results for orders placed or performed in visit on 08/13/22  Calprotectin, Fecal     Status: Abnormal   Collection Time: 08/13/22 11:09 AM   Specimen: Stool   Stool  Result Value Ref Range Status   Calprotectin, Fecal 1,680 (H) 0 - 120 ug/g Final    Comment: **Results verified by repeat testing** Concentration     Interpretation   Follow-Up < 5 - 50 ug/g     Normal           None >50 -120 ug/g     Borderline  Re-evaluate in 4-6 weeks     >120 ug/g     Abnormal         Repeat as clinically                                    indicated     Labs: CBC: Recent Labs  Lab 04/16/23 2108 04/17/23 0101 04/18/23 0756 04/19/23 0438  WBC 14.6* 12.7* 9.9 8.9  NEUTROABS 11.4*  --  7.9* 7.5  HGB 12.8* 12.0* 11.4* 11.3*  HCT 40.7 38.2* 35.5* 35.2*  MCV 88.5 87.4 86.8 86.7  PLT 413* 388 400 405*   Basic Metabolic Panel: Recent Labs  Lab 04/16/23 2108 04/17/23 0101 04/18/23 0756 04/19/23 0438  NA  139 137 138 138  K 3.8 3.9 3.8 4.2  CL 104 104 104 107  CO2 24 24 25 25   GLUCOSE 129* 112* 173* 190*  BUN 10 10 15 13   CREATININE 0.99 0.96 0.97 0.86  CALCIUM 8.8* 8.4* 8.5* 8.3*  MG  --   --  1.9 2.0   Liver Function Tests: Recent Labs  Lab 04/16/23 2108 04/17/23 0101 04/18/23 0756 04/19/23 0438  AST 17 14* 11* 14*  ALT 19 17 16 19   ALKPHOS 74 63 54 54  BILITOT 0.6 0.5 0.3 0.2*  PROT 8.0 6.9 6.7 6.6  ALBUMIN 3.6 3.0* 2.8* 3.0*   CBG: Recent Labs  Lab 04/18/23 0731 04/18/23 1139 04/18/23 1607 04/18/23 2157 04/19/23 0740  GLUCAP 159* 219* 206* 271* 242*    Discharge time spent: greater than 30 minutes.  Signed: Marinda Elk, MD [facility] 04/19/2023

## 2023-04-19 NOTE — Discharge Instructions (Signed)
Please take all prescribed medications exactly as instructed including your extended taper of prednisone.  Please speak to your outpatient providers about next steps in adjusting your long-term treatment plan for your Crohn's disease whether you stay on Humira or get switched to a different agent. Please slowly advance your diet as tolerated and maintain a low-sodium diet.  Please increase your physical activity as tolerated. Please maintain all outpatient follow-up appointments including follow-up with your primary care provider and your gastroenterologist. Please return to the emergency department if you develop worsening abdominal pain, nausea, vomiting, fevers in excess of 100.4 F, weakness or inability to tolerate oral intake.

## 2023-04-20 ENCOUNTER — Telehealth: Payer: Self-pay | Admitting: *Deleted

## 2023-04-20 ENCOUNTER — Encounter: Payer: Self-pay | Admitting: *Deleted

## 2023-04-20 LAB — QUANTIFERON-TB GOLD PLUS (RQFGPL)
QuantiFERON Mitogen Value: 0.53 IU/mL
QuantiFERON Nil Value: 0 IU/mL
QuantiFERON TB1 Ag Value: 0.01 IU/mL
QuantiFERON TB2 Ag Value: 0 IU/mL

## 2023-04-20 LAB — QUANTIFERON-TB GOLD PLUS: QuantiFERON-TB Gold Plus: NEGATIVE

## 2023-04-20 NOTE — Transitions of Care (Post Inpatient/ED Visit) (Signed)
04/20/2023  Name: Jared [name] MRN: [id number] DOB: 1966/05/13  Today's TOC FU Call Status: Today's TOC FU Call Status:: Successful TOC FU Call Competed TOC FU Call Complete Date: 04/20/23  Transition Care Management Follow-up Telephone Call Date of Discharge: 04/19/23 Discharge Facility: [facility]) Type of Discharge: Inpatient Admission Primary Inpatient Discharge Diagnosis:: Acute Crohns flare How have you been since you were released from the hospital?: Better ("I am okay, I got all of the medicine and am taking it like they told me to. So far not having any problems and doing okay") Any questions or concerns?: No  Items Reviewed: Did you receive and understand the discharge instructions provided?: Yes Medications obtained,verified, and reconciled?: Yes (Medications Reviewed) Any new allergies since your discharge?: No Dietary orders reviewed?: Yes Type of Diet Ordered:: soft, bland, progress gradually Do you have support at home?: Yes People in Home: alone Name of Support/Comfort Primary Source: Reports independent in self-care activities; resides alone; supportive local sister assists as/ if needed/ indicated  Medications Reviewed Today: Medications Reviewed Today     Reviewed by Michaela Corner, RN (Registered Nurse) on 04/20/23 at 1642  Med List Status: <None>   Medication Order Taking? Sig Documenting Provider Last Dose Status Informant  Adalimumab (HUMIRA PEN) 40 MG/0.4ML PNKT [id number] Yes Inject 1 Pen into the skin every 14 (fourteen) days.  Patient taking differently: Inject 40 mg into the skin every 14 (fourteen) days.   Tressia Danas, MD Taking Active Self  atorvastatin (LIPITOR) 10 MG tablet [id number] Yes TAKE 1 TABLET BY MOUTH DAILY Massie Maroon, FNP Taking Active Self    Discontinued 01/20/12 [phone] (Patient has not taken in last 30 days)   linagliptin (TRADJENTA) 5 MG TABS tablet [id number] Yes TAKE 1 TABLET BY MOUTH DAILY Massie Maroon, FNP  Taking Active Self  lisinopril (ZESTRIL) 20 MG tablet [id number] Yes TAKE 1 TABLET BY MOUTH DAILY Massie Maroon, FNP Taking Active Self    Discontinued 01/20/12 [phone] (Patient has not taken in last 30 days)   metFORMIN (GLUCOPHAGE) 1000 MG tablet [id number] Yes TAKE ONE (1) TABLET BY MOUTH TWICE DAILY WITH A MEAL Massie Maroon, FNP Taking Active Self  omega-3 acid ethyl esters (LOVAZA) 1 g capsule [id number] Yes Take 2 g by mouth 2 (two) times daily. [provider] Taking Active Self   Patient not taking:   Discontinued 06/03/20 0921 (Reorder)     Discontinued 01/20/12 0429 (Patient has not taken in last 30 days)   polyethylene glycol (MIRALAX) 17 g packet [id number] Yes Take 17 g and stir and dissolve in any 4 to 8 ounces of beverage (cold, hot or room temperature) then drink by mouth daily as needed for moderate constipation or severe constipation. Shalhoub, Deno Lunger, MD Taking Active   predniSONE (DELTASONE) 10 MG tablet [id number] Yes Take 4 tablets (40 mg total) by mouth daily with breakfast for 7 days, THEN 3 tablets (30 mg total) daily with breakfast for 7 days, THEN 2 tablets (20 mg total) daily with breakfast for 14 days, THEN 1 tablet (10 mg total) daily with breakfast for 14 days. Marinda Elk, MD Taking Active             Home Care and Equipment/Supplies: Were Home Health Services Ordered?: No Any new equipment or medical supplies ordered?: No  Functional Questionnaire: Do you need assistance with bathing/showering or dressing?: No Do you need assistance with meal preparation?: No Do you need assistance with eating?: No Do  you have difficulty maintaining continence: No Do you need assistance with getting out of bed/getting out of a chair/moving?: No Do you have difficulty managing or taking your medications?: No  Follow up appointments reviewed: PCP Follow-up appointment confirmed?: Yes Date of PCP follow-up appointment?: 04/25/23 Follow-up Provider: PCP-  covering provider Specialist Hospital Follow-up appointment confirmed?: Yes Date of Specialist follow-up appointment?: 04/22/23 Follow-Up Specialty Provider:: GI provider Do you need transportation to your follow-up appointment?: No Do you understand care options if your condition(s) worsen?: Yes-patient verbalized understanding  SDOH Interventions Today    Flowsheet Row Most Recent Value  SDOH Interventions   Food Insecurity Interventions Intervention Not Indicated  Transportation Interventions Intervention Not Indicated  [drives self]      TOC Interventions Today    Flowsheet Row Most Recent Value  TOC Interventions   TOC Interventions Discussed/Reviewed TOC Interventions Discussed, Arranged PCP follow up within 7 days/Care Guide scheduled  [Patient declines need for ongoing/ further care coordination outreach,  no care coordination needs identified at time of TOC call today,  provided my direct contact information should questions/ concerns/ needs arise post-TOC call]      Interventions Today    Flowsheet Row Most Recent Value  Chronic Disease   Chronic disease during today's visit Other  [acute crohns flare]  General Interventions   General Interventions Discussed/Reviewed General Interventions Discussed, Doctor Visits, Durable Medical Equipment (DME)  Doctor Visits Discussed/Reviewed Doctor Visits Discussed, Specialist, PCP  Durable Medical Equipment (DME) Other  [confirmed patient does not currently require use of assistive devices]  PCP/Specialist Visits Compliance with follow-up visit  Nutrition Interventions   Nutrition Discussed/Reviewed Nutrition Discussed  Pharmacy Interventions   Pharmacy Dicussed/Reviewed Pharmacy Topics Discussed  [Full medication review with updating medication list in EHR per patient report]      Caryl Pina, RN, BSN, CCRN Alumnus RN CM Care Coordination/ Transition of Care- [facility] Care Management [phone]: direct office

## 2023-04-22 ENCOUNTER — Ambulatory Visit: Payer: 59 | Admitting: Gastroenterology

## 2023-04-22 ENCOUNTER — Telehealth: Payer: Self-pay

## 2023-04-22 LAB — THIOPURINE METHYLTRANSFERASE (TPMT), RBC: TPMT Activity:: 21.9 Units/mL RBC

## 2023-04-22 NOTE — Telephone Encounter (Signed)
Pt was lvm to find out if they have a eye doctor.  If so , we need the doctors or office name . If not we are hosting eye exam 07/21/23 and 09/23/23 with limited spots. Pt was advise to call back and let us know. KH

## 2023-04-25 ENCOUNTER — Inpatient Hospital Stay: Payer: Self-pay | Admitting: Nurse Practitioner

## 2023-04-25 ENCOUNTER — Other Ambulatory Visit: Payer: Self-pay

## 2023-04-25 ENCOUNTER — Ambulatory Visit (INDEPENDENT_AMBULATORY_CARE_PROVIDER_SITE_OTHER): Payer: 59 | Admitting: Nurse Practitioner

## 2023-04-25 ENCOUNTER — Other Ambulatory Visit: Payer: Self-pay | Admitting: Nurse Practitioner

## 2023-04-25 ENCOUNTER — Encounter: Payer: Self-pay | Admitting: Nurse Practitioner

## 2023-04-25 VITALS — BP 130/60 | HR 86 | Temp 97.2°F | Ht 74.0 in | Wt 210.8 lb

## 2023-04-25 DIAGNOSIS — K50912 Crohn's disease, unspecified, with intestinal obstruction: Secondary | ICD-10-CM

## 2023-04-25 DIAGNOSIS — E119 Type 2 diabetes mellitus without complications: Secondary | ICD-10-CM | POA: Diagnosis not present

## 2023-04-25 DIAGNOSIS — I10 Essential (primary) hypertension: Secondary | ICD-10-CM

## 2023-04-25 DIAGNOSIS — M19011 Primary osteoarthritis, right shoulder: Secondary | ICD-10-CM

## 2023-04-25 DIAGNOSIS — M47812 Spondylosis without myelopathy or radiculopathy, cervical region: Secondary | ICD-10-CM

## 2023-04-25 DIAGNOSIS — F17210 Nicotine dependence, cigarettes, uncomplicated: Secondary | ICD-10-CM | POA: Diagnosis not present

## 2023-04-25 DIAGNOSIS — Z720 Tobacco use: Secondary | ICD-10-CM

## 2023-04-25 DIAGNOSIS — Z09 Encounter for follow-up examination after completed treatment for conditions other than malignant neoplasm: Secondary | ICD-10-CM | POA: Insufficient documentation

## 2023-04-25 LAB — ADALIMUMAB LEVEL AND ANTIBODY
Adalimumab Drug Level: <0.6 ug/mL
Anti-Adalimumab Antibody: 292 ng/mL

## 2023-04-25 MED ORDER — EMPAGLIFLOZIN 10 MG PO TABS
10.0000 mg | ORAL_TABLET | Freq: Every day | ORAL | 2 refills | Status: DC
Start: 2023-04-25 — End: 2023-12-20
  Filled 2023-04-25: qty 30, 30d supply, fill #0

## 2023-04-25 MED ORDER — LIDOCAINE 5 % EX PTCH
1.0000 | MEDICATED_PATCH | CUTANEOUS | 0 refills | Status: DC
Start: 2023-04-25 — End: 2023-04-25
  Filled 2023-04-25: qty 30, 30d supply, fill #0

## 2023-04-25 MED ORDER — LIDOCAINE 5 % EX PTCH: 1.0000 | MEDICATED_PATCH | CUTANEOUS | 0 refills | Status: DC

## 2023-04-25 NOTE — Assessment & Plan Note (Signed)
Hospital discharge summary, labs  imaging studies and recommendations reviewed by me

## 2023-04-25 NOTE — Assessment & Plan Note (Signed)
Smokes about less than 0.5 pack/day  Asked about quitting: confirms that he/she currently smokes cigarettes Advise to quit smoking: Educated about QUITTING to reduce the risk of cancer, cardio and cerebrovascular disease. Assess willingness: Unwilling to quit at this time, but is working on cutting back. Assist with counseling and pharmacotherapy: Counseled for 5 minutes and literature provided. Arrange for follow up: follow up in 1-3 months and continue to offer help.

## 2023-04-25 NOTE — Assessment & Plan Note (Signed)
Currently does not have pain Would like lidocaine patch reordered Lidocaine 5%, 1 patch every 24 hours as needed refilled

## 2023-04-25 NOTE — Assessment & Plan Note (Signed)
Continue Humira 40 mg every 14 days Prednisone as ordered Maintain close follow-up with GI

## 2023-04-25 NOTE — Addendum Note (Signed)
Addended by: Donell Beers on: 04/25/2023 06:21 PM   Modules accepted: Orders

## 2023-04-25 NOTE — Assessment & Plan Note (Signed)
BP Readings from Last 3 Encounters:  04/25/23 130/60  04/19/23 (!) 143/67  03/28/23 139/83  Currently well-controlled on lisinopril 20 mg daily, HTN Controlled . Continue current medications. No changes in management. Discussed DASH diet and dietary sodium restrictions Continue to increase dietary efforts and exercise.

## 2023-04-25 NOTE — Patient Instructions (Addendum)
1. Cervical spondylosis  - lidocaine (LIDODERM) 5 %; Place 1 patch onto the skin daily. Remove & Discard patch within 12 hours or as directed by MD  Dispense: 30 patch; Refill: 0  2. Type 2 diabetes mellitus with diabetic neuropathy, without long-term current use of insulin (HCC)  - empagliflozin (JARDIANCE) 10 MG TABS tablet; Take 1 tablet (10 mg total) by mouth daily before breakfast.  Dispense: 30 tablet; Refill: 2     Goal for fasting blood sugar ranges from 80 to 120 and 2 hours after any meal or at bedtime should be between 130 to 170.      It is important that you exercise regularly at least 30 minutes 5 times a week as tolerated  Think about what you will eat, plan ahead. Choose " clean, green, fresh or frozen" over canned, processed or packaged foods which are more sugary, salty and fatty. 70 to 75% of food eaten should be vegetables and fruit. Three meals at set times with snacks allowed between meals, but they must be fruit or vegetables. Aim to eat over a 12 hour period , example 7 am to 7 pm, and STOP after  your last meal of the day. Drink water,generally about 64 ounces per day, no other drink is as healthy. Fruit juice is best enjoyed in a healthy way, by EATING the fruit.  Thanks for choosing [facility] we consider it a privelige to serve you.

## 2023-04-25 NOTE — Progress Notes (Addendum)
Established Patient Office Visit  Subjective:  Patient ID: Jared [name], male    DOB: 1965/12/08  Age: 57 y.o. MRN: [id number]  CC:  Chief Complaint  Patient presents with   Follow-up    Hospital Follow up.    HPI Jared [name] is a 57 y.o. male with past medical history of type 2 diabetes, hypertension, hyperlipidemia, Crohn's disease who presents for hospital discharge follow-up.  Patient was on admission at the hospital from 04/16/2023 to 04/19/2023 for acute Crohn's disease with intestinal obstruction. He was treated with  IV steroids while at the hospital. He is currently on Humira 40 mg injection every 14 days and prednisone.  Patient denies fever, chills, chest pain, shortness of breath, abdominal pain, nausea, vomiting, bloody stool, constipation, diarrhea.  States that he has upcoming appointment with GI in September,2024, he missed his last appointment with them.    Uncontrolled type 2 diabetes.  Currently on Tradjenta 5 mg daily, metformin 1000 mg twice daily.  Patient reports compliance with both medications.  Reports blood sugar readings of 113-125 at home.     Past Medical History:  Diagnosis Date   Crohn disease (HCC) 2008   with hospital admission in 2011, and 8/12 for flare up and SBO relieved with bowel rest. no suregery, no meds for CD   Diabetes mellitus type II    Hypertension     Past Surgical History:  Procedure Laterality Date   BOWEL RESECTION  01/25/2012   Procedure: SMALL BOWEL RESECTION;  Surgeon: Clovis Pu. 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 Cornett, MD;  Location: [facility] OR;  Service: General;  Laterality: N/A;   SCROTAL SURGERY  ~ 4   "took out a pellet"    Family History  Problem Relation Age of Onset   Cancer Mother        lung   Diabetes Mother    Alopecia Mother    Coronary  artery disease Mother    Diabetes Sister    Hyperlipidemia Sister    Hypertension Sister    Angina Brother    Hepatitis Brother    Alcohol abuse Brother    Diabetes Brother    Colon cancer Paternal Grandmother    Esophageal cancer Neg Hx    Stomach cancer Neg Hx    Rectal cancer Neg Hx     Social History   Socioeconomic History   Marital status: Single    Spouse name: Not on file   Number of children: 0   Years of education: Not on file   Highest education level: Not on file  Occupational History   Not on file  Tobacco Use   Smoking status: Every Day    Packs/day: 0.25    Years: 30.00    Additional pack years: 0.00    Total pack years: 7.50    Types: Cigarettes   Smokeless tobacco: Former    Types: Chew    Quit date: 01/20/1997  Vaping Use   Vaping Use: Never used  Substance and Sexual Activity   Alcohol use: Not Currently   Drug use: No   Sexual activity: Not Currently  Other Topics Concern   Not on file  Social History Narrative   Lives in [place].Is single. Has a sister in [place]. He has a twin brother that passed away from  autoimmune hepatitis.  Smokes 2-3 cigarettes a day. Drinks beer once every few months. No history of drug abuse. Studied up to 12th grade. Has orange card. No health insurance. Currently employed cleaning buildings.   Social Determinants of Health   Financial Resource Strain: Low Risk  (01/27/2023)   Overall Financial Resource Strain (CARDIA)    Difficulty of Paying Living Expenses: Not hard at all  Food Insecurity: No Food Insecurity (04/20/2023)   Hunger Vital Sign    Worried About Running Out of Food in the Last Year: Never true    Ran Out of Food in the Last Year: Never true  Transportation Needs: No Transportation Needs (04/20/2023)   PRAPARE - Administrator, Civil Service (Medical): No    Lack of Transportation (Non-Medical): No  Physical Activity: Insufficiently Active (01/27/2023)   Exercise Vital Sign    Days of  Exercise per Week: 2 days    Minutes of Exercise per Session: 20 min  Stress: No Stress Concern Present (01/27/2023)   [employer] of Occupational Health - Occupational Stress Questionnaire    Feeling of Stress : Not at all  Social Connections: Socially Isolated (01/27/2023)   Social Connection and Isolation Panel [NHANES]    Frequency of Communication with Friends and Family: More than three times a week    Frequency of Social Gatherings with Friends and Family: More than three times a week    Attends Religious Services: Never    Database administrator or Organizations: No    Attends Banker Meetings: Never    Marital Status: Never married  Intimate Partner Violence: Not At Risk (04/17/2023)   Humiliation, Afraid, Rape, and Kick questionnaire    Fear of Current or Ex-Partner: No    Emotionally Abused: No    Physically Abused: No    Sexually Abused: No    Outpatient Medications Prior to Visit  Medication Sig Dispense Refill   Adalimumab (HUMIRA PEN) 40 MG/0.4ML PNKT Inject 1 Pen into the skin every 14 (fourteen) days. (Patient taking differently: Inject 40 mg into the skin every 14 (fourteen) days.) 2 each 6   atorvastatin (LIPITOR) 10 MG tablet TAKE 1 TABLET BY MOUTH DAILY 30 tablet 10   linagliptin (TRADJENTA) 5 MG TABS tablet TAKE 1 TABLET BY MOUTH DAILY 90 tablet 10   lisinopril (ZESTRIL) 20 MG tablet TAKE 1 TABLET BY MOUTH DAILY 30 tablet 10   metFORMIN (GLUCOPHAGE) 1000 MG tablet TAKE ONE (1) TABLET BY MOUTH TWICE DAILY WITH A MEAL 60 tablet 10   omega-3 acid ethyl esters (LOVAZA) 1 g capsule Take 2 g by mouth 2 (two) times daily.     predniSONE (DELTASONE) 10 MG tablet Take 4 tablets (40 mg total) by mouth daily with breakfast for 7 days, THEN 3 tablets (30 mg total) daily with breakfast for 7 days, THEN 2 tablets (20 mg total) daily with breakfast for 14 days, THEN 1 tablet (10 mg total) daily with breakfast for 14 days. 91 tablet 0   polyethylene glycol (MIRALAX)  17 g packet Take 17 g and stir and dissolve in any 4 to 8 ounces of beverage (cold, hot or room temperature) then drink by mouth daily as needed for moderate constipation or severe constipation. (Patient not taking: Reported on 04/25/2023) 14 each 0   No facility-administered medications prior to visit.    Allergies  Allergen Reactions   Trazodone And Nefazodone     hallucinations    ROS Review of Systems  Constitutional:  Negative for activity change, appetite change, chills, diaphoresis, fatigue, fever and unexpected weight change.  HENT:  Negative for congestion, dental problem, drooling and ear discharge.   Eyes:  Negative for pain, discharge, redness and itching.  Respiratory:  Negative for apnea, cough, choking, chest tightness, shortness of breath and wheezing.   Cardiovascular: Negative.  Negative for chest pain, palpitations and leg swelling.  Gastrointestinal:  Negative for abdominal distention, abdominal pain, anal bleeding, blood in stool, constipation, diarrhea and vomiting.  Endocrine: Negative for polydipsia, polyphagia and polyuria.  Genitourinary:  Negative for difficulty urinating, flank pain, frequency and genital sores.  Musculoskeletal: Negative.  Negative for arthralgias, back pain, gait problem and joint swelling.  Skin:  Negative for color change, pallor and rash.  Neurological:  Negative for dizziness, facial asymmetry, light-headedness, numbness and headaches.  Psychiatric/Behavioral:  Negative for agitation, behavioral problems, confusion, hallucinations, self-injury, sleep disturbance and suicidal ideas.       Objective:    Physical Exam Vitals and nursing note reviewed.  Constitutional:      General: He is not in acute distress.    Appearance: Normal appearance. He is obese. He is not ill-appearing, toxic-appearing or diaphoretic.  HENT:     Mouth/Throat:     Mouth: Mucous membranes are moist.     Pharynx: Oropharynx is clear. No oropharyngeal exudate  or posterior oropharyngeal erythema.  Eyes:     General: No scleral icterus.       Right eye: No discharge.        Left eye: No discharge.     Extraocular Movements: Extraocular movements intact.     Conjunctiva/sclera: Conjunctivae normal.  Cardiovascular:     Rate and Rhythm: Normal rate and regular rhythm.     Pulses: Normal pulses.     Heart sounds: Normal heart sounds. No murmur heard.    No friction rub. No gallop.  Pulmonary:     Effort: Pulmonary effort is normal. No respiratory distress.     Breath sounds: Normal breath sounds. No stridor. No wheezing, rhonchi or rales.  Chest:     Chest wall: No tenderness.  Abdominal:     General: There is no distension.     Palpations: Abdomen is soft.     Tenderness: There is no abdominal tenderness. There is no right CVA tenderness, left CVA tenderness or guarding.  Musculoskeletal:        General: No swelling, tenderness, deformity or signs of injury.     Right lower leg: No edema.     Left lower leg: No edema.  Skin:    General: Skin is warm and dry.     Capillary Refill: Capillary refill takes 2 to 3 seconds.     Coloration: Skin is not jaundiced or pale.     Findings: No bruising, erythema or lesion.  Neurological:     Mental Status: He is alert and oriented to person, place, and time.     Motor: No weakness.     Coordination: Coordination normal.     Gait: Gait normal.  Psychiatric:        Mood and Affect: Mood normal.        Behavior: Behavior normal.        Thought Content: Thought content normal.        Judgment: Judgment normal.     BP 130/60   Pulse 86   Temp (!) 97.2 F (36.2 C)   Ht 6\' 2"  (1.88 m)   Wt 210  lb 12.8 oz (95.6 kg)   SpO2 100%   BMI 27.07 kg/m  Wt Readings from Last 3 Encounters:  04/25/23 210 lb 12.8 oz (95.6 kg)  04/16/23 220 lb (99.8 kg)  03/28/23 220 lb 0.3 oz (99.8 kg)    No results found for: "TSH" Lab Results  Component Value Date   WBC 8.9 04/19/2023   HGB 11.3 (L) 04/19/2023    HCT 35.2 (L) 04/19/2023   MCV 86.7 04/19/2023   PLT 405 (H) 04/19/2023   Lab Results  Component Value Date   NA 138 04/19/2023   K 4.2 04/19/2023   CO2 25 04/19/2023   GLUCOSE 190 (H) 04/19/2023   BUN 13 04/19/2023   CREATININE 0.86 04/19/2023   BILITOT 0.2 (L) 04/19/2023   ALKPHOS 54 04/19/2023   AST 14 (L) 04/19/2023   ALT 19 04/19/2023   PROT 6.6 04/19/2023   ALBUMIN 3.0 (L) 04/19/2023   CALCIUM 8.3 (L) 04/19/2023   ANIONGAP 6 04/19/2023   EGFR 72 01/18/2023   Lab Results  Component Value Date   CHOL 108 01/18/2023   Lab Results  Component Value Date   HDL 34 (L) 01/18/2023   Lab Results  Component Value Date   LDLCALC 54 01/18/2023   Lab Results  Component Value Date   TRIG 107 01/18/2023   Lab Results  Component Value Date   CHOLHDL 3.2 01/18/2023   Lab Results  Component Value Date   HGBA1C 8.3 (H) 04/18/2023      Assessment & Plan:   Problem List Items Addressed This Visit       Cardiovascular and Mediastinum   Essential hypertension    BP Readings from Last 3 Encounters:  04/25/23 130/60  04/19/23 (!) 143/67  03/28/23 139/83  Currently well-controlled on lisinopril 20 mg daily, HTN Controlled . Continue current medications. No changes in management. Discussed DASH diet and dietary sodium restrictions Continue to increase dietary efforts and exercise.           Digestive   Acute Crohn's disease with intestinal obstruction (HCC) - Primary    Continue Humira 40 mg every 14 days Prednisone as ordered Maintain close follow-up with GI        Endocrine   Type 2 diabetes mellitus without complication, without long-term current use of insulin (HCC)    Lab Results  Component Value Date   HGBA1C 8.3 (H) 04/18/2023  Chronic uncontrolled condition Currently tradjenta 5 mg daily, metformin 1000 mg twice daily Start Jardiance 10 mg daily, he denies history of pancreatitis, medullary thyroid cancer.  Patient told to report abdominal pain,  nausea, vomiting. Patient counseled on low-carb modified diet CBG goals discussed, call the office with CBG readings less than 70 Follow-up with PCP in 4 weeks      Relevant Medications   empagliflozin (JARDIANCE) 10 MG TABS tablet     Musculoskeletal and Integument   Degenerative joint disease of shoulder region    Currently does not have pain Would like lidocaine patch reordered Lidocaine 5%, 1 patch every 24 hours as needed refilled       Relevant Medications   lidocaine (LIDODERM) 5 %     Other   Tobacco user    Smokes about less than 0.5 pack/day  Asked about quitting: confirms that he/she currently smokes cigarettes Advise to quit smoking: Educated about QUITTING to reduce the risk of cancer, cardio and cerebrovascular disease. Assess willingness: Unwilling to quit at this time, but is working on cutting back. Assist  with counseling and pharmacotherapy: Counseled for 5 minutes and literature provided. Arrange for follow up: follow up in 1-3 months and continue to offer help.       Hospital discharge follow-up    Hospital discharge summary, labs  imaging studies and recommendations reviewed by me       Meds ordered this encounter  Medications   DISCONTD: lidocaine (LIDODERM) 5 %    Sig: Place 1 patch onto the skin daily. Remove & Discard patch within 12 hours or as directed by MD    Dispense:  30 patch    Refill:  0   empagliflozin (JARDIANCE) 10 MG TABS tablet    Sig: Take 1 tablet (10 mg total) by mouth daily before breakfast.    Dispense:  30 tablet    Refill:  2   lidocaine (LIDODERM) 5 %    Sig: Place 1 patch onto the skin daily. Remove & Discard patch within 12 hours or as directed by MD    Dispense:  30 patch    Refill:  0    Follow-up: Return in about 4 weeks (around 05/23/2023) for DM.    Donell Beers, FNP

## 2023-04-25 NOTE — Assessment & Plan Note (Addendum)
Lab Results  Component Value Date   HGBA1C 8.3 (H) 04/18/2023  Chronic uncontrolled condition Currently tradjenta 5 mg daily, metformin 1000 mg twice daily Start Jardiance 10 mg daily, he denies history of pancreatitis, medullary thyroid cancer.  Patient told to report abdominal pain, nausea, vomiting. Patient counseled on low-carb modified diet CBG goals discussed, call the office with CBG readings less than 70 Follow-up with PCP in 4 weeks

## 2023-04-25 NOTE — Assessment & Plan Note (Deleted)
Currently does not have pain Would like lidocaine patch reordered Lidocaine 5%, 1 patch every 24 hours as needed refilled

## 2023-04-26 ENCOUNTER — Other Ambulatory Visit: Payer: Self-pay

## 2023-04-27 ENCOUNTER — Other Ambulatory Visit: Payer: Self-pay

## 2023-04-29 ENCOUNTER — Other Ambulatory Visit: Payer: Self-pay

## 2023-04-29 ENCOUNTER — Other Ambulatory Visit: Payer: Self-pay | Admitting: Family Medicine

## 2023-04-29 DIAGNOSIS — F419 Anxiety disorder, unspecified: Secondary | ICD-10-CM

## 2023-05-02 ENCOUNTER — Other Ambulatory Visit: Payer: Self-pay

## 2023-05-02 MED ORDER — HYDROXYZINE HCL 10 MG PO TABS
10.0000 mg | ORAL_TABLET | Freq: Three times a day (TID) | ORAL | 5 refills | Status: AC | PRN
Start: 2023-05-02 — End: ?
  Filled 2023-05-02 – 2023-09-09 (×2): qty 30, 10d supply, fill #0

## 2023-05-02 NOTE — Telephone Encounter (Signed)
Please advise [facility] 

## 2023-05-09 ENCOUNTER — Other Ambulatory Visit: Payer: Self-pay

## 2023-06-23 ENCOUNTER — Other Ambulatory Visit: Payer: Self-pay | Admitting: Gastroenterology

## 2023-06-23 DIAGNOSIS — K50019 Crohn's disease of small intestine with unspecified complications: Secondary | ICD-10-CM

## 2023-06-24 ENCOUNTER — Other Ambulatory Visit: Payer: Self-pay

## 2023-06-24 DIAGNOSIS — K50019 Crohn's disease of small intestine with unspecified complications: Secondary | ICD-10-CM

## 2023-06-24 MED ORDER — HUMIRA (2 PEN) 40 MG/0.4ML ~~LOC~~ AJKT
40.0000 mg | AUTO-INJECTOR | SUBCUTANEOUS | 11 refills | Status: DC
Start: 2023-06-24 — End: 2023-12-26

## 2023-07-19 ENCOUNTER — Ambulatory Visit: Payer: Self-pay | Admitting: Family Medicine

## 2023-07-20 ENCOUNTER — Encounter: Payer: Self-pay | Admitting: Family Medicine

## 2023-07-20 ENCOUNTER — Telehealth: Payer: Self-pay | Admitting: Family Medicine

## 2023-07-20 NOTE — Telephone Encounter (Signed)
Pt called requesting a note to be excused from jury duty due to Chrone's Diseases dx

## 2023-07-22 ENCOUNTER — Encounter (INDEPENDENT_AMBULATORY_CARE_PROVIDER_SITE_OTHER): Payer: 59 | Admitting: Ophthalmology

## 2023-07-29 ENCOUNTER — Encounter (INDEPENDENT_AMBULATORY_CARE_PROVIDER_SITE_OTHER): Payer: 59 | Admitting: Ophthalmology

## 2023-08-10 NOTE — Progress Notes (Shared)
[facility] - Clinic Note  08/15/2023   CHIEF COMPLAINT Patient presents for Retina Evaluation  HISTORY OF PRESENT ILLNESS: Jared [name] is a 57 y.o. male who presents to the clinic today for:  HPI     Retina Evaluation   In both eyes.  This started 2 weeks ago.  Duration of 2 weeks.  I, the attending physician,  performed the HPI with the patient and updated documentation appropriately.        Comments   2 week retina follow up DM eval per Dr Bascom Levels pt is reporting no vision changes noticed he denies flashes or floaters his last reading was Saturday 150 A1C 9 little over a week ago       Last edited by Rennis Chris, MD on 08/17/2023  8:04 PM.    Pt is here on the referral of Dr. Bascom Levels for DME, pt saw Dr. Bascom Levels for a routine eye exam, pts last A1c was 8.3 in June, which he says is high for him, he has been on steroids for Crohn's disease, he states his dr wants him to start Cristy Folks, but he has to get it approved through insurance   Referring physician: Bascom Levels, [place], OD [address] [place],  [place] [address]  HISTORICAL INFORMATION:  Selected notes from the MEDICAL RECORD NUMBER Referred by Dr. Bascom Levels for baseline retinal eval -- +DME LEE:  Ocular Hx- PMH-   CURRENT MEDICATIONS: No current outpatient medications on file. (Ophthalmic Drugs)   No current facility-administered medications for this visit. (Ophthalmic Drugs)   Current Outpatient Medications (Other)  Medication Sig   atorvastatin (LIPITOR) 10 MG tablet TAKE 1 TABLET BY MOUTH DAILY   empagliflozin (JARDIANCE) 10 MG TABS tablet Take 1 tablet (10 mg total) by mouth daily before breakfast.   hydrOXYzine (ATARAX) 10 MG tablet Take 1 tablet (10 mg total) by mouth 3 (three) times daily as needed.   lidocaine (LIDODERM) 5 % Place 1 patch onto the skin daily. Remove & Discard patch within 12 hours or as directed by MD   linagliptin (TRADJENTA) 5 MG TABS tablet TAKE 1 TABLET BY  MOUTH DAILY   lisinopril (ZESTRIL) 20 MG tablet TAKE 1 TABLET BY MOUTH DAILY   metFORMIN (GLUCOPHAGE) 1000 MG tablet TAKE ONE (1) TABLET BY MOUTH TWICE DAILY WITH A MEAL   omega-3 acid ethyl esters (LOVAZA) 1 g capsule Take 2 g by mouth 2 (two) times daily.   Risankizumab-rzaa (SKYRIZI) 360 MG/2.4ML SOCT Inject 360 mg into the skin every 8 (eight) weeks.   adalimumab (HUMIRA, 2 PEN,) 40 MG/0.4ML pen Inject 0.4 mLs (40 mg total) into the skin every 14 (fourteen) days. (Patient not taking: Reported on 08/15/2023)   No current facility-administered medications for this visit. (Other)   REVIEW OF SYSTEMS: ROS   Positive for: Gastrointestinal, Endocrine Last edited by Etheleen Mayhew, COT on 08/15/2023  8:40 AM.     ALLERGIES Allergies  Allergen Reactions   Trazodone And Nefazodone     hallucinations   PAST MEDICAL HISTORY Past Medical History:  Diagnosis Date   Crohn disease (HCC) 2008   with hospital admission in 2011, and 8/12 for flare up and SBO relieved with bowel rest. no suregery, no meds for CD   Diabetes mellitus type II    Hypertension    Past Surgical History:  Procedure Laterality Date   BOWEL RESECTION  01/25/2012   Procedure: SMALL BOWEL RESECTION;  Surgeon: Clovis Pu. 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 Cornett, MD;  Location: [facility] OR;  Service: General;  Laterality: N/A;   SCROTAL SURGERY  ~ 5   "took out a pellet"   FAMILY HISTORY Family History  Problem Relation Age of Onset   Diabetes Mother    Alopecia Mother    Coronary artery disease Mother    Lung cancer Mother        smoker but had quit   Dementia Father    Diabetes Sister    Hyperlipidemia Sister    Hypertension Sister    Angina Brother    Hepatitis Brother    Alcohol abuse Brother    Diabetes Brother    Colon cancer  Paternal Grandmother    Esophageal cancer Neg Hx    Stomach cancer Neg Hx    Rectal cancer Neg Hx    SOCIAL HISTORY Social History   Tobacco Use   Smoking status: Some Days    Current packs/day: 0.25    Average packs/day: 0.3 packs/day for 30.0 years (7.5 ttl pk-yrs)    Types: Cigarettes   Smokeless tobacco: Former    Types: Chew    Quit date: 01/20/1997  Vaping Use   Vaping status: Never Used  Substance Use Topics   Alcohol use: Not Currently   Drug use: No       OPHTHALMIC EXAM:  Base Eye Exam     Visual Acuity (Snellen - Linear)       Right Left   Dist [place] 20/40 20/30   Dist ph [place] 20/25 -1 20/25         Tonometry (Tonopen, 8:59 AM)       Right Left   Pressure 13 14         Pupils       Pupils Dark Light Shape React APD   Right PERRL 4 3 Round Brisk None   Left PERRL 4 3 Round Brisk None         Visual Fields       Left Right    Full Full         Extraocular Movement       Right Left    Full, Ortho Full, Ortho         Neuro/Psych     Oriented x3: Yes   Mood/Affect: Normal         Dilation     Both eyes: 2.5% Phenylephrine @ 8:59 AM           Slit Lamp and Fundus Exam     Slit Lamp Exam       Right Left   Lids/Lashes Normal Normal   Conjunctiva/Sclera mild melanosis mild melanosis   Cornea trace PEE mild arcus, trace tear film debris   Anterior Chamber deep, clear, narrow angles deep, clear, narrow angles   Iris Round and dilated, No NVI Round and dilated, No NVI   Lens 2+ Nuclear sclerosis, 2+ Cortical cataract 2+ Nuclear sclerosis, 3+ Cortical cataract   Anterior Vitreous mild syneresis mild syneresis         Fundus Exam       Right Left   Disc Pink and Sharp Pink and Sharp   C/D Ratio 0.6 0.3   Macula Flat, Good foveal reflex, rare MA, +cystic changes temporal fovea Flat, Good foveal reflex,  No heme or edema   Vessels attenuated, mild tortuosity attenuated, mild tortuosity, mild AV crossing changes    Periphery Attached, rare MA Attached, punctate CWS IT to disc           Refraction     Manifest Refraction       Sphere Cylinder Axis Dist VA   Right +1.25 +0.75 180 20/20-1   Left +1.75 +1.00 030 20/25-1           IMAGING AND PROCEDURES  Imaging and Procedures for 08/15/2023  OCT, Retina - OU - Both Eyes       Right Eye Quality was good. Central Foveal Thickness: 272. Progression has no prior data. Findings include normal foveal contour, no SRF, intraretinal fluid, vitreomacular adhesion (Focal cyst temporal fovea).   Left Eye Quality was good. Central Foveal Thickness: 268. Progression has no prior data. Findings include normal foveal contour, no IRF, no SRF, vitreomacular adhesion .   Notes *Images captured and stored on drive  Diagnosis / Impression:  NFP, no IRF/SRF OU OD: focal cyst temporal fovea  Clinical management:  See below  Abbreviations: NFP - Normal foveal profile. CME - cystoid macular edema. PED - pigment epithelial detachment. IRF - intraretinal fluid. SRF - subretinal fluid. EZ - ellipsoid zone. ERM - epiretinal membrane. ORA - outer retinal atrophy. ORT - outer retinal tubulation. SRHM - subretinal hyper-reflective material. IRHM - intraretinal hyper-reflective material 272          ASSESSMENT/PLAN:   ICD-10-CM   1. 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 Long term (current) use of oral hypoglycemic drugs  Z79.84     3. Long-term (current) use of injectable non-insulin antidiabetic drugs  Z79.85     4. Essential hypertension  I10     5. Hypertensive retinopathy of both eyes  H35.033     6. Combined forms of age-related cataract of both eyes  H25.813      1-3. Mild Non-proliferative diabetic retinopathy, both eyes  - A1c: 8.3 in June 2024 - The incidence, risk factors for progression, natural  history and treatment options for diabetic retinopathy were discussed with patient.   - The need for close monitoring of blood glucose, blood pressure, and serum lipids, avoiding cigarette or any type of tobacco, and the need for long term follow up was also discussed with patient. - exam shows rare MA, +cystic changes temporal fovea OD - OCT shows focal cyst temporal fovea / diabetic macular edema, right eye  - The natural history, pathology, and characteristics of diabetic macular edema discussed with patient.  A generalized discussion of the major clinical trials concerning treatment of diabetic macular edema (ETDRS, DCT, SCORE, RISE / RIDE, and ongoing DRCR net studies) was completed.  This discussion included mention of the various approaches to treating diabetic macular edema (observation, laser photocoagulation, anti-VEGF injections with lucentis / Avastin / Eylea, steroid injections with Kenalog / Ozurdex, and intraocular surgery with vitrectomy).  The goal hemoglobin A1C of 6-7 was discussed, as well as importance of smoking cessation and hypertension control.  Need for ongoing treatment and monitoring were specifically discussed with reference to chronic nature of diabetic macular edema. - no ophthalmic intervention recommended today - f/u in 3-4 months -- DFE/OCT/FA (transit OD), possible injection  4,5. Hypertensive retinopathy OU - discussed importance of tight BP control - monitor  6. Mixed Cataract OU - The symptoms of cataract, surgical options, and treatments and risks were discussed with patient. - discussed diagnosis and progression - monitor  Ophthalmic Meds Ordered this visit:  No orders of the defined types were placed in this encounter.    Return for f/u 3-4 months, NPDR OU, DFE, OCT.  There are no Patient Instructions on file for this visit.   This document serves as a record of services personally performed by Karie Chimera, MD, PhD. It was created on their behalf  by Glee Arvin. Manson Passey, OA an ophthalmic technician. The creation of this record is the provider's dictation and/or activities during the visit.    Electronically signed by: Glee Arvin. Manson Passey, OA 08/17/23 8:09 PM  Karie Chimera, M.D., Ph.D. Diseases & Surgery of the Retina and Vitreous [facility] 08/15/2023   I have reviewed the above documentation for accuracy and completeness, and I agree with the above. Karie Chimera, M.D., Ph.D. 08/17/23 8:09 PM   Abbreviations: M myopia (nearsighted); A astigmatism; H hyperopia (farsighted); P presbyopia; Mrx spectacle prescription;  CTL contact lenses; OD right eye; OS left eye; OU both eyes  XT exotropia; ET esotropia; PEK punctate epithelial keratitis; PEE punctate epithelial erosions; DES dry eye syndrome; MGD meibomian gland dysfunction; ATs artificial tears; PFAT's preservative free artificial tears; NSC nuclear sclerotic cataract; PSC posterior subcapsular cataract; ERM epi-retinal membrane; PVD posterior vitreous detachment; RD retinal detachment; DM diabetes mellitus; DR diabetic retinopathy; NPDR non-proliferative diabetic retinopathy; PDR proliferative diabetic retinopathy; CSME clinically significant macular edema; DME diabetic macular edema; dbh dot blot hemorrhages; CWS cotton wool spot; POAG primary open angle glaucoma; C/D cup-to-disc ratio; HVF humphrey visual field; GVF goldmann visual field; OCT optical coherence tomography; IOP intraocular pressure; BRVO Branch retinal vein occlusion; CRVO central retinal vein occlusion; CRAO central retinal artery occlusion; BRAO branch retinal artery occlusion; RT retinal tear; SB scleral buckle; PPV pars plana vitrectomy; VH Vitreous hemorrhage; PRP panretinal laser photocoagulation; IVK intravitreal kenalog; VMT vitreomacular traction; MH Macular hole;  NVD neovascularization of the disc; NVE neovascularization elsewhere; AREDS age related eye disease study; ARMD age related macular  degeneration; POAG primary open angle glaucoma; EBMD epithelial/anterior basement membrane dystrophy; ACIOL anterior chamber intraocular lens; IOL intraocular lens; PCIOL posterior chamber intraocular lens; Phaco/IOL phacoemulsification with intraocular lens placement; PRK photorefractive keratectomy; LASIK laser assisted in situ keratomileusis; HTN hypertension; DM diabetes mellitus; COPD chronic obstructive pulmonary disease

## 2023-08-11 ENCOUNTER — Other Ambulatory Visit: Payer: 59

## 2023-08-11 ENCOUNTER — Encounter: Payer: Self-pay | Admitting: Gastroenterology

## 2023-08-11 ENCOUNTER — Ambulatory Visit: Payer: 59 | Admitting: Gastroenterology

## 2023-08-11 VITALS — BP 154/60 | HR 99 | Ht 74.0 in | Wt 207.0 lb

## 2023-08-11 DIAGNOSIS — K50012 Crohn's disease of small intestine with intestinal obstruction: Secondary | ICD-10-CM | POA: Diagnosis not present

## 2023-08-11 LAB — C-REACTIVE PROTEIN: CRP: <1 mg/dL (ref 0.5–20.0)

## 2023-08-11 NOTE — Patient Instructions (Addendum)
Your provider has requested that you go to the basement level for lab work before leaving today. Press "B" on the elevator. The lab is located at the first door on the left as you exit the elevator.  We have sent the following medications to your pharmacy for you to pick up at your convenience: Skyrizi.  Please get the shingles vaccine, flu vaccine and pneumonia vaccine.  _______________________________________________________  If your blood pressure at your visit was 140/90 or greater, please contact your primary care physician to follow up on this.  _______________________________________________________  If you are age 5 or older, your body mass index should be between 23-30. Your Body mass index is 26.58 kg/m. If this is out of the aforementioned range listed, please consider follow up with your Primary Care Provider.  If you are age 50 or younger, your body mass index should be between 19-25. Your Body mass index is 26.58 kg/m. If this is out of the aformentioned range listed, please consider follow up with your Primary Care Provider.   ________________________________________________________  The [facility] providers would like to encourage you to use [facility] to communicate with providers for non-urgent requests or questions.  Due to long hold times on the telephone, sending your provider a message by [facility] may be a faster and more efficient way to get a response.  Please allow 48 business hours for a response.  Please remember that this is for non-urgent requests.   It was a pleasure to see you today!  Thank you for trusting me with your gastrointestinal care!     Scott E.Cunningham,MD

## 2023-08-11 NOTE — Progress Notes (Signed)
HPI : Jared [name] is a 57 y.o. male with a history of Crohn's disease, diabetes and chronic tobacco use who presents to our clinic for follow-up after hospitalization in June of this year.  He was previously followed by Dr. Orvan Falconer.  He has a history of a small bowel resection/ileocecectomy in 2013.  He had been on budesonide for a period in 2021 without response. He was started on Humira in January 2022, but discontinued it in April due to lack of health insurance.  He was seen in our clinic August 12, 2022 at which time he had been off meds since April 2022.  A CTE was performed which showed persistent ileal thickening and luminal narrowing as well as mild dilation of several loops of distal small bowel, concerning for low-grade obstruction.  He was given a course of steroids at that time and was restarted on Humira (September 21, 2022-present).  He was also referred to surgery but did not keep his appointment due to flulike symptoms.  Adalimumab levels were checked in December and were low at 0.8 mcg/mL, with low titer antibodies detected (97 ng/mL).  It was not clear if there were any recommendations to adjust dose or add immunomodulator at that time, based on my review of the EMR.  The patient denies being told to make any changes to his Humira.  He was admitted to the hospital for June 1-4, 2024 with Crohn's flare symptoms (abdominal pain).  CT showed inflammatory changes in the right lower quadrant and some mildly dilated loops of small bowel.  He was started on IV steroids with prompt improvement in his symptoms and discharged home with steroid taper.  He was scheduled for a follow-up visit later that month, but he did not show up.  Adalimumab levels taken at the time of admission were undetectable and that he had intermediate titer antibodies (292).  He denies missing any Humira doses leading up to his admission, but also states that he has had trouble getting his medications mailed to him on  time.   Today, the patient reports feeling okay.  He was having sharp abdominal pain for about 2 weeks which improved about a week ago.  He went to his PCP and was given pain medications and muscle relaxers.  This pain was more severe than the pain in June.  It was localized to the RLQ and worsened with any movement or activity.  No nausea/vomiting but he felt very bloated with abdominal distention.  His appetite remained good and he had no problems eating or drinking.  His bowel movements have been at his baseline which is about 4 per day, with semiformed stools, no blood.  He did not take the full course of steroids that was prescribed at discharge.  He took the 40 mg for a week and the 30 mg for a week, but then he was having dizzy spells.  He talked to his PCP, who he says told him to stop taking the steroids. He states that he is still taking his Humira every 2 weeks.    Resumed Humira when seen by Alcide Evener 08/12/2022 with ongoing intermittent RLQ pain and bloat.     CTE 10//23 showed persistent stricture involving 7cm of the terminal ileum and suspected low-grade SBO.   No extraGI manifestations of IBD.      Prior Lab Summary: Fecal calprotectin 03/07/2020 was 211 and increased to 407 on 05/28/20.   Labs 03/04/2020 show hemoglobin A1c of 9.1 Labs 04/09/2020  showed iron 46, ferritin 10.1, CRP 6.180, ESR 57.  Screening for chronic hepatitis B and C was negative.  Quant interferon TB Gold negative.  Testing for EBV RSV and Covid negative.  He has antibodies to varicella. Labs 05/27/2020 showing iron of 47, ferritin 166.9 Labs 05/28/20: fecal calprotectin 407 Labs 09/02/20: normal CMP except for glucose 193 Labs 07/20/22: normal CMP except for glucose 186 Labs 08/13/22: GI pathogen panel positive for Enteropathogenic E coli, fecal calprotectin 1680   Hospitalized for Crohn's flare 6/1-6/4, treated with IV steroids with prompt clinical improvement, discharged on PO steroids Labs:   04/18/23:  ADA level undetectable, ADA Ab 292 (intermediate); CRP 10-> 6-> 1.7 (3 days IV steroids) TPMT 22 (normal), hepatitis surface antigen/core antibody nonreactive, QTF negative       CT scan abd/pelvis June 2024 (hospitalized, treated with IV steroids) IMPRESSION: 1. 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 Tethered appearance of small bowel in the right lower quadrant where there is associated small bowel wall thickening and perienteric fat stranding, consistent with active Crohn's disease. Additional inflamed small bowel in the left pelvis. No perforation or abscess. 2. Mildly dilated small bowel in the pelvis may be secondary to ileus or developing small bowel obstruction. 3. Questionable areas of wall thickening involving the proximal sigmoid colon, although this may be reactive. 4. Cholelithiasis without gallbladder inflammation. 5. Bilateral nonobstructing nephrolithiasis  CT Enterography Aug 19, 2022 IMPRESSION: Persistent stricture involving the terminal ileum over length of approximately 7 cm, with tethering of adjacent small bowel loops. New mild dilatation of several distal small bowel loops proximal to the stricture, suspicious for low-grade small bowel obstruction. No imaging signs of active small bowel inflammation or penetrating disease. Cholelithiasis. No radiographic evidence of cholecystitis.  Bilateral nephrolithiasis. No evidence of ureteral calculi or hydronephrosis.  CT abd/pelvis February 18, 2020 IMPRESSION: 1. Surgical changes in the region of the ileocecal junction. Two loops of small bowel which are intimately associated with the each other and the adjacent cecum. This appearance is similar to on the prior. Could be postoperative, related to an end to side anastomosis. 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 Although the more anterior portion of the neoterminal ileum is underdistended, felt to be  thick walled. Active Crohn disease cannot be excluded. Other areas of possible low-grade bowel obstruction versus focal ileus within the proximal transverse duodenum and mid jejunum. No convincing evidence of active enteritis in these regions. 2. Developing abdominal adenopathy, likely reactive. 3. Cholelithiasis.  CT Enterography Jan 03, 2019 IMPRESSION: 1. No CT findings for active Crohn's disease. 2. Stable surgical changes with ileocolonic anastomosis. There are some scarring changes in the adjacent small bowel mesentery but I do not see any evidence or obstruction. 3. Stable cluster of lymph nodes in the small bowel mesentery but no mass or overt adenopathy. 4. No acute abdominal findings or mass lesions. 5. Stable bilateral renal calculi but no obstructing ureteral calculi. Cholelithiasis but no CT findings for acute cholecystitis.  Colonoscopy March 2020 (Dr. Orvan Falconer) - The perianal and digital rectal examinations were normal.  - The colon (entire examined portion) appeared normal except for a very mild patch of erythema in the descending colon. Biopsies were taken with a cold forceps for histology throughout the entire colon. Estimated blood loss was minimal.  - There was evidence of a non-patent end-to-side ileo-colonic anastomosis found in the neoterminal Ileum. This was characterized by edema, friable mucosa and ulceration. This could not be traversed. Biopsies were  taken with a cold forceps for histology. Estimated blood loss was minimal.  - The exam was otherwise without abnormality on direct and retroflexion views.   EGD Jan 17, 2019 (Dr. Orvan Falconer) - Normal esophagus.   - Non-bleeding erosive gastropathy. Biopsied.   - Nodular mucosa in the duodenal bulb. Biopsied.  - The examination was otherwise normal.  Past Medical History:  Diagnosis Date   Crohn disease (HCC) 2008   with hospital admission in 2011, and 8/12 for flare up and SBO relieved with bowel rest. no  suregery, no meds for CD   Diabetes mellitus type II    Hypertension      Past Surgical History:  Procedure Laterality Date   BOWEL RESECTION  01/25/2012   Procedure: SMALL BOWEL RESECTION;  Surgeon: Clovis Pu. 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 Cornett, MD;  Location: [facility] OR;  Service: General;  Laterality: N/A;   SCROTAL SURGERY  ~ 81   "took out a pellet"   Family History  Problem Relation Age of Onset   Cancer Mother        lung   Diabetes Mother    Alopecia Mother    Coronary artery disease Mother    Diabetes Sister    Hyperlipidemia Sister    Hypertension Sister    Angina Brother    Hepatitis Brother    Alcohol abuse Brother    Diabetes Brother    Colon cancer Paternal Grandmother    Esophageal cancer Neg Hx    Stomach cancer Neg Hx    Rectal cancer Neg Hx    Social History   Tobacco Use   Smoking status: Every Day    Current packs/day: 0.25    Average packs/day: 0.3 packs/day for 30.0 years (7.5 ttl pk-yrs)    Types: Cigarettes   Smokeless tobacco: Former    Types: Chew    Quit date: 01/20/1997  Vaping Use   Vaping status: Never Used  Substance Use Topics   Alcohol use: Not Currently   Drug use: No   Current Outpatient Medications  Medication Sig Dispense Refill   adalimumab (HUMIRA, 2 PEN,) 40 MG/0.4ML pen Inject 0.4 mLs (40 mg total) into the skin every 14 (fourteen) days. 2 each 11   atorvastatin (LIPITOR) 10 MG tablet TAKE 1 TABLET BY MOUTH DAILY 30 tablet 10   empagliflozin (JARDIANCE) 10 MG TABS tablet Take 1 tablet (10 mg total) by mouth daily before breakfast. 30 tablet 2   hydrOXYzine (ATARAX) 10 MG tablet Take 1 tablet (10 mg total) by mouth 3 (three) times daily as needed. 30 tablet 5   lidocaine (LIDODERM) 5 % Place 1 patch onto the skin daily. Remove & Discard patch within 12  hours or as directed by MD 30 patch 0   linagliptin (TRADJENTA) 5 MG TABS tablet TAKE 1 TABLET BY MOUTH DAILY 90 tablet 10   lisinopril (ZESTRIL) 20 MG tablet TAKE 1 TABLET BY MOUTH DAILY 30 tablet 10   metFORMIN (GLUCOPHAGE) 1000 MG tablet TAKE ONE (1) TABLET BY MOUTH TWICE DAILY WITH A MEAL 60 tablet 10   omega-3 acid ethyl esters (LOVAZA) 1 g capsule Take 2 g by mouth 2 (two) times daily.     polyethylene glycol (MIRALAX) 17 g packet Take 17 g and stir and dissolve in any 4 to  8 ounces of beverage (cold, hot or room temperature) then drink by mouth daily as needed for moderate constipation or severe constipation. (Patient not taking: Reported on 04/25/2023) 14 each 0   No current facility-administered medications for this visit.   Allergies  Allergen Reactions   Trazodone And Nefazodone     hallucinations     Review of Systems: All systems reviewed and negative except where noted in HPI.    No results found.  Physical Exam: BP (!) 154/60   Pulse 99   Ht 6\' 2"  (1.88 m)   Wt 207 lb (93.9 kg)   BMI 26.58 kg/m  Constitutional: Pleasant,well-developed, African-American male in no acute distress. HEENT: Normocephalic and atraumatic. Conjunctivae are normal. No scleral icterus. Neck supple.  Cardiovascular: Normal rate, regular rhythm.  Pulmonary/chest: Effort normal and breath sounds normal. No wheezing, rales or rhonchi. Abdominal: Soft, slightly distended, to palpation in the right lower quadrant. Bowel sounds active throughout. There are no masses palpable. No hepatomegaly.  Diastases recti noted Extremities: no edema Neurological: Alert and oriented to person place and time. Skin: Skin is warm and dry. No rashes noted. Psychiatric: Normal mood and affect. Behavior is normal.  CBC    Component Value Date/Time   WBC 8.9 04/19/2023 0438   RBC 4.06 (L) 04/19/2023 0438   HGB 11.3 (L) 04/19/2023 0438   HCT 35.2 (L) 04/19/2023 0438   PLT 405 (H) 04/19/2023 0438   MCV 86.7  04/19/2023 0438   MCH 27.8 04/19/2023 0438   MCHC 32.1 04/19/2023 0438   RDW 14.6 04/19/2023 0438   LYMPHSABS 0.9 04/19/2023 0438   MONOABS 0.4 04/19/2023 0438   EOSABS 0.0 04/19/2023 0438   BASOSABS 0.0 04/19/2023 0438    CMP     Component Value Date/Time   NA 138 04/19/2023 0438   NA 143 01/18/2023 1132   K 4.2 04/19/2023 0438   CL 107 04/19/2023 0438   CO2 25 04/19/2023 0438   GLUCOSE 190 (H) 04/19/2023 0438   BUN 13 04/19/2023 0438   BUN 11 01/18/2023 1132   CREATININE 0.86 04/19/2023 0438   CREATININE 0.98 07/06/2017 0937   CALCIUM 8.3 (L) 04/19/2023 0438   PROT 6.6 04/19/2023 0438   PROT 6.6 01/18/2023 1132   ALBUMIN 3.0 (L) 04/19/2023 0438   ALBUMIN 3.9 01/18/2023 1132   AST 14 (L) 04/19/2023 0438   ALT 19 04/19/2023 0438   ALKPHOS 54 04/19/2023 0438   BILITOT 0.2 (L) 04/19/2023 0438   BILITOT 0.4 01/18/2023 1132   GFRNONAA >60 04/19/2023 0438   GFRNONAA 89 07/06/2017 0937   GFRAA 96 09/02/2020 1031   GFRAA >89 07/06/2017 0937       Latest Ref Rng & Units 04/19/2023    4:38 AM 04/18/2023    7:56 AM 04/17/2023    1:01 AM  CBC EXTENDED  WBC 4.0 - 10.5 K/uL 8.9  9.9  12.7   RBC 4.22 - 5.81 MIL/uL 4.06  4.09  4.37   Hemoglobin 13.0 - 17.0 g/dL 19.1  47.8  29.5   HCT 39.0 - 52.0 % 35.2  35.5  38.2   Platelets 150 - 400 K/uL 405  400  388   NEUT# 1.7 - 7.7 K/uL 7.5  7.9    Lymph# 0.7 - 4.0 K/uL 0.9  1.2        ASSESSMENT AND PLAN:  57 year old male with history of stenotic ileal Crohn's disease status post ileocecectomy in 2013 with recurrent stenosis based on colonoscopy in 2020 and  CT enterography in 2023.  He has had issues with medication adherence due to insurance coverage, but has been on Humira since November 2023 without interruption, but still developed progression of disease, as manifested with a hospital admission in June of this year. Adalimumab levels taken at that admission were undetectable, and antibodies were intermediate titer.  I do not think  Humira will be a viable option for him going forward.  Although we could consider adding an immunomodulator to salvage Humira, based on his undetectable levels levels and active disease, I think a switch in therapy would be more likely to provide benefit.  I recommended we start Skyrizi.  We discussed the dosing, mechanism of action and potential risks to include infections and drug-induced liver injury. The patient was agreeable to starting Skyrizi.  We will repeat a CRP today and get another fecal calprotectin.  If these are significantly elevated, I think he would benefit from another brief course of steroids while we are awaiting to start [facility]. We also discussed recommended vaccines to include Shingrix, influenza and pneumococcal vaccines. He was counseled on the importance of smoking cessation. We discussed how his stenosis may be fibrostenotic, and may not completely respond to a change in therapy, and therefore would require another surgery.  I am hopeful that we will see some improvement in his stenosis with the [facility].  Crohn's disease, small intestine with complication (obstruction) - Start Skyrizi, standard dosing (600 mg IV on week 0, 4 and 8, followed by 360 mg subcutaneous every 8 weeks, first dose 4 weeks after third infusion) - Fecal calprotectin and CRP today, plan for further steroids if elevated to cover until [employer] induction - Shingrix, influenza and pneumococcal vaccines recommended -Counseled on smoking cessation - Stop Humira once [employer] E. Tomasa Rand, MD [facility]  I spent a total of 40 minutes reviewing the patient's medical record, interviewing and examining the patient, discussing his diagnosis and management of her condition going forward, and documenting in the medical record   Massie Maroon, FNP

## 2023-08-11 NOTE — Progress Notes (Signed)
CRP level normal which is surprising given elevation in June, with very short course of steroids since then.  Will await results of fecal calprotectin.

## 2023-08-12 ENCOUNTER — Other Ambulatory Visit: Payer: Self-pay

## 2023-08-12 ENCOUNTER — Encounter: Payer: Self-pay | Admitting: Gastroenterology

## 2023-08-12 ENCOUNTER — Other Ambulatory Visit: Payer: 59

## 2023-08-12 ENCOUNTER — Telehealth: Payer: Self-pay

## 2023-08-12 DIAGNOSIS — K50012 Crohn's disease of small intestine with intestinal obstruction: Secondary | ICD-10-CM

## 2023-08-12 MED ORDER — SKYRIZI 360 MG/2.4ML ~~LOC~~ SOCT: 360.0000 mg | SUBCUTANEOUS | 6 refills | Status: DC

## 2023-08-12 NOTE — Telephone Encounter (Signed)
Hello,  Patient will be scheduled as soon as possible  Auth Submission: APPROVED Site of care: Site of care: [facility] Payer: [facility] medicare Medication & CPT/J Code(s) submitted: Skyrizi [place]) [phone] Route of submission (phone, fax, portal): portal Phone # Fax # Auth type: Buy/Bill PB Units/visits requested: 600mg , q4weeks, 3doses Reference number: [id number] Approval from: 08/12/23 to 10/12/23

## 2023-08-12 NOTE — Telephone Encounter (Signed)
Monchell, Not sure if this is an office you assist, if so can you start the pharmacy benefit auth for [employer].  Thanks Kim.  Co-pay card: pending - [facility] is aware

## 2023-08-12 NOTE — Telephone Encounter (Signed)
Orders entered for Skyrizi infusions. Order also entered for [place] injection. These need PA's.

## 2023-08-12 NOTE — Telephone Encounter (Signed)
Thanks

## 2023-08-12 NOTE — Telephone Encounter (Signed)
-----   Message from [facility] O sent at 08/11/2023  1:56 PM EDT ----- This patient needs to be started on Skyrizi 600 mg IV times 1 in week 0,4, and 8. Then 360 mg 5CQ 8 weeks for 4 weeks after 3rd infusion. For Crohns disease -small intestine with obstruction.

## 2023-08-12 NOTE — Addendum Note (Signed)
Addended by: Chrystie Nose on: 08/12/2023 09:36 AM   Modules accepted: Orders

## 2023-08-15 ENCOUNTER — Encounter (INDEPENDENT_AMBULATORY_CARE_PROVIDER_SITE_OTHER): Payer: Self-pay | Admitting: Ophthalmology

## 2023-08-15 ENCOUNTER — Ambulatory Visit (INDEPENDENT_AMBULATORY_CARE_PROVIDER_SITE_OTHER): Payer: 59 | Admitting: Ophthalmology

## 2023-08-15 DIAGNOSIS — I10 Essential (primary) hypertension: Secondary | ICD-10-CM | POA: Diagnosis not present

## 2023-08-15 DIAGNOSIS — E113213 Type 2 diabetes mellitus with mild nonproliferative diabetic retinopathy with macular edema, bilateral: Secondary | ICD-10-CM

## 2023-08-15 DIAGNOSIS — Z7984 Long term (current) use of oral hypoglycemic drugs: Secondary | ICD-10-CM | POA: Diagnosis not present

## 2023-08-15 DIAGNOSIS — H25813 Combined forms of age-related cataract, bilateral: Secondary | ICD-10-CM

## 2023-08-15 DIAGNOSIS — H35033 Hypertensive retinopathy, bilateral: Secondary | ICD-10-CM

## 2023-08-15 DIAGNOSIS — Z7985 Long-term (current) use of injectable non-insulin antidiabetic drugs: Secondary | ICD-10-CM | POA: Diagnosis not present

## 2023-08-15 LAB — HM DIABETES EYE EXAM

## 2023-08-15 LAB — CALPROTECTIN, FECAL: Calprotectin, Fecal: 463 ug/g — ABNORMAL HIGH (ref 0–120)

## 2023-08-17 ENCOUNTER — Encounter (INDEPENDENT_AMBULATORY_CARE_PROVIDER_SITE_OTHER): Payer: Self-pay | Admitting: Ophthalmology

## 2023-08-23 ENCOUNTER — Telehealth: Payer: Self-pay

## 2023-08-23 NOTE — Telephone Encounter (Signed)
Patients first Jared [name] is scheduled at [facility] 08/29/23. Just wanted to check on the status of the PA for [facility] Skyrizi.

## 2023-08-25 NOTE — Progress Notes (Signed)
Mr. Counterman,  Your fecal calprotectin was elevated, but not significantly so.  As you are scheduled to start Skyrizi soon and your symptoms were not significantly different on vs off the steroids, I would recommend we hold off on another course of steroids at this time.

## 2023-08-29 ENCOUNTER — Ambulatory Visit: Payer: 59

## 2023-08-29 VITALS — BP 139/69 | HR 78 | Temp 97.7°F | Resp 20 | Ht 74.0 in | Wt 207.0 lb

## 2023-08-29 DIAGNOSIS — K50912 Crohn's disease, unspecified, with intestinal obstruction: Secondary | ICD-10-CM

## 2023-08-29 MED ORDER — SODIUM CHLORIDE 0.9 % IV SOLN
600.0000 mg | Freq: Once | INTRAVENOUS | Status: AC
  Administered 2023-08-29: 600 mg via INTRAVENOUS
  Filled 2023-08-29: qty 10

## 2023-08-29 NOTE — Progress Notes (Signed)
Diagnosis: Crohn's Disease  Provider:  Chilton Greathouse MD  Procedure: IV Infusion  IV Type: Peripheral, IV Location: L Antecubital  Skyrizi (risankizumab-rzaa), Dose: 600 mg  Infusion Start Time: 1025  Infusion Stop Time: 1129  Post Infusion IV Care: Observation period completed and Peripheral IV Discontinued  Discharge: Condition: Stable, Destination: Home . AVS Provided  Performed by:  Wyvonne Lenz, RN

## 2023-08-31 ENCOUNTER — Other Ambulatory Visit (HOSPITAL_COMMUNITY): Payer: Self-pay

## 2023-08-31 ENCOUNTER — Telehealth: Payer: Self-pay | Admitting: Pharmacy Technician

## 2023-08-31 NOTE — Telephone Encounter (Signed)
Pharmacy Patient Advocate Encounter  Received notification from [facility] that Prior Authorization for SKYRIZI 360MG  has been APPROVED from 10.16.24 to 4.16.25. Ran test claim, Copay is $0. This test claim was processed through [facility]- copay amounts may vary at other pharmacies due to pharmacy/plan contracts, or as the patient moves through the different stages of their insurance plan.   PA #/Case ID/Reference #: [id number]

## 2023-08-31 NOTE — Telephone Encounter (Signed)
Pharmacy Patient Advocate Encounter   Received notification from Physician's Office that prior authorization for SKYRIZI 360MG  is required/requested.   Insurance verification completed.   The patient is insured through [facility] .   Per test claim: PA required; PA submitted to [facility] via CoverMyMeds Key/confirmation #/EOC [facility] Status is pending

## 2023-09-09 ENCOUNTER — Other Ambulatory Visit: Payer: Self-pay

## 2023-09-20 ENCOUNTER — Ambulatory Visit (INDEPENDENT_AMBULATORY_CARE_PROVIDER_SITE_OTHER): Payer: 59 | Admitting: Family Medicine

## 2023-09-20 ENCOUNTER — Encounter: Payer: Self-pay | Admitting: Family Medicine

## 2023-09-20 VITALS — BP 137/75 | HR 75 | Temp 97.0°F | Wt 208.0 lb

## 2023-09-20 DIAGNOSIS — M25562 Pain in left knee: Secondary | ICD-10-CM

## 2023-09-20 DIAGNOSIS — I10 Essential (primary) hypertension: Secondary | ICD-10-CM

## 2023-09-20 DIAGNOSIS — G8929 Other chronic pain: Secondary | ICD-10-CM

## 2023-09-20 DIAGNOSIS — E119 Type 2 diabetes mellitus without complications: Secondary | ICD-10-CM | POA: Diagnosis not present

## 2023-09-20 LAB — POCT GLYCOSYLATED HEMOGLOBIN (HGB A1C): Hemoglobin A1C: 7.5 % — AB (ref 4.0–5.6)
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
## 2023-09-21 LAB — CMP AND LIVER
ALT: 16 [IU]/L (ref 0–44)
AST: 14 [IU]/L (ref 0–40)
Albumin: 3.9 g/dL (ref 3.8–4.9)
Alkaline Phosphatase: 78 [IU]/L (ref 44–121)
BUN: 10 mg/dL (ref 6–24)
Bilirubin Total: 0.4 mg/dL (ref 0.0–1.2)
Bilirubin, Direct: 0.15 mg/dL (ref 0.00–0.40)
CO2: 23 mmol/L (ref 20–29)
Calcium: 8.9 mg/dL (ref 8.7–10.2)
Chloride: 106 mmol/L (ref 96–106)
Creatinine, Ser: 0.96 mg/dL (ref 0.76–1.27)
Glucose: 113 mg/dL — ABNORMAL HIGH (ref 70–99)
Potassium: 4.1 mmol/L (ref 3.5–5.2)
Sodium: 144 mmol/L (ref 134–144)
Total Protein: 6.7 g/dL (ref 6.0–8.5)
eGFR: 92 mL/min/{1.73_m2} (ref >59)

## 2023-09-21 LAB — MICROALBUMIN / CREATININE URINE RATIO
Creatinine, Urine: 109.3 mg/dL
Microalb/Creat Ratio: 6 mg/g{creat} (ref 0–29)
Microalbumin, Urine: 6.5 ug/mL

## 2023-09-26 ENCOUNTER — Ambulatory Visit: Payer: 59

## 2023-09-26 VITALS — BP 130/64 | HR 72 | Temp 97.7°F | Resp 18 | Ht 74.0 in | Wt 210.2 lb

## 2023-09-26 DIAGNOSIS — K50912 Crohn's disease, unspecified, with intestinal obstruction: Secondary | ICD-10-CM

## 2023-09-26 MED ORDER — SODIUM CHLORIDE 0.9 % IV SOLN
600.0000 mg | Freq: Once | INTRAVENOUS | Status: AC
  Administered 2023-09-26: 600 mg via INTRAVENOUS
  Filled 2023-09-26: qty 10

## 2023-09-26 NOTE — Progress Notes (Signed)
Diagnosis: Crohn's Disease  Provider:  Chilton Greathouse MD  Procedure: IV Infusion  IV Type: Peripheral, IV Location: L Hand  Skyrizi (risankizumab-rzaa), Dose: 600 mg  Infusion Start Time: 1104  Infusion Stop Time: 1209  Post Infusion IV Care: Patient declined observation   PIV D/Ced   Discharge: Condition: Good, Destination: Home . AVS Declined  Performed by:  Rico Ala, LPN

## 2023-09-27 ENCOUNTER — Ambulatory Visit: Payer: Self-pay | Admitting: Family Medicine

## 2023-09-30 ENCOUNTER — Other Ambulatory Visit: Payer: Self-pay | Admitting: Pharmacy Technician

## 2023-10-02 ENCOUNTER — Emergency Department (HOSPITAL_COMMUNITY)
Admission: EM | Admit: 2023-10-02 | Discharge: 2023-10-02 | Disposition: A | Discharge status: Home / Self Care | Payer: 59 | Attending: Emergency Medicine | Admitting: Emergency Medicine

## 2023-10-02 ENCOUNTER — Encounter (HOSPITAL_COMMUNITY): Payer: Self-pay | Admitting: *Deleted

## 2023-10-02 ENCOUNTER — Other Ambulatory Visit: Payer: Self-pay

## 2023-10-02 DIAGNOSIS — Z7984 Long term (current) use of oral hypoglycemic drugs: Secondary | ICD-10-CM | POA: Diagnosis not present

## 2023-10-02 DIAGNOSIS — E119 Type 2 diabetes mellitus without complications: Secondary | ICD-10-CM | POA: Insufficient documentation

## 2023-10-02 DIAGNOSIS — R21 Rash and other nonspecific skin eruption: Secondary | ICD-10-CM | POA: Diagnosis present

## 2023-10-02 DIAGNOSIS — L259 Unspecified contact dermatitis, unspecified cause: Secondary | ICD-10-CM | POA: Insufficient documentation

## 2023-10-02 DIAGNOSIS — Z79899 Other long term (current) drug therapy: Secondary | ICD-10-CM | POA: Insufficient documentation

## 2023-10-02 DIAGNOSIS — F419 Anxiety disorder, unspecified: Secondary | ICD-10-CM

## 2023-10-02 DIAGNOSIS — K5090 Crohn's disease, unspecified, without complications: Secondary | ICD-10-CM | POA: Diagnosis not present

## 2023-10-02 MED ORDER — DEXAMETHASONE SODIUM PHOSPHATE 10 MG/ML IJ SOLN
10.0000 mg | Freq: Once | INTRAMUSCULAR | Status: AC
  Administered 2023-10-02: 10 mg via INTRAVENOUS
  Filled 2023-10-02: qty 1

## 2023-10-02 MED ORDER — PREDNISONE 10 MG PO TABS
ORAL_TABLET | ORAL | 0 refills | Status: AC
  Filled 2023-10-02: qty 42, 12d supply, fill #0

## 2023-10-02 MED ORDER — DIPHENHYDRAMINE HCL 25 MG PO CAPS
25.0000 mg | ORAL_CAPSULE | Freq: Four times a day (QID) | ORAL | 0 refills | Status: DC | PRN
  Filled 2023-10-02 – 2023-12-19 (×2): qty 20, 5d supply, fill #0

## 2023-10-02 NOTE — Discharge Instructions (Addendum)
We suspect that you likely had contact dermatitis.  Please start taking the medication that is prescribed.  If you are also taking Atarax/hydroxyzine, consider pausing it while you are taking Benadryl.  See your primary care doctor in 1 week.  Return to the ER if your symptoms are getting worse.

## 2023-10-02 NOTE — ED Provider Notes (Signed)
[place] EMERGENCY DEPARTMENT AT [facility] Provider Note   CSN: [id number] Arrival date & time: 10/02/23  2002     History  Chief Complaint  Patient presents with   Rash    Jared [name] is a 57 y.o. male.  HPI    57 year old male comes in with chief complaint of rash and itching. Patient has history of Crohn's disease for which she is on Humira, diabetes.  Patient states that 7 days ago, he had picked up his dog from outside.  He then went to [place] and returned home.  After that 4-hour trip, he started having itching all over his body.  Initially the itching was limited to his extremities, but over time it spread over his torso as well.  He is also noted whelps.  There is not been any drainage.  There is no associated pain.  No history of similar symptoms in the past.  Patient has persistent itching and he has been applying topical steroids which does transiently help.  Symptoms have not resolved, therefore he decided to come to the ER.  Review of system is negative for any nausea, vomiting, fevers, chills.  Home Medications Prior to Admission medications   Medication Sig Start Date End Date Taking? Authorizing Provider  diphenhydrAMINE (BENADRYL) 25 mg capsule Take 1 capsule (25 mg total) by mouth every 6 (six) hours as needed for itching. 10/02/23  Yes Quran Vasco, MD  predniSONE (STERAPRED UNI-PAK 21 TAB) 10 MG (21) TBPK tablet Take by mouth daily. Take 6 tabs by mouth daily  for 2 days, then 5 tabs for 2 days, then 4 tabs for 2 days, then 3 tabs for 2 days, 2 tabs for 2 days, then 1 tab by mouth daily for 2 days 10/02/23  Yes Cyndal Kasson, MD  adalimumab (HUMIRA, 2 PEN,) 40 MG/0.4ML pen Inject 0.4 mLs (40 mg total) into the skin every 14 (fourteen) days. Patient not taking: Reported on 08/15/2023 06/24/23   Beverley Fiedler, MD  atorvastatin (LIPITOR) 10 MG tablet TAKE 1 TABLET BY MOUTH DAILY 04/06/23   Massie Maroon, FNP  empagliflozin (JARDIANCE) 10 MG  TABS tablet Take 1 tablet (10 mg total) by mouth daily before breakfast. 04/25/23   Paseda, Baird Kay, FNP  hydrOXYzine (ATARAX) 10 MG tablet Take 1 tablet (10 mg total) by mouth 3 (three) times daily as needed. 05/02/23   Massie Maroon, FNP  lidocaine (LIDODERM) 5 % Place 1 patch onto the skin daily. Remove & Discard patch within 12 hours or as directed by MD 04/25/23   Donell Beers, FNP  linagliptin (TRADJENTA) 5 MG TABS tablet TAKE 1 TABLET BY MOUTH DAILY 02/18/23   Massie Maroon, FNP  lisinopril (ZESTRIL) 20 MG tablet TAKE 1 TABLET BY MOUTH DAILY 04/06/23   Massie Maroon, FNP  metFORMIN (GLUCOPHAGE) 1000 MG tablet TAKE ONE (1) TABLET BY MOUTH TWICE DAILY WITH A MEAL 04/06/23   Massie Maroon, FNP  omega-3 acid ethyl esters (LOVAZA) 1 g capsule Take 2 g by mouth 2 (two) times daily. 01/15/21   [provider]  Risankizumab-rzaa (SKYRIZI) 360 MG/2.4ML SOCT Inject 360 mg into the skin every 8 (eight) weeks. 08/12/23   Jenel Lucks, MD  furosemide (LASIX) 10 MG/ML solution Take by mouth daily.  01/20/12  [provider]  mesalamine (PENTASA) 250 MG CR capsule Take 1,000 mg by mouth 4 (four) times daily.  01/20/12  [provider]  ONGLYZA 5 MG TABS tablet  TAKE 1 TABLET (5 MG TOTAL) BY MOUTH DAILY. Patient not taking: Reported on 06/03/2020 06/02/20 06/03/20  Massie Maroon, FNP  pioglitazone (ACTOS) 15 MG tablet Take by mouth daily.  01/20/12  [provider]      Allergies    Trazodone and nefazodone    Review of Systems   Review of Systems  All other systems reviewed and are negative.   Physical Exam Updated Vital Signs BP (!) 183/93 (BP Location: Right Arm)   Pulse 93   Temp 98.1 F (36.7 C) (Oral)   Resp 18   SpO2 100%  Physical Exam Vitals and nursing note reviewed.  Constitutional:      Appearance: He is well-developed.  HENT:     Head: Atraumatic.  Cardiovascular:     Rate and Rhythm: Normal rate.  Pulmonary:      Effort: Pulmonary effort is normal.  Musculoskeletal:     Cervical back: Neck supple.  Skin:    General: Skin is warm.     Comments: Urticarial wheals noted over the upper extremity.  Patient has some macules, but mostly papules over the torso and neck as well. No defined lesions noted over upper or lower extremity (patient indicates that they have improved after he applied steroid ointment). No vesicles, pustules  Neurological:     Mental Status: He is alert and oriented to person, place, and time.          ED Results / Procedures / Treatments   Labs (all labs ordered are listed, but only abnormal results are displayed) Labs Reviewed - No data to display  EKG None  Radiology No results found.  Procedures Procedures    Medications Ordered in ED Medications  dexamethasone (DECADRON) injection 10 mg (10 mg Intravenous Given 10/02/23 2114)    ED Course/ Medical Decision Making/ A&P                                 Medical Decision Making Risk Prescription drug management.   57 year old male with history of Crohn's disease on Humira, diabetes comes in with chief complaint of generalized rash.  Rash has been now present for 1 week, it has spread and patient indicates that the lesions are itchy.  He has been applying topical over-the-counter hydrocortisone ointment which gives him transient relief.  Patient is nontoxic-appearing.  He has no fevers, chills, nausea or vomiting.  Given there is immunosuppressed, we did consider disseminated zoster in the differential along with contact dermatitis.  Given the nature of the symptoms, waxing and waning component to the rash, higher suspicion for this being contact dermatitis.    Plan is to give him a shot of dexamethasone right now.  Will put him on Medrol Dosepak and start him on Benadryl.  Advised PCP follow-up in 1 week and return precautions to the ER if his symptoms get worse.  Lack of systemic symptoms and lack of any  vesicles, pustules also reassuring.  Final Clinical Impression(s) / ED Diagnoses Final diagnoses:  Contact dermatitis, unspecified contact dermatitis type, unspecified trigger    Rx / DC Orders ED Discharge Orders          Ordered    diphenhydrAMINE (BENADRYL) 25 mg capsule  Every 6 hours PRN        10/02/23 2124    predniSONE (STERAPRED UNI-PAK 21 TAB) 10 MG (21) TBPK tablet  Daily  10/02/23 2124              Derwood Kaplan, MD 10/02/23 2132

## 2023-10-02 NOTE — ED Triage Notes (Signed)
Pt reporting rash all over his body since last week. He has been using cortisone cream, gold bond and benadryl for the itching.

## 2023-10-03 ENCOUNTER — Other Ambulatory Visit: Payer: Self-pay

## 2023-10-03 ENCOUNTER — Telehealth: Payer: Self-pay | Admitting: Pharmacy Technician

## 2023-10-03 NOTE — Telephone Encounter (Signed)
Auth extension for x1 dose.  Auth expired prior to patient 3rd (final dose)  Auth Submission: APPROVED Site of care: Site of care: [facility] Payer: [employer] Medication & CPT/J Code(s) submitted: Skyrizi Merlyn Albert) [id number] Route of submission (phone, fax, portal): PORTAL Phone # Fax # Auth type: Buy/Bill PB Units/visits requested: X1 DOSE Reference number: [id number] Approval from: 10/23/23 to 10/29/23

## 2023-10-04 ENCOUNTER — Ambulatory Visit: Payer: 59 | Admitting: Orthopaedic Surgery

## 2023-10-06 ENCOUNTER — Encounter (HOSPITAL_COMMUNITY): Payer: Self-pay | Admitting: *Deleted

## 2023-10-06 ENCOUNTER — Other Ambulatory Visit: Payer: Self-pay

## 2023-10-06 ENCOUNTER — Emergency Department (HOSPITAL_COMMUNITY)
Admission: EM | Admit: 2023-10-06 | Discharge: 2023-10-06 | Disposition: A | Discharge status: Home / Self Care | Payer: 59 | Attending: Emergency Medicine | Admitting: Emergency Medicine

## 2023-10-06 DIAGNOSIS — Z7984 Long term (current) use of oral hypoglycemic drugs: Secondary | ICD-10-CM | POA: Diagnosis not present

## 2023-10-06 DIAGNOSIS — I10 Essential (primary) hypertension: Secondary | ICD-10-CM | POA: Diagnosis not present

## 2023-10-06 DIAGNOSIS — Z79899 Other long term (current) drug therapy: Secondary | ICD-10-CM | POA: Insufficient documentation

## 2023-10-06 DIAGNOSIS — E119 Type 2 diabetes mellitus without complications: Secondary | ICD-10-CM | POA: Insufficient documentation

## 2023-10-06 DIAGNOSIS — M25511 Pain in right shoulder: Secondary | ICD-10-CM | POA: Diagnosis not present

## 2023-10-06 DIAGNOSIS — M25512 Pain in left shoulder: Secondary | ICD-10-CM | POA: Insufficient documentation

## 2023-10-06 MED ORDER — NAPROXEN 500 MG PO TABS
500.0000 mg | ORAL_TABLET | Freq: Two times a day (BID) | ORAL | 0 refills | Status: DC
  Filled 2023-10-06: qty 30, 15d supply, fill #0

## 2023-10-06 MED ORDER — NAPROXEN 500 MG PO TABS: 500.0000 mg | ORAL_TABLET | Freq: Two times a day (BID) | ORAL | 0 refills | Status: DC

## 2023-10-06 NOTE — Discharge Instructions (Signed)
I suspect that your pain is related to problems in your neck rather than your shoulders.    Please apply ice to the sore areas.  Ice should be applied for 30 minutes at a time, 4 times a day.  You may take acetaminophen in addition to naproxen.  When you combine acetaminophen and naproxen, you get better pain relief and you get from taking either medication by itself.  Please follow-up with your orthopedic physician.  He may recommend physical therapy in addition to the medication.
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
## 2023-10-06 NOTE — ED Triage Notes (Signed)
Pt ambulatory to triage with c/o bilateral shoulder pain d/t bone spurs. No specific injury

## 2023-10-18 ENCOUNTER — Other Ambulatory Visit: Payer: Self-pay

## 2023-10-18 ENCOUNTER — Other Ambulatory Visit (INDEPENDENT_AMBULATORY_CARE_PROVIDER_SITE_OTHER): Payer: 59

## 2023-10-18 ENCOUNTER — Encounter: Payer: Self-pay | Admitting: Orthopaedic Surgery

## 2023-10-18 ENCOUNTER — Ambulatory Visit (INDEPENDENT_AMBULATORY_CARE_PROVIDER_SITE_OTHER): Payer: 59 | Admitting: Orthopaedic Surgery

## 2023-10-18 DIAGNOSIS — M25562 Pain in left knee: Secondary | ICD-10-CM

## 2023-10-18 DIAGNOSIS — G8929 Other chronic pain: Secondary | ICD-10-CM

## 2023-10-18 MED ORDER — BUPIVACAINE HCL 0.5 % IJ SOLN
2.0000 mL | INTRAMUSCULAR | Status: AC | PRN
  Administered 2023-10-18: 2 mL via INTRA_ARTICULAR

## 2023-10-18 MED ORDER — MELOXICAM 7.5 MG PO TABS
7.5000 mg | ORAL_TABLET | Freq: Two times a day (BID) | ORAL | 2 refills | Status: DC | PRN
  Filled 2023-10-18: qty 30, 15d supply, fill #0
  Filled 2023-12-19: qty 30, 15d supply, fill #1
  Filled 2024-03-16: qty 30, 15d supply, fill #2

## 2023-10-18 MED ORDER — METHYLPREDNISOLONE ACETATE 40 MG/ML IJ SUSP
40.0000 mg | INTRAMUSCULAR | Status: AC | PRN
  Administered 2023-10-18: 40 mg via INTRA_ARTICULAR

## 2023-10-18 MED ORDER — LIDOCAINE HCL 1 % IJ SOLN
2.0000 mL | INTRAMUSCULAR | Status: AC | PRN
  Administered 2023-10-18: 2 mL
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
## 2023-10-19 ENCOUNTER — Other Ambulatory Visit: Payer: Self-pay

## 2023-10-24 ENCOUNTER — Ambulatory Visit: Payer: 59

## 2023-10-24 VITALS — BP 162/78 | HR 75 | Temp 98.6°F | Resp 18 | Ht 74.0 in | Wt 216.0 lb

## 2023-10-24 DIAGNOSIS — K50912 Crohn's disease, unspecified, with intestinal obstruction: Secondary | ICD-10-CM

## 2023-10-24 MED ORDER — SODIUM CHLORIDE 0.9 % IV SOLN
600.0000 mg | Freq: Once | INTRAVENOUS | Status: AC
  Administered 2023-10-24: 600 mg via INTRAVENOUS
  Filled 2023-10-24: qty 10

## 2023-10-24 NOTE — Progress Notes (Signed)
Diagnosis: Crohn's Disease  Provider:  Chilton Greathouse MD  Procedure: IV Infusion  IV Type: Peripheral, IV Location: L Antecubital  Skyrizi (risankizumab-rzaa), Dose: 600 mg  Infusion Start Time: 0959  Infusion Stop Time: 1112  Post Infusion IV Care: Peripheral IV Discontinued  Discharge: Condition: Good, Destination: Home . AVS Declined  Performed by:  Adriana Mccallum, RN

## 2023-11-04 NOTE — Telephone Encounter (Signed)
 Results abstracted and Care Team updated

## 2023-11-11 ENCOUNTER — Emergency Department (HOSPITAL_COMMUNITY)
Admission: EM | Admit: 2023-11-11 | Discharge: 2023-11-11 | Disposition: A | Discharge status: Home / Self Care | Payer: 59 | Attending: Emergency Medicine | Admitting: Emergency Medicine

## 2023-11-11 ENCOUNTER — Other Ambulatory Visit: Payer: Self-pay

## 2023-11-11 ENCOUNTER — Emergency Department (HOSPITAL_COMMUNITY): Payer: 59

## 2023-11-11 DIAGNOSIS — Z7984 Long term (current) use of oral hypoglycemic drugs: Secondary | ICD-10-CM | POA: Diagnosis not present

## 2023-11-11 DIAGNOSIS — R1030 Lower abdominal pain, unspecified: Secondary | ICD-10-CM | POA: Diagnosis not present

## 2023-11-11 DIAGNOSIS — Z79899 Other long term (current) drug therapy: Secondary | ICD-10-CM | POA: Insufficient documentation

## 2023-11-11 DIAGNOSIS — R42 Dizziness and giddiness: Secondary | ICD-10-CM | POA: Insufficient documentation

## 2023-11-11 DIAGNOSIS — E119 Type 2 diabetes mellitus without complications: Secondary | ICD-10-CM | POA: Diagnosis not present

## 2023-11-11 DIAGNOSIS — I10 Essential (primary) hypertension: Secondary | ICD-10-CM | POA: Insufficient documentation

## 2023-11-11 DIAGNOSIS — H538 Other visual disturbances: Secondary | ICD-10-CM | POA: Insufficient documentation

## 2023-11-11 LAB — COMPREHENSIVE METABOLIC PANEL
ALT: 23 U/L (ref 0–44)
AST: 21 U/L (ref 15–41)
Albumin: 3.6 g/dL (ref 3.5–5.0)
Alkaline Phosphatase: 60 U/L (ref 38–126)
Anion gap: 8 (ref 5–15)
BUN: 14 mg/dL (ref 6–20)
CO2: 22 mmol/L (ref 22–32)
Calcium: 8.9 mg/dL (ref 8.9–10.3)
Chloride: 108 mmol/L (ref 98–111)
Creatinine, Ser: 1.07 mg/dL (ref 0.61–1.24)
GFR, Estimated: >60 mL/min (ref >60)
Glucose, Bld: 205 mg/dL — ABNORMAL HIGH (ref 70–99)
Potassium: 3.9 mmol/L (ref 3.5–5.1)
Sodium: 138 mmol/L (ref 135–145)
Total Bilirubin: 0.5 mg/dL (ref <1.2)
Total Protein: 7.1 g/dL (ref 6.5–8.1)

## 2023-11-11 LAB — CBC
HCT: 38.8 % — ABNORMAL LOW (ref 39.0–52.0)
Hemoglobin: 11.8 g/dL — ABNORMAL LOW (ref 13.0–17.0)
MCH: 27.3 pg (ref 26.0–34.0)
MCHC: 30.4 g/dL (ref 30.0–36.0)
MCV: 89.6 fL (ref 80.0–100.0)
Platelets: 303 10*3/uL (ref 150–400)
RBC: 4.33 MIL/uL (ref 4.22–5.81)
RDW: 15.9 % — ABNORMAL HIGH (ref 11.5–15.5)
WBC: 7.9 10*3/uL (ref 4.0–10.5)
nRBC: 0 % (ref 0.0–0.2)

## 2023-11-11 LAB — DIFFERENTIAL
Abs Immature Granulocytes: 0.03 10*3/uL (ref 0.00–0.07)
Basophils Absolute: 0 10*3/uL (ref 0.0–0.1)
Basophils Relative: 0 %
Eosinophils Absolute: 0.1 10*3/uL (ref 0.0–0.5)
Eosinophils Relative: 1 %
Immature Granulocytes: 0 %
Lymphocytes Relative: 23 %
Lymphs Abs: 1.8 10*3/uL (ref 0.7–4.0)
Monocytes Absolute: 0.9 10*3/uL (ref 0.1–1.0)
Monocytes Relative: 12 %
Neutro Abs: 5 10*3/uL (ref 1.7–7.7)
Neutrophils Relative %: 64 %

## 2023-11-11 LAB — CBG MONITORING, ED: Glucose-Capillary: 203 mg/dL — ABNORMAL HIGH (ref 70–99)

## 2023-11-11 MED ORDER — MECLIZINE HCL 25 MG PO TABS
25.0000 mg | ORAL_TABLET | Freq: Three times a day (TID) | ORAL | 0 refills | Status: AC | PRN
  Filled 2023-11-11: qty 21, 7d supply, fill #0

## 2023-11-11 NOTE — ED Provider Notes (Signed)
[place] EMERGENCY DEPARTMENT AT [facility] Provider Note   CSN: [id number] Arrival date & time: 11/11/23  1751     History  Chief Complaint  Patient presents with   Dizziness   Nausea   Abdominal Pain    Jared [name] is a 57 y.o. male.   Dizziness Abdominal Pain    Patient has a history of Crohn's disease diabetes hypertension.  He presents ED with complaints of dizziness and blurred vision.  Patient states that symptoms started last night.  He is feeling dizzy and lightheaded.  He also feels like his vision was blurry yesterday.  He denies any trouble with falls.  No trouble with his speech.  Patient denies any history of prior stroke.  Patient mentioned to the triage nurse having some mild lower abdominal discomfort.  Patient denies any pain to me  Home Medications Prior to Admission medications   Medication Sig Start Date End Date Taking? Authorizing Provider  meclizine (ANTIVERT) 25 MG tablet Take 1 tablet (25 mg total) by mouth 3 (three) times daily as needed for dizziness. 11/11/23  Yes Linwood Dibbles, MD  adalimumab (HUMIRA, 2 PEN,) 40 MG/0.4ML pen Inject 0.4 mLs (40 mg total) into the skin every 14 (fourteen) days. 06/24/23   Pyrtle, Carie Caddy, MD  atorvastatin (LIPITOR) 10 MG tablet TAKE 1 TABLET BY MOUTH DAILY 04/06/23   Massie Maroon, FNP  diphenhydrAMINE (BENADRYL) 25 mg capsule Take 1 capsule (25 mg total) by mouth every 6 (six) hours as needed for itching. 10/02/23   Derwood Kaplan, MD  empagliflozin (JARDIANCE) 10 MG TABS tablet Take 1 tablet (10 mg total) by mouth daily before breakfast. 04/25/23   Paseda, Baird Kay, FNP  hydrOXYzine (ATARAX) 10 MG tablet Take 1 tablet (10 mg total) by mouth 3 (three) times daily as needed. 05/02/23   Massie Maroon, FNP  lidocaine (LIDODERM) 5 % Place 1 patch onto the skin daily. Remove & Discard patch within 12 hours or as directed by MD 04/25/23   Donell Beers, FNP  linagliptin (TRADJENTA) 5 MG TABS tablet  TAKE 1 TABLET BY MOUTH DAILY 02/18/23   Massie Maroon, FNP  lisinopril (ZESTRIL) 20 MG tablet TAKE 1 TABLET BY MOUTH DAILY 04/06/23   Massie Maroon, FNP  meloxicam (MOBIC) 7.5 MG tablet Take 1 tablet (7.5 mg total) by mouth 2 (two) times daily as needed for pain. 10/18/23   Tarry Kos, MD  metFORMIN (GLUCOPHAGE) 1000 MG tablet TAKE ONE (1) TABLET BY MOUTH TWICE DAILY WITH A MEAL 04/06/23   Massie Maroon, FNP  naproxen (NAPROSYN) 500 MG tablet Take 1 tablet (500 mg total) by mouth 2 (two) times daily. 10/06/23   Dione Booze, MD  omega-3 acid ethyl esters (LOVAZA) 1 g capsule Take 2 g by mouth 2 (two) times daily. 01/15/21   [provider]  Risankizumab-rzaa (SKYRIZI) 360 MG/2.4ML SOCT Inject 360 mg into the skin every 8 (eight) weeks. 08/12/23   Jenel Lucks, MD  furosemide (LASIX) 10 MG/ML solution Take by mouth daily.  01/20/12  [provider]  mesalamine (PENTASA) 250 MG CR capsule Take 1,000 mg by mouth 4 (four) times daily.  01/20/12  [provider]  ONGLYZA 5 MG TABS tablet TAKE 1 TABLET (5 MG TOTAL) BY MOUTH DAILY. Patient not taking: Reported on 06/03/2020 06/02/20 06/03/20  Massie Maroon, FNP  pioglitazone (ACTOS) 15 MG tablet Take by mouth daily.  01/20/12  [provider]  Allergies    Trazodone and nefazodone    Review of Systems   Review of Systems  Gastrointestinal:  Positive for abdominal pain.  Neurological:  Positive for dizziness.    Physical Exam Updated Vital Signs BP 137/82 (BP Location: Left Arm)   Pulse 64   Temp 97.8 F (36.6 C) (Oral)   Resp 16   Ht 1.88 m (6\' 2" )   Wt 98 kg   SpO2 99%   BMI 27.73 kg/m  Physical Exam Vitals and nursing note reviewed.  Constitutional:      General: He is not in acute distress.    Appearance: He is well-developed.  HENT:     Head: Normocephalic and atraumatic.     Right Ear: External ear normal.     Left Ear: External ear normal.  Eyes:     General: No visual field  deficit or scleral icterus.       Right eye: No discharge.        Left eye: No discharge.     Conjunctiva/sclera: Conjunctivae normal.  Neck:     Trachea: No tracheal deviation.  Cardiovascular:     Rate and Rhythm: Normal rate and regular rhythm.  Pulmonary:     Effort: Pulmonary effort is normal. No respiratory distress.     Breath sounds: Normal breath sounds. No stridor. No wheezing or rales.  Abdominal:     General: Bowel sounds are normal. There is no distension.     Palpations: Abdomen is soft.     Tenderness: There is no abdominal tenderness. There is no guarding or rebound.  Musculoskeletal:        General: No tenderness.     Cervical back: Neck supple.  Skin:    General: Skin is warm and dry.     Findings: No rash.  Neurological:     Mental Status: He is alert and oriented to person, place, and time.     Cranial Nerves: No cranial nerve deficit, dysarthria or facial asymmetry.     Sensory: No sensory deficit.     Motor: No abnormal muscle tone, seizure activity or pronator drift.     Coordination: Coordination normal.     Comments:  able to hold both legs off bed for 5 seconds, sensation intact in all extremities,  no left or right sided neglect, normal finger-nose exam bilaterally, no nystagmus noted   Psychiatric:        Mood and Affect: Mood normal.     ED Results / Procedures / Treatments   Labs (all labs ordered are listed, but only abnormal results are displayed) Labs Reviewed  CBC - Abnormal; Notable for the following components:      Result Value   Hemoglobin 11.8 (*)    HCT 38.8 (*)    RDW 15.9 (*)    All other components within normal limits  COMPREHENSIVE METABOLIC PANEL - Abnormal; Notable for the following components:   Glucose, Bld 205 (*)    All other components within normal limits  CBG MONITORING, ED - Abnormal; Notable for the following components:   Glucose-Capillary 203 (*)    All other components within normal limits  DIFFERENTIAL     EKG EKG Interpretation Date/Time:  Friday November 11 2023 17:57:59 EST Ventricular Rate:  79 PR Interval:  145 QRS Duration:  96 QT Interval:  372 QTC Calculation: 427 R Axis:   79  Text Interpretation: Sinus rhythm Atrial premature complexes , new since last tracing Confirmed by Linwood Dibbles (  [id number]) on 11/11/2023 9:27:50 PM  Radiology MR BRAIN WO CONTRAST Result Date: 11/11/2023 CLINICAL DATA:  Dizziness and blurred vision, stroke suspected EXAM: MRI HEAD WITHOUT CONTRAST TECHNIQUE: Multiplanar, multiecho pulse sequences of the brain and surrounding structures were obtained without intravenous contrast. COMPARISON:  05/03/2022 MRI head FINDINGS: Evaluation is somewhat limited by susceptibility artifact related to a be in the patient's right cheek. Head this particularly limits susceptibility and diffusion-weighted imaging. Brain: No restricted diffusion to suggest acute or subacute infarct. No acute hemorrhage, mass, mass effect, or midline shift. No hydrocephalus or extra-axial collection. Pituitary and craniocervical junction within normal limits. No hemosiderin deposition to suggest remote hemorrhage. Normal cerebral volume for age. No evidence of remote cortical or lacunar infarct. Vascular: Normal arterial flow voids. Skull and upper cervical spine: Normal marrow signal. Sinuses/Orbits: Clear paranasal sinuses. No acute finding in the orbits. Other: Trace fluid in the bilateral mastoid air cells. IMPRESSION: Evaluation is somewhat limited by susceptibility artifact. Within this limitation, no acute intracranial process. No evidence of acute or subacute infarct. Electronically Signed   By: Wiliam Ke M.D.   On: 11/11/2023 21:16    Procedures Procedures    Medications Ordered in ED Medications - No data to display  ED Course/ Medical Decision Making/ A&P Clinical Course as of 11/11/23 2146  Fri Nov 11, 2023  2127 MRI does not show any acute abnormality [JK]    Clinical  Course User Index [JK] Linwood Dibbles, MD                                 Medical Decision Making Amount and/or Complexity of Data Reviewed Labs: ordered. Radiology: ordered.   Patient presented to the ED for evaluation of dizziness.  Patient mentions some blurred vision.  Laboratory test did not show any signs of anemia.  No significant electrolyte abnormalities.  Patient mildly hyperglycemic but this would not cause any of his symptoms.  Concerned about the possibility of peripheral vertigo versus a central cause considering his visual symptoms.  MRI was performed and does not show any acute abnormality.  No signs of stroke mass or hemorrhage.  Symptoms may be related to peripheral vertigo.  At this time patient appears stable for discharge and outpatient follow-up        Final Clinical Impression(s) / ED Diagnoses Final diagnoses:  Dizziness    Rx / DC Orders ED Discharge Orders          Ordered    meclizine (ANTIVERT) 25 MG tablet  3 times daily PRN        11/11/23 2143              Linwood Dibbles, MD 11/11/23 2146

## 2023-11-11 NOTE — Discharge Instructions (Signed)
The test today in the ED were reassuring.  The MRI did not show any signs of stroke.  Take the meclizine as needed for dizziness.  Follow-up with your primary care doctor to be rechecked if the symptoms do not resolve

## 2023-11-11 NOTE — ED Triage Notes (Signed)
Pt arrives via POV. Pt reports reports dizziness and blurred vision in both eyes since yesterday around 1000. Pt also reports right lower abdominal pain and nausea. Believes symptoms may be related to his Chron's. Pt is AxOx4. No other focal neurological deficits noted.

## 2023-11-12 NOTE — ED Notes (Signed)
11/12/23 1420 pt called stating they sent his OTC medication to incorrect pharmacy. Pt aware to present paperwork to pharmacy and they can help locate the med.

## 2023-11-14 ENCOUNTER — Other Ambulatory Visit: Payer: Self-pay

## 2023-11-14 ENCOUNTER — Encounter (INDEPENDENT_AMBULATORY_CARE_PROVIDER_SITE_OTHER): Payer: 59 | Admitting: Ophthalmology

## 2023-11-14 DIAGNOSIS — H25813 Combined forms of age-related cataract, bilateral: Secondary | ICD-10-CM

## 2023-11-14 DIAGNOSIS — Z7985 Long-term (current) use of injectable non-insulin antidiabetic drugs: Secondary | ICD-10-CM

## 2023-11-14 DIAGNOSIS — E113213 Type 2 diabetes mellitus with mild nonproliferative diabetic retinopathy with macular edema, bilateral: Secondary | ICD-10-CM

## 2023-11-14 DIAGNOSIS — I10 Essential (primary) hypertension: Secondary | ICD-10-CM

## 2023-11-14 DIAGNOSIS — Z7984 Long term (current) use of oral hypoglycemic drugs: Secondary | ICD-10-CM

## 2023-11-14 DIAGNOSIS — H35033 Hypertensive retinopathy, bilateral: Secondary | ICD-10-CM

## 2023-11-14 NOTE — Progress Notes (Addendum)
 [facility] - Clinic Note  11/22/2023   CHIEF COMPLAINT Patient presents for Retina Follow Up  HISTORY OF PRESENT ILLNESS: Jared [name] is a 57 y.o. male who presents to the clinic today for:  HPI     Retina Follow Up   Patient presents with  Diabetic Retinopathy.  In both eyes.  This started months ago.  Duration of 4 months.  I, the attending physician,  performed the HPI with the patient and updated documentation appropriately.        Comments   Patient states the vision is about the same, every once in a while he sees something off to the right in the right eye. His blood sugar was 135. He is not using eye drops.       Last edited by Valdemar Rogue, MD on 11/22/2023  4:45 PM.       Referring physician: Frazier, Chad, OD [address] [place],  [place] [address]  HISTORICAL INFORMATION:  Selected notes from the MEDICAL RECORD NUMBER Referred by Dr. Vivian for baseline retinal eval -- +DME LEE:  Ocular Hx- PMH-   CURRENT MEDICATIONS: No current outpatient medications on file. (Ophthalmic Drugs)   No current facility-administered medications for this visit. (Ophthalmic Drugs)   Current Outpatient Medications (Other)  Medication Sig   adalimumab  (HUMIRA , 2 PEN,) 40 MG/0.4ML pen Inject 0.4 mLs (40 mg total) into the skin every 14 (fourteen) days.   atorvastatin  (LIPITOR) 10 MG tablet TAKE 1 TABLET BY MOUTH DAILY   diphenhydrAMINE  (BENADRYL ) 25 mg capsule Take 1 capsule (25 mg total) by mouth every 6 (six) hours as needed for itching.   empagliflozin  (JARDIANCE ) 10 MG TABS tablet Take 1 tablet (10 mg total) by mouth daily before breakfast.   hydrOXYzine  (ATARAX ) 10 MG tablet Take 1 tablet (10 mg total) by mouth 3 (three) times daily as needed.   lidocaine  (LIDODERM ) 5 % Place 1 patch onto the skin daily. Remove & Discard patch within 12 hours or as directed by MD   linagliptin  (TRADJENTA ) 5 MG TABS tablet TAKE 1 TABLET BY MOUTH DAILY    lisinopril  (ZESTRIL ) 20 MG tablet TAKE 1 TABLET BY MOUTH DAILY   meclizine  (ANTIVERT ) 25 MG tablet Take 1 tablet (25 mg total) by mouth 3 (three) times daily as needed for dizziness.   meloxicam  (MOBIC ) 7.5 MG tablet Take 1 tablet (7.5 mg total) by mouth 2 (two) times daily as needed for pain.   metFORMIN  (GLUCOPHAGE ) 1000 MG tablet TAKE ONE (1) TABLET BY MOUTH TWICE DAILY WITH A MEAL   naproxen  (NAPROSYN ) 500 MG tablet Take 1 tablet (500 mg total) by mouth 2 (two) times daily.   omega-3 acid ethyl esters (LOVAZA) 1 g capsule Take 2 g by mouth 2 (two) times daily.   Risankizumab -rzaa (SKYRIZI ) 360 MG/2.4ML SOCT Inject 360 mg into the skin every 8 (eight) weeks.   No current facility-administered medications for this visit. (Other)   REVIEW OF SYSTEMS: ROS   Positive for: Gastrointestinal, Endocrine Last edited by Myra Wanda SAILOR, COT on 11/22/2023  9:08 AM.      ALLERGIES Allergies  Allergen Reactions   Trazodone  And Nefazodone     hallucinations   PAST MEDICAL HISTORY Past Medical History:  Diagnosis Date   Crohn disease (HCC) 2008   with hospital admission in 2011, and 8/12 for flare up and SBO relieved with bowel rest. no suregery, no meds for CD   Diabetes mellitus type II  Hypertension    Past Surgical History:  Procedure Laterality Date   BOWEL RESECTION  01/25/2012   Procedure: SMALL BOWEL RESECTION;  Surgeon: Debby LABOR. 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 Cornett, MD;  Location: [facility] OR;  Service: General;  Laterality: N/A;   SCROTAL SURGERY  ~ 1971   took out a pellet   FAMILY HISTORY Family History  Problem Relation Age of Onset   Diabetes Mother    Alopecia Mother    Coronary artery disease Mother    Lung cancer Mother        smoker but had quit   Dementia Father    Diabetes Sister     Hyperlipidemia Sister    Hypertension Sister    Angina Brother    Hepatitis Brother    Alcohol abuse Brother    Diabetes Brother    Colon cancer Paternal Grandmother    Esophageal cancer Neg Hx    Stomach cancer Neg Hx    Rectal cancer Neg Hx    SOCIAL HISTORY Social History   Tobacco Use   Smoking status: Some Days    Current packs/day: 0.25    Average packs/day: 0.3 packs/day for 30.0 years (7.5 ttl pk-yrs)    Types: Cigarettes   Smokeless tobacco: Former    Types: Chew    Quit date: 01/20/1997  Vaping Use   Vaping status: Never Used  Substance Use Topics   Alcohol use: Not Currently   Drug use: No       OPHTHALMIC EXAM:  Base Eye Exam     Visual Acuity (Snellen - Linear)       Right Left   Dist [place] 20/40 20/30   Dist ph [place] 20/25 20/25         Tonometry (Tonopen, 9:14 AM)       Right Left   Pressure 10 14         Pupils       Dark Light Shape React APD   Right 4 3 Round Brisk None   Left 4 3 Round Brisk None         Visual Fields       Left Right    Full Full         Extraocular Movement       Right Left    Full, Ortho Full, Ortho         Neuro/Psych     Oriented x3: Yes   Mood/Affect: Normal         Dilation     Both eyes: 1.0% Mydriacyl, 2.5% Phenylephrine @ 9:09 AM           Slit Lamp and Fundus Exam     Slit Lamp Exam       Right Left   Lids/Lashes Normal Normal   Conjunctiva/Sclera mild melanosis mild melanosis   Cornea trace PEE mild arcus, trace tear film debris, 1+ Punctate epithelial erosions   Anterior Chamber deep, clear, narrow angles deep, clear, narrow angles   Iris Round and dilated, No NVI Round and dilated, No NVI   Lens 2+ Nuclear sclerosis, 2+ Cortical cataract 2+ Nuclear sclerosis, 3+ Cortical cataract   Anterior Vitreous mild syneresis mild syneresis         Fundus Exam       Right  Left   Disc Pink and Sharp Pink and Sharp   C/D Ratio 0.6 0.3   Macula Flat, Good foveal reflex, rare  MA, +cystic changes temporal fovea-- improved Flat, Good foveal reflex, No heme or edema   Vessels attenuated, mild tortuosity attenuated, mild tortuosity, mild AV crossing changes   Periphery Attached, rare MA Attached, punctate CWS IT to disc-- improved, rare MA           IMAGING AND PROCEDURES  Imaging and Procedures for 11/22/2023  OCT, Retina - OU - Both Eyes       Right Eye Quality was good. Central Foveal Thickness: 271. Progression has improved. Findings include normal foveal contour, no IRF, no SRF, intraretinal hyper-reflective material, vitreomacular adhesion (Focal cyst temporal fovea-- improved).   Left Eye Quality was good. Central Foveal Thickness: 267. Progression has been stable. Findings include normal foveal contour, no IRF, no SRF, vitreomacular adhesion .   Notes *Images captured and stored on drive  Diagnosis / Impression:  NFP, no IRF/SRF OU OD: focal cyst temporal fovea-- improved  Clinical management:  See below  Abbreviations: NFP - Normal foveal profile. CME - cystoid macular edema. PED - pigment epithelial detachment. IRF - intraretinal fluid. SRF - subretinal fluid. EZ - ellipsoid zone. ERM - epiretinal membrane. ORA - outer retinal atrophy. ORT - outer retinal tubulation. SRHM - subretinal hyper-reflective material. IRHM - intraretinal hyper-reflective material 272     Fluorescein  Angiography Optos (Transit OD)       Right Eye Progression has no prior data. Early phase findings include microaneurysm. Mid/Late phase findings include leakage, microaneurysm (Scattered MA with late leakage, no NV).   Left Eye Progression has no prior data. Early phase findings include microaneurysm. Mid/Late phase findings include leakage, microaneurysm (Scattered MA with late leakage, no NV).   Notes *Images captured and stored on drive  Diagnosis / Impression:  NPDR OU OU: Scattered MA with late leakage; no NV  Clinical management:  See  below  Abbreviations: NFP - Normal foveal profile. CME - cystoid macular edema. PED - pigment epithelial detachment. IRF - intraretinal fluid. SRF - subretinal fluid. EZ - ellipsoid zone. ERM - epiretinal membrane. ORA - outer retinal atrophy. ORT - outer retinal tubulation. SRHM - subretinal hyper-reflective material. IRHM - intraretinal hyper-reflective material           ASSESSMENT/PLAN:   ICD-10-CM   1. 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 Long term (current) use of oral hypoglycemic drugs  Z79.84     3. Long-term (current) use of injectable non-insulin  antidiabetic drugs  Z79.85     4. Essential hypertension  I10     5. Hypertensive retinopathy of both eyes  H35.033     6. Combined forms of age-related cataract of both eyes  H25.813      1-3. Mild Non-proliferative diabetic retinopathy, both eyes  - A1c: 7.5 (11.05.24), 8.3 in June 2024 - The incidence, risk factors for progression, natural history and treatment options for diabetic retinopathy were discussed with patient.   - The need for close monitoring of blood glucose, blood pressure, and serum lipids, avoiding cigarette or any type of tobacco, and the need for long term follow up was also discussed with patient. - exam shows rare MA, +cystic changes temporal fovea OD-- improved - FA (01.07.25)  shows NL:Drjuuzmzi MA with late leakage; no NV OU - OCT shows focal cyst temporal fovea / diabetic macular edema, right eye -- improved - f/u in 9-12 months -- DFE/OCT  4,5. Hypertensive retinopathy OU - discussed importance of tight BP control - monitor  6. Mixed Cataract OU - The symptoms of cataract, surgical options, and treatments and risks were discussed with patient. - discussed diagnosis and progression - monitor  Ophthalmic Meds  Ordered this visit:  No orders of the defined types were placed in this encounter.    Return for f/u 9-12 months NPDR OU , DFE, OCT.  There are no Patient Instructions on file for this visit.   This document serves as a record of services personally performed by Redell JUDITHANN Hans, MD, PhD. It was created on their behalf by Wanda GEANNIE Keens, COT an ophthalmic technician. The creation of this record is the provider's dictation and/or activities during the visit.    Electronically signed by:  Wanda GEANNIE Keens, COT  11/26/23 1:21 AM  Redell JUDITHANN Hans, M.D., Ph.D. Diseases & Surgery of the Retina and Vitreous [facility]  I have reviewed the above documentation for accuracy and completeness, and I agree with the above. Redell JUDITHANN Hans, M.D., Ph.D. 11/26/23 1:21 AM    Abbreviations: M myopia (nearsighted); A astigmatism; H hyperopia (farsighted); P presbyopia; Mrx spectacle prescription;  CTL contact lenses; OD right eye; OS left eye; OU both eyes  XT exotropia; ET esotropia; PEK punctate epithelial keratitis; PEE punctate epithelial erosions; DES dry eye syndrome; MGD meibomian gland dysfunction; ATs artificial tears; PFAT's preservative free artificial tears; NSC nuclear sclerotic cataract; PSC posterior subcapsular cataract; ERM epi-retinal membrane; PVD posterior vitreous detachment; RD retinal detachment; DM diabetes mellitus; DR diabetic retinopathy; NPDR non-proliferative diabetic retinopathy; PDR proliferative diabetic retinopathy; CSME clinically significant macular edema; DME diabetic macular edema; dbh dot blot hemorrhages; CWS cotton wool spot; POAG primary open angle glaucoma; C/D cup-to-disc ratio; HVF humphrey visual field; GVF goldmann visual field; OCT optical coherence tomography; IOP intraocular pressure; BRVO Branch retinal vein occlusion; CRVO central retinal vein occlusion; CRAO central retinal artery occlusion; BRAO branch retinal artery occlusion; RT  retinal tear; SB scleral buckle; PPV pars plana vitrectomy; VH Vitreous hemorrhage; PRP panretinal laser photocoagulation; IVK intravitreal kenalog ; VMT vitreomacular traction; MH Macular hole;  NVD neovascularization of the disc; NVE neovascularization elsewhere; AREDS age related eye disease study; ARMD age related macular degeneration; POAG primary open angle glaucoma; EBMD epithelial/anterior basement membrane dystrophy; ACIOL anterior chamber intraocular lens; IOL intraocular lens; PCIOL posterior chamber intraocular lens; Phaco/IOL phacoemulsification with intraocular lens placement; PRK photorefractive keratectomy; LASIK laser assisted in situ keratomileusis; HTN hypertension; DM diabetes mellitus; COPD chronic obstructive pulmonary disease

## 2023-11-22 ENCOUNTER — Encounter (INDEPENDENT_AMBULATORY_CARE_PROVIDER_SITE_OTHER): Payer: Self-pay | Admitting: Ophthalmology

## 2023-11-22 ENCOUNTER — Ambulatory Visit (INDEPENDENT_AMBULATORY_CARE_PROVIDER_SITE_OTHER): Payer: 59 | Admitting: Ophthalmology

## 2023-11-22 VITALS — BP 148/74 | HR 73

## 2023-11-22 DIAGNOSIS — H25813 Combined forms of age-related cataract, bilateral: Secondary | ICD-10-CM

## 2023-11-22 DIAGNOSIS — Z7984 Long term (current) use of oral hypoglycemic drugs: Secondary | ICD-10-CM | POA: Diagnosis not present

## 2023-11-22 DIAGNOSIS — I10 Essential (primary) hypertension: Secondary | ICD-10-CM | POA: Diagnosis not present

## 2023-11-22 DIAGNOSIS — E113213 Type 2 diabetes mellitus with mild nonproliferative diabetic retinopathy with macular edema, bilateral: Secondary | ICD-10-CM | POA: Diagnosis not present

## 2023-11-22 DIAGNOSIS — Z7985 Long-term (current) use of injectable non-insulin antidiabetic drugs: Secondary | ICD-10-CM | POA: Diagnosis not present

## 2023-11-22 DIAGNOSIS — H35033 Hypertensive retinopathy, bilateral: Secondary | ICD-10-CM

## 2023-12-19 ENCOUNTER — Other Ambulatory Visit: Payer: Self-pay

## 2023-12-19 ENCOUNTER — Other Ambulatory Visit: Payer: Self-pay | Admitting: Emergency Medicine

## 2023-12-19 DIAGNOSIS — M19011 Primary osteoarthritis, right shoulder: Secondary | ICD-10-CM

## 2023-12-19 MED ORDER — LIDOCAINE 5 % EX PTCH: 1.0000 | MEDICATED_PATCH | CUTANEOUS | 0 refills | Status: DC

## 2023-12-19 NOTE — Telephone Encounter (Signed)
Please advise. Pt has upcoming appt with you.  Renelda Loma RMA

## 2023-12-20 ENCOUNTER — Other Ambulatory Visit: Payer: Self-pay | Admitting: Nurse Practitioner

## 2023-12-20 ENCOUNTER — Ambulatory Visit (INDEPENDENT_AMBULATORY_CARE_PROVIDER_SITE_OTHER): Payer: 59 | Admitting: Nurse Practitioner

## 2023-12-20 ENCOUNTER — Other Ambulatory Visit: Payer: Self-pay

## 2023-12-20 ENCOUNTER — Encounter: Payer: Self-pay | Admitting: Nurse Practitioner

## 2023-12-20 VITALS — BP 129/63 | HR 75 | Temp 97.4°F | Wt 215.6 lb

## 2023-12-20 DIAGNOSIS — E1140 Type 2 diabetes mellitus with diabetic neuropathy, unspecified: Secondary | ICD-10-CM

## 2023-12-20 DIAGNOSIS — E119 Type 2 diabetes mellitus without complications: Secondary | ICD-10-CM | POA: Diagnosis not present

## 2023-12-20 LAB — POCT GLYCOSYLATED HEMOGLOBIN (HGB A1C): Hemoglobin A1C: 9.2 % — AB (ref 4.0–5.6)

## 2023-12-20 MED ORDER — EMPAGLIFLOZIN 10 MG PO TABS
10.0000 mg | ORAL_TABLET | Freq: Every day | ORAL | 0 refills | Status: DC
  Filled 2023-12-20: qty 90, 90d supply, fill #0

## 2023-12-20 NOTE — Progress Notes (Signed)
Patient left without been seen

## 2023-12-21 ENCOUNTER — Other Ambulatory Visit: Payer: Self-pay

## 2023-12-26 ENCOUNTER — Telehealth: Payer: Self-pay | Admitting: Gastroenterology

## 2023-12-26 MED ORDER — SKYRIZI 360 MG/2.4ML ~~LOC~~ SOCT: 360.0000 mg | SUBCUTANEOUS | 6 refills | Status: AC

## 2023-12-26 NOTE — Telephone Encounter (Signed)
 Returned call to patient. Pt has not received any of his Skyrizi  maintenance dose prescriptions at this time. I provided patient with the phone number to [facility] so that he can set up shipment of his medication. I advised that RX was sent back in 07/2023. Pt will let us  know if he has any issues.

## 2023-12-26 NOTE — Telephone Encounter (Signed)
 Inbound call from patient, states [facility] did not have his information on file, would like a call to follow up.

## 2023-12-26 NOTE — Telephone Encounter (Signed)
 Inbound call from patient wishing to discuss Skyrizi . States he has not [facility] from [facility]. Please advise, thank you.

## 2023-12-26 NOTE — Addendum Note (Signed)
 Addended by: Denisha Hoel N on: 12/26/2023 10:57 AM   Modules accepted: Orders

## 2023-12-26 NOTE — Telephone Encounter (Addendum)
 Called [facility] [phone]) where prescription was sent to. They do not show an active account for patient, Skyrizi  prescription will need to be sent [facility]. I called [facility] [phone]) and spoke with Moira Andrews. Moira Andrews confirmed an active profile for patient and she did confirm that RX needs to be sent to them. It will take about 72 hours to process script, PA information attached to RX. I called and informed pt of this information. I advised pt to contact [facility] on Thursday AM if he has not heard from them by then, I provided pt with their phone number. Pt verbalized understanding and had no concerns at the end of the  call.

## 2023-12-30 ENCOUNTER — Other Ambulatory Visit: Payer: Self-pay

## 2024-01-03 ENCOUNTER — Other Ambulatory Visit: Payer: Self-pay

## 2024-01-18 ENCOUNTER — Other Ambulatory Visit: Payer: Self-pay

## 2024-01-18 ENCOUNTER — Ambulatory Visit (INDEPENDENT_AMBULATORY_CARE_PROVIDER_SITE_OTHER): Payer: Self-pay | Admitting: Nurse Practitioner

## 2024-01-18 ENCOUNTER — Other Ambulatory Visit: Payer: Self-pay | Admitting: Nurse Practitioner

## 2024-01-18 ENCOUNTER — Encounter: Payer: Self-pay | Admitting: Nurse Practitioner

## 2024-01-18 VITALS — BP 135/63 | HR 84 | Temp 97.3°F | Wt 215.0 lb

## 2024-01-18 DIAGNOSIS — E119 Type 2 diabetes mellitus without complications: Secondary | ICD-10-CM | POA: Diagnosis not present

## 2024-01-18 DIAGNOSIS — Z720 Tobacco use: Secondary | ICD-10-CM

## 2024-01-18 DIAGNOSIS — K5900 Constipation, unspecified: Secondary | ICD-10-CM | POA: Diagnosis not present

## 2024-01-18 DIAGNOSIS — K50912 Crohn's disease, unspecified, with intestinal obstruction: Secondary | ICD-10-CM | POA: Diagnosis not present

## 2024-01-18 DIAGNOSIS — I10 Essential (primary) hypertension: Secondary | ICD-10-CM

## 2024-01-18 DIAGNOSIS — E785 Hyperlipidemia, unspecified: Secondary | ICD-10-CM

## 2024-01-18 MED ORDER — NICOTINE 14 MG/24HR TD PT24
14.0000 mg | MEDICATED_PATCH | Freq: Every day | TRANSDERMAL | 0 refills | Status: AC
  Filled 2024-01-18: qty 28, 28d supply, fill #0

## 2024-01-18 MED ORDER — DOCUSATE SODIUM 250 MG PO CAPS
250.0000 mg | ORAL_CAPSULE | Freq: Every day | ORAL | 0 refills | Status: AC | PRN
  Filled 2024-01-18 – 2024-03-13 (×2): qty 30, 30d supply, fill #0

## 2024-01-18 MED ORDER — EMPAGLIFLOZIN 10 MG PO TABS
10.0000 mg | ORAL_TABLET | Freq: Every day | ORAL | 0 refills | Status: DC
  Filled 2024-01-18: qty 90, 90d supply, fill #0

## 2024-01-18 NOTE — Assessment & Plan Note (Addendum)
 Smokes about  less than 0.5 pack/day  Asked about quitting: confirms that he/she currently smokes cigarettes Advise to quit smoking: Educated about QUITTING to reduce the risk of cancer, cardio and cerebrovascular disease. Assess willingness: Unwilling to quit at this time, but is working on cutting back. Assist with counseling and pharmacotherapy: Counseled for 5 minutes and literature provided. Arrange for follow up: follow up in 2 months and continue to offer help.    - nicotine (NICODERM CQ - DOSED IN MG/24 HOURS) 14 mg/24hr patch; Place 1 patch (14 mg total) onto the skin daily.  Dispense: 42 patch; Refill: 0

## 2024-01-18 NOTE — Assessment & Plan Note (Signed)
 Patient encouraged to increase intake of fiber drink at least 64 ounces of water daily to maintain hydration  - docusate sodium (COLACE) 250 MG capsule; Take 1 capsule (250 mg total) by mouth daily as needed for constipation.  Dispense: 30 capsule; Refill: 0

## 2024-01-18 NOTE — Assessment & Plan Note (Addendum)
 BP Readings from Last 3 Encounters:  01/18/24 135/63  12/20/23 129/63  11/22/23 (!) 148/74  Systolic blood pressure is slightly elevated but diastolic blood pressure is well-controlled ,continue lisinopril 20 mg daily Discussed DASH diet and dietary sodium restrictions Continue to increase dietary efforts and exercise.

## 2024-01-18 NOTE — Assessment & Plan Note (Signed)
 LDL goal is less than 70 Currently on atorvastatin 10 mg daily Checking lipid panel Lab Results  Component Value Date   CHOL 108 01/18/2023   HDL 34 (L) 01/18/2023   LDLCALC 54 01/18/2023   TRIG 107 01/18/2023   CHOLHDL 3.2 01/18/2023

## 2024-01-18 NOTE — Assessment & Plan Note (Addendum)
 Lab Results  Component Value Date   HGBA1C 9.2 (A) 12/20/2023  Chronic medical condition currently uncontrolled Continue metformin 1000 mg twice daily, Tradjenta 5 mg daily Checking with GI to see if patient can resume Jardiance Patient counseled on low-carb diet Encouraged to engage in regular moderate exercise at least 150 minutes weekly CBG goals discussed Diabetic foot exam completed today Follow-up in 2 months

## 2024-01-18 NOTE — Patient Instructions (Signed)
 Please consider getting Shingrix and pneumococcal  vaccine at local pharmacy.   Goal for fasting blood sugar ranges from 80 to 120 and 2 hours after any meal or at bedtime should be between 130 to 170.    For constipation it is important that you have an adequate intake of fruit and vegetables daily, at least 3 servings of each, as well as water intake of at least 48 ounces daily and regular exercise.   1. Type 2 diabetes mellitus without complication, without long-term current use of insulin (HCC) (Primary)  - Lipid panel  2. Tobacco user  - nicotine (NICODERM CQ - DOSED IN MG/24 HOURS) 14 mg/24hr patch; Place 1 patch (14 mg total) onto the skin daily.  Dispense: 42 patch; Refill: 0  3. Essential hypertension   4. Constipation, unspecified constipation type  - docusate sodium (COLACE) 250 MG capsule; Take 1 capsule (250 mg total) by mouth daily as needed for constipation.  Dispense: 30 capsule; Refill: 0    It is important that you exercise regularly at least 30 minutes 5 times a week as tolerated  Think about what you will eat, plan ahead. Choose " clean, green, fresh or frozen" over canned, processed or packaged foods which are more sugary, salty and fatty. 70 to 75% of food eaten should be vegetables and fruit. Three meals at set times with snacks allowed between meals, but they must be fruit or vegetables. Aim to eat over a 12 hour period , example 7 am to 7 pm, and STOP after  your last meal of the day. Drink water,generally about 64 ounces per day, no other drink is as healthy. Fruit juice is best enjoyed in a healthy way, by EATING the fruit.  Thanks for choosing [facility] we consider it a privelige to serve you.

## 2024-01-18 NOTE — Progress Notes (Addendum)
 Established Patient Office Visit  Subjective:  Patient ID: Jared [name], male    DOB: [birth year]  Age: 58 y.o. MRN: [id number]  CC:  Chief Complaint  Patient presents with   Diabetes    HPI Jared [name] is a 58 y.o. male  has a past medical history of Crohn disease (HCC) (2008), Diabetes mellitus type II, and Hypertension.  Patient presented establish care for his chronic medical conditions .previous PCP Julianne Handler NP   Hypertension.  Currently on lisinopril 20 mg daily.  Patient denies chest pain, shortness of breath, edema  Uncontrolled type 2 diabetes.  Currently on Tradjenta 5 mg daily, metformin 1000 mg twice daily, takes atorvastatin 10 mg daily for hyperlipidemia.  Stated that the GI specialist told him to stop taking Jardiance when he started treatment for Crohn's disease.  Patient denies polyuria polyphagia polydipsia  Crohn's disease.  Currently on Skyrizi 360 mg injection every 8 weeks.  Patient denies abdominal pain, nausea, vomiting, hematochezia.  Tobacco use disorder.  States that 1 pack of cigarettes lasts him 3 days.  Currently denies cough, shortness of breath, wheezing.  He is interested in smoking cessation  Due for pneumococcal vaccine, shingles vaccine.  Need for both vaccines discussed patient encouraged to get the vaccines at the pharmacy.  He declined flu vaccine in the office today    Past Medical History:  Diagnosis Date   Crohn disease (HCC) 2008   with hospital admission in 2011, and 8/12 for flare up and SBO relieved with bowel rest. no suregery, no meds for CD   Diabetes mellitus type II    Hypertension     Past Surgical History:  Procedure Laterality Date   BOWEL RESECTION  01/25/2012   Procedure: SMALL BOWEL RESECTION;  Surgeon: Clovis Pu. 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 Cornett, MD;  Location: [facility] OR;  Service: General;  Laterality: N/A;   SCROTAL SURGERY  ~ 73   "took out a pellet"    Family History  Problem Relation Age of Onset   Diabetes Mother    Alopecia Mother    Coronary artery disease Mother    Lung cancer Mother        smoker but had quit   Dementia Father    Diabetes Sister    Hyperlipidemia Sister    Hypertension Sister    Angina Brother    Hepatitis Brother    Alcohol abuse Brother    Diabetes Brother    Colon cancer Paternal Grandmother    Esophageal cancer Neg Hx    Stomach cancer Neg Hx    Rectal cancer Neg Hx     Social History   Socioeconomic History   Marital status: Single    Spouse name: Not on file   Number of children: 0   Years of education: Not on file   Highest education level: Not on file  Occupational History   Not on file  Tobacco Use   Smoking status: Some Days    Current packs/day: 0.25    Average packs/day: 0.3 packs/day for 30.0 years (7.5 ttl pk-yrs)    Types: Cigarettes   Smokeless tobacco: Former    Types: Chew    Quit date: 01/20/1997  Vaping Use   Vaping status: Never Used  Substance and Sexual Activity   Alcohol  use: Not Currently   Drug use: No   Sexual activity: Not Currently  Other Topics Concern   Not on file  Social History Narrative   Lives in [place].Is single. Has a sister in [place]. He has a twin brother that passed away from autoimmune hepatitis.  Smokes 2-3 cigarettes a day. Drinks beer once every few months. No history of drug abuse. Studied up to 12th grade. Has orange card. No health insurance. Currently employed cleaning buildings.   Social Drivers of Corporate investment banker Strain: Low Risk  (01/27/2023)   Overall Financial Resource Strain (CARDIA)    Difficulty of Paying Living Expenses: Not hard at all  Food Insecurity: No Food Insecurity (04/20/2023)   Hunger Vital Sign    Worried About Running Out of Food in the Last Year: Never true    Ran Out of Food  in the Last Year: Never true  Transportation Needs: No Transportation Needs (04/20/2023)   PRAPARE - Administrator, Civil Service (Medical): No    Lack of Transportation (Non-Medical): No  Physical Activity: Insufficiently Active (01/27/2023)   Exercise Vital Sign    Days of Exercise per Week: 2 days    Minutes of Exercise per Session: 20 min  Stress: No Stress Concern Present (01/27/2023)   [employer] of Occupational Health - Occupational Stress Questionnaire    Feeling of Stress : Not at all  Social Connections: Socially Isolated (01/27/2023)   Social Connection and Isolation Panel [NHANES]    Frequency of Communication with Friends and Family: More than three times a week    Frequency of Social Gatherings with Friends and Family: More than three times a week    Attends Religious Services: Never    Database administrator or Organizations: No    Attends Banker Meetings: Never    Marital Status: Never married  Intimate Partner Violence: Not At Risk (04/17/2023)   Humiliation, Afraid, Rape, and Kick questionnaire    Fear of Current or Ex-Partner: No    Emotionally Abused: No    Physically Abused: No    Sexually Abused: No    Outpatient Medications Prior to Visit  Medication Sig Dispense Refill   atorvastatin (LIPITOR) 10 MG tablet TAKE 1 TABLET BY MOUTH DAILY 30 tablet 10   diphenhydrAMINE (BENADRYL) 25 mg capsule Take 1 capsule (25 mg total) by mouth every 6 (six) hours as needed for itching. 20 capsule 0   hydrOXYzine (ATARAX) 10 MG tablet Take 1 tablet (10 mg total) by mouth 3 (three) times daily as needed. 30 tablet 5   lidocaine (LIDODERM) 5 % Place 1 patch onto the skin daily. Remove & Discard patch within 12 hours or as directed by MD 30 patch 0   linagliptin (TRADJENTA) 5 MG TABS tablet TAKE 1 TABLET BY MOUTH DAILY 90 tablet 10   lisinopril (ZESTRIL) 20 MG tablet TAKE 1 TABLET BY MOUTH DAILY 30 tablet 10   metFORMIN (GLUCOPHAGE) 1000 MG tablet TAKE  ONE (1) TABLET BY MOUTH TWICE DAILY WITH A MEAL 60 tablet 10   Risankizumab-rzaa (SKYRIZI) 360 MG/2.4ML SOCT Inject 360 mg into the skin every 8 (eight) weeks. 2.4 mL 6   empagliflozin (JARDIANCE) 10 MG TABS tablet Take 1 tablet (10 mg total) by mouth daily before breakfast. (Patient not taking: Reported on 01/18/2024) 90 tablet 0   meclizine (ANTIVERT) 25 MG tablet Take 1 tablet (25 mg total) by mouth 3 (three) times daily as needed for dizziness. (Patient  not taking: Reported on 01/18/2024) 21 tablet 0   meloxicam (MOBIC) 7.5 MG tablet Take 1 tablet (7.5 mg total) by mouth 2 (two) times daily as needed for pain. (Patient not taking: Reported on 01/18/2024) 30 tablet 2   omega-3 acid ethyl esters (LOVAZA) 1 g capsule Take 2 g by mouth 2 (two) times daily. (Patient not taking: Reported on 01/18/2024)     naproxen (NAPROSYN) 500 MG tablet Take 1 tablet (500 mg total) by mouth 2 (two) times daily. (Patient not taking: Reported on 01/18/2024) 30 tablet 0   No facility-administered medications prior to visit.    Allergies  Allergen Reactions   Trazodone And Nefazodone     hallucinations    ROS Review of Systems  Constitutional:  Negative for appetite change, chills, fatigue and fever.  HENT:  Negative for congestion, postnasal drip, rhinorrhea and sneezing.   Respiratory:  Negative for cough, shortness of breath and wheezing.   Cardiovascular:  Negative for chest pain, palpitations and leg swelling.  Gastrointestinal:  Positive for constipation. Negative for abdominal pain, diarrhea, nausea and vomiting.  Genitourinary:  Negative for difficulty urinating, dysuria, flank pain and frequency.  Musculoskeletal:  Negative for arthralgias, back pain, joint swelling and myalgias.  Skin:  Negative for color change, pallor, rash and wound.  Neurological:  Negative for dizziness, facial asymmetry, weakness, numbness and headaches.  Psychiatric/Behavioral:  Negative for behavioral problems, confusion, self-injury  and suicidal ideas.       Objective:    Physical Exam Vitals and nursing note reviewed.  Constitutional:      General: He is not in acute distress.    Appearance: Normal appearance. He is not ill-appearing, toxic-appearing or diaphoretic.  HENT:     Mouth/Throat:     Mouth: Mucous membranes are moist.     Pharynx: Oropharynx is clear. No oropharyngeal exudate or posterior oropharyngeal erythema.  Eyes:     General: No scleral icterus.       Right eye: No discharge.        Left eye: No discharge.     Extraocular Movements: Extraocular movements intact.     Conjunctiva/sclera: Conjunctivae normal.  Cardiovascular:     Rate and Rhythm: Normal rate and regular rhythm.     Pulses: Normal pulses.     Heart sounds: Normal heart sounds. No murmur heard.    No friction rub. No gallop.  Pulmonary:     Effort: Pulmonary effort is normal. No respiratory distress.     Breath sounds: Normal breath sounds. No stridor. No wheezing, rhonchi or rales.  Chest:     Chest wall: No tenderness.  Abdominal:     General: There is no distension.     Palpations: Abdomen is soft.     Tenderness: There is no abdominal tenderness. There is no right CVA tenderness, left CVA tenderness or guarding.  Musculoskeletal:        General: No swelling, tenderness, deformity or signs of injury.     Right lower leg: No edema.     Left lower leg: No edema.  Skin:    General: Skin is warm and dry.     Capillary Refill: Capillary refill takes less than 2 seconds.     Coloration: Skin is not jaundiced or pale.     Findings: No bruising, erythema or lesion.  Neurological:     Mental Status: He is alert and oriented to person, place, and time.     Motor: No weakness.  Coordination: Coordination normal.     Gait: Gait normal.  Psychiatric:        Mood and Affect: Mood normal.        Behavior: Behavior normal.        Thought Content: Thought content normal.        Judgment: Judgment normal.     BP 135/63    Pulse 84   Temp (!) 97.3 F (36.3 C)   Wt 215 lb (97.5 kg)   SpO2 99%   BMI 27.60 kg/m  Wt Readings from Last 3 Encounters:  01/18/24 215 lb (97.5 kg)  12/20/23 215 lb 9.6 oz (97.8 kg)  11/11/23 216 lb (98 kg)    No results found for: "TSH" Lab Results  Component Value Date   WBC 7.9 11/11/2023   HGB 11.8 (L) 11/11/2023   HCT 38.8 (L) 11/11/2023   MCV 89.6 11/11/2023   PLT 303 11/11/2023   Lab Results  Component Value Date   NA 138 11/11/2023   K 3.9 11/11/2023   CO2 22 11/11/2023   GLUCOSE 205 (H) 11/11/2023   BUN 14 11/11/2023   CREATININE 1.07 11/11/2023   BILITOT 0.5 11/11/2023   ALKPHOS 60 11/11/2023   AST 21 11/11/2023   ALT 23 11/11/2023   PROT 7.1 11/11/2023   ALBUMIN 3.6 11/11/2023   CALCIUM 8.9 11/11/2023   ANIONGAP 8 11/11/2023   EGFR 92 09/20/2023   Lab Results  Component Value Date   CHOL 108 01/18/2023   Lab Results  Component Value Date   HDL 34 (L) 01/18/2023   Lab Results  Component Value Date   LDLCALC 54 01/18/2023   Lab Results  Component Value Date   TRIG 107 01/18/2023   Lab Results  Component Value Date   CHOLHDL 3.2 01/18/2023   Lab Results  Component Value Date   HGBA1C 9.2 (A) 12/20/2023      Assessment & Plan:   Problem List Items Addressed This Visit       Cardiovascular and Mediastinum   Essential hypertension   BP Readings from Last 3 Encounters:  01/18/24 135/63  12/20/23 129/63  11/22/23 (!) 148/74  Systolic blood pressure is slightly elevated but diastolic blood pressure is well-controlled ,continue lisinopril 20 mg daily Discussed DASH diet and dietary sodium restrictions Continue to increase dietary efforts and exercise.           Digestive   Acute Crohn's disease with intestinal obstruction (HCC)   Continue Skyrizi 160 mg every 8 weeks Encouraged to maintain close follow-up with GI        Endocrine   Type 2 diabetes mellitus without complication, without long-term current use of insulin  (HCC) - Primary   Lab Results  Component Value Date   HGBA1C 9.2 (A) 12/20/2023  Chronic medical condition currently uncontrolled Continue metformin 1000 mg twice daily, Tradjenta 5 mg daily Checking with GI to see if patient can resume Jardiance Patient counseled on low-carb diet Encouraged to engage in regular moderate exercise at least 150 minutes weekly CBG goals discussed Diabetic foot exam completed today Follow-up in 2 months      Relevant Orders   Lipid panel     Other   Tobacco user   Smokes about  less than 0.5 pack/day  Asked about quitting: confirms that he/she currently smokes cigarettes Advise to quit smoking: Educated about QUITTING to reduce the risk of cancer, cardio and cerebrovascular disease. Assess willingness: Unwilling to quit at this time, but is working  on cutting back. Assist with counseling and pharmacotherapy: Counseled for 5 minutes and literature provided. Arrange for follow up: follow up in 2 months and continue to offer help.    - nicotine (NICODERM CQ - DOSED IN MG/24 HOURS) 14 mg/24hr patch; Place 1 patch (14 mg total) onto the skin daily.  Dispense: 42 patch; Refill: 0       Relevant Medications   nicotine (NICODERM CQ - DOSED IN MG/24 HOURS) 14 mg/24hr patch   Dyslipidemia   LDL goal is less than 70 Currently on atorvastatin 10 mg daily Checking lipid panel Lab Results  Component Value Date   CHOL 108 01/18/2023   HDL 34 (L) 01/18/2023   LDLCALC 54 01/18/2023   TRIG 107 01/18/2023   CHOLHDL 3.2 01/18/2023         Constipation   Patient encouraged to increase intake of fiber drink at least 64 ounces of water daily to maintain hydration  - docusate sodium (COLACE) 250 MG capsule; Take 1 capsule (250 mg total) by mouth daily as needed for constipation.  Dispense: 30 capsule; Refill: 0       Relevant Medications   docusate sodium (COLACE) 250 MG capsule    Meds ordered this encounter  Medications   docusate sodium (COLACE)  250 MG capsule    Sig: Take 1 capsule (250 mg total) by mouth daily as needed for constipation.    Dispense:  30 capsule    Refill:  0   nicotine (NICODERM CQ - DOSED IN MG/24 HOURS) 14 mg/24hr patch    Sig: Place 1 patch (14 mg total) onto the skin daily.    Dispense:  42 patch    Refill:  0    Follow-up: Return in about 2 months (around 03/19/2024) for DM, HTN.    Donell Beers, FNP

## 2024-01-18 NOTE — Assessment & Plan Note (Signed)
 Continue Skyrizi 160 mg every 8 weeks Encouraged to maintain close follow-up with GI

## 2024-01-19 LAB — LIPID PANEL
Chol/HDL Ratio: 2.9 ratio (ref 0.0–5.0)
Cholesterol, Total: 90 mg/dL — ABNORMAL LOW (ref 100–199)
HDL: 31 mg/dL — ABNORMAL LOW (ref >39)
LDL Chol Calc (NIH): 44 mg/dL (ref 0–99)
Triglycerides: 65 mg/dL (ref 0–149)
VLDL Cholesterol Cal: 15 mg/dL (ref 5–40)

## 2024-01-23 ENCOUNTER — Ambulatory Visit (INDEPENDENT_AMBULATORY_CARE_PROVIDER_SITE_OTHER): Payer: Self-pay | Admitting: Nurse Practitioner

## 2024-01-23 ENCOUNTER — Encounter: Payer: Self-pay | Admitting: Nurse Practitioner

## 2024-01-23 VITALS — BP 127/64 | HR 83 | Ht 74.0 in | Wt 207.0 lb

## 2024-01-23 DIAGNOSIS — E119 Type 2 diabetes mellitus without complications: Secondary | ICD-10-CM

## 2024-01-23 NOTE — Progress Notes (Signed)
 Appointment not needed today , patient was seen recently

## 2024-02-03 ENCOUNTER — Other Ambulatory Visit (HOSPITAL_COMMUNITY): Payer: Self-pay

## 2024-02-07 ENCOUNTER — Encounter: Payer: Self-pay | Admitting: Family Medicine

## 2024-02-20 ENCOUNTER — Other Ambulatory Visit: Payer: Self-pay | Admitting: Family Medicine

## 2024-02-20 DIAGNOSIS — E1140 Type 2 diabetes mellitus with diabetic neuropathy, unspecified: Secondary | ICD-10-CM

## 2024-02-29 ENCOUNTER — Other Ambulatory Visit: Payer: Self-pay | Admitting: Family Medicine

## 2024-02-29 DIAGNOSIS — E1140 Type 2 diabetes mellitus with diabetic neuropathy, unspecified: Secondary | ICD-10-CM

## 2024-02-29 DIAGNOSIS — I10 Essential (primary) hypertension: Secondary | ICD-10-CM

## 2024-03-06 DIAGNOSIS — G8929 Other chronic pain: Secondary | ICD-10-CM | POA: Diagnosis not present

## 2024-03-06 DIAGNOSIS — M25511 Pain in right shoulder: Secondary | ICD-10-CM | POA: Diagnosis not present

## 2024-03-06 DIAGNOSIS — E1159 Type 2 diabetes mellitus with other circulatory complications: Secondary | ICD-10-CM | POA: Diagnosis not present

## 2024-03-06 DIAGNOSIS — M25512 Pain in left shoulder: Secondary | ICD-10-CM | POA: Diagnosis not present

## 2024-03-06 DIAGNOSIS — E785 Hyperlipidemia, unspecified: Secondary | ICD-10-CM | POA: Diagnosis not present

## 2024-03-06 DIAGNOSIS — Z720 Tobacco use: Secondary | ICD-10-CM | POA: Diagnosis not present

## 2024-03-06 DIAGNOSIS — I152 Hypertension secondary to endocrine disorders: Secondary | ICD-10-CM | POA: Diagnosis not present

## 2024-03-09 ENCOUNTER — Telehealth: Payer: Self-pay | Admitting: Nurse Practitioner

## 2024-03-09 NOTE — Telephone Encounter (Signed)
 Patient was identified as falling into the [place] Measure - Diabetes.   Patient was: Appointment scheduled with primary care provider in the next 30 days.  Appt 03/21/2024

## 2024-03-13 ENCOUNTER — Other Ambulatory Visit (HOSPITAL_COMMUNITY): Payer: Self-pay

## 2024-03-13 ENCOUNTER — Other Ambulatory Visit: Payer: Self-pay

## 2024-03-16 ENCOUNTER — Other Ambulatory Visit: Payer: Self-pay

## 2024-03-16 ENCOUNTER — Other Ambulatory Visit: Payer: Self-pay | Admitting: Nurse Practitioner

## 2024-03-16 DIAGNOSIS — M19011 Primary osteoarthritis, right shoulder: Secondary | ICD-10-CM

## 2024-03-16 DIAGNOSIS — M47812 Spondylosis without myelopathy or radiculopathy, cervical region: Secondary | ICD-10-CM

## 2024-03-16 DIAGNOSIS — G8929 Other chronic pain: Secondary | ICD-10-CM

## 2024-03-16 MED ORDER — TIZANIDINE HCL 2 MG PO TABS
2.0000 mg | ORAL_TABLET | Freq: Two times a day (BID) | ORAL | 1 refills | Status: DC | PRN
Start: 2024-03-16 — End: 2024-07-31
  Filled 2024-03-16: qty 30, 15d supply, fill #0

## 2024-03-16 NOTE — Telephone Encounter (Signed)
 Please advise [facility]

## 2024-03-21 ENCOUNTER — Ambulatory Visit: Payer: Self-pay | Admitting: Nurse Practitioner

## 2024-03-22 ENCOUNTER — Other Ambulatory Visit: Payer: Self-pay

## 2024-03-23 ENCOUNTER — Other Ambulatory Visit: Payer: Self-pay

## 2024-04-03 ENCOUNTER — Telehealth: Payer: Self-pay | Admitting: Nurse Practitioner

## 2024-04-03 NOTE — Telephone Encounter (Signed)
 Patient was identified as falling into the [place] Measure - Diabetes.   Patient was: Appointment scheduled with primary care provider in the next 30 days.  05/08/24

## 2024-04-23 ENCOUNTER — Encounter (HOSPITAL_COMMUNITY): Payer: Self-pay

## 2024-04-23 ENCOUNTER — Emergency Department (HOSPITAL_COMMUNITY)
Admission: EM | Admit: 2024-04-23 | Discharge: 2024-04-24 | Disposition: A | Discharge status: Home / Self Care | Attending: Emergency Medicine | Admitting: Emergency Medicine

## 2024-04-23 ENCOUNTER — Emergency Department (HOSPITAL_COMMUNITY)

## 2024-04-23 DIAGNOSIS — Z23 Encounter for immunization: Secondary | ICD-10-CM | POA: Insufficient documentation

## 2024-04-23 DIAGNOSIS — S61215A Laceration without foreign body of left ring finger without damage to nail, initial encounter: Secondary | ICD-10-CM | POA: Diagnosis not present

## 2024-04-23 DIAGNOSIS — W269XXA Contact with unspecified sharp object(s), initial encounter: Secondary | ICD-10-CM | POA: Insufficient documentation

## 2024-04-23 DIAGNOSIS — S61213A Laceration without foreign body of left middle finger without damage to nail, initial encounter: Secondary | ICD-10-CM | POA: Insufficient documentation

## 2024-04-23 NOTE — ED Triage Notes (Signed)
 Pt states that he was cutting up some meat for his dogs and cut his L middle and ring finger, last tetanus unknown.

## 2024-04-24 ENCOUNTER — Other Ambulatory Visit: Payer: Self-pay

## 2024-04-24 DIAGNOSIS — S61213A Laceration without foreign body of left middle finger without damage to nail, initial encounter: Secondary | ICD-10-CM | POA: Diagnosis not present

## 2024-04-24 MED ORDER — CEPHALEXIN 500 MG PO CAPS
500.0000 mg | ORAL_CAPSULE | Freq: Once | ORAL | Status: AC
  Administered 2024-04-24: 500 mg via ORAL
  Filled 2024-04-24: qty 1

## 2024-04-24 MED ORDER — CEPHALEXIN 500 MG PO CAPS
500.0000 mg | ORAL_CAPSULE | Freq: Two times a day (BID) | ORAL | 0 refills | Status: DC
  Filled 2024-04-24: qty 20, 10d supply, fill #0

## 2024-04-24 MED ORDER — TETANUS-DIPHTH-ACELL PERTUSSIS 5-2.5-18.5 LF-MCG/0.5 IM SUSY
0.5000 mL | PREFILLED_SYRINGE | Freq: Once | INTRAMUSCULAR | Status: AC
  Administered 2024-04-24: 0.5 mL via INTRAMUSCULAR
  Filled 2024-04-24: qty 0.5

## 2024-04-24 MED ORDER — LIDOCAINE-EPINEPHRINE (PF) 2 %-1:200000 IJ SOLN
10.0000 mL | Freq: Once | INTRAMUSCULAR | Status: DC
  Filled 2024-04-24: qty 20

## 2024-04-24 NOTE — ED Notes (Signed)
 Dressing applied and splint put on

## 2024-04-24 NOTE — ED Provider Notes (Signed)
 [place] [facility]MENT AT [facility] Provider Note   CSN: [id number] Arrival date & time: 04/23/24  2141     History  Chief Complaint  Patient presents with   Laceration    Jared [name] is a 58 y.o. male presents today for a laceration to his left middle and ring finger.  Patient states that he was cutting up some meat for his dogs and accidentally cut his finger.  Patient is unsure of when his last tetanus was.  Patient denies numbness, tingling, or weakness.   Laceration      Home Medications Prior to Admission medications   Medication Sig Start Date End Date Taking? Authorizing Provider  atorvastatin  (LIPITOR) 10 MG tablet TAKE 1 TABLET BY MOUTH DAILY 03/01/24   Nichols, Tonya S, NP  cephALEXin (KEFLEX) 500 MG capsule Take 1 capsule (500 mg total) by mouth 2 (two) times daily. 04/24/24   Carie Charity, PA-C  docusate sodium  (COLACE) 250 MG capsule Take 1 capsule (250 mg total) by mouth daily as needed for constipation. 01/18/24   Paseda, Folashade R, FNP  empagliflozin  (JARDIANCE ) 10 MG TABS tablet Take 1 tablet (10 mg total) by mouth daily before breakfast. 01/18/24   Paseda, Folashade R, FNP  hydrOXYzine  (ATARAX ) 10 MG tablet Take 1 tablet (10 mg total) by mouth 3 (three) times daily as needed. 05/02/23   Sigurd Driver, FNP  lisinopril  (ZESTRIL ) 20 MG tablet TAKE 1 TABLET BY MOUTH DAILY 03/01/24   Nichols, Tonya S, NP  meclizine  (ANTIVERT ) 25 MG tablet Take 1 tablet (25 mg total) by mouth 3 (three) times daily as needed for dizziness. 11/11/23   Trish Furl, MD  meloxicam  (MOBIC ) 7.5 MG tablet Take 1 tablet (7.5 mg total) by mouth 2 (two) times daily as needed for pain. 10/18/23   Wes Hamman, MD  metFORMIN  (GLUCOPHAGE ) 1000 MG tablet TAKE ONE (1) TABLET BY MOUTH TWICE DAILY WITH A MEAL 03/01/24   Nichols, Tonya S, NP  nicotine  (NICODERM CQ  - DOSED IN MG/24 HOURS) 14 mg/24hr patch Place 1 patch (14 mg total) onto the skin daily. 01/18/24   Paseda, Folashade R,  FNP  Risankizumab -rzaa (SKYRIZI ) 360 MG/2.4ML SOCT Inject 360 mg into the skin every 8 (eight) weeks. 12/26/23   Elois Hair, MD  tiZANidine  (ZANAFLEX ) 2 MG tablet Take 1 tablet (2 mg total) by mouth 2 (two) times daily as needed for muscle spasms. 03/16/24   Paseda, Folashade R, FNP  TRADJENTA  5 MG TABS tablet TAKE 1 TABLET BY MOUTH DAILY 02/20/24   Nichols, Tonya S, NP  furosemide (LASIX) 10 MG/ML solution Take by mouth daily.  01/20/12  [provider]  mesalamine (PENTASA) 250 MG CR capsule Take 1,000 mg by mouth 4 (four) times daily.  01/20/12  [provider]  ONGLYZA 5 MG TABS tablet TAKE 1 TABLET (5 MG TOTAL) BY MOUTH DAILY. Patient not taking: Reported on 01/23/2024 06/02/20 06/03/20  Sigurd Driver, FNP  pioglitazone (ACTOS) 15 MG tablet Take by mouth daily. Patient not taking: Reported on 01/23/2024  01/20/12  [provider]      Allergies    Trazodone  and nefazodone    Review of Systems   Review of Systems  Skin:  Positive for wound.    Physical Exam Updated Vital Signs BP (!) 142/87   Pulse 77   Temp 98 F (36.7 C) (Oral)   Resp 18   SpO2 100%  Physical Exam Vitals and nursing note reviewed.  Constitutional:      General: He is not in acute distress.    Appearance: He is well-developed.  HENT:     Head: Normocephalic and atraumatic.     Right Ear: External ear normal.     Left Ear: External ear normal.     Nose: Nose normal.     Mouth/Throat:     Mouth: Mucous membranes are moist.  Eyes:     Conjunctiva/sclera: Conjunctivae normal.  Cardiovascular:     Rate and Rhythm: Normal rate and regular rhythm.     Heart sounds: No murmur heard. Pulmonary:     Effort: Pulmonary effort is normal. No respiratory distress.     Breath sounds: Normal breath sounds.  Abdominal:     Palpations: Abdomen is soft.     Tenderness: There is no abdominal tenderness.  Musculoskeletal:        General: Signs of injury present. No swelling.     Cervical  back: Neck supple.     Comments: Patient with lacerations to PIPs of left middle and ring finger.  Patient has full ROM of left ring finger and reduced extension of the left middle finger PIP and flexion of the DIP.  Patient is neurovascularly intact.  +2 radial pulses.  Laceration on left middle finger approximately 1-1/2 cm long, laceration on left ring finger approximately 1 cm long.  Skin:    General: Skin is warm and dry.     Capillary Refill: Capillary refill takes less than 2 seconds.  Neurological:     General: No focal deficit present.     Mental Status: He is alert.  Psychiatric:        Mood and Affect: Mood normal.     ED Results / Procedures / Treatments   Labs (all labs ordered are listed, but only abnormal results are displayed) Labs Reviewed - No data to display  EKG None  Radiology DG Finger Middle Left Result Date: 04/23/2024 CLINICAL DATA:  Laceration left hand middle finger at the proximal interphalangeal joint dorsally. EXAM: LEFT MIDDLE FINGER 2+V COMPARISON:  None Available. FINDINGS: Three views of the left middle finger are obtained. There is soft tissue swelling in the middle finger, greatest at the mid segment where there appears to be a laceration dorsally at the level of the proximal interphalangeal joint. There is moderate flexion at the proximal interphalangeal joint which could simply be positional or could indicate a dorsal ligamentous or tendon injury. There is no evidence of fracture, dislocation or arthritic changes. The bone mineralization is normal. IMPRESSION: 1. Soft tissue swelling and laceration dorsally at the level of the proximal interphalangeal joint. 2. Moderate flexion at the proximal interphalangeal joint which could simply be positional or could indicate a dorsal ligamentous or tendon injury. 3. No evidence of fractures. Electronically Signed   By: Denman Fischer M.D.   On: 04/23/2024 22:09    Procedures .Laceration Repair  Date/Time:  04/24/2024 3:56 AM  Performed by: Carie Charity, PA-C Authorized by: Carie Charity, PA-C   Consent:    Consent obtained:  Verbal   Consent given by:  Patient   Risks discussed:  Infection, pain, poor cosmetic result and poor wound healing   Alternatives discussed:  No treatment Universal protocol:    Patient identity confirmed:  Arm band Anesthesia:    Anesthesia method:  Nerve block   Block location:  Left middle   Block needle gauge:  27 G   Block anesthetic:  Lidocaine  2% WITH  epi   Block injection procedure:  Anatomic landmarks palpated and negative aspiration for blood   Block outcome:  Anesthesia achieved Laceration details:    Location:  Finger   Finger location:  L long finger   Length (cm):  1.5   Depth (mm):  3 Exploration:    Hemostasis achieved with:  Direct pressure   Imaging obtained: x-ray     Imaging outcome: foreign body not noted     Wound exploration: entire depth of wound visualized   Treatment:    Area cleansed with:  Saline   Amount of cleaning:  Extensive Skin repair:    Repair method:  Sutures   Suture size:  5-0   Suture material:  Prolene   Suture technique:  Simple interrupted   Number of sutures:  5 Approximation:    Approximation:  Close Repair type:    Repair type:  Simple Post-procedure details:    Dressing:  Non-adherent dressing and splint for protection   Procedure completion:  Tolerated .Laceration Repair  Date/Time: 04/24/2024 3:57 AM  Performed by: Carie Charity, PA-C Authorized by: Carie Charity, PA-C   Consent:    Consent obtained:  Verbal   Consent given by:  Patient   Risks discussed:  Infection, pain, poor wound healing and poor cosmetic result   Alternatives discussed:  No treatment Universal protocol:    Patient identity confirmed:  Arm band Anesthesia:    Anesthesia method:  Nerve block   Block location:  Left ring finger   Block needle gauge:  27 G   Block anesthetic:  Lidocaine  2% WITH epi   Block injection  procedure:  Negative aspiration for blood and anatomic landmarks palpated   Block outcome:  Anesthesia achieved Laceration details:    Location:  Finger   Finger location:  L ring finger   Length (cm):  1   Depth (mm):  2 Exploration:    Hemostasis achieved with:  Direct pressure   Imaging outcome: foreign body not noted     Wound exploration: wound explored through full range of motion and entire depth of wound visualized   Treatment:    Area cleansed with:  Saline   Amount of cleaning:  Extensive   Irrigation solution:  Sterile saline Skin repair:    Repair method:  Sutures   Suture size:  5-0   Suture material:  Prolene   Suture technique:  Simple interrupted   Number of sutures:  3 Approximation:    Approximation:  Close Repair type:    Repair type:  Simple Post-procedure details:    Dressing:  Non-adherent dressing   Procedure completion:  Tolerated     Medications Ordered in ED Medications  lidocaine -EPINEPHrine (XYLOCAINE  W/EPI) 2 %-1:200000 (PF) injection 10 mL (has no administration in time range)  Tdap (BOOSTRIX ) injection 0.5 mL (0.5 mLs Intramuscular Given 04/24/24 0122)  cephALEXin (KEFLEX) capsule 500 mg (500 mg Oral Given 04/24/24 0351)    ED Course/ Medical Decision Making/ A&P                                 Medical Decision Making Amount and/or Complexity of Data Reviewed Radiology: ordered.  Risk Prescription drug management.   This patient presents to the ED for concern of laceration differential diagnosis includes laceration, avulsion, open fracture   Imaging Studies ordered:  I ordered imaging studies including left middle finger x-ray I independently visualized and interpreted  imaging which showed soft tissue swelling and laceration dorsally at the level of the PIP.  No evidence of fractures I agree with the radiologist interpretation   Medicines ordered and prescription drug management:  I ordered medication including tetanus booster     I have reviewed the patients home medicines and have made adjustments as needed   Problem List / ED Course:  Considered for admission or further workup however patient's vital signs, physical exam, and imaging are reassuring.  Patient given prophylactic antibiotics.  Patient to follow-up with hand surgery for further evaluation workup.  Patient given return precautions.  I feel patient is safe for discharge at this time.          Final Clinical Impression(s) / ED Diagnoses Final diagnoses:  Laceration of left middle finger without foreign body without damage to nail, initial encounter  Laceration of left ring finger without foreign body without damage to nail, initial encounter    Rx / DC Orders ED Discharge Orders          Ordered    cephALEXin (KEFLEX) 500 MG capsule  2 times daily,   Status:  Discontinued        04/24/24 0333    cephALEXin (KEFLEX) 500 MG capsule  2 times daily        04/24/24 0350              Carie Charity, PA-C 04/24/24 0359    Lindle Rhea, MD 04/24/24 [phone]

## 2024-04-24 NOTE — Discharge Instructions (Signed)
 Today you were seen for lacerations of your left middle and ring finger.  Please follow-up with hand surgery for further evaluation workup.  Please pick up your antibiotic and take as prescribed.  You may also take Tylenol /Motrin  as needed for pain.  Please return to the ED if you have red streaking around your lacerations, puslike discharge, or worsening pain.  Thank you for letting us  treat you today. After reviewing your imaging, I feel you are safe to go home. Please follow up with your PCP in the next several days and provide them with your records from this visit. Return to the Emergency Room if pain becomes severe or symptoms worsen.

## 2024-05-03 DIAGNOSIS — S66822A Laceration of other specified muscles, fascia and tendons at wrist and hand level, left hand, initial encounter: Secondary | ICD-10-CM | POA: Diagnosis not present

## 2024-05-03 DIAGNOSIS — S61402A Unspecified open wound of left hand, initial encounter: Secondary | ICD-10-CM | POA: Diagnosis not present

## 2024-05-08 ENCOUNTER — Ambulatory Visit: Payer: Self-pay | Admitting: Nurse Practitioner

## 2024-05-08 DIAGNOSIS — I10 Essential (primary) hypertension: Secondary | ICD-10-CM | POA: Diagnosis not present

## 2024-05-08 DIAGNOSIS — S61213A Laceration without foreign body of left middle finger without damage to nail, initial encounter: Secondary | ICD-10-CM | POA: Diagnosis not present

## 2024-05-08 DIAGNOSIS — S66323A Laceration of extensor muscle, fascia and tendon of left middle finger at wrist and hand level, initial encounter: Secondary | ICD-10-CM | POA: Diagnosis not present

## 2024-05-14 ENCOUNTER — Inpatient Hospital Stay: Payer: Self-pay | Admitting: Nurse Practitioner

## 2024-05-17 DIAGNOSIS — S61402A Unspecified open wound of left hand, initial encounter: Secondary | ICD-10-CM | POA: Diagnosis not present

## 2024-05-17 DIAGNOSIS — M25642 Stiffness of left hand, not elsewhere classified: Secondary | ICD-10-CM | POA: Diagnosis not present

## 2024-05-17 DIAGNOSIS — M79645 Pain in left finger(s): Secondary | ICD-10-CM | POA: Diagnosis not present

## 2024-05-17 DIAGNOSIS — S66822A Laceration of other specified muscles, fascia and tendons at wrist and hand level, left hand, initial encounter: Secondary | ICD-10-CM | POA: Diagnosis not present

## 2024-05-24 DIAGNOSIS — S66822D Laceration of other specified muscles, fascia and tendons at wrist and hand level, left hand, subsequent encounter: Secondary | ICD-10-CM | POA: Diagnosis not present

## 2024-05-24 DIAGNOSIS — S61402D Unspecified open wound of left hand, subsequent encounter: Secondary | ICD-10-CM | POA: Diagnosis not present

## 2024-05-30 ENCOUNTER — Other Ambulatory Visit: Payer: Self-pay

## 2024-05-31 DIAGNOSIS — S61402D Unspecified open wound of left hand, subsequent encounter: Secondary | ICD-10-CM | POA: Diagnosis not present

## 2024-05-31 DIAGNOSIS — S66822D Laceration of other specified muscles, fascia and tendons at wrist and hand level, left hand, subsequent encounter: Secondary | ICD-10-CM | POA: Diagnosis not present

## 2024-06-05 DIAGNOSIS — M79645 Pain in left finger(s): Secondary | ICD-10-CM | POA: Diagnosis not present

## 2024-06-05 DIAGNOSIS — M25642 Stiffness of left hand, not elsewhere classified: Secondary | ICD-10-CM | POA: Diagnosis not present

## 2024-06-13 DIAGNOSIS — M79645 Pain in left finger(s): Secondary | ICD-10-CM | POA: Diagnosis not present

## 2024-06-13 DIAGNOSIS — M25642 Stiffness of left hand, not elsewhere classified: Secondary | ICD-10-CM | POA: Diagnosis not present

## 2024-06-22 ENCOUNTER — Other Ambulatory Visit: Payer: Self-pay

## 2024-06-22 ENCOUNTER — Encounter: Payer: Self-pay | Admitting: Nurse Practitioner

## 2024-06-22 ENCOUNTER — Ambulatory Visit (INDEPENDENT_AMBULATORY_CARE_PROVIDER_SITE_OTHER): Payer: Self-pay | Admitting: Nurse Practitioner

## 2024-06-22 VITALS — BP 118/65 | HR 88 | Wt 214.0 lb

## 2024-06-22 DIAGNOSIS — M79642 Pain in left hand: Secondary | ICD-10-CM | POA: Diagnosis not present

## 2024-06-22 DIAGNOSIS — D649 Anemia, unspecified: Secondary | ICD-10-CM | POA: Diagnosis not present

## 2024-06-22 DIAGNOSIS — F17210 Nicotine dependence, cigarettes, uncomplicated: Secondary | ICD-10-CM | POA: Diagnosis not present

## 2024-06-22 DIAGNOSIS — K5900 Constipation, unspecified: Secondary | ICD-10-CM

## 2024-06-22 DIAGNOSIS — Z716 Tobacco abuse counseling: Secondary | ICD-10-CM

## 2024-06-22 DIAGNOSIS — K50919 Crohn's disease, unspecified, with unspecified complications: Secondary | ICD-10-CM

## 2024-06-22 DIAGNOSIS — I10 Essential (primary) hypertension: Secondary | ICD-10-CM

## 2024-06-22 DIAGNOSIS — Z720 Tobacco use: Secondary | ICD-10-CM | POA: Diagnosis not present

## 2024-06-22 DIAGNOSIS — E119 Type 2 diabetes mellitus without complications: Secondary | ICD-10-CM

## 2024-06-22 LAB — POCT GLYCOSYLATED HEMOGLOBIN (HGB A1C): Hemoglobin A1C: 9.2 % — AB (ref 4.0–5.6)

## 2024-06-22 MED ORDER — ATORVASTATIN CALCIUM 10 MG PO TABS
10.0000 mg | ORAL_TABLET | Freq: Every day | ORAL | 1 refills | Status: AC
  Filled 2024-06-22: qty 90, 90d supply, fill #0

## 2024-06-22 MED ORDER — LISINOPRIL 20 MG PO TABS
20.0000 mg | ORAL_TABLET | Freq: Every day | ORAL | 2 refills | Status: AC
  Filled 2024-06-22: qty 90, 90d supply, fill #0

## 2024-06-22 MED ORDER — METFORMIN HCL 1000 MG PO TABS
1000.0000 mg | ORAL_TABLET | Freq: Two times a day (BID) | ORAL | 1 refills | Status: DC
Start: 2024-06-22 — End: 2024-10-01
  Filled 2024-06-22: qty 180, 90d supply, fill #0

## 2024-06-22 MED ORDER — EMPAGLIFLOZIN 10 MG PO TABS
10.0000 mg | ORAL_TABLET | Freq: Every day | ORAL | 1 refills | Status: DC
  Filled 2024-06-22: qty 30, 30d supply, fill #0

## 2024-06-22 MED ORDER — LINACLOTIDE 145 MCG PO CAPS
145.0000 ug | ORAL_CAPSULE | Freq: Every day | ORAL | 3 refills | Status: AC
  Filled 2024-06-22: qty 30, 30d supply, fill #0

## 2024-06-22 NOTE — Assessment & Plan Note (Signed)
 Lab Results  Component Value Date   WBC 7.9 11/11/2023   HGB 11.8 (L) 11/11/2023   HCT 38.8 (L) 11/11/2023   MCV 89.6 11/11/2023   PLT 303 11/11/2023   Rechecking labs

## 2024-06-22 NOTE — Assessment & Plan Note (Addendum)
  Nicotine  dependence, cigarettes Nicotine  dependence with current use of approximately one pack per week/ week. Previous dizziness with nicotine  patches and concerns about Chantix  side effects. - Discuss potential use of Chantix , emphasizing the possibility of side effects and the option to discontinue if adverse effects occur. - Encourage smoking cessation due to cardiovascular and pulmonary risks.

## 2024-06-22 NOTE — Patient Instructions (Signed)
 Goal for fasting blood sugar ranges from 80 to 120 and 2 hours after any meal or at bedtime should be between 130 to 170.   1. Type 2 diabetes mellitus without complication, without long-term current use of insulin  (HCC) (Primary)  - POCT glycosylated hemoglobin (Hb A1C) - CBC - CMP14+EGFR - empagliflozin  (JARDIANCE ) 10 MG TABS tablet; Take 1 tablet (10 mg total) by mouth daily before breakfast.  Dispense: 30 tablet; Refill: 1  2. Tobacco user   3. Anemia, unspecified type  - CBC  4. Constipation, unspecified constipation type  - linaclotide  (LINZESS ) 145 MCG CAPS capsule; Take 1 capsule (145 mcg total) by mouth daily before breakfast.  Dispense: 30 capsule; Refill: 3     It is important that you exercise regularly at least 30 minutes 5 times a week as tolerated  Think about what you will eat, plan ahead. Choose  clean, green, fresh or frozen over canned, processed or packaged foods which are more sugary, salty and fatty. 70 to 75% of food eaten should be vegetables and fruit. Three meals at set times with snacks allowed between meals, but they must be fruit or vegetables. Aim to eat over a 12 hour period , example 7 am to 7 pm, and STOP after  your last meal of the day. Drink water,generally about 64 ounces per day, no other drink is as healthy. Fruit juice is best enjoyed in a healthy way, by EATING the fruit.  Thanks for choosing [facility] we consider it a privelige to serve you.

## 2024-06-22 NOTE — Assessment & Plan Note (Signed)
 Lab Results  Component Value Date   HGBA1C 9.2 (A) 06/22/2024   Type 2 diabetes mellitus Poor glycemic control with HbA1c of 9.2%. Current medications include metformin  1000mg  BID  and Tradjenta  5mg  daily . Occasional high-carb juice consumption noted. - Restart Jardiance  10 mg daily. - Increase Jardiance  to 25 mg daily if well-tolerated after one month. - Encourage dietary modifications to reduce carbohydrate intake, particularly from juices.

## 2024-06-22 NOTE — Progress Notes (Signed)
 Established Patient Office Visit  Subjective:  Patient ID: Jared [name], male    DOB: [birth year]  Age: 58 y.o. MRN: [id number]  CC:  Chief Complaint  Patient presents with   Hospitalization Follow-up    HPI  Discussed the use of AI scribe software for clinical note transcription with the patient, who gave verbal consent to proceed.  History of Present Illness Chadd Tollison is a 58 year old male with  has a past medical history of Crohn disease (HCC) (2008), Diabetes mellitus type II, and Hypertension. who presents with hand pain and difficulty bending fingers after surgery.  He experiences hand pain and difficulty bending his fingers following a recent surgery for a tendon injury. The surgery was performed by an orthopedic specialist. Post-surgery, he has been undergoing physical and occupational therapy, although he feels the therapy sessions primarily involve measuring his finger mobility rather than active treatment. His fingers, particularly the pinky and first finger, were wrapped in a way that restricted movement, which he found frustrating. He reports that his fingers and the top of his hand swell, especially after performing exercises. He is currently taking ibuprofen  600 mg over-the-counter for pain management. He previously used hydrocodone  and oxycodone  but has since stopped these medications.  He has a history of diabetes, with his blood sugar levels remaining high A1C at 9.2, the same as six months ago. He is on metformin  1000 mg twice daily, taken at 8 AM and 4 PM, and Tradjenta  5 mg daily. He has not yet restarted Jardiance , which was previously paused on the advice of a gastroenterologist. He reports drinking water primarily, with occasional consumption of pineapple mango juice, and maintains a diet that includes broccoli daily.He stays active by working around the house, including painting, although he does not enjoy it.  He experiences abdominal bloating and pain on the  right side, but denies any blood in his stool. He would like to  be prescribed Linzess  for constipation, as other medications have not been effective.  Socially, he smokes a pack of cigarettes a week and has attempted to quit using patches, which made him dizzy. He has Chantix  at home but is hesitant to use it due to potential side effects.    Physical Exam VITALS: BP- 118/65    Attestation   Assessment & Plan       Past Medical History:  Diagnosis Date   Crohn disease (HCC) 2008   with hospital admission in 2011, and 8/12 for flare up and SBO relieved with bowel rest. no suregery, no meds for CD   Diabetes mellitus type II    Hypertension     Past Surgical History:  Procedure Laterality Date   BOWEL RESECTION  01/25/2012   Procedure: SMALL BOWEL RESECTION;  Surgeon: Debby LABOR. 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 Cornett, MD;  Location: [facility] OR;  Service: General;  Laterality: N/A;   SCROTAL SURGERY  ~ 1971   took out a pellet    Family History  Problem Relation Age of Onset   Diabetes Mother    Alopecia Mother    Coronary artery disease Mother    Lung cancer Mother        smoker but had quit   Dementia Father    Diabetes Sister    Hyperlipidemia Sister  Hypertension Sister    Angina Brother    Hepatitis Brother    Alcohol abuse Brother    Diabetes Brother    Colon cancer Paternal Grandmother    Esophageal cancer Neg Hx    Stomach cancer Neg Hx    Rectal cancer Neg Hx     Social History   Socioeconomic History   Marital status: Single    Spouse name: Not on file   Number of children: 0   Years of education: Not on file   Highest education level: Not on file  Occupational History   Not on file  Tobacco Use   Smoking status: Some Days    Current packs/day: 0.25    Average packs/day:  0.3 packs/day for 30.0 years (7.5 ttl pk-yrs)    Types: Cigarettes   Smokeless tobacco: Former    Types: Chew    Quit date: 01/20/1997  Vaping Use   Vaping status: Never Used  Substance and Sexual Activity   Alcohol use: Not Currently   Drug use: No   Sexual activity: Not Currently  Other Topics Concern   Not on file  Social History Narrative   Lives in [place].Is single. Has a sister in [place]. He has a twin brother that passed away from autoimmune hepatitis.  Smokes 2-3 cigarettes a day. Drinks beer once every few months. No history of drug abuse. Studied up to 12th grade. Has orange card. No health insurance. Currently employed cleaning buildings.   Social Drivers of Corporate investment banker Strain: Low Risk  (01/27/2023)   Overall Financial Resource Strain (CARDIA)    Difficulty of Paying Living Expenses: Not hard at all  Food Insecurity: Low Risk  (05/31/2024)   Received from [facility]   Hunger Vital Sign    Within the past 12 months, you worried that your food would run out before you got money to buy more: Never true    Within the past 12 months, the food you bought just didn't last and you didn't have money to get more. : Never true  Transportation Needs: No Transportation Needs (05/31/2024)   Received from [facility]    In the past 12 months, has lack of reliable transportation kept you from medical appointments, meetings, work or from getting things needed for daily living? : No  Physical Activity: Insufficiently Active (01/27/2023)   Exercise Vital Sign    Days of Exercise per Week: 2 days    Minutes of Exercise per Session: 20 min  Stress: No Stress Concern Present (01/27/2023)   [employer] of Occupational Health - Occupational Stress Questionnaire    Feeling of Stress : Not at all  Social Connections: Socially Isolated (01/27/2023)   Social Connection and Isolation Panel    Frequency of Communication with Friends and Family: More  than three times a week    Frequency of Social Gatherings with Friends and Family: More than three times a week    Attends Religious Services: Never    Database administrator or Organizations: No    Attends Banker Meetings: Never    Marital Status: Never married  Intimate Partner Violence: Not At Risk (04/17/2023)   Humiliation, Afraid, Rape, and Kick questionnaire    Fear of Current or Ex-Partner: No    Emotionally Abused: No    Physically Abused: No    Sexually Abused: No    Outpatient Medications Prior to Visit  Medication Sig Dispense Refill   docusate sodium  (  COLACE) 250 MG capsule Take 1 capsule (250 mg total) by mouth daily as needed for constipation. 30 capsule 0   hydrOXYzine  (ATARAX ) 10 MG tablet Take 1 tablet (10 mg total) by mouth 3 (three) times daily as needed. 30 tablet 5   Risankizumab -rzaa (SKYRIZI ) 360 MG/2.4ML SOCT Inject 360 mg into the skin every 8 (eight) weeks. 2.4 mL 6   TRADJENTA  5 MG TABS tablet TAKE 1 TABLET BY MOUTH DAILY 90 tablet 11   atorvastatin  (LIPITOR) 10 MG tablet TAKE 1 TABLET BY MOUTH DAILY 30 tablet 11   lisinopril  (ZESTRIL ) 20 MG tablet TAKE 1 TABLET BY MOUTH DAILY 30 tablet 11   metFORMIN  (GLUCOPHAGE ) 1000 MG tablet TAKE ONE (1) TABLET BY MOUTH TWICE DAILY WITH A MEAL 60 tablet 11   meclizine  (ANTIVERT ) 25 MG tablet Take 1 tablet (25 mg total) by mouth 3 (three) times daily as needed for dizziness. (Patient not taking: Reported on 06/22/2024) 21 tablet 0   meloxicam  (MOBIC ) 7.5 MG tablet Take 1 tablet (7.5 mg total) by mouth 2 (two) times daily as needed for pain. (Patient not taking: Reported on 06/22/2024) 30 tablet 2   nicotine  (NICODERM CQ  - DOSED IN MG/24 HOURS) 14 mg/24hr patch Place 1 patch (14 mg total) onto the skin daily. (Patient not taking: Reported on 06/22/2024) 42 patch 0   tiZANidine  (ZANAFLEX ) 2 MG tablet Take 1 tablet (2 mg total) by mouth 2 (two) times daily as needed for muscle spasms. (Patient not taking: Reported on  06/22/2024) 30 tablet 1   cephALEXin  (KEFLEX ) 500 MG capsule Take 1 capsule (500 mg total) by mouth 2 (two) times daily. (Patient not taking: Reported on 06/22/2024) 20 capsule 0   empagliflozin  (JARDIANCE ) 10 MG TABS tablet Take 1 tablet (10 mg total) by mouth daily before breakfast. (Patient not taking: Reported on 06/22/2024) 90 tablet 0   No facility-administered medications prior to visit.    Allergies  Allergen Reactions   Oxycodone  Itching   Trazodone  And Nefazodone     hallucinations    ROS Review of Systems  Constitutional:  Negative for appetite change, chills, fatigue and fever.  HENT:  Negative for congestion, postnasal drip, rhinorrhea and sneezing.   Respiratory:  Negative for cough, shortness of breath and wheezing.   Cardiovascular:  Negative for chest pain, palpitations and leg swelling.  Gastrointestinal:  Negative for abdominal pain, constipation, nausea and vomiting.  Genitourinary:  Negative for difficulty urinating, dysuria, flank pain and frequency.  Musculoskeletal:  Negative for joint swelling and myalgias.  Skin:  Negative for color change, pallor, rash and wound.  Neurological:  Negative for dizziness, facial asymmetry, weakness, numbness and headaches.  Psychiatric/Behavioral:  Negative for behavioral problems, confusion, self-injury and suicidal ideas.       Objective:    Physical Exam Vitals and nursing note reviewed.  Constitutional:      General: He is not in acute distress.    Appearance: Normal appearance. He is not ill-appearing, toxic-appearing or diaphoretic.  Eyes:     General: No scleral icterus.       Right eye: No discharge.        Left eye: No discharge.     Extraocular Movements: Extraocular movements intact.     Conjunctiva/sclera: Conjunctivae normal.  Cardiovascular:     Rate and Rhythm: Normal rate and regular rhythm.     Pulses: Normal pulses.     Heart sounds: Normal heart sounds. No murmur heard.    No friction rub. No gallop.  Pulmonary:     Effort: Pulmonary effort is normal. No respiratory distress.     Breath sounds: Normal breath sounds. No stridor. No wheezing, rhonchi or rales.  Chest:     Chest wall: No tenderness.  Abdominal:     General: There is no distension.     Palpations: Abdomen is soft.     Tenderness: There is no abdominal tenderness. There is no right CVA tenderness, left CVA tenderness or guarding.  Musculoskeletal:        General: No swelling, tenderness, deformity or signs of injury.     Right lower leg: No edema.     Left lower leg: No edema.     Comments: Left hand brace, limited range of motion of left fingers, skin warm and dry.   Skin:    General: Skin is warm and dry.     Capillary Refill: Capillary refill takes less than 2 seconds.     Coloration: Skin is not jaundiced or pale.     Findings: No bruising, erythema or lesion.  Neurological:     Mental Status: He is alert and oriented to person, place, and time.     Motor: No weakness.     Gait: Gait normal.  Psychiatric:        Mood and Affect: Mood normal.        Behavior: Behavior normal.        Thought Content: Thought content normal.        Judgment: Judgment normal.     BP 118/65   Pulse 88   Wt 214 lb (97.1 kg)   SpO2 97%   BMI 27.48 kg/m  Wt Readings from Last 3 Encounters:  06/22/24 214 lb (97.1 kg)  01/23/24 207 lb (93.9 kg)  01/18/24 215 lb (97.5 kg)    No results found for: TSH Lab Results  Component Value Date   WBC 7.9 11/11/2023   HGB 11.8 (L) 11/11/2023   HCT 38.8 (L) 11/11/2023   MCV 89.6 11/11/2023   PLT 303 11/11/2023   Lab Results  Component Value Date   NA 138 11/11/2023   K 3.9 11/11/2023   CO2 22 11/11/2023   GLUCOSE 205 (H) 11/11/2023   BUN 14 11/11/2023   CREATININE 1.07 11/11/2023   BILITOT 0.5 11/11/2023   ALKPHOS 60 11/11/2023   AST 21 11/11/2023   ALT 23 11/11/2023   PROT 7.1 11/11/2023   ALBUMIN 3.6 11/11/2023   CALCIUM  8.9 11/11/2023   ANIONGAP 8 11/11/2023    EGFR 92 09/20/2023   Lab Results  Component Value Date   CHOL 90 (L) 01/18/2024   Lab Results  Component Value Date   HDL 31 (L) 01/18/2024   Lab Results  Component Value Date   LDLCALC 44 01/18/2024   Lab Results  Component Value Date   TRIG 65 01/18/2024   Lab Results  Component Value Date   CHOLHDL 2.9 01/18/2024   Lab Results  Component Value Date   HGBA1C 9.2 (A) 06/22/2024      Assessment & Plan:   Problem List Items Addressed This Visit       Cardiovascular and Mediastinum   Essential hypertension   Relevant Medications   lisinopril  (ZESTRIL ) 20 MG tablet   atorvastatin  (LIPITOR) 10 MG tablet     Endocrine   Type 2 diabetes mellitus without complication, without long-term current use of insulin  (HCC) - Primary   Lab Results  Component Value Date   HGBA1C 9.2 (A) 06/22/2024  Type 2 diabetes mellitus Poor glycemic control with HbA1c of 9.2%. Current medications include metformin  1000mg  BID  and Tradjenta  5mg  daily . Occasional high-carb juice consumption noted. - Restart Jardiance  10 mg daily. - Increase Jardiance  to 25 mg daily if well-tolerated after one month. - Encourage dietary modifications to reduce carbohydrate intake, particularly from juices.       Relevant Medications   empagliflozin  (JARDIANCE ) 10 MG TABS tablet   lisinopril  (ZESTRIL ) 20 MG tablet   atorvastatin  (LIPITOR) 10 MG tablet   metFORMIN  (GLUCOPHAGE ) 1000 MG tablet   Other Relevant Orders   POCT glycosylated hemoglobin (Hb A1C) (Completed)   CBC   CMP14+EGFR     Other   Crohn's disease (HCC)   Followed by GI On Skyrizi  360 mg every 8 weeks Chronic right-sided abdominal pain and bloating Persistent bloating and abdominal pain with no blood in stool.  Encouraged to maintain close follow-up with GI      Tobacco user    Nicotine  dependence, cigarettes Nicotine  dependence with current use of approximately one pack per week/ week. Previous dizziness with nicotine  patches  and concerns about Chantix  side effects. - Discuss potential use of Chantix , emphasizing the possibility of side effects and the option to discontinue if adverse effects occur. - Encourage smoking cessation due to cardiovascular and pulmonary risks.        Constipation    - Order Linzess  for constipation management. - Encourage hydration and dietary fiber intake, including broccoli.      Relevant Medications   linaclotide  (LINZESS ) 145 MCG CAPS capsule   Pain of left hand   Right hand tendon injury, post-surgical Post-surgical status with difficulty bending fingers and swelling, particularly after exercises. Limited progress with current physical therapy regimen. - Continue occupational  therapy twice a week. - Encourage hand exercises to prevent contractures.      Anemia   Lab Results  Component Value Date   WBC 7.9 11/11/2023   HGB 11.8 (L) 11/11/2023   HCT 38.8 (L) 11/11/2023   MCV 89.6 11/11/2023   PLT 303 11/11/2023   Rechecking labs       Relevant Orders   CBC    Meds ordered this encounter  Medications   empagliflozin  (JARDIANCE ) 10 MG TABS tablet    Sig: Take 1 tablet (10 mg total) by mouth daily before breakfast.    Dispense:  30 tablet    Refill:  1   linaclotide  (LINZESS ) 145 MCG CAPS capsule    Sig: Take 1 capsule (145 mcg total) by mouth daily before breakfast.    Dispense:  30 capsule    Refill:  3   lisinopril  (ZESTRIL ) 20 MG tablet    Sig: Take 1 tablet (20 mg total) by mouth daily.    Dispense:  90 tablet    Refill:  2   atorvastatin  (LIPITOR) 10 MG tablet    Sig: Take 1 tablet (10 mg total) by mouth daily.    Dispense:  90 tablet    Refill:  1   metFORMIN  (GLUCOPHAGE ) 1000 MG tablet    Sig: TAKE ONE (1) TABLET BY MOUTH TWICE DAILY WITH A MEAL    Dispense:  180 tablet    Refill:  1    Follow-up: Return in about 4 weeks (around 07/20/2024) for DM constipation.    Kera Deacon R Hattie Aguinaldo, FNP

## 2024-06-22 NOTE — Assessment & Plan Note (Signed)
 Right hand tendon injury, post-surgical Post-surgical status with difficulty bending fingers and swelling, particularly after exercises. Limited progress with current physical therapy regimen. - Continue occupational  therapy twice a week. - Encourage hand exercises to prevent contractures.

## 2024-06-22 NOTE — Assessment & Plan Note (Signed)
-   Order Linzess  for constipation management. - Encourage hydration and dietary fiber intake, including broccoli.

## 2024-06-22 NOTE — Assessment & Plan Note (Signed)
 Followed by GI On Skyrizi  360 mg every 8 weeks Chronic right-sided abdominal pain and bloating Persistent bloating and abdominal pain with no blood in stool.  Encouraged to maintain close follow-up with GI

## 2024-06-23 ENCOUNTER — Other Ambulatory Visit: Payer: Self-pay

## 2024-06-23 LAB — CMP14+EGFR
ALT: 21 IU/L (ref 0–44)
AST: 14 IU/L (ref 0–40)
Albumin: 4.1 g/dL (ref 3.8–4.9)
Alkaline Phosphatase: 82 IU/L (ref 44–121)
BUN/Creatinine Ratio: 9 (ref 9–20)
BUN: 9 mg/dL (ref 6–24)
Bilirubin Total: 0.4 mg/dL (ref 0.0–1.2)
CO2: 20 mmol/L (ref 20–29)
Calcium: 9 mg/dL (ref 8.7–10.2)
Chloride: 105 mmol/L (ref 96–106)
Creatinine, Ser: 1.03 mg/dL (ref 0.76–1.27)
Globulin, Total: 2.6 g/dL (ref 1.5–4.5)
Glucose: 241 mg/dL — ABNORMAL HIGH (ref 70–99)
Potassium: 4.4 mmol/L (ref 3.5–5.2)
Sodium: 142 mmol/L (ref 134–144)
Total Protein: 6.7 g/dL (ref 6.0–8.5)
eGFR: 84 mL/min/1.73 (ref >59)

## 2024-06-23 LAB — CBC
Hematocrit: 41.7 % (ref 37.5–51.0)
Hemoglobin: 13.1 g/dL (ref 13.0–17.7)
MCH: 28 pg (ref 26.6–33.0)
MCHC: 31.4 g/dL — ABNORMAL LOW (ref 31.5–35.7)
MCV: 89 fL (ref 79–97)
Platelets: 352 x10E3/uL (ref 150–450)
RBC: 4.68 x10E6/uL (ref 4.14–5.80)
RDW: 15.3 % (ref 11.6–15.4)
WBC: 5.9 x10E3/uL (ref 3.4–10.8)

## 2024-06-25 ENCOUNTER — Ambulatory Visit: Payer: Self-pay | Admitting: Nurse Practitioner

## 2024-06-25 ENCOUNTER — Other Ambulatory Visit: Payer: Self-pay

## 2024-06-27 DIAGNOSIS — M79645 Pain in left finger(s): Secondary | ICD-10-CM | POA: Diagnosis not present

## 2024-06-27 DIAGNOSIS — M25642 Stiffness of left hand, not elsewhere classified: Secondary | ICD-10-CM | POA: Diagnosis not present

## 2024-06-27 NOTE — Progress Notes (Signed)
 Occupational Therapy Visit Daily Note  Payor: [employer] MEDICARE ADV / Plan: UHC MA DUAL COMPLETE RPPO-SNP / Product Type: Medicare Advantage /   Visit Count: 4    Rehabilitation Precautions/Restrictions:   Precautions/Restrictions Precautions: wean from splint; chrons disease, previous neck fx Restrictions: ROM to tolerance         Referring Diagnosis: S66.822A,S61.402A (ICD-10-CM) - Extensor tendon laceration of left hand with open wound, initial encounter    SUBJECTIVE Patient Report:   Pt reports that his L LF feels like it is moving better. Pt reports that he is doing his exercises every day  Pain: Pain Assessment Pain Assessment: 0-10 Pain Score  : 3 Pain Location: Hand Pain Orientation: Left Clinical Progression: Gradually improving Pain Intervention(s): Medication (See MAR), Home medication, Rest, Splinting   OBJECTIVE  General Observation/Objective Findings:  Pt arrived to therapy on time with no signs of distress. Pt completed hand hygiene upon arrival and consented to all therapeutic intervention.      Interventions:  Fluidotherapy (Dry Whirlpool): 10 minutes  Temperature: 110 F, air speed: 100 applied to affected hand to increase tissue extensibility, blood circulation, and reduce pain. Pt instructed to perform AROM of the wrist and digits in warmth. No adverse reactions post tx noted.    Modalities/Orthotic Skin Integrity Assessment:   Modalities Performed per interventions above.  Patient was informed of risks and benefits to treatment today.  Skin integrity prior to treatment:   Intact.  Skin integrity after treatment:   Intact.  Therapeutic exercise: 12 minutes  --Pt was educated on using a compression glove. Glove should be snug but not too loose or too tight. The purpose of the compression glove is to promote lymphatic drainage. Pt was advised not to wear the glove 24/7. Pt may wear the glove throughout the day and/or at night, and will take the glove off  periodically for rest periods. Pt was educated on signs and symptoms of decreased circulation associated with compression glove wear, including temperature changes, skin discoloration, and change in sensation. Pt verbalized understanding.   --Pt advised to wear splint at night and gradually wean out during the day   Access Code: [email or web address] URL: [email or web address] Date: 06/27/2024 Prepared by: Gailen Roam  Exercises - Hand PROM MCP Flexion  - 3 x daily - 7 x weekly - 1 sets - 10 reps - 10 hold - Seated Finger PIP Flexion PROM  - 3 x daily - 7 x weekly - 1 sets - 10 reps - 10 hold - Seated Finger DIP Flexion PROM  - 3 x daily - 7 x weekly - 1 sets - 10 reps - 10 hold - Seated Finger Composite Flexion Stretch  - 3 x daily - 7 x weekly - 1 sets - 10 reps - 10 hold - Seated Wrist Flexion PROM  - 3 x daily - 7 x weekly - 1 sets - 10 reps - 10 hold - Seated Wrist Extension PROM  - 3 x daily - 7 x weekly - 1 sets - 10 reps - 10 hold - Seated Wrist Radial Ulnar Deviation PROM  - 3 x daily - 7 x weekly - 1 sets - 10 reps - 10 hold  Therapeutic activity: 10 minutes  --Towel scrunch - patient participated in towel scrunching exercise to address mobility of the affected digits. The patient scrunches the towel by flexing the fingers and then unscrunches the towel by extending the fingers, addressing mobility as well as sensory stimulation with the hand towel textures.  This activity simulates ability to grip and fold laundry.    --Ball roll- pt places affected hand on top of ball and rolls forward and backward to facilitate AAROM in wrist flexion and extension. Pt also rolls ball side to side to facilitate AAROM in forearm pronation and supination. This activity helps improve ROM of the wrist needed for activities such as bathing or performing light household chores (i.e. sweeping)   Manual therapy: 14 minutes  --PROM of the digits at the MP, PIP, DIP, and composite was performed within  patient's pain tolerance to improve flexibility. Pt was instructed to stop ROM when pain initiated. Elbow was positioned in flexion and wrist in neutral during digit ROM. Pt was educated to continue performing PROM exercises as part of HEP.  --PROM of the affected wrist in flexion (5x) and extension (5x) was performed within patient's pain tolerance to improve flexibility. Pt was instructed to stop ROM at initiation of pain. Elbow was positioned in flexion during wrist ROM. Pt was educated to continue performing PROM exercises as part of HEP.        Education:  Yes, as described in interventions    ASSESSMENT Pt tolerated therapeutic intervention well today with no complaints of pain or excessive difficulty. Pt is making functional improvements as evidenced by less pain with active motion. Session focused on active/passive motion and progression of HEP. Pt was educated on performing PROM of the digits/wrist as part of HEP.  Pt was given a demonstration as well as written/verbal directions. Pt experienced sharp pain at end range of motion with digits. Pt reports compliance with HEP which helps to facilitate progress at home and during therapy. Pt will continue to benefit from skilled OT to address decreased AROM, strength, and increased pain limiting participation in ADLs, IADLs, and work tasks.   Therapy Diagnosis:     ICD-10-CM   1. Pain of finger of left hand  M79.645   2. Decreased range of motion of finger of left hand  M25.642      Progress Towards Goals:   Progress toward goals was not addressed today.    Goals Addressed             This Visit's Progress   . OT Goal        Short Term Goals (STG) - Time Frame: 3 visits   STG 1: Patient will demonstrate compliance and understanding to HEP by completing program 2x/daily as measured by increase in overall function. 7/30: progressing      Long Term Goals (LTG) - Time Frame: 22 visits   LTG 1: Pt's pain will decrease from 8/10 to  1/10 to allow for increased participation in ADLs, IADLs, and work/leisure tasks.  7/30: not met    LTG 2: Pt's Quick Dash score will decrease from 45.45 to 10 indicating increased functional use of affected hand and upper extremity with daily ADL/IADLs such as washing back, meal prep, and opening a jar.    7/30: not met, 20.45   LTG 3: Pt. TAM will equal 150 or more in digits 2-5 to facilitate greater ROM needed for daily tasks such as holding a toothbrush, cutting food, and grasping a washcloth.   7/30: progressing     LTG 4: Pt will increase L MP flexion (digit 2-5) AROM to 70 degrees to increase participation in gripping activities such as sweeping and carrying groceries.   7/30: progressing     LTG 5: Pt will increase L PIP flexion (digit 2-5)  AROM to 60 degrees to increase participation in activities such as holding a tooth brush.    7/30: progressing     LTG 6: Pt will increase L wrist extension AROM from 50 to 62 degrees to increase participation in weight bearing, pushing open doors, and cleaning surfaces.   7/30: progressing, 53 deg        PLAN Treatment Frequency and Duration:  Treatment Plan Details: 2x/wk for 20 visits  Recommended OT Treatment/Interventions: Manual therapy (97140); Neuromuscular re-education [phone]); Therapeutic activity (97530); Therapeutic exercise (97110); Selfcare/ADL Training (02464); Ultrasound (02964); Fluidotherapy [phone]); Paraffin (97018); Self-care/home management training (02464)   Recommended Consults:  None currently  Development of Plan of Care:  No change in POC.  Total Treatment Time (Time & Untimed): Total Treatment Minutes: 46 Total Time in Timed Codes: Timed Code Minutes: 36   OT Modalities Whirlpool Therapy minutes: 10 Treatment/Procedure Manual Therapy Tech, 1 + regions minutes: 14 Therapeutic Actvity Direct Pt Contact minutes: 10 $Therapeutic Exercises minutes: 12         The patient has been instructed to contact our  clinic if any questions or problems should arise.

## 2024-06-29 ENCOUNTER — Other Ambulatory Visit: Payer: Self-pay

## 2024-07-02 ENCOUNTER — Telehealth: Payer: Self-pay

## 2024-07-02 NOTE — Telephone Encounter (Signed)
 Copied from CRM #[id number]. Topic: Clinical - Medication Question >> Jul 02, 2024 12:36 PM Winona R wrote: Pt returning call, But state he stopped taking the 100 mg metformin  because it mad him feel sick when taking the Jardiance  along with metformin . Please call the pt and inform him on what he should be doing.   Please advise [facility]

## 2024-07-04 ENCOUNTER — Other Ambulatory Visit: Payer: Self-pay

## 2024-07-04 DIAGNOSIS — M25642 Stiffness of left hand, not elsewhere classified: Secondary | ICD-10-CM | POA: Diagnosis not present

## 2024-07-04 DIAGNOSIS — M79645 Pain in left finger(s): Secondary | ICD-10-CM | POA: Diagnosis not present

## 2024-07-04 DIAGNOSIS — R29898 Other symptoms and signs involving the musculoskeletal system: Secondary | ICD-10-CM | POA: Diagnosis not present

## 2024-07-09 DIAGNOSIS — M79645 Pain in left finger(s): Secondary | ICD-10-CM | POA: Diagnosis not present

## 2024-07-09 DIAGNOSIS — M25642 Stiffness of left hand, not elsewhere classified: Secondary | ICD-10-CM | POA: Diagnosis not present

## 2024-07-09 DIAGNOSIS — R29898 Other symptoms and signs involving the musculoskeletal system: Secondary | ICD-10-CM | POA: Diagnosis not present

## 2024-07-11 DIAGNOSIS — M79645 Pain in left finger(s): Secondary | ICD-10-CM | POA: Diagnosis not present

## 2024-07-11 DIAGNOSIS — M25642 Stiffness of left hand, not elsewhere classified: Secondary | ICD-10-CM | POA: Diagnosis not present

## 2024-07-17 ENCOUNTER — Other Ambulatory Visit: Payer: Self-pay | Admitting: Nurse Practitioner

## 2024-07-17 DIAGNOSIS — R29898 Other symptoms and signs involving the musculoskeletal system: Secondary | ICD-10-CM | POA: Diagnosis not present

## 2024-07-17 DIAGNOSIS — S61402D Unspecified open wound of left hand, subsequent encounter: Secondary | ICD-10-CM | POA: Diagnosis not present

## 2024-07-17 DIAGNOSIS — E119 Type 2 diabetes mellitus without complications: Secondary | ICD-10-CM

## 2024-07-17 DIAGNOSIS — M25642 Stiffness of left hand, not elsewhere classified: Secondary | ICD-10-CM | POA: Diagnosis not present

## 2024-07-17 DIAGNOSIS — M79645 Pain in left finger(s): Secondary | ICD-10-CM | POA: Diagnosis not present

## 2024-07-17 DIAGNOSIS — S66822D Laceration of other specified muscles, fascia and tendons at wrist and hand level, left hand, subsequent encounter: Secondary | ICD-10-CM | POA: Diagnosis not present

## 2024-07-20 ENCOUNTER — Other Ambulatory Visit: Payer: Self-pay | Admitting: Nurse Practitioner

## 2024-07-20 DIAGNOSIS — E119 Type 2 diabetes mellitus without complications: Secondary | ICD-10-CM

## 2024-07-23 ENCOUNTER — Other Ambulatory Visit: Payer: Self-pay

## 2024-07-23 ENCOUNTER — Other Ambulatory Visit: Payer: Self-pay | Admitting: Nurse Practitioner

## 2024-07-23 DIAGNOSIS — E119 Type 2 diabetes mellitus without complications: Secondary | ICD-10-CM

## 2024-07-23 MED ORDER — GLIPIZIDE ER 5 MG PO TB24
5.0000 mg | ORAL_TABLET | Freq: Every day | ORAL | 1 refills | Status: DC
  Filled 2024-07-23: qty 60, 60d supply, fill #0

## 2024-07-24 DIAGNOSIS — M79645 Pain in left finger(s): Secondary | ICD-10-CM | POA: Diagnosis not present

## 2024-07-24 DIAGNOSIS — R29898 Other symptoms and signs involving the musculoskeletal system: Secondary | ICD-10-CM | POA: Diagnosis not present

## 2024-07-24 DIAGNOSIS — M25642 Stiffness of left hand, not elsewhere classified: Secondary | ICD-10-CM | POA: Diagnosis not present

## 2024-07-27 ENCOUNTER — Other Ambulatory Visit: Payer: Self-pay

## 2024-07-31 ENCOUNTER — Ambulatory Visit (INDEPENDENT_AMBULATORY_CARE_PROVIDER_SITE_OTHER): Admitting: Nurse Practitioner

## 2024-07-31 ENCOUNTER — Encounter: Payer: Self-pay | Admitting: Nurse Practitioner

## 2024-07-31 ENCOUNTER — Other Ambulatory Visit: Payer: Self-pay

## 2024-07-31 VITALS — BP 129/67 | HR 81 | Temp 96.8°F | Wt 213.0 lb

## 2024-07-31 DIAGNOSIS — M25512 Pain in left shoulder: Secondary | ICD-10-CM | POA: Diagnosis not present

## 2024-07-31 DIAGNOSIS — M25511 Pain in right shoulder: Secondary | ICD-10-CM | POA: Diagnosis not present

## 2024-07-31 DIAGNOSIS — E119 Type 2 diabetes mellitus without complications: Secondary | ICD-10-CM

## 2024-07-31 DIAGNOSIS — G8929 Other chronic pain: Secondary | ICD-10-CM | POA: Diagnosis not present

## 2024-07-31 DIAGNOSIS — I10 Essential (primary) hypertension: Secondary | ICD-10-CM | POA: Diagnosis not present

## 2024-07-31 DIAGNOSIS — M79642 Pain in left hand: Secondary | ICD-10-CM

## 2024-07-31 DIAGNOSIS — Z720 Tobacco use: Secondary | ICD-10-CM | POA: Diagnosis not present

## 2024-07-31 MED ORDER — IBUPROFEN 800 MG PO TABS
800.0000 mg | ORAL_TABLET | Freq: Three times a day (TID) | ORAL | 0 refills | Status: DC | PRN
  Filled 2024-07-31: qty 30, 10d supply, fill #0

## 2024-07-31 NOTE — Progress Notes (Signed)
 Established Patient Office Visit  Subjective:  Patient ID: Jared [name], male    DOB: 1966-11-10  Age: 58 y.o. MRN: [id number]  CC:  Chief Complaint  Patient presents with   Shoulder Pain    Both scale of 9    HPI   Discussed the use of AI scribe software for clinical note transcription with the patient, who gave verbal consent to proceed.  History of Present Illness Jared [name] is a 57 year old male  has a past medical history of Crohn disease (HCC) (2008), Diabetes mellitus type II, and Hypertension. who presents for follow-up on his diabetes management.  He is experiencing issues with his diabetes medication, specifically Jardiance , which caused lightheadedness and dizziness. He took it for three days before discontinuing due to these side effects. He has since resumed taking glipizide , which he believes is 10 mg, although it was prescribed as 5 mg. He has been on glipizide  for about two weeks. He also continues to take metformin  1000 mg twice daily and Tradjenta  5 mg daily. His last A1c was 9.2, and he has been monitoring his blood sugars, although he cannot recall specific numbers.  He reports pain in his shoulders, which he attributes to an old neck injury. He has been using ibuprofen  for pain management, taking it as needed, typically at night to aid sleep. He prefers a pain shot but is willing to take ibuprofen  pills. He also mentions using tizanidine , a muscle relaxer, but finds it ineffective.  He is currently undergoing occupational therapy for a finger injury and plans to continue until November. He has a history of smoking but is attempting to quit, currently smoking a pack every two weeks. 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  He has been managing his diet by avoiding sugary drinks and consuming salads, although he is cautious about sugar content in foods. He remains physically active through his work and  home activities, such as painting his house.  No fever, chills, chest pain, shortness of breath, abdominal pain, nausea, or vomiting.   Assessment & Plan     Past Medical History:  Diagnosis Date   Crohn disease (HCC) 2008   with hospital admission in 2011, and 8/12 for flare up and SBO relieved with bowel rest. no suregery, no meds for CD   Diabetes mellitus type II    Hypertension     Past Surgical History:  Procedure Laterality Date   BOWEL RESECTION  01/25/2012   Procedure: SMALL BOWEL RESECTION;  Surgeon: Debby LABOR. 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 Cornett, MD;  Location: [facility] OR;  Service: General;  Laterality: N/A;   SCROTAL SURGERY  ~ 1971   took out a pellet    Family History  Problem Relation Age of Onset   Diabetes Mother    Alopecia Mother    Coronary artery disease Mother    Lung cancer Mother        smoker but had quit   Dementia Father    Diabetes Sister    Hyperlipidemia Sister    Hypertension Sister    Angina Brother    Hepatitis Brother    Alcohol abuse Brother    Diabetes Brother    Colon cancer Paternal Grandmother  Esophageal cancer Neg Hx    Stomach cancer Neg Hx    Rectal cancer Neg Hx     Social History   Socioeconomic History   Marital status: Single    Spouse name: Not on file   Number of children: 0   Years of education: Not on file   Highest education level: Not on file  Occupational History   Not on file  Tobacco Use   Smoking status: Some Days    Current packs/day: 0.25    Average packs/day: 0.3 packs/day for 30.0 years (7.5 ttl pk-yrs)    Types: Cigarettes   Smokeless tobacco: Former    Types: Chew    Quit date: 01/20/1997  Vaping Use   Vaping status: Never Used  Substance and Sexual Activity   Alcohol use: Not Currently   Drug use: No   Sexual  activity: Not Currently  Other Topics Concern   Not on file  Social History Narrative   Lives in [place].Is single. Has a sister in [place]. He has a twin brother that passed away from autoimmune hepatitis.  Smokes 2-3 cigarettes a day. Drinks beer once every few months. No history of drug abuse. Studied up to 12th grade. Has orange card. No health insurance. Currently employed cleaning buildings.   Social Drivers of Corporate investment banker Strain: Low Risk  (01/27/2023)   Overall Financial Resource Strain (CARDIA)    Difficulty of Paying Living Expenses: Not hard at all  Food Insecurity: Low Risk  (05/31/2024)   Received from [facility]   Hunger Vital Sign    Within the past 12 months, you worried that your food would run out before you got money to buy more: Never true    Within the past 12 months, the food you bought just didn't last and you didn't have money to get more. : Never true  Transportation Needs: No Transportation Needs (05/31/2024)   Received from [facility]    In the past 12 months, has lack of reliable transportation kept you from medical appointments, meetings, work or from getting things needed for daily living? : No  Physical Activity: Insufficiently Active (01/27/2023)   Exercise Vital Sign    Days of Exercise per Week: 2 days    Minutes of Exercise per Session: 20 min  Stress: No Stress Concern Present (01/27/2023)   [employer] of Occupational Health - Occupational Stress Questionnaire    Feeling of Stress : Not at all  Social Connections: Socially Isolated (01/27/2023)   Social Connection and Isolation Panel    Frequency of Communication with Friends and Family: More than three times a week    Frequency of Social Gatherings with Friends and Family: More than three times a week    Attends Religious Services: Never    Database administrator or Organizations: No    Attends Banker Meetings: Never    Marital Status:  Never married  Intimate Partner Violence: Not At Risk (04/17/2023)   Humiliation, Afraid, Rape, and Kick questionnaire    Fear of Current or Ex-Partner: No    Emotionally Abused: No    Physically Abused: No    Sexually Abused: No    Outpatient Medications Prior to Visit  Medication Sig Dispense Refill   atorvastatin  (LIPITOR) 10 MG tablet Take 1 tablet (10 mg total) by mouth daily. 90 tablet 1   docusate sodium  (COLACE) 250 MG capsule Take 1 capsule (250 mg total) by mouth daily as  needed for constipation. 30 capsule 0   glipiZIDE  (GLUCOTROL  XL) 5 MG 24 hr tablet Take 1 tablet (5 mg total) by mouth daily with breakfast. 60 tablet 1   hydrOXYzine  (ATARAX ) 10 MG tablet Take 1 tablet (10 mg total) by mouth 3 (three) times daily as needed. 30 tablet 5   linaclotide  (LINZESS ) 145 MCG CAPS capsule Take 1 capsule (145 mcg total) by mouth daily before breakfast. 30 capsule 3   lisinopril  (ZESTRIL ) 20 MG tablet Take 1 tablet (20 mg total) by mouth daily. 90 tablet 2   metFORMIN  (GLUCOPHAGE ) 1000 MG tablet Take 1 tablet (1,000 mg total) by mouth 2 (two) times daily with a meal. 180 tablet 1   Risankizumab -rzaa (SKYRIZI ) 360 MG/2.4ML SOCT Inject 360 mg into the skin every 8 (eight) weeks. 2.4 mL 6   TRADJENTA  5 MG TABS tablet TAKE 1 TABLET BY MOUTH DAILY 90 tablet 11   meclizine  (ANTIVERT ) 25 MG tablet Take 1 tablet (25 mg total) by mouth 3 (three) times daily as needed for dizziness. (Patient not taking: Reported on 07/31/2024) 21 tablet 0   nicotine  (NICODERM CQ  - DOSED IN MG/24 HOURS) 14 mg/24hr patch Place 1 patch (14 mg total) onto the skin daily. (Patient not taking: Reported on 07/31/2024) 42 patch 0   empagliflozin  (JARDIANCE ) 10 MG TABS tablet Take 1 tablet (10 mg total) by mouth daily before breakfast. (Patient not taking: Reported on 07/31/2024) 30 tablet 1   meloxicam  (MOBIC ) 7.5 MG tablet Take 1 tablet (7.5 mg total) by mouth 2 (two) times daily as needed for pain. (Patient not taking: Reported  on 07/31/2024) 30 tablet 2   tiZANidine  (ZANAFLEX ) 2 MG tablet Take 1 tablet (2 mg total) by mouth 2 (two) times daily as needed for muscle spasms. (Patient not taking: Reported on 07/31/2024) 30 tablet 1   No facility-administered medications prior to visit.    Allergies  Allergen Reactions   Oxycodone  Itching   Trazodone  And Nefazodone     hallucinations    ROS Review of Systems  Constitutional:  Negative for appetite change, chills, fatigue and fever.  HENT:  Negative for congestion, postnasal drip, rhinorrhea and sneezing.   Respiratory:  Negative for cough, shortness of breath and wheezing.   Cardiovascular:  Negative for chest pain, palpitations and leg swelling.  Gastrointestinal:  Negative for abdominal pain, nausea and vomiting.  Genitourinary:  Negative for difficulty urinating, dysuria, flank pain and frequency.  Musculoskeletal:  Positive for arthralgias. Negative for joint swelling and myalgias.  Skin:  Negative for color change, pallor, rash and wound.  Neurological:  Negative for dizziness, facial asymmetry, weakness, numbness and headaches.  Psychiatric/Behavioral:  Negative for behavioral problems, confusion, self-injury and suicidal ideas.       Objective:    Physical Exam Vitals and nursing note reviewed.  Constitutional:      General: He is not in acute distress.    Appearance: Normal appearance. He is not ill-appearing, toxic-appearing or diaphoretic.  Eyes:     General: No scleral icterus.       Right eye: No discharge.        Left eye: No discharge.     Extraocular Movements: Extraocular movements intact.     Conjunctiva/sclera: Conjunctivae normal.  Cardiovascular:     Rate and Rhythm: Normal rate and regular rhythm.     Pulses: Normal pulses.     Heart sounds: Normal heart sounds. No murmur heard.    No friction rub. No gallop.  Pulmonary:  Effort: Pulmonary effort is normal. No respiratory distress.     Breath sounds: Normal breath sounds. No  stridor. No wheezing, rhonchi or rales.  Chest:     Chest wall: No tenderness.  Abdominal:     General: There is no distension.     Palpations: Abdomen is soft.     Tenderness: There is no abdominal tenderness. There is no right CVA tenderness, left CVA tenderness or guarding.  Musculoskeletal:        General: Tenderness present. No swelling, deformity or signs of injury.     Right lower leg: No edema.     Left lower leg: No edema.     Comments: Tenderness on range of motion of bilateral shoulders, no swelling or redness noted  Skin:    General: Skin is warm and dry.     Capillary Refill: Capillary refill takes less than 2 seconds.     Coloration: Skin is not jaundiced or pale.     Findings: No bruising, erythema or lesion.  Neurological:     Mental Status: He is alert and oriented to person, place, and time.     Motor: No weakness.     Coordination: Coordination normal.     Gait: Gait normal.  Psychiatric:        Mood and Affect: Mood normal.        Behavior: Behavior normal.        Thought Content: Thought content normal.        Judgment: Judgment normal.     BP 129/67   Pulse 81   Temp (!) 96.8 F (36 C)   Wt 213 lb (96.6 kg)   SpO2 100%   BMI 27.35 kg/m  Wt Readings from Last 3 Encounters:  07/31/24 213 lb (96.6 kg)  06/22/24 214 lb (97.1 kg)  01/23/24 207 lb (93.9 kg)    No results found for: TSH Lab Results  Component Value Date   WBC 5.9 06/22/2024   HGB 13.1 06/22/2024   HCT 41.7 06/22/2024   MCV 89 06/22/2024   PLT 352 06/22/2024   Lab Results  Component Value Date   NA 142 06/22/2024   K 4.4 06/22/2024   CO2 20 06/22/2024   GLUCOSE 241 (H) 06/22/2024   BUN 9 06/22/2024   CREATININE 1.03 06/22/2024   BILITOT 0.4 06/22/2024   ALKPHOS 82 06/22/2024   AST 14 06/22/2024   ALT 21 06/22/2024   PROT 6.7 06/22/2024   ALBUMIN 4.1 06/22/2024   CALCIUM  9.0 06/22/2024   ANIONGAP 8 11/11/2023   EGFR 84 06/22/2024   Lab Results  Component Value  Date   CHOL 90 (L) 01/18/2024   Lab Results  Component Value Date   HDL 31 (L) 01/18/2024   Lab Results  Component Value Date   LDLCALC 44 01/18/2024   Lab Results  Component Value Date   TRIG 65 01/18/2024   Lab Results  Component Value Date   CHOLHDL 2.9 01/18/2024   Lab Results  Component Value Date   HGBA1C 9.2 (A) 06/22/2024      Assessment & Plan:   Problem List Items Addressed This Visit       Cardiovascular and Mediastinum   Essential hypertension   BP Readings from Last 3 Encounters:  07/31/24 129/67  06/22/24 118/65  04/24/24 137/79   Hypertension well-controlled on current medication regimen. - Continue lisinopril  20 mg daily.         Endocrine   Type 2 diabetes mellitus without  complication, without long-term current use of insulin  (HCC)   Lab Results  Component Value Date   HGBA1C 9.2 (A) 06/22/2024    Type 2 diabetes with recent medication adjustment. Discontinued Jardiance  due to side effects. A1c at 9.2 indicates poor control. Discussed blood sugar monitoring and dietary habits. Encouraged target blood sugar levels: fasting 80-120 mg/dL, postprandial ~829 mg/dL. - Continue glipizide  5 mg daily. - Continue metformin  1000 mg twice daily. - Continue Tradjenta  5 mg daily. - Monitor blood sugar levels regularly.  CBG goals discussed - Recheck A1c in two months. - Encourage dietary modifications, avoiding sugary foods and drinks. - Encourage regular physical activity.         Other   Tobacco user     Nicotine  dependence, cigarettes Nicotine  dependence with reduced smoking frequency. Actively trying to quit. - Encourage smoking cessation.        Pain of left hand    Hand pain and difficulty bending fingers due to previous injury. Undergoing occupational therapy until November. - Continue occupational therapy for hand until November.      Chronic pain of both shoulders - Primary    Chronic bilateral shoulder pain since neck  injury. Managed with ibuprofen . Meloxicam  and tizanidine  were ineffective, medications discontinued - Prescribe ibuprofen  800 mg 3 times daily as needed, take with food, as needed, especially at night.      Relevant Medications   ibuprofen  (ADVIL ) 800 MG tablet    Meds ordered this encounter  Medications   ibuprofen  (ADVIL ) 800 MG tablet    Sig: Take 1 tablet (800 mg total) by mouth every 8 (eight) hours as needed.    Dispense:  30 tablet    Refill:  0    Follow-up: Return in about 2 months (around 09/30/2024) for DM.    Leigh Kaeding R Bari Leib, FNP

## 2024-07-31 NOTE — Assessment & Plan Note (Signed)
  Chronic bilateral shoulder pain since neck injury. Managed with ibuprofen . Meloxicam  and tizanidine  were ineffective, medications discontinued - Prescribe ibuprofen  800 mg 3 times daily as needed, take with food, as needed, especially at night.

## 2024-07-31 NOTE — Assessment & Plan Note (Signed)
  Hand pain and difficulty bending fingers due to previous injury. Undergoing occupational therapy until November. - Continue occupational therapy for hand until November.

## 2024-07-31 NOTE — Assessment & Plan Note (Signed)
   Nicotine  dependence, cigarettes Nicotine  dependence with reduced smoking frequency. Actively trying to quit. - Encourage smoking cessation.

## 2024-07-31 NOTE — Assessment & Plan Note (Signed)
 Lab Results  Component Value Date   HGBA1C 9.2 (A) 06/22/2024    Type 2 diabetes with recent medication adjustment. Discontinued Jardiance  due to side effects. A1c at 9.2 indicates poor control. Discussed blood sugar monitoring and dietary habits. Encouraged target blood sugar levels: fasting 80-120 mg/dL, postprandial ~829 mg/dL. - Continue glipizide  5 mg daily. - Continue metformin  1000 mg twice daily. - Continue Tradjenta  5 mg daily. - Monitor blood sugar levels regularly.  CBG goals discussed - Recheck A1c in two months. - Encourage dietary modifications, avoiding sugary foods and drinks. - Encourage regular physical activity.

## 2024-07-31 NOTE — Patient Instructions (Signed)
 1. Chronic pain of both shoulders (Primary)  - ibuprofen  (ADVIL ) 800 MG tablet; Take 1 tablet (800 mg total) by mouth every 8 (eight) hours as needed.  Dispense: 30 tablet; Refill: 0   Goal for fasting blood sugar ranges from 80 to 120 and 2 hours after any meal or at bedtime should be between 130 to 170.      It is important that you exercise regularly at least 30 minutes 5 times a week as tolerated  Think about what you will eat, plan ahead. Choose  clean, green, fresh or frozen over canned, processed or packaged foods which are more sugary, salty and fatty. 70 to 75% of food eaten should be vegetables and fruit. Three meals at set times with snacks allowed between meals, but they must be fruit or vegetables. Aim to eat over a 12 hour period , example 7 am to 7 pm, and STOP after  your last meal of the day. Drink water,generally about 64 ounces per day, no other drink is as healthy. Fruit juice is best enjoyed in a healthy way, by EATING the fruit.  Thanks for choosing [facility] we consider it a privelige to serve you.

## 2024-07-31 NOTE — Assessment & Plan Note (Signed)
 BP Readings from Last 3 Encounters:  07/31/24 129/67  06/22/24 118/65  04/24/24 137/79   Hypertension well-controlled on current medication regimen. - Continue lisinopril  20 mg daily.

## 2024-08-01 ENCOUNTER — Other Ambulatory Visit: Payer: Self-pay

## 2024-08-02 DIAGNOSIS — R29898 Other symptoms and signs involving the musculoskeletal system: Secondary | ICD-10-CM | POA: Diagnosis not present

## 2024-08-02 DIAGNOSIS — M25642 Stiffness of left hand, not elsewhere classified: Secondary | ICD-10-CM | POA: Diagnosis not present

## 2024-08-02 DIAGNOSIS — M79645 Pain in left finger(s): Secondary | ICD-10-CM | POA: Diagnosis not present

## 2024-08-03 ENCOUNTER — Ambulatory Visit: Payer: Self-pay | Admitting: Nurse Practitioner

## 2024-08-07 NOTE — Progress Notes (Signed)
 [facility] - Clinic Note  08/21/2024   CHIEF COMPLAINT Patient presents for Retina Follow Up  HISTORY OF PRESENT ILLNESS: Nature Jared [name] is a 58 y.o. male who presents to the clinic today for:  HPI     Retina Follow Up   Patient presents with  Diabetic Retinopathy.  In both eyes.  This started 10 months ago.  Duration of 10 months.  Since onset it is stable.  I, the attending physician,  performed the HPI with the patient and updated documentation appropriately.        Comments   10 month retina follow up NPDR OU pt is reporting no vision changes noticed he denies flashes or floaters pt last reading 145 and A1C was 9.2      Last edited by Valdemar Rogue, MD on 08/26/2024  2:27 AM.    Pt states he was put on Jardiance  after his last A1c reading of 9.2. Pt became ill and stopped taking the jardiance . They upped his Metformin  to 1000mg  daily, still on glipizide . Does not have f/u scheduled w/ Dr. Vivian.   Referring physician: Frazier, [place], OD [address] [place],  [place] [address]  HISTORICAL INFORMATION:  Selected notes from the MEDICAL RECORD NUMBER Referred by Dr. Vivian for baseline retinal eval -- +DME LEE:  Ocular Hx- PMH-   CURRENT MEDICATIONS: No current outpatient medications on file. (Ophthalmic Drugs)   No current facility-administered medications for this visit. (Ophthalmic Drugs)   Current Outpatient Medications (Other)  Medication Sig   atorvastatin  (LIPITOR) 10 MG tablet Take 1 tablet (10 mg total) by mouth daily.   docusate sodium  (COLACE) 250 MG capsule Take 1 capsule (250 mg total) by mouth daily as needed for constipation.   glipiZIDE  (GLUCOTROL  XL) 5 MG 24 hr tablet Take 1 tablet (5 mg total) by mouth daily with breakfast.   hydrOXYzine  (ATARAX ) 10 MG tablet Take 1 tablet (10 mg total) by mouth 3 (three) times daily as needed.   ibuprofen  (ADVIL ) 800 MG tablet Take 1 tablet (800 mg total) by mouth every 8 (eight) hours  as needed.   linaclotide  (LINZESS ) 145 MCG CAPS capsule Take 1 capsule (145 mcg total) by mouth daily before breakfast.   lisinopril  (ZESTRIL ) 20 MG tablet Take 1 tablet (20 mg total) by mouth daily.   meclizine  (ANTIVERT ) 25 MG tablet Take 1 tablet (25 mg total) by mouth 3 (three) times daily as needed for dizziness. (Patient not taking: Reported on 07/31/2024)   metFORMIN  (GLUCOPHAGE ) 1000 MG tablet Take 1 tablet (1,000 mg total) by mouth 2 (two) times daily with a meal.   nicotine  (NICODERM CQ  - DOSED IN MG/24 HOURS) 14 mg/24hr patch Place 1 patch (14 mg total) onto the skin daily. (Patient not taking: Reported on 07/31/2024)   Risankizumab -rzaa (SKYRIZI ) 360 MG/2.4ML SOCT Inject 360 mg into the skin every 8 (eight) weeks.   TRADJENTA  5 MG TABS tablet TAKE 1 TABLET BY MOUTH DAILY   No current facility-administered medications for this visit. (Other)   REVIEW OF SYSTEMS: ROS   Positive for: Gastrointestinal, Endocrine Last edited by Resa Delon ORN, COT on 08/21/2024  8:57 AM.       ALLERGIES Allergies  Allergen Reactions   Oxycodone  Itching   Trazodone  And Nefazodone     hallucinations   PAST MEDICAL HISTORY Past Medical History:  Diagnosis Date   Crohn disease (HCC) 2008   with hospital admission in 2011, and 8/12 for flare up and SBO  relieved with bowel rest. no suregery, no meds for CD   Diabetes mellitus type II    Hypertension    Past Surgical History:  Procedure Laterality Date   BOWEL RESECTION  01/25/2012   Procedure: SMALL BOWEL RESECTION;  Surgeon: Debby LABOR. 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 Cornett, MD;  Location: [facility] OR;  Service: General;  Laterality: N/A;   SCROTAL SURGERY  ~ 1971   took out a pellet   FAMILY HISTORY Family History  Problem Relation Age of Onset   Diabetes Mother     Alopecia Mother    Coronary artery disease Mother    Lung cancer Mother        smoker but had quit   Dementia Father    Diabetes Sister    Hyperlipidemia Sister    Hypertension Sister    Angina Brother    Hepatitis Brother    Alcohol abuse Brother    Diabetes Brother    Colon cancer Paternal Grandmother    Esophageal cancer Neg Hx    Stomach cancer Neg Hx    Rectal cancer Neg Hx    SOCIAL HISTORY Social History   Tobacco Use   Smoking status: Some Days    Current packs/day: 0.25    Average packs/day: 0.3 packs/day for 30.0 years (7.5 ttl pk-yrs)    Types: Cigarettes   Smokeless tobacco: Former    Types: Chew    Quit date: 01/20/1997  Vaping Use   Vaping status: Never Used  Substance Use Topics   Alcohol use: Not Currently   Drug use: No       OPHTHALMIC EXAM:  Base Eye Exam     Visual Acuity (Snellen - Linear)       Right Left   Dist [place] 20/40 20/30 -2   Dist ph [place] 20/25 20/25 -1         Tonometry (Tonopen, 9:05 AM)       Right Left   Pressure 16 18         Pupils       Pupils Dark Light Shape React APD   Right PERRL 4 3 Round Brisk None   Left PERRL 4 3 Round Brisk None         Visual Fields       Left Right    Full Full         Extraocular Movement       Right Left    Full, Ortho Full, Ortho         Neuro/Psych     Oriented x3: Yes   Mood/Affect: Normal         Dilation     Both eyes: 2.5% Phenylephrine @ 9:05 AM           Slit Lamp and Fundus Exam     Slit Lamp Exam       Right Left   Lids/Lashes Dermatochalasis Dermatochalasis   Conjunctiva/Sclera mild melanosis mild melanosis   Cornea arcus, trace PEE mild arcus, trace Punctate epithelial erosions   Anterior Chamber deep, clear, narrow angles deep, clear, narrow angles   Iris Round and dilated, No NVI Round and dilated, No NVI   Lens 2-3+ Nuclear sclerosis, 2-3+ Cortical cataract 2-3+ Nuclear sclerosis, 2-3+ Cortical cataract   Anterior Vitreous mild  syneresis mild syneresis         Fundus Exam       Right Left   Disc Pink and Sharp Pink and Sharp   C/D Ratio 0.6 0.3   Macula Flat, Good foveal reflex, punctate MA temporal mac, +cystic changes temporal fovea-- stably improved Flat, Good foveal reflex, No heme or edema   Vessels attenuated, mild tortuosity attenuated, mild tortuosity, mild AV crossing changes   Periphery Attached, rare MA Attached, rare MA           IMAGING AND PROCEDURES  Imaging and Procedures for 08/21/2024  OCT, Retina - OU - Both Eyes       Right Eye Quality was good. Central Foveal Thickness: 276. Progression has been stable. Findings include normal foveal contour, no IRF, no SRF, intraretinal hyper-reflective material, vitreomacular adhesion (Focal cyst temporal fovea-- stably improved).   Left Eye Quality was good. Central Foveal Thickness: 270. Progression has been stable. Findings include normal foveal contour, no IRF, no SRF, vitreomacular adhesion .   Notes *Images captured and stored on drive  Diagnosis / Impression:  NFP, no IRF/SRF OU OD: focal cyst temporal fovea--stably improved  Clinical management:  See below  Abbreviations: NFP - Normal foveal profile. CME - cystoid macular edema. PED - pigment epithelial detachment. IRF - intraretinal fluid. SRF - subretinal fluid. EZ - ellipsoid zone. ERM - epiretinal membrane. ORA - outer retinal atrophy. ORT - outer retinal tubulation. SRHM - subretinal hyper-reflective material. IRHM - intraretinal hyper-reflective material 272           ASSESSMENT/PLAN:   ICD-10-CM   1. 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 Long term (current) use of oral hypoglycemic drugs  Z79.84     3. Essential hypertension  I10     4. Hypertensive retinopathy of both eyes  H35.033     5. Combined forms of age-related  cataract of both eyes  H25.813       1,2. Mild Non-proliferative diabetic retinopathy, both eyes  - A1c: 9.2 (08.08.25), 7.5 (11.05.24), 8.3 in June 2024 - The incidence, risk factors for progression, natural history and treatment options for diabetic retinopathy were discussed with patient.   - The need for close monitoring of blood glucose, blood pressure, and serum lipids, avoiding cigarette or any type of tobacco, and the need for long term follow up was also discussed with patient. - exam shows rare MA, +cystic changes temporal fovea OD--stably improved - FA (01.07.25) shows NL:Drjuuzmzi MA with late leakage; no NV OU - OCT shows focal cyst temporal fovea / diabetic macular edema, right eye --stably improved - f/u in 9-12 months -- DFE/OCT  3,4. Hypertensive retinopathy OU - discussed importance of tight BP control - monitor  5. Mixed Cataract OU - The symptoms of cataract, surgical options, and treatments and risks were discussed with patient. - discussed diagnosis and progression-recommend scheduling f/u @ [facility] (pt of Dr. Vivian) for cataract eval in the next 6 months/Spring 2026. Letter will be sent.   - monitor  Ophthalmic Meds Ordered this visit:  No orders of the defined types were placed in this encounter.    Return for 9-12 months NPDR OU, DFE, OCT.  There are no Patient Instructions on file for this visit.   This document serves as a record of services personally performed by Redell MATSU.  Valdemar, MD, PhD. It was created on their behalf by Wanda GEANNIE Keens, COT an ophthalmic technician. The creation of this record is the provider's dictation and/or activities during the visit.    Electronically signed by:  Wanda GEANNIE Keens, COT  08/26/24 2:28 AM  This document serves as a record of services personally performed by Redell JUDITHANN Valdemar, MD, PhD. It was created on their behalf by Almetta Pesa, an ophthalmic technician. The creation of this record is the  provider's dictation and/or activities during the visit.    Electronically signed by: Almetta Pesa, OA, 08/26/24  2:28 AM  Redell JUDITHANN Valdemar, M.D., Ph.D. Diseases & Surgery of the Retina and Vitreous [facility]  I have reviewed the above documentation for accuracy and completeness, and I agree with the above. Redell JUDITHANN Valdemar, M.D., Ph.D. 08/26/24 2:28 AM    Abbreviations: M myopia (nearsighted); A astigmatism; H hyperopia (farsighted); P presbyopia; Mrx spectacle prescription;  CTL contact lenses; OD right eye; OS left eye; OU both eyes  XT exotropia; ET esotropia; PEK punctate epithelial keratitis; PEE punctate epithelial erosions; DES dry eye syndrome; MGD meibomian gland dysfunction; ATs artificial tears; PFAT's preservative free artificial tears; NSC nuclear sclerotic cataract; PSC posterior subcapsular cataract; ERM epi-retinal membrane; PVD posterior vitreous detachment; RD retinal detachment; DM diabetes mellitus; DR diabetic retinopathy; NPDR non-proliferative diabetic retinopathy; PDR proliferative diabetic retinopathy; CSME clinically significant macular edema; DME diabetic macular edema; dbh dot blot hemorrhages; CWS cotton wool spot; POAG primary open angle glaucoma; C/D cup-to-disc ratio; HVF humphrey visual field; GVF goldmann visual field; OCT optical coherence tomography; IOP intraocular pressure; BRVO Branch retinal vein occlusion; CRVO central retinal vein occlusion; CRAO central retinal artery occlusion; BRAO branch retinal artery occlusion; RT retinal tear; SB scleral buckle; PPV pars plana vitrectomy; VH Vitreous hemorrhage; PRP panretinal laser photocoagulation; IVK intravitreal kenalog ; VMT vitreomacular traction; MH Macular hole;  NVD neovascularization of the disc; NVE neovascularization elsewhere; AREDS age related eye disease study; ARMD age related macular degeneration; POAG primary open angle glaucoma; EBMD epithelial/anterior basement membrane  dystrophy; ACIOL anterior chamber intraocular lens; IOL intraocular lens; PCIOL posterior chamber intraocular lens; Phaco/IOL phacoemulsification with intraocular lens placement; PRK photorefractive keratectomy; LASIK laser assisted in situ keratomileusis; HTN hypertension; DM diabetes mellitus; COPD chronic obstructive pulmonary disease

## 2024-08-09 DIAGNOSIS — M25642 Stiffness of left hand, not elsewhere classified: Secondary | ICD-10-CM | POA: Diagnosis not present

## 2024-08-09 DIAGNOSIS — R29898 Other symptoms and signs involving the musculoskeletal system: Secondary | ICD-10-CM | POA: Diagnosis not present

## 2024-08-09 DIAGNOSIS — M79645 Pain in left finger(s): Secondary | ICD-10-CM | POA: Diagnosis not present

## 2024-08-21 ENCOUNTER — Ambulatory Visit (INDEPENDENT_AMBULATORY_CARE_PROVIDER_SITE_OTHER): Payer: 59 | Admitting: Ophthalmology

## 2024-08-21 ENCOUNTER — Encounter (INDEPENDENT_AMBULATORY_CARE_PROVIDER_SITE_OTHER): Payer: Self-pay | Admitting: Ophthalmology

## 2024-08-21 DIAGNOSIS — H35033 Hypertensive retinopathy, bilateral: Secondary | ICD-10-CM | POA: Diagnosis not present

## 2024-08-21 DIAGNOSIS — E113213 Type 2 diabetes mellitus with mild nonproliferative diabetic retinopathy with macular edema, bilateral: Secondary | ICD-10-CM | POA: Diagnosis not present

## 2024-08-21 DIAGNOSIS — Z7984 Long term (current) use of oral hypoglycemic drugs: Secondary | ICD-10-CM

## 2024-08-21 DIAGNOSIS — I10 Essential (primary) hypertension: Secondary | ICD-10-CM | POA: Diagnosis not present

## 2024-08-21 DIAGNOSIS — H25813 Combined forms of age-related cataract, bilateral: Secondary | ICD-10-CM

## 2024-08-21 DIAGNOSIS — Z7985 Long-term (current) use of injectable non-insulin antidiabetic drugs: Secondary | ICD-10-CM

## 2024-08-23 NOTE — Progress Notes (Signed)
 Jared [name]                                          MRN: [id number]   08/23/2024   The [facility] Quality Team Specialist reviewed this patient medical record for the purposes of chart review for care gap closure. The following were reviewed: chart review for care gap closure-glycemic status assessment.    [facility] Quality Team

## 2024-08-26 ENCOUNTER — Encounter (INDEPENDENT_AMBULATORY_CARE_PROVIDER_SITE_OTHER): Payer: Self-pay | Admitting: Ophthalmology

## 2024-09-17 ENCOUNTER — Encounter: Payer: Self-pay | Admitting: Radiology

## 2024-09-20 ENCOUNTER — Other Ambulatory Visit: Payer: Self-pay | Admitting: Nurse Practitioner

## 2024-09-20 DIAGNOSIS — G8929 Other chronic pain: Secondary | ICD-10-CM

## 2024-09-21 ENCOUNTER — Other Ambulatory Visit: Payer: Self-pay

## 2024-09-21 MED ORDER — IBUPROFEN 800 MG PO TABS
800.0000 mg | ORAL_TABLET | Freq: Three times a day (TID) | ORAL | 0 refills | Status: DC | PRN
  Filled 2024-09-21: qty 30, 10d supply, fill #0

## 2024-09-21 NOTE — Telephone Encounter (Signed)
 Please advise [facility]

## 2024-09-24 ENCOUNTER — Encounter (HOSPITAL_COMMUNITY): Payer: Self-pay | Admitting: *Deleted

## 2024-09-24 ENCOUNTER — Other Ambulatory Visit: Payer: Self-pay

## 2024-09-24 ENCOUNTER — Emergency Department (HOSPITAL_COMMUNITY)
Admission: EM | Admit: 2024-09-24 | Discharge: 2024-09-24 | Disposition: A | Discharge status: Home / Self Care | Attending: Emergency Medicine | Admitting: Emergency Medicine

## 2024-09-24 DIAGNOSIS — E162 Hypoglycemia, unspecified: Secondary | ICD-10-CM | POA: Diagnosis present

## 2024-09-24 LAB — CBC
HCT: 40.8 % (ref 39.0–52.0)
Hemoglobin: 13 g/dL (ref 13.0–17.0)
MCH: 29.2 pg (ref 26.0–34.0)
MCHC: 31.9 g/dL (ref 30.0–36.0)
MCV: 91.7 fL (ref 80.0–100.0)
Platelets: 335 K/uL (ref 150–400)
RBC: 4.45 MIL/uL (ref 4.22–5.81)
RDW: 15.4 % (ref 11.5–15.5)
WBC: 8.8 K/uL (ref 4.0–10.5)
nRBC: 0 % (ref 0.0–0.2)

## 2024-09-24 LAB — BASIC METABOLIC PANEL WITH GFR
Anion gap: 12 (ref 5–15)
BUN: 13 mg/dL (ref 6–20)
CO2: 23 mmol/L (ref 22–32)
Calcium: 9.3 mg/dL (ref 8.9–10.3)
Chloride: 111 mmol/L (ref 98–111)
Creatinine, Ser: 1.19 mg/dL (ref 0.61–1.24)
GFR, Estimated: >60 mL/min (ref >60)
Glucose, Bld: 77 mg/dL (ref 70–99)
Potassium: 3.5 mmol/L (ref 3.5–5.1)
Sodium: 146 mmol/L — ABNORMAL HIGH (ref 135–145)

## 2024-09-24 LAB — CBG MONITORING, ED: Glucose-Capillary: 89 mg/dL (ref 70–99)

## 2024-09-24 LAB — TROPONIN T, HIGH SENSITIVITY: Troponin T High Sensitivity: 16 ng/L (ref 0–19)

## 2024-09-24 NOTE — ED Triage Notes (Signed)
 Started feeling jittery about 15 mins ago while sitting in the chair , thought maybe his blood sugar was getting low but it's 110; felt his heart beating fast/not normal during that time. Denies pain Fatigue x 3 days

## 2024-09-24 NOTE — Discharge Instructions (Signed)
 You might want hold your glipizide  until you talk with your family doctor on the phone.  Please return for a drop in your blood sugar that does not improve with eating.

## 2024-09-24 NOTE — ED Provider Notes (Signed)
 [name] AT [facility] Provider Note   CSN: [id number] Arrival date & time: 09/24/24  8072     Patient presents with: No chief complaint on file.   Jared [name] is a 58 y.o. male.   58 yo M with a chief complaints of an episode where his blood sugar went low.  He told me that he felt bad and got sweaty and then ended up checking his blood sugar and it was in the 100s and so he ate something and now feels much better.  Decided to come here for evaluation.  He denies any changes to his diabetes medications.  Denies cough congestion or fever denies nausea vomiting or diarrhea denies abdominal pain.  Feels like he has been eating and drinking normally.  Denies chest pain.        Prior to Admission medications   Medication Sig Start Date End Date Taking? Authorizing Provider  atorvastatin  (LIPITOR) 10 MG tablet Take 1 tablet (10 mg total) by mouth daily. 06/22/24   Paseda, Folashade R, FNP  docusate sodium  (COLACE) 250 MG capsule Take 1 capsule (250 mg total) by mouth daily as needed for constipation. 01/18/24   Paseda, Folashade R, FNP  glipiZIDE  (GLUCOTROL  XL) 5 MG 24 hr tablet Take 1 tablet (5 mg total) by mouth daily with breakfast. 07/23/24   Paseda, Folashade R, FNP  hydrOXYzine  (ATARAX ) 10 MG tablet Take 1 tablet (10 mg total) by mouth 3 (three) times daily as needed. 05/02/23   Tilford Bertram HERO, FNP  ibuprofen  (ADVIL ) 800 MG tablet Take 1 tablet (800 mg total) by mouth every 8 (eight) hours as needed. 09/21/24   Paseda, Folashade R, FNP  linaclotide  (LINZESS ) 145 MCG CAPS capsule Take 1 capsule (145 mcg total) by mouth daily before breakfast. 06/22/24   Paseda, Folashade R, FNP  lisinopril  (ZESTRIL ) 20 MG tablet Take 1 tablet (20 mg total) by mouth daily. 06/22/24   Paseda, Folashade R, FNP  meclizine  (ANTIVERT ) 25 MG tablet Take 1 tablet (25 mg total) by mouth 3 (three) times daily as needed for dizziness. Patient not taking: Reported on 07/31/2024 11/11/23    Randol Simmonds, MD  metFORMIN  (GLUCOPHAGE ) 1000 MG tablet Take 1 tablet (1,000 mg total) by mouth 2 (two) times daily with a meal. 06/22/24   Paseda, Folashade R, FNP  nicotine  (NICODERM CQ  - DOSED IN MG/24 HOURS) 14 mg/24hr patch Place 1 patch (14 mg total) onto the skin daily. Patient not taking: Reported on 07/31/2024 01/18/24   Paseda, Folashade R, FNP  Risankizumab -rzaa (SKYRIZI ) 360 MG/2.4ML SOCT Inject 360 mg into the skin every 8 (eight) weeks. 12/26/23   Stacia Glendia BRAVO, MD  TRADJENTA  5 MG TABS tablet TAKE 1 TABLET BY MOUTH DAILY 02/20/24   Nichols, Tonya S, NP  furosemide (LASIX) 10 MG/ML solution Take by mouth daily.  01/20/12  [provider]  mesalamine (PENTASA) 250 MG CR capsule Take 1,000 mg by mouth 4 (four) times daily.  01/20/12  [provider]  ONGLYZA 5 MG TABS tablet TAKE 1 TABLET (5 MG TOTAL) BY MOUTH DAILY. Patient not taking: Reported on 01/23/2024 06/02/20 06/03/20  Tilford Bertram HERO, FNP  pioglitazone (ACTOS) 15 MG tablet Take by mouth daily. Patient not taking: Reported on 01/23/2024  01/20/12  [provider]    Allergies: Oxycodone  and Trazodone  and nefazodone    Review of Systems  Updated Vital Signs BP (!) 150/78   Pulse 69   Temp 97.7 F (36.5 C) (Oral)  Resp 16   SpO2 100%   Physical Exam Vitals and nursing note reviewed.  Constitutional:      Appearance: He is well-developed.  HENT:     Head: Normocephalic and atraumatic.  Eyes:     Pupils: Pupils are equal, round, and reactive to light.  Neck:     Vascular: No JVD.  Cardiovascular:     Rate and Rhythm: Normal rate and regular rhythm.     Heart sounds: No murmur heard.    No friction rub. No gallop.  Pulmonary:     Effort: No respiratory distress.     Breath sounds: No wheezing.  Abdominal:     General: There is no distension.     Tenderness: There is no abdominal tenderness. There is no guarding or rebound.  Musculoskeletal:        General: Normal range of motion.      Cervical back: Normal range of motion and neck supple.  Skin:    Coloration: Skin is not pale.     Findings: No rash.  Neurological:     Mental Status: He is alert and oriented to person, place, and time.  Psychiatric:        Behavior: Behavior normal.     (all labs ordered are listed, but only abnormal results are displayed) Labs Reviewed  BASIC METABOLIC PANEL WITH GFR - Abnormal; Notable for the following components:      Result Value   Sodium 146 (*)    All other components within normal limits  CBC  CBG MONITORING, ED  TROPONIN T, HIGH SENSITIVITY  TROPONIN T, HIGH SENSITIVITY    EKG: EKG Interpretation Date/Time:  Monday September 24 2024 19:46:08 EST Ventricular Rate:  79 PR Interval:  148 QRS Duration:  99 QT Interval:  396 QTC Calculation: 454 R Axis:   52  Text Interpretation: Sinus rhythm Atrial premature complexes Inferior infarct, old No significant change since last tracing Confirmed by Emil Share [phone]) on 09/24/2024 9:50:30 PM  Radiology: No results found.   Procedures   Medications Ordered in the ED - No data to display                                  Medical Decision Making Amount and/or Complexity of Data Reviewed Labs: ordered.   58 yo M with a chief complaints of an episode of low blood sugar.  He tells that it was in the 100s but it was low for him and he felt bad with it.  He tells me he feels better now.  Blood sugars here are in normal range but lower than he is describing.  He is on glipizide , denies any changes to his medication.  No change to his renal function.   Patient feeling better.  Able to eat and drink here without issue.  Will discharge home.  PCP follow-up 9:50 PM:  I have discussed the diagnosis/risks/treatment options with the patient.  Evaluation and diagnostic testing in the emergency department does not suggest an emergent condition requiring admission or immediate intervention beyond what has been performed at this  time.  They will follow up with PCP. We also discussed returning to the ED immediately if new or worsening sx occur. We discussed the sx which are most concerning (e.g., sudden worsening pain, fever, inability to tolerate by mouth) that necessitate immediate return. Medications administered to the patient during their visit and any new prescriptions  provided to the patient are listed below.  Medications given during this visit Medications - No data to display   The patient appears reasonably screen and/or stabilized for discharge and I doubt any other medical condition or other [facility] requiring further screening, evaluation, or treatment in the ED at this time prior to discharge.         Final diagnoses:  Hypoglycemia    ED Discharge Orders     None          Emil Share, DO 09/24/24 2150

## 2024-10-01 ENCOUNTER — Encounter: Payer: Self-pay | Admitting: Nurse Practitioner

## 2024-10-01 ENCOUNTER — Other Ambulatory Visit: Payer: Self-pay

## 2024-10-01 ENCOUNTER — Ambulatory Visit (INDEPENDENT_AMBULATORY_CARE_PROVIDER_SITE_OTHER): Payer: Self-pay | Admitting: Nurse Practitioner

## 2024-10-01 VITALS — BP 125/74 | HR 66 | Wt 212.0 lb

## 2024-10-01 DIAGNOSIS — E083213 Diabetes mellitus due to underlying condition with mild nonproliferative diabetic retinopathy with macular edema, bilateral: Secondary | ICD-10-CM | POA: Insufficient documentation

## 2024-10-01 DIAGNOSIS — Z09 Encounter for follow-up examination after completed treatment for conditions other than malignant neoplasm: Secondary | ICD-10-CM

## 2024-10-01 DIAGNOSIS — E870 Hyperosmolality and hypernatremia: Secondary | ICD-10-CM

## 2024-10-01 DIAGNOSIS — K50918 Crohn's disease, unspecified, with other complication: Secondary | ICD-10-CM

## 2024-10-01 DIAGNOSIS — E162 Hypoglycemia, unspecified: Secondary | ICD-10-CM

## 2024-10-01 DIAGNOSIS — M79642 Pain in left hand: Secondary | ICD-10-CM

## 2024-10-01 DIAGNOSIS — E11311 Type 2 diabetes mellitus with unspecified diabetic retinopathy with macular edema: Secondary | ICD-10-CM | POA: Diagnosis not present

## 2024-10-01 DIAGNOSIS — F17200 Nicotine dependence, unspecified, uncomplicated: Secondary | ICD-10-CM

## 2024-10-01 DIAGNOSIS — I10 Essential (primary) hypertension: Secondary | ICD-10-CM | POA: Diagnosis not present

## 2024-10-01 DIAGNOSIS — E119 Type 2 diabetes mellitus without complications: Secondary | ICD-10-CM

## 2024-10-01 DIAGNOSIS — G8929 Other chronic pain: Secondary | ICD-10-CM

## 2024-10-01 DIAGNOSIS — K5900 Constipation, unspecified: Secondary | ICD-10-CM

## 2024-10-01 DIAGNOSIS — E1140 Type 2 diabetes mellitus with diabetic neuropathy, unspecified: Secondary | ICD-10-CM

## 2024-10-01 DIAGNOSIS — E785 Hyperlipidemia, unspecified: Secondary | ICD-10-CM

## 2024-10-01 DIAGNOSIS — M25511 Pain in right shoulder: Secondary | ICD-10-CM

## 2024-10-01 LAB — POCT GLYCOSYLATED HEMOGLOBIN (HGB A1C): Hemoglobin A1C: 7.9 % — AB (ref 4.0–5.6)

## 2024-10-01 MED ORDER — GLIPIZIDE ER 5 MG PO TB24
5.0000 mg | ORAL_TABLET | Freq: Every day | ORAL | 1 refills | Status: AC
  Filled 2024-10-01: qty 90, 90d supply, fill #0

## 2024-10-01 MED ORDER — LINAGLIPTIN 5 MG PO TABS
5.0000 mg | ORAL_TABLET | Freq: Every day | ORAL | 2 refills | Status: AC
  Filled 2024-10-01 – 2024-10-26 (×2): qty 90, 90d supply, fill #0

## 2024-10-01 MED ORDER — IBUPROFEN 800 MG PO TABS
800.0000 mg | ORAL_TABLET | Freq: Three times a day (TID) | ORAL | 0 refills | Status: AC | PRN
  Filled 2024-10-26: qty 30, 10d supply, fill #0

## 2024-10-01 MED ORDER — METFORMIN HCL 1000 MG PO TABS
1000.0000 mg | ORAL_TABLET | Freq: Two times a day (BID) | ORAL | 1 refills | Status: AC
  Filled 2024-10-01: qty 180, 90d supply, fill #0

## 2024-10-01 NOTE — Assessment & Plan Note (Signed)
 Hospital chart reviewed, including discharge summary Medications reconciled and reviewed with the patient in detail

## 2024-10-01 NOTE — Assessment & Plan Note (Addendum)
 Lab Results  Component Value Date   HGBA1C 7.9 (A) 10/01/2024   Type 2 diabetes mellitus with mild nonproliferative diabetic retinopathy with macular edema A1c improved to 7.9 but remains above target.no hypoglycemic episode noted. Mild nonproliferative diabetic retinopathy with macular edema, no current bleeding. Risk of cardiovascular and renal complications with elevated A1c. - Restart Tradjenta  5 mg daily and glipizide  5 mg daily as previously prescribed.  Continue metformin  1000 mg twice daily - Monitor blood sugar levels, ensuring he does not drop below 70 mg/dL.  CBG goals provided - Encouraged dietary modifications to maintain blood sugar levels.

## 2024-10-01 NOTE — Assessment & Plan Note (Signed)
 Continue atorvastatin  10 mg daily Lab Results  Component Value Date   CHOL 90 (L) 01/18/2024   HDL 31 (L) 01/18/2024   LDLCALC 44 01/18/2024   TRIG 65 01/18/2024   CHOLHDL 2.9 01/18/2024

## 2024-10-01 NOTE — Assessment & Plan Note (Signed)
 BP Readings from Last 3 Encounters:  10/01/24 125/74  09/24/24 (!) 150/78  07/31/24 129/67   HTN Controlled .  On lisinopril  20 mg daily Continue current medications. No changes in management. Discussed DASH diet and dietary sodium restrictions Continue to increase dietary efforts and exercise.

## 2024-10-01 NOTE — Assessment & Plan Note (Addendum)
  Managed with Skyrizi  injections. Recent abdominal pain reported. - Continue Skyrizi  360 mg subcutaneous every 8 weeks. - Follow up with gastroenterologist.

## 2024-10-01 NOTE — Assessment & Plan Note (Addendum)
 Currently denies pain Has completed occupational therapy Encouraged range of motion of the fingers and hand  to prevent contracture

## 2024-10-01 NOTE — Progress Notes (Signed)
 Established Patient Office Visit  Subjective:  Patient ID: Jared [name], male    DOB: [birth year]  Age: 58 y.o. MRN: [id number]  CC:  Chief Complaint  Patient presents with   Diabetes    HPI   Discussed the use of AI scribe software for clinical note transcription with the patient, who gave verbal consent to proceed.  History of Present Illness Jared [name] is a 58 year old male  has a past medical history of Crohn disease (HCC) (2008), Diabetes mellitus type II, and Hypertension.  who presents for follow-up on his diabetes management.  He stopped taking Tradjenta  three weeks ago due to feeling 'weird' after taking it for three days. He also discontinued glipizide  following a recent hospital visit where he was advised to stop it due to an irregular heartbeat. He continues to take metformin  1000 mg twice a day. Stated that  his blood sugar levels have fluctuated between 89 and 200 mg/dL over the past two months, with an A1c of 7.9% today , improved from 9.2% two months ago.  He visited the emergency room seven days ago due to shakiness in his hands, initially thought to be low blood sugar, but his blood sugar was 89 mg/dL at home and remained stable. During the ER visit,stated that  he was found to have an extra heartbeat, but no chest pain or shortness of breath was reported.  He reports that his eye doctor last month said his eyes were the same as before and that there was no bleeding. He saw an eye doctor last month who told him his eyes were the same and there was no bleeding.  high sodium level of 146 mmol/L from recent labs. He has stopped consuming [employer] energy drinks and is working on quitting smoking, having not smoked for the past three days.  He experiences occasional constipation and takes Linzess  PRN instaed of daily, but reports it is slow to work. He also takes ibuprofen  for shoulder pain and Skyrizi  injections every eight weeks for Crohn's disease.  He has a history of  high cholesterol and takes atorvastatin  10 mg daily. He is also on lisinopril  for blood pressure management. He has not received a flu shot this year due to past adverse reactions.  Assessment & Plan .    Past Medical History:  Diagnosis Date   Crohn disease (HCC) 2008   with hospital admission in 2011, and 8/12 for flare up and SBO relieved with bowel rest. no suregery, no meds for CD   Diabetes mellitus type II    Hypertension     Past Surgical History:  Procedure Laterality Date   BOWEL RESECTION  01/25/2012   Procedure: SMALL BOWEL RESECTION;  Surgeon: Debby LABOR. 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 Cornett, MD;  Location: [facility] OR;  Service: General;  Laterality: N/A;   SCROTAL SURGERY  ~ 1971   took out a pellet    Family History  Problem Relation Age of Onset   Diabetes Mother    Alopecia Mother    Coronary artery disease Mother    Lung cancer Mother        smoker but had quit   Dementia Father    Diabetes Sister    Hyperlipidemia Sister    Hypertension Sister    Angina Brother  Hepatitis Brother    Alcohol abuse Brother    Diabetes Brother    Colon cancer Paternal Grandmother    Esophageal cancer Neg Hx    Stomach cancer Neg Hx    Rectal cancer Neg Hx     Social History   Socioeconomic History   Marital status: Single    Spouse name: Not on file   Number of children: 0   Years of education: Not on file   Highest education level: Not on file  Occupational History   Not on file  Tobacco Use   Smoking status: Some Days    Current packs/day: 0.25    Average packs/day: 0.3 packs/day for 30.0 years (7.5 ttl pk-yrs)    Types: Cigarettes   Smokeless tobacco: Former    Types: Chew    Quit date: 01/20/1997  Vaping Use   Vaping status: Never Used  Substance and Sexual Activity   Alcohol use:  Not Currently   Drug use: No   Sexual activity: Not Currently  Other Topics Concern   Not on file  Social History Narrative   Lives in [place].Is single. Has a sister in [place]. He has a twin brother that passed away from autoimmune hepatitis.  Smokes 2-3 cigarettes a day. Drinks beer once every few months. No history of drug abuse. Studied up to 12th grade. Has orange card. No health insurance. Currently employed cleaning buildings.   Social Drivers of Corporate Investment Banker Strain: Low Risk  (01/27/2023)   Overall Financial Resource Strain (CARDIA)    Difficulty of Paying Living Expenses: Not hard at all  Food Insecurity: Low Risk  (05/31/2024)   Received from [facility]   Hunger Vital Sign    Within the past 12 months, you worried that your food would run out before you got money to buy more: Never true    Within the past 12 months, the food you bought just didn't last and you didn't have money to get more. : Never true  Transportation Needs: No Transportation Needs (05/31/2024)   Received from [facility]    In the past 12 months, has lack of reliable transportation kept you from medical appointments, meetings, work or from getting things needed for daily living? : No  Physical Activity: Insufficiently Active (01/27/2023)   Exercise Vital Sign    Days of Exercise per Week: 2 days    Minutes of Exercise per Session: 20 min  Stress: No Stress Concern Present (01/27/2023)   [employer] of Occupational Health - Occupational Stress Questionnaire    Feeling of Stress : Not at all  Social Connections: Socially Isolated (01/27/2023)   Social Connection and Isolation Panel    Frequency of Communication with Friends and Family: More than three times a week    Frequency of Social Gatherings with Friends and Family: More than three times a week    Attends Religious Services: Never    Database Administrator or Organizations: No    Attends Tax Inspector Meetings: Never    Marital Status: Never married  Intimate Partner Violence: Not At Risk (04/17/2023)   Humiliation, Afraid, Rape, and Kick questionnaire    Fear of Current or Ex-Partner: No    Emotionally Abused: No    Physically Abused: No    Sexually Abused: No    Outpatient Medications Prior to Visit  Medication Sig Dispense Refill   atorvastatin  (LIPITOR) 10 MG tablet Take 1 tablet (10 mg total) by  mouth daily. 90 tablet 1   docusate sodium  (COLACE) 250 MG capsule Take 1 capsule (250 mg total) by mouth daily as needed for constipation. 30 capsule 0   hydrOXYzine  (ATARAX ) 10 MG tablet Take 1 tablet (10 mg total) by mouth 3 (three) times daily as needed. 30 tablet 5   linaclotide  (LINZESS ) 145 MCG CAPS capsule Take 1 capsule (145 mcg total) by mouth daily before breakfast. 30 capsule 3   lisinopril  (ZESTRIL ) 20 MG tablet Take 1 tablet (20 mg total) by mouth daily. 90 tablet 2   meclizine  (ANTIVERT ) 25 MG tablet Take 1 tablet (25 mg total) by mouth 3 (three) times daily as needed for dizziness. 21 tablet 0   Risankizumab -rzaa (SKYRIZI ) 360 MG/2.4ML SOCT Inject 360 mg into the skin every 8 (eight) weeks. 2.4 mL 6   ibuprofen  (ADVIL ) 800 MG tablet Take 1 tablet (800 mg total) by mouth every 8 (eight) hours as needed. 30 tablet 0   metFORMIN  (GLUCOPHAGE ) 1000 MG tablet Take 1 tablet (1,000 mg total) by mouth 2 (two) times daily with a meal. 180 tablet 1   nicotine  (NICODERM CQ  - DOSED IN MG/24 HOURS) 14 mg/24hr patch Place 1 patch (14 mg total) onto the skin daily. (Patient not taking: Reported on 10/01/2024) 42 patch 0   glipiZIDE  (GLUCOTROL  XL) 5 MG 24 hr tablet Take 1 tablet (5 mg total) by mouth daily with breakfast. (Patient not taking: Reported on 10/01/2024) 60 tablet 1   TRADJENTA  5 MG TABS tablet TAKE 1 TABLET BY MOUTH DAILY (Patient not taking: Reported on 10/01/2024) 90 tablet 11   No facility-administered medications prior to visit.    Allergies  Allergen Reactions    Oxycodone  Itching   Trazodone  And Nefazodone     hallucinations    ROS Review of Systems  Constitutional:  Negative for appetite change, chills, fatigue and fever.  HENT:  Negative for congestion, postnasal drip, rhinorrhea and sneezing.   Respiratory:  Negative for cough, shortness of breath and wheezing.   Cardiovascular:  Negative for chest pain, palpitations and leg swelling.  Gastrointestinal:  Positive for constipation. Negative for abdominal pain, nausea and vomiting.  Genitourinary:  Negative for difficulty urinating, dysuria, flank pain and frequency.  Musculoskeletal:  Positive for arthralgias. Negative for back pain, joint swelling and myalgias.  Skin:  Negative for color change, pallor, rash and wound.  Neurological:  Negative for dizziness, facial asymmetry, weakness, numbness and headaches.  Psychiatric/Behavioral:  Negative for behavioral problems, confusion, self-injury and suicidal ideas.       Objective:    Physical Exam Vitals and nursing note reviewed.  Constitutional:      General: He is not in acute distress.    Appearance: Normal appearance. He is not ill-appearing, toxic-appearing or diaphoretic.  HENT:     Mouth/Throat:     Mouth: Mucous membranes are moist.     Pharynx: Oropharynx is clear. No oropharyngeal exudate or posterior oropharyngeal erythema.  Eyes:     General: No scleral icterus.       Right eye: No discharge.        Left eye: No discharge.     Extraocular Movements: Extraocular movements intact.     Conjunctiva/sclera: Conjunctivae normal.  Cardiovascular:     Rate and Rhythm: Normal rate and regular rhythm.     Pulses: Normal pulses.     Heart sounds: Normal heart sounds. No murmur heard.    No friction rub. No gallop.  Pulmonary:     Effort:  Pulmonary effort is normal. No respiratory distress.     Breath sounds: Normal breath sounds. No stridor. No wheezing, rhonchi or rales.  Chest:     Chest wall: No tenderness.  Abdominal:      General: There is no distension.     Palpations: Abdomen is soft.     Tenderness: There is no abdominal tenderness. There is no right CVA tenderness, left CVA tenderness or guarding.  Musculoskeletal:        General: No swelling, tenderness, deformity or signs of injury.     Right lower leg: No edema.     Left lower leg: No edema.     Comments: Limited range of motion of left fingers  Skin:    General: Skin is warm and dry.     Capillary Refill: Capillary refill takes less than 2 seconds.     Coloration: Skin is not jaundiced or pale.     Findings: No bruising, erythema or lesion.  Neurological:     Mental Status: He is alert and oriented to person, place, and time.     Motor: No weakness.     Gait: Gait normal.  Psychiatric:        Mood and Affect: Mood normal.        Behavior: Behavior normal.        Thought Content: Thought content normal.        Judgment: Judgment normal.     BP 125/74   Pulse 66   Wt 212 lb (96.2 kg)   SpO2 100%   BMI 27.22 kg/m  Wt Readings from Last 3 Encounters:  10/01/24 212 lb (96.2 kg)  07/31/24 213 lb (96.6 kg)  06/22/24 214 lb (97.1 kg)    No results found for: TSH Lab Results  Component Value Date   WBC 8.8 09/24/2024   HGB 13.0 09/24/2024   HCT 40.8 09/24/2024   MCV 91.7 09/24/2024   PLT 335 09/24/2024   Lab Results  Component Value Date   NA 146 (H) 09/24/2024   K 3.5 09/24/2024   CO2 23 09/24/2024   GLUCOSE 77 09/24/2024   BUN 13 09/24/2024   CREATININE 1.19 09/24/2024   BILITOT 0.4 06/22/2024   ALKPHOS 82 06/22/2024   AST 14 06/22/2024   ALT 21 06/22/2024   PROT 6.7 06/22/2024   ALBUMIN 4.1 06/22/2024   CALCIUM  9.3 09/24/2024   ANIONGAP 12 09/24/2024   EGFR 84 06/22/2024   Lab Results  Component Value Date   CHOL 90 (L) 01/18/2024   Lab Results  Component Value Date   HDL 31 (L) 01/18/2024   Lab Results  Component Value Date   LDLCALC 44 01/18/2024   Lab Results  Component Value Date   TRIG 65  01/18/2024   Lab Results  Component Value Date   CHOLHDL 2.9 01/18/2024   Lab Results  Component Value Date   HGBA1C 7.9 (A) 10/01/2024      Assessment & Plan:   Problem List Items Addressed This Visit       Cardiovascular and Mediastinum   Essential hypertension   BP Readings from Last 3 Encounters:  10/01/24 125/74  09/24/24 (!) 150/78  07/31/24 129/67   HTN Controlled .  On lisinopril  20 mg daily Continue current medications. No changes in management. Discussed DASH diet and dietary sodium restrictions Continue to increase dietary efforts and exercise.           Digestive   Crohn's disease [facility])    Managed  with Skyrizi  injections. Recent abdominal pain reported. - Continue Skyrizi  360 mg subcutaneous every 8 weeks. - Follow up with gastroenterologist.         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 Mild nonproliferative diabetic retinopathy with macular edema, no current bleeding. Risk of cardiovascular and renal complications with elevated A1c. - Restart Tradjenta  5 mg daily and glipizide  5 mg daily as previously prescribed.  Continue metformin  1000 mg twice daily - Monitor blood sugar levels, ensuring he does not drop below 70 mg/dL.  CBG goals provided - Encouraged dietary modifications to maintain blood sugar levels.       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 - Encouraged smoking cessation.       Dyslipidemia, goal LDL below 70   Continue atorvastatin  10 mg daily Lab Results  Component Value Date   CHOL 90 (L) 01/18/2024   HDL 31 (L) 01/18/2024   LDLCALC 44 01/18/2024   TRIG 65 01/18/2024   CHOLHDL 2.9 01/18/2024              Relevant Orders   Direct LDL   Constipation    Chronic constipation - Advised to take Linzess  145 mcg daily instead of p.m. Increase intake of fiber and maintain hydration        Pain of left hand   Currently denies pain Has completed occupational therapy Encouraged range of motion of the fingers and hand  to prevent contracture      Chronic pain of both shoulders   Continue ibuprofen  800 mg 3 times daily as needed, to take medication with food      Relevant Medications   ibuprofen  (ADVIL ) 800 MG tablet   Hypernatremia   Lab Results  Component Value Date   NA 146 (H) 09/24/2024   K 3.5 09/24/2024   CO2 23 09/24/2024   GLUCOSE 77 09/24/2024   BUN 13 09/24/2024   CREATININE 1.19 09/24/2024   CALCIUM  9.3 09/24/2024   EGFR 84 06/22/2024   GFRNONAA >60 09/24/2024   Recent sodium level elevated at 146 mmol/L. -  Ensure adequate hydration  with at least 64 ounces of water daily.  DASH diet advised - Will recheck sodium levels      Relevant Orders   Sodium   Encounter for examination following treatment at hospital   Hospital chart reviewed, including discharge summary Medications reconciled and reviewed with the patient in detail        Meds ordered this encounter  Medications   linagliptin  (TRADJENTA ) 5 MG TABS tablet    Sig: Take 1 tablet (5 mg total) by mouth daily.    Dispense:  90 tablet    Refill:  2   glipiZIDE  (GLUCOTROL  XL) 5 MG 24 hr tablet    Sig: Take 1 tablet (5 mg total) by mouth daily with breakfast.    Dispense:  90 tablet    Refill:  1   metFORMIN  (GLUCOPHAGE ) 1000 MG tablet    Sig: Take 1 tablet (1,000 mg total) by mouth 2 (two) times daily with a meal.    Dispense:  180 tablet    Refill:  1   ibuprofen  (ADVIL ) 800 MG tablet    Sig: Take 1 tablet (800 mg total) by mouth every 8 (eight) hours as needed.    Dispense:  30 tablet    Refill:  0    Follow-up: Return in about 3 months (around 01/01/2025) for HTN, HYPERLIPIDEMIA, DM.    Janiel Crisostomo R Karron Goens, FNP

## 2024-10-01 NOTE — Patient Instructions (Signed)
 Goal for fasting blood sugar ranges from 80 to 120 and 2 hours after any meal or at bedtime should be between 130 to 170.     . Type 2 diabetes mellitus with diabetic neuropathy, without long-term current use of insulin  (HCC) (Primary)  - linagliptin  (TRADJENTA ) 5 MG TABS tablet; Take 1 tablet (5 mg total) by mouth daily.  Dispense: 90 tablet; Refill: 2 - glipiZIDE  (GLUCOTROL  XL) 5 MG 24 hr tablet; Take 1 tablet (5 mg total) by mouth daily with breakfast.  Dispense: 90 tablet; Refill: 1 - metFORMIN  (GLUCOPHAGE ) 1000 MG tablet; Take 1 tablet (1,000 mg total) by mouth 2 (two) times daily with a meal.  Dispense: 180 tablet; Refill: 1   It is important that you exercise regularly at least 30 minutes 5 times a week as tolerated  Think about what you will eat, plan ahead. Choose  clean, green, fresh or frozen over canned, processed or packaged foods which are more sugary, salty and fatty. 70 to 75% of food eaten should be vegetables and fruit. Three meals at set times with snacks allowed between meals, but they must be fruit or vegetables. Aim to eat over a 12 hour period , example 7 am to 7 pm, and STOP after  your last meal of the day. Drink water,generally about 64 ounces per day, no other drink is as healthy. Fruit juice is best enjoyed in a healthy way, by EATING the fruit.  Thanks for choosing [facility] we consider it a privelige to serve you.

## 2024-10-01 NOTE — Assessment & Plan Note (Addendum)
  Chronic constipation - Advised to take Linzess  145 mcg daily instead of p.m. Increase intake of fiber and maintain hydration

## 2024-10-01 NOTE — Assessment & Plan Note (Signed)
 Tobacco use disorder Recently quit smoking three days ago. - Encouraged smoking cessation.

## 2024-10-01 NOTE — Assessment & Plan Note (Signed)
 Denies recent hypoglycemia CBG goals provided Advised to report CBG readings consistently less than 70

## 2024-10-01 NOTE — Assessment & Plan Note (Signed)
 Lab Results  Component Value Date   NA 146 (H) 09/24/2024   K 3.5 09/24/2024   CO2 23 09/24/2024   GLUCOSE 77 09/24/2024   BUN 13 09/24/2024   CREATININE 1.19 09/24/2024   CALCIUM  9.3 09/24/2024   EGFR 84 06/22/2024   GFRNONAA >60 09/24/2024   Recent sodium level elevated at 146 mmol/L. - Ensure adequate hydration with at least 64 ounces of water daily.  DASH diet advised - Will recheck sodium levels

## 2024-10-01 NOTE — Assessment & Plan Note (Signed)
 Up-to-date with yearly diabetic eye exam Need to get diabetic under control discussed Maintain close follow-up with ophthalmology

## 2024-10-01 NOTE — Assessment & Plan Note (Signed)
 Continue ibuprofen  800 mg 3 times daily as needed, to take medication with food

## 2024-10-02 ENCOUNTER — Ambulatory Visit: Payer: Self-pay | Admitting: Nurse Practitioner

## 2024-10-02 LAB — LDL CHOLESTEROL, DIRECT: LDL Direct: 54 mg/dL (ref 0–99)

## 2024-10-02 LAB — SODIUM: Sodium: 142 mmol/L (ref 134–144)

## 2024-10-02 LAB — MICROALBUMIN / CREATININE URINE RATIO
Creatinine, Urine: 110.7 mg/dL
Microalb/Creat Ratio: 10 mg/g{creat} (ref 0–29)
Microalbumin, Urine: 10.9 ug/mL

## 2024-10-26 ENCOUNTER — Other Ambulatory Visit: Payer: Self-pay

## 2024-11-05 ENCOUNTER — Other Ambulatory Visit: Payer: Self-pay

## 2024-11-06 ENCOUNTER — Other Ambulatory Visit: Payer: Self-pay

## 2024-11-22 ENCOUNTER — Ambulatory Visit: Payer: Self-pay

## 2024-11-23 ENCOUNTER — Encounter: Payer: Self-pay | Admitting: Nurse Practitioner

## 2024-11-23 ENCOUNTER — Ambulatory Visit: Payer: Self-pay | Admitting: Nurse Practitioner

## 2024-11-23 VITALS — BP 118/57 | HR 79 | Ht 74.0 in | Wt 213.0 lb

## 2024-11-23 DIAGNOSIS — Z0000 Encounter for general adult medical examination without abnormal findings: Secondary | ICD-10-CM | POA: Insufficient documentation

## 2024-11-23 DIAGNOSIS — K5900 Constipation, unspecified: Secondary | ICD-10-CM | POA: Diagnosis not present

## 2024-11-23 DIAGNOSIS — K50919 Crohn's disease, unspecified, with unspecified complications: Secondary | ICD-10-CM

## 2024-11-23 DIAGNOSIS — F17200 Nicotine dependence, unspecified, uncomplicated: Secondary | ICD-10-CM | POA: Diagnosis not present

## 2024-11-23 DIAGNOSIS — I10 Essential (primary) hypertension: Secondary | ICD-10-CM | POA: Diagnosis not present

## 2024-11-23 DIAGNOSIS — E119 Type 2 diabetes mellitus without complications: Secondary | ICD-10-CM | POA: Diagnosis not present

## 2024-11-23 DIAGNOSIS — E785 Hyperlipidemia, unspecified: Secondary | ICD-10-CM

## 2024-11-23 DIAGNOSIS — Z125 Encounter for screening for malignant neoplasm of prostate: Secondary | ICD-10-CM

## 2024-11-23 NOTE — Assessment & Plan Note (Signed)
 Lab Results  Component Value Date   CHOL 90 (L) 01/18/2024   HDL 31 (L) 01/18/2024   LDLCALC 44 01/18/2024   LDLDIRECT 54 10/01/2024   TRIG 65 01/18/2024   CHOLHDL 2.9 01/18/2024    Managed with atorvastatin  10 mg daily. Recent cholesterol levels good. - Continue atorvastatin  10 mg daily.  s.

## 2024-11-23 NOTE — Progress Notes (Signed)
 "  Chief Complaint  Patient presents with   Medicare Wellness     Subjective:   Jared [name] is a 59 y.o. male who presents for a Medicare Annual Wellness Visit.  Visit info / Clinical Intake: Living arrangements:: (!) (Patient-Rptd) lives alone Patient's Overall Health Status Rating: (Patient-Rptd) good Typical amount of pain: (!) (Patient-Rptd) a lot Does pain affect daily life?: (!) (Patient-Rptd) yes  Dietary Habits and Nutritional Risks How many meals a day?: (Patient-Rptd) 2 Eats fruit and vegetables daily?: (Patient-Rptd) yes Most meals are obtained by: (Patient-Rptd) preparing own meals  Functional Status Activities of Daily Living (to include ambulation/medication): (Patient-Rptd) Independent Ambulation: (Patient-Rptd) Independent Medication Administration: (Patient-Rptd) Independent Home Management (perform basic housework or laundry): (Patient-Rptd) Independent Manage your own finances?: (Patient-Rptd) yes Primary transportation is: (Patient-Rptd) driving  Fall Screening Falls in the past year?: 0 Number of falls in past year: 0 Was there an injury with Fall?: 0 Fall Risk Category Calculator: 0 Patient Fall Risk Level: Low Fall Risk  Fall Risk Patient at Risk for Falls Due to: No Fall Risks Fall risk Follow up: Falls evaluation completed  Home and Transportation Safety: All rugs have non-skid backing?: (Patient-Rptd) yes All stairs or steps have railings?: (!) (Patient-Rptd) no Grab bars in the bathtub or shower?: (!) (Patient-Rptd) no Have non-skid surface in bathtub or shower?: (!) (Patient-Rptd) no Good home lighting?: (Patient-Rptd) yes Regular seat belt use?: (!) (Patient-Rptd) no Hospital stays in the last year:: (Patient-Rptd) no  Cognitive Assessment Difficulty concentrating, remembering, or making decisions? : (Patient-Rptd) yes Was the patient able to repeat memory words in 3 tries?: yes Which version was used?: Version1 : banana, sunrise,  chair Clock numbers correct?: yes Clock time correct (11:10)?: yes Normal clock drawing test?: 2 How many words correct?: 2 Which version was used?: Version 1: banana, sunrise, chair Mini-Cog Scoring: 4  Advance Directives (For Healthcare) Does Patient Have a Medical Advance Directive?: No    Allergies (verified) Oxycodone  and Trazodone  and nefazodone   Current Medications (verified) Outpatient Encounter Medications as of 11/23/2024  Medication Sig   atorvastatin  (LIPITOR) 10 MG tablet Take 1 tablet (10 mg total) by mouth daily.   docusate sodium  (COLACE) 250 MG capsule Take 1 capsule (250 mg total) by mouth daily as needed for constipation.   empagliflozin  (JARDIANCE ) 10 MG TABS tablet Take by mouth daily.   glipiZIDE  (GLUCOTROL  XL) 5 MG 24 hr tablet Take 1 tablet (5 mg total) by mouth daily with breakfast.   hydrOXYzine  (ATARAX ) 10 MG tablet Take 1 tablet (10 mg total) by mouth 3 (three) times daily as needed.   ibuprofen  (ADVIL ) 800 MG tablet Take 1 tablet (800 mg total) by mouth every 8 (eight) hours as needed.   lisinopril  (ZESTRIL ) 20 MG tablet Take 1 tablet (20 mg total) by mouth daily.   meclizine  (ANTIVERT ) 25 MG tablet Take 1 tablet (25 mg total) by mouth 3 (three) times daily as needed for dizziness.   metFORMIN  (GLUCOPHAGE ) 1000 MG tablet Take 1 tablet (1,000 mg total) by mouth 2 (two) times daily with a meal.   Risankizumab -rzaa (SKYRIZI ) 360 MG/2.4ML SOCT Inject 360 mg into the skin every 8 (eight) weeks.   linaclotide  (LINZESS ) 145 MCG CAPS capsule Take 1 capsule (145 mcg total) by mouth daily before breakfast. (Patient not taking: Reported on 11/23/2024)   linagliptin  (TRADJENTA ) 5 MG TABS tablet Take 1 tablet (5 mg total) by mouth daily. (Patient not taking: Reported on 11/23/2024)   nicotine  (NICODERM CQ  - DOSED  IN MG/24 HOURS) 14 mg/24hr patch Place 1 patch (14 mg total) onto the skin daily. (Patient not taking: Reported on 11/23/2024)   [DISCONTINUED] furosemide (LASIX) 10  MG/ML solution Take by mouth daily.   [DISCONTINUED] mesalamine (PENTASA) 250 MG CR capsule Take 1,000 mg by mouth 4 (four) times daily.   [DISCONTINUED] ONGLYZA 5 MG TABS tablet TAKE 1 TABLET (5 MG TOTAL) BY MOUTH DAILY. (Patient not taking: Reported on 01/23/2024)   [DISCONTINUED] pioglitazone (ACTOS) 15 MG tablet Take by mouth daily. (Patient not taking: Reported on 01/23/2024)   No facility-administered encounter medications on file as of 11/23/2024.    History: Past Medical History:  Diagnosis Date   Crohn disease (HCC) 2008   with hospital admission in 2011, and 8/12 for flare up and SBO relieved with bowel rest. no suregery, no meds for CD   Diabetes mellitus type II    Hypertension    Past Surgical History:  Procedure Laterality Date   BOWEL RESECTION  01/25/2012   Procedure: SMALL BOWEL RESECTION;  Surgeon: Debby LABOR. 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 Cornett, MD;  Location: [facility] OR;  Service: General;  Laterality: N/A;   SCROTAL SURGERY  ~ 1971   took out a pellet   Family History  Problem Relation Age of Onset   Diabetes Mother    Alopecia Mother    Coronary artery disease Mother    Lung cancer Mother        smoker but had quit   Dementia Father    Diabetes Sister    Hyperlipidemia Sister    Hypertension Sister    Angina Brother    Hepatitis Brother    Alcohol abuse Brother    Diabetes Brother    Colon cancer Paternal Grandmother    Esophageal cancer Neg Hx    Stomach cancer Neg Hx    Rectal cancer Neg Hx    Social History   Occupational History   Not on file  Tobacco Use   Smoking status: Some Days    Current packs/day: 0.25    Average packs/day: 0.3 packs/day for 30.0 years (7.5 ttl pk-yrs)    Types: Cigarettes   Smokeless tobacco: Former    Types: Chew    Quit date: 01/20/1997  Vaping  Use   Vaping status: Never Used  Substance and Sexual Activity   Alcohol use: Not Currently   Drug use: No   Sexual activity: Not Currently   Tobacco Counseling Ready to quit: No Counseling given: Yes  SDOH Screenings   Food Insecurity: No Food Insecurity (11/23/2024)  Recent Concern: Food Insecurity - Food Insecurity Present (11/22/2024)  Housing: Low Risk (11/23/2024)  Transportation Needs: No Transportation Needs (11/23/2024)  Utilities: Not At Risk (11/23/2024)  Alcohol Screen: Low Risk (01/27/2023)  Depression (PHQ2-9): Low Risk (11/23/2024)  Financial Resource Strain: Medium Risk (11/22/2024)  Physical Activity: Insufficiently Active (11/22/2024)  Social Connections: Socially Isolated (11/22/2024)  Stress: Stress Concern Present (11/22/2024)  Tobacco Use: High Risk (11/23/2024)   See flowsheets for full screening details  Depression Screen PHQ 2 & 9 Depression Scale- Over the past 2 weeks, how often have you been bothered by any of the following problems? Little interest or pleasure in doing things: 0 Feeling down, depressed, or hopeless (PHQ Adolescent also includes...irritable): 0 PHQ-2 Total Score: 0 Trouble  falling or staying asleep, or sleeping too much: 0 Feeling tired or having little energy: 0 Poor appetite or overeating (PHQ Adolescent also includes...weight loss): 0 Feeling bad about yourself - or that you are a failure or have let yourself or your family down: 0 Trouble concentrating on things, such as reading the newspaper or watching television (PHQ Adolescent also includes...like school work): 0 Moving or speaking so slowly that other people could have noticed. Or the opposite - being so fidgety or restless that you have been moving around a lot more than usual: 0 Thoughts that you would be better off dead, or of hurting yourself in some way: 0 PHQ-9 Total Score: 0 If you checked off any problems, how difficult have these problems made it for you to do your work, take care of  things at home, or get along with other people?: Not difficult at all  Depression Treatment Depression Interventions/Treatment : [id number] Score <4 Follow-up Not Indicated     Goals Addressed   None          Objective:    Today's Vitals   11/23/24 0803 11/23/24 0831  BP: 130/70 (!) 118/57  Pulse: 79   SpO2: 100%   Weight: 213 lb (96.6 kg)   Height: 6' 2 (1.88 m)    Body mass index is 27.35 kg/m.  Hearing/Vision screen No results found. Immunizations and Health Maintenance Health Maintenance  Topic Date Due   Hepatitis B Vaccines 19-59 Average Risk (1 of 3 - 19+ 3-dose series) Never done   Pneumococcal Vaccine: 50+ Years (2 of 2 - PCV) 06/28/2015   Zoster Vaccines- Shingrix (1 of 2) Never done   COVID-19 Vaccine (2 - 2025-26 season) 07/16/2024   Influenza Vaccine  02/12/2025 (Originally 06/15/2024)   FOOT EXAM  01/17/2025   HEMOGLOBIN A1C  03/31/2025   OPHTHALMOLOGY EXAM  08/21/2025   Diabetic kidney evaluation - eGFR measurement  09/24/2025   Diabetic kidney evaluation - Urine ACR  10/01/2025   Medicare Annual Wellness (AWV)  11/23/2025   Colonoscopy  01/16/2029   DTaP/Tdap/Td (4 - Td or Tdap) 04/24/2034   Hepatitis C Screening  Completed   HIV Screening  Completed   HPV VACCINES  Aged Out   Meningococcal B Vaccine  Aged Out        Assessment/Plan:  This is a routine wellness examination for Jared [name].  Patient Care Team: Camella Seim R, FNP as PCP - General (Nurse Practitioner) Valdemar Rogue, MD as Consulting Physician (Ophthalmology)  I have personally reviewed and noted the following in the patients chart:   Medical and social history Use of alcohol, tobacco or illicit drugs  Current medications and supplements including opioid prescriptions. Functional ability and status Nutritional status Physical activity Advanced directives List of other physicians Hospitalizations, surgeries, and ER visits in previous 12 months Vitals Screenings to include  cognitive, depression, and falls Referrals and appointments  Orders Placed This Encounter  Procedures   Basic Metabolic Panel   PSA   In addition, I have reviewed and discussed with patient certain preventive protocols, quality metrics, and best practice recommendations. A written personalized care plan for preventive services as well as general preventive health recommendations were provided to patient.   Kerie Badger R Zakaiya Lares, FNP   11/23/2024   Return in 2 months (on 01/21/2025) for DM, HTN.  After Visit Summary: (In Person-Printed) AVS printed and given to the patient  Nurse Notes:   Complete physical exam  Patient: Jared [name]   DOB: 1966-02-28  59 y.o. Male  MRN: [id number]  Subjective:    Chief Complaint  Patient presents with   Medicare Wellness   Discussed the use of AI scribe software for clinical note transcription with the patient, who gave verbal consent to proceed.  History of Present Illness Jared [name] is a 59 year old male who presents for an annual physical exam.  He manages his diabetes with metformin  1000 mg twice daily and glipizide  5 mg daily. He recently restarted Jardiance  after previously stopping it due to feeling ill. He has not picked up his Tradjenta  5mg  tablets prescription from the pharmacy despite it being dispensed. His last A1c was 7.9% a month ago, and he aims to reduce it to below 7%.  For hypertension, he takes lisinopril  20 mg daily. He manages his hyperlipidemia with atorvastatin  10 mg daily in the morning. His cholesterol levels were checked a month ago and were reported to be  good.  He experiences issues with Crohn's disease, including bloating and blockage after taking a stool softener daily, which he has since stopped. Linzess  145 mcg is not strong enough, and he experiences diarrhea without warning when taking it. He is on Skyrizi  injections but is unsure of its effectiveness. He has not seen his gastroenterologist recently as the previous  one retired.  He smokes about five cigarettes a day and has stopped using nicotine  patches due to side effects.  He follows a diet that excludes red meat, focusing on chicken and rice, and avoids sugary sodas and candies. He consumes bread only occasionally during breakfast.  He experiences urinary leakage that started last week, occurring throughout the day, and associates it with a recent intake of sugar-free ginger ale.  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 He walks around the house for physical activity and cuts grass in the summer. He reports difficulty sleeping more than five hours a night.   Assessment & Plan     Most recent fall risk assessment:    11/23/2024    7:55 AM  Fall Risk   Falls in the past year? 0  Number falls in past yr: 0  Injury with Fall? 0  Risk for fall due to : No Fall Risks  Follow up Falls evaluation completed     Most recent depression screenings:    11/23/2024    7:55 AM 10/01/2024    9:28 AM  PHQ 2/9 Scores  PHQ - 2 Score 0 0  PHQ- 9 Score 0 0        Patient Care Team: Selicia Windom R, FNP as PCP - General (Nurse Practitioner) Valdemar Rogue, MD as Consulting Physician (Ophthalmology)   Show/hide medication list[1]  Review of Systems  Constitutional:  Negative for appetite change, chills, fatigue and fever.  HENT:  Negative for congestion, postnasal drip, rhinorrhea and sneezing.   Eyes:  Negative for pain, discharge and itching.  Respiratory:  Negative for cough, shortness of breath and wheezing.   Cardiovascular:  Negative for chest pain, palpitations and leg swelling.  Gastrointestinal:  Positive for constipation. Negative for abdominal pain, nausea and vomiting.  Endocrine: Negative for cold intolerance, heat intolerance and polydipsia.  Genitourinary:  Positive for frequency. Negative for difficulty urinating, dysuria and flank pain.  Musculoskeletal:  Negative for  arthralgias, back pain, joint swelling and myalgias.  Skin:  Negative for color change, pallor, rash and wound.  Neurological:  Negative for dizziness, facial asymmetry, weakness, numbness and headaches.  Psychiatric/Behavioral:  Negative for behavioral problems, confusion, self-injury and suicidal ideas.        Objective:     BP (!) 118/57   Pulse 79   Ht 6' 2 (1.88 m)   Wt 213 lb (96.6 kg)   SpO2 100%   BMI 27.35 kg/m    Physical Exam Vitals and nursing note reviewed.  Constitutional:      General: He is not in acute distress.    Appearance: Normal appearance. He is not ill-appearing, toxic-appearing or diaphoretic.  HENT:     Right Ear: Tympanic membrane, ear canal and external ear normal. There is no impacted cerumen.     Left Ear: Tympanic membrane, ear canal and external ear normal. There is no impacted cerumen.     Nose: Nose normal. No congestion or rhinorrhea.     Mouth/Throat:     Mouth: Mucous membranes are moist.     Pharynx: Oropharynx is clear. No oropharyngeal exudate or posterior oropharyngeal erythema.  Eyes:     General: No scleral icterus.       Right eye: No discharge.        Left eye: No discharge.     Extraocular Movements: Extraocular movements intact.     Conjunctiva/sclera: Conjunctivae normal.  Neck:     Vascular: No carotid bruit.  Cardiovascular:     Rate and Rhythm: Normal rate and regular rhythm.     Pulses: Normal pulses.     Heart sounds: Normal heart sounds. No murmur heard.    No friction rub. No gallop.  Pulmonary:     Effort: Pulmonary effort is normal. No respiratory distress.     Breath sounds: Normal breath sounds. No stridor. No wheezing, rhonchi or rales.  Chest:     Chest wall: No tenderness.  Abdominal:     General: Bowel sounds are normal. There is no distension.     Palpations: Abdomen is soft. There is no mass.     Tenderness: There is no abdominal tenderness. There is no right CVA tenderness, left CVA tenderness,  guarding or rebound.     Hernia: No hernia is present.  Musculoskeletal:        General: No swelling, tenderness, deformity or signs of injury.     Cervical back: Normal range of motion and neck supple. No rigidity or tenderness.     Right lower leg: No edema.     Left lower leg: No edema.  Lymphadenopathy:     Cervical: No cervical adenopathy.  Skin:    General: Skin is warm and dry.     Capillary Refill: Capillary refill takes less than 2 seconds.     Coloration: Skin is not jaundiced or pale.     Findings: No bruising, erythema, lesion or rash.  Neurological:     Mental Status: He is alert and oriented to person, place, and time.     Cranial Nerves: No cranial nerve deficit.     Motor: No weakness.     Gait: Gait normal.  Psychiatric:        Mood and Affect: Mood normal.        Behavior: Behavior normal.        Thought Content: Thought content normal.        Judgment: Judgment normal.     No results found for any visits on 11/23/24.     Assessment & Plan:    Routine Health Maintenance and Physical Exam  Immunization History  Administered Date(s) Administered   PFIZER(Purple  Top)SARS-COV-2 Vaccination 12/01/2020   PPD Test 01/24/2012   Pneumococcal Polysaccharide-23 06/27/2014   Tdap 06/27/2014, 04/14/2020, 04/24/2024    Health Maintenance  Topic Date Due   Hepatitis B Vaccines 19-59 Average Risk (1 of 3 - 19+ 3-dose series) Never done   Pneumococcal Vaccine: 50+ Years (2 of 2 - PCV) 06/28/2015   Zoster Vaccines- Shingrix (1 of 2) Never done   COVID-19 Vaccine (2 - 2025-26 season) 07/16/2024   Influenza Vaccine  02/12/2025 (Originally 06/15/2024)   FOOT EXAM  01/17/2025   HEMOGLOBIN A1C  03/31/2025   OPHTHALMOLOGY EXAM  08/21/2025   Diabetic kidney evaluation - eGFR measurement  09/24/2025   Diabetic kidney evaluation - Urine ACR  10/01/2025   Medicare Annual Wellness (AWV)  11/23/2025   Colonoscopy  01/16/2029   DTaP/Tdap/Td (4 - Td or Tdap) 04/24/2034    Hepatitis C Screening  Completed   HIV Screening  Completed   HPV VACCINES  Aged Out   Meningococcal B Vaccine  Aged Out    Discussed health benefits of physical activity, and encouraged him to engage in regular exercise appropriate for his age and condition.  Problem List Items Addressed This Visit       Cardiovascular and Mediastinum   Essential hypertension   BP Readings from Last 3 Encounters:  11/23/24 (!) 118/57  10/01/24 125/74  09/24/24 (!) 150/78   Essential hypertension Well-controlled with lisinopril  20 mg daily. - Continue lisinopril  20 mg daily.        Relevant Orders   Basic Metabolic Panel     Digestive   Crohn's disease (HCC)   Crohn's disease with constipation Reports bloating and right-sided abdominal pain. Linzess  145 mcg daily ineffective. Concerns about diarrhea. No recent gastroenterologist follow-up. - Continue Linzess  145 mcg daily at bedtime. - Schedule appointment with gastroenterologist. - Ensure adequate hydration and dietary fiber intake.         Endocrine   Type 2 diabetes mellitus without complication, without long-term current use of insulin  (HCC)   Type 2 diabetes mellitus A1c at 7.9%. Restarted Jardiance  10 mg daily. Tradjenta  not picked up. A1c goal <7%. - Pick up Tradjenta   from pharmacy.  Take 5 mg daily - Continue metformin  1000 mg twice daily and glipizide  5 mg daily. - Monitor blood glucose levels before meals.  CBG goals discussed - Recheck A1c at next visit        Relevant Medications   empagliflozin  (JARDIANCE ) 10 MG TABS tablet     Other   Tobacco use disorder    Smokes five cigarettes daily. Previous nicotine  patches discontinued due to side effects. Discussed risks of COPD, lung cancer, and heart disease. Interested in alternative cessation methods. But does not want medication  Educational material provided      Dyslipidemia, goal LDL below 70   Lab Results  Component Value Date   CHOL 90 (L) 01/18/2024    HDL 31 (L) 01/18/2024   LDLCALC 44 01/18/2024   LDLDIRECT 54 10/01/2024   TRIG 65 01/18/2024   CHOLHDL 2.9 01/18/2024    Managed with atorvastatin  10 mg daily. Recent cholesterol levels good. - Continue atorvastatin  10 mg daily.  s.      Constipation   Continue Linzess  145 mcg daily Follow-up with GI      Annual physical exam - Primary     General Health Maintenance Discussed heart-healthy diet, regular exercise, adequate sleep, seatbelt use, and firearm safety. - Encouraged 30 minutes of moderate to vigorous exercise five days a  week. - Encouraged a heart-healthy, low salt, low fat diet. - Encouraged adequate hydration with at least 64 ounces of water daily. - Encouraged 7-8 hours of sleep nightly. - Encouraged seatbelt use for safety. - Recommended shingles, pneumonia, and hepatitis B vaccine      Encounter for Medicare annual wellness exam   Other Visit Diagnoses       Screening for prostate cancer       Relevant Orders   PSA      Return in 2 months (on 01/21/2025) for DM, HTN.     Jaylin Roundy R Aizza Santiago, FNP     [1]  Outpatient Medications Prior to Visit  Medication Sig   atorvastatin  (LIPITOR) 10 MG tablet Take 1 tablet (10 mg total) by mouth daily.   docusate sodium  (COLACE) 250 MG capsule Take 1 capsule (250 mg total) by mouth daily as needed for constipation.   empagliflozin  (JARDIANCE ) 10 MG TABS tablet Take by mouth daily.   glipiZIDE  (GLUCOTROL  XL) 5 MG 24 hr tablet Take 1 tablet (5 mg total) by mouth daily with breakfast.   hydrOXYzine  (ATARAX ) 10 MG tablet Take 1 tablet (10 mg total) by mouth 3 (three) times daily as needed.   ibuprofen  (ADVIL ) 800 MG tablet Take 1 tablet (800 mg total) by mouth every 8 (eight) hours as needed.   lisinopril  (ZESTRIL ) 20 MG tablet Take 1 tablet (20 mg total) by mouth daily.   meclizine  (ANTIVERT ) 25 MG tablet Take 1 tablet (25 mg total) by mouth 3 (three) times daily as needed for dizziness.   metFORMIN  (GLUCOPHAGE )  1000 MG tablet Take 1 tablet (1,000 mg total) by mouth 2 (two) times daily with a meal.   Risankizumab -rzaa (SKYRIZI ) 360 MG/2.4ML SOCT Inject 360 mg into the skin every 8 (eight) weeks.   linaclotide  (LINZESS ) 145 MCG CAPS capsule Take 1 capsule (145 mcg total) by mouth daily before breakfast. (Patient not taking: Reported on 11/23/2024)   linagliptin  (TRADJENTA ) 5 MG TABS tablet Take 1 tablet (5 mg total) by mouth daily. (Patient not taking: Reported on 11/23/2024)   nicotine  (NICODERM CQ  - DOSED IN MG/24 HOURS) 14 mg/24hr patch Place 1 patch (14 mg total) onto the skin daily. (Patient not taking: Reported on 11/23/2024)   No facility-administered medications prior to visit.   "

## 2024-11-23 NOTE — Assessment & Plan Note (Signed)
 BP Readings from Last 3 Encounters:  11/23/24 (!) 118/57  10/01/24 125/74  09/24/24 (!) 150/78   Essential hypertension Well-controlled with lisinopril  20 mg daily. - Continue lisinopril  20 mg daily.

## 2024-11-23 NOTE — Assessment & Plan Note (Signed)
" ° °  General Health Maintenance Discussed heart-healthy diet, regular exercise, adequate sleep, seatbelt use, and firearm safety. - Encouraged 30 minutes of moderate to vigorous exercise five days a week. - Encouraged a heart-healthy, low salt, low fat diet. - Encouraged adequate hydration with at least 64 ounces of water daily. - Encouraged 7-8 hours of sleep nightly. - Encouraged seatbelt use for safety. - Recommended shingles, pneumonia, and hepatitis B vaccine "

## 2024-11-23 NOTE — Assessment & Plan Note (Signed)
 Type 2 diabetes mellitus A1c at 7.9%. Restarted Jardiance  10 mg daily. Tradjenta  not picked up. A1c goal <7%. - Pick up Tradjenta   from pharmacy.  Take 5 mg daily - Continue metformin  1000 mg twice daily and glipizide  5 mg daily. - Monitor blood glucose levels before meals.  CBG goals discussed - Recheck A1c at next visit

## 2024-11-23 NOTE — Assessment & Plan Note (Signed)
" °  Smokes five cigarettes daily. Previous nicotine  patches discontinued due to side effects. Discussed risks of COPD, lung cancer, and heart disease. Interested in alternative cessation methods. But does not want medication  Educational material provided "

## 2024-11-23 NOTE — Assessment & Plan Note (Signed)
 Continue Linzess  145 mcg daily Follow-up with GI

## 2024-11-23 NOTE — Assessment & Plan Note (Signed)
 Crohn's disease with constipation Reports bloating and right-sided abdominal pain. Linzess  145 mcg daily ineffective. Concerns about diarrhea. No recent gastroenterologist follow-up. - Continue Linzess  145 mcg daily at bedtime. - Schedule appointment with gastroenterologist. - Ensure adequate hydration and dietary fiber intake.

## 2024-11-23 NOTE — Patient Instructions (Signed)
" °  [name],  Thank you for taking the time for your Medicare Wellness Visit. I appreciate your continued commitment to your health goals. Please review the care plan we discussed, and feel free to reach out if I can assist you further.  Please note that Annual Wellness Visits do not include a physical exam. Some assessments may be limited, especially if the visit was conducted virtually. If needed, we may recommend an in-person follow-up with your provider.  Ongoing Care Seeing your primary care provider every 3 to 6 months helps us  monitor your health and provide consistent, personalized care.   Referrals If a referral was made during today's visit and you haven't received any updates within two weeks, please contact the referred provider directly to check on the status.  Recommended Screenings:  Health Maintenance  Topic Date Due   Hepatitis B Vaccine (1 of 3 - 19+ 3-dose series) Never done   Pneumococcal Vaccine for age over 35 (2 of 2 - PCV) 06/28/2015   Zoster (Shingles) Vaccine (1 of 2) Never done   Medicare Annual Wellness Visit  01/27/2024   COVID-19 Vaccine (2 - 2025-26 season) 07/16/2024   Flu Shot  02/12/2025*   Complete foot exam   01/17/2025   Hemoglobin A1C  03/31/2025   Eye exam for diabetics  08/21/2025   Yearly kidney function blood test for diabetes  09/24/2025   Yearly kidney health urinalysis for diabetes  10/01/2025   Colon Cancer Screening  01/16/2029   DTaP/Tdap/Td vaccine (4 - Td or Tdap) 04/24/2034   Hepatitis C Screening  Completed   HIV Screening  Completed   HPV Vaccine  Aged Out   Meningitis B Vaccine  Aged Out  *Topic was postponed. The date shown is not the original due date.       09/24/2024    7:45 PM  Advanced Directives  Does Patient Have a Medical Advance Directive? No    Vision: Annual vision screenings are recommended for early detection of glaucoma, cataracts, and diabetic retinopathy. These exams can also reveal signs of chronic  conditions such as diabetes and high blood pressure.  Dental: Annual dental screenings help detect early signs of oral cancer, gum disease, and other conditions linked to overall health, including heart disease and diabetes.      Goal for fasting blood sugar ranges from 80 to 120 and 2 hours after any meal or at bedtime should be between 130 to 170.   It is important that you exercise regularly at least 30 minutes 5 times a week as tolerated  Think about what you will eat, plan ahead. Choose  clean, green, fresh or frozen over canned, processed or packaged foods which are more sugary, salty and fatty. 70 to 75% of food eaten should be vegetables and fruit. Three meals at set times with snacks allowed between meals, but they must be fruit or vegetables. Aim to eat over a 12 hour period , example 7 am to 7 pm, and STOP after  your last meal of the day. Drink water,generally about 64 ounces per day, no other drink is as healthy. Fruit juice is best enjoyed in a healthy way, by EATING the fruit.  Thanks for choosing [facility] we consider it a privelige to serve you.  "

## 2024-11-24 ENCOUNTER — Ambulatory Visit: Payer: Self-pay | Admitting: Nurse Practitioner

## 2024-11-24 LAB — BASIC METABOLIC PANEL WITH GFR
BUN/Creatinine Ratio: 10 (ref 9–20)
BUN: 12 mg/dL (ref 6–24)
CO2: 21 mmol/L (ref 20–29)
Calcium: 9.5 mg/dL (ref 8.7–10.2)
Chloride: 105 mmol/L (ref 96–106)
Creatinine, Ser: 1.15 mg/dL (ref 0.76–1.27)
Glucose: 125 mg/dL — ABNORMAL HIGH (ref 70–99)
Potassium: 4.4 mmol/L (ref 3.5–5.2)
Sodium: 143 mmol/L (ref 134–144)
eGFR: 74 mL/min/1.73

## 2024-11-24 LAB — PSA: Prostate Specific Ag, Serum: 1.7 ng/mL (ref 0.0–4.0)

## 2024-12-13 ENCOUNTER — Telehealth: Payer: Self-pay | Admitting: Gastroenterology

## 2024-12-13 NOTE — Telephone Encounter (Signed)
 I have spoken to patient to advise that he needs a follow up office visit for his Crohns disease (last seen 07/2023) in order for us  to be able to continue getting Skyrizi  approval through insurance. Patient has scheduled an appointment with Ellouise Console, PA-C on 12/26/24 at 130 pm.

## 2024-12-13 NOTE — Telephone Encounter (Signed)
 Inbound call from [facility] stating they will need patients most recents triad notes for medication skyrizi . Pharmacy stating patient is almost due for a refill. Good call back number [phone]  Or fax number [phone]  Please advise  Thank you

## 2024-12-25 ENCOUNTER — Ambulatory Visit: Admitting: Physician Assistant

## 2025-01-21 ENCOUNTER — Ambulatory Visit: Payer: Self-pay | Admitting: Nurse Practitioner

## 2025-05-21 ENCOUNTER — Encounter (INDEPENDENT_AMBULATORY_CARE_PROVIDER_SITE_OTHER): Admitting: Ophthalmology
